# Patient Record
Sex: Female | Born: 1945
Health system: Southern US, Community
[De-identification: ages and names within clinical notes are randomized; demographics above are authoritative.]

## PROBLEM LIST (undated history)

## (undated) DIAGNOSIS — H269 Unspecified cataract: Secondary | ICD-10-CM

## (undated) DIAGNOSIS — G4730 Sleep apnea, unspecified: Secondary | ICD-10-CM

## (undated) DIAGNOSIS — E559 Vitamin D deficiency, unspecified: Secondary | ICD-10-CM

## (undated) DIAGNOSIS — E039 Hypothyroidism, unspecified: Secondary | ICD-10-CM

## (undated) DIAGNOSIS — I8390 Asymptomatic varicose veins of unspecified lower extremity: Secondary | ICD-10-CM

## (undated) DIAGNOSIS — K649 Unspecified hemorrhoids: Secondary | ICD-10-CM

## (undated) DIAGNOSIS — M1990 Unspecified osteoarthritis, unspecified site: Secondary | ICD-10-CM

## (undated) DIAGNOSIS — D649 Anemia, unspecified: Secondary | ICD-10-CM

## (undated) DIAGNOSIS — I10 Essential (primary) hypertension: Secondary | ICD-10-CM

## (undated) DIAGNOSIS — E785 Hyperlipidemia, unspecified: Secondary | ICD-10-CM

## (undated) DIAGNOSIS — K253 Acute gastric ulcer without hemorrhage or perforation: Secondary | ICD-10-CM

## (undated) DIAGNOSIS — Z8679 Personal history of other diseases of the circulatory system: Secondary | ICD-10-CM

## (undated) DIAGNOSIS — F32A Depression, unspecified: Secondary | ICD-10-CM

## (undated) DIAGNOSIS — Z8711 Personal history of peptic ulcer disease: Secondary | ICD-10-CM

## (undated) DIAGNOSIS — T4145XA Adverse effect of unspecified anesthetic, initial encounter: Secondary | ICD-10-CM

## (undated) DIAGNOSIS — M2550 Pain in unspecified joint: Secondary | ICD-10-CM

## (undated) DIAGNOSIS — R609 Edema, unspecified: Secondary | ICD-10-CM

## (undated) DIAGNOSIS — M549 Dorsalgia, unspecified: Secondary | ICD-10-CM

## (undated) DIAGNOSIS — K219 Gastro-esophageal reflux disease without esophagitis: Secondary | ICD-10-CM

## (undated) DIAGNOSIS — Z9289 Personal history of other medical treatment: Secondary | ICD-10-CM

## (undated) DIAGNOSIS — F329 Major depressive disorder, single episode, unspecified: Secondary | ICD-10-CM

## (undated) DIAGNOSIS — T8859XA Other complications of anesthesia, initial encounter: Secondary | ICD-10-CM

## (undated) DIAGNOSIS — R0602 Shortness of breath: Secondary | ICD-10-CM

## (undated) DIAGNOSIS — M810 Age-related osteoporosis without current pathological fracture: Secondary | ICD-10-CM

## (undated) HISTORY — DX: Hypothyroidism, unspecified: E03.9

## (undated) HISTORY — DX: Essential (primary) hypertension: I10

## (undated) HISTORY — DX: Personal history of peptic ulcer disease: Z87.11

## (undated) HISTORY — PX: COLONOSCOPY W/ POLYPECTOMY: SHX1380

## (undated) HISTORY — DX: Edema, unspecified: R60.9

## (undated) HISTORY — DX: Shortness of breath: R06.02

## (undated) HISTORY — DX: Acute gastric ulcer without hemorrhage or perforation: K25.3

## (undated) HISTORY — DX: Unspecified cataract: H26.9

## (undated) HISTORY — DX: Dorsalgia, unspecified: M54.9

## (undated) HISTORY — PX: HAMMER TOE SURGERY: SHX385

## (undated) HISTORY — PX: BIOPSY BREAST: PRO8

## (undated) HISTORY — DX: Vitamin D deficiency, unspecified: E55.9

## (undated) HISTORY — DX: Unspecified hemorrhoids: K64.9

## (undated) HISTORY — PX: TONSILLECTOMY AND ADENOIDECTOMY: SUR1326

## (undated) HISTORY — DX: Sleep apnea, unspecified: G47.30

## (undated) HISTORY — PX: CLOSED REDUCTION TOE FRACTURE: SUR248

## (undated) HISTORY — PX: WISDOM TOOTH EXTRACTION: SHX21

## (undated) HISTORY — PX: JOINT REPLACEMENT: SHX530

## (undated) HISTORY — DX: Personal history of other diseases of the circulatory system: Z86.79

## (undated) HISTORY — DX: Hyperlipidemia, unspecified: E78.5

## (undated) HISTORY — DX: Age-related osteoporosis without current pathological fracture: M81.0

## (undated) HISTORY — DX: Pain in unspecified joint: M25.50

---

## 2002-01-09 ENCOUNTER — Encounter: Admission: RE | Admit: 2002-01-09 | Discharge: 2002-01-09 | Payer: Self-pay | Admitting: Internal Medicine

## 2002-01-09 ENCOUNTER — Encounter: Payer: Self-pay | Admitting: Internal Medicine

## 2005-01-12 ENCOUNTER — Ambulatory Visit: Payer: Self-pay | Admitting: Family Medicine

## 2005-03-13 ENCOUNTER — Ambulatory Visit: Payer: Self-pay | Admitting: Internal Medicine

## 2005-09-13 ENCOUNTER — Ambulatory Visit: Payer: Self-pay | Admitting: Internal Medicine

## 2005-09-21 ENCOUNTER — Ambulatory Visit: Payer: Self-pay | Admitting: Internal Medicine

## 2005-11-16 ENCOUNTER — Ambulatory Visit: Payer: Self-pay | Admitting: Internal Medicine

## 2006-03-09 ENCOUNTER — Ambulatory Visit: Payer: Self-pay | Admitting: Internal Medicine

## 2006-04-03 ENCOUNTER — Ambulatory Visit: Payer: Self-pay | Admitting: Internal Medicine

## 2006-04-09 ENCOUNTER — Ambulatory Visit: Payer: Self-pay | Admitting: Internal Medicine

## 2006-04-09 ENCOUNTER — Encounter (INDEPENDENT_AMBULATORY_CARE_PROVIDER_SITE_OTHER): Payer: Self-pay | Admitting: Specialist

## 2006-05-30 ENCOUNTER — Ambulatory Visit: Payer: Self-pay | Admitting: Internal Medicine

## 2006-11-19 ENCOUNTER — Ambulatory Visit: Payer: Self-pay | Admitting: Internal Medicine

## 2006-11-28 ENCOUNTER — Ambulatory Visit: Payer: Self-pay

## 2006-12-03 ENCOUNTER — Ambulatory Visit: Payer: Self-pay | Admitting: Internal Medicine

## 2006-12-08 ENCOUNTER — Emergency Department (HOSPITAL_COMMUNITY): Admission: EM | Admit: 2006-12-08 | Discharge: 2006-12-08 | Payer: Self-pay | Admitting: Family Medicine

## 2006-12-11 ENCOUNTER — Ambulatory Visit: Payer: Self-pay | Admitting: Internal Medicine

## 2006-12-13 ENCOUNTER — Ambulatory Visit: Payer: Self-pay

## 2006-12-18 ENCOUNTER — Ambulatory Visit: Payer: Self-pay | Admitting: Cardiology

## 2007-01-14 ENCOUNTER — Ambulatory Visit: Payer: Self-pay | Admitting: Internal Medicine

## 2007-01-14 ENCOUNTER — Ambulatory Visit: Payer: Self-pay | Admitting: Cardiology

## 2007-11-20 ENCOUNTER — Ambulatory Visit: Payer: Self-pay | Admitting: Internal Medicine

## 2007-11-20 DIAGNOSIS — K589 Irritable bowel syndrome without diarrhea: Secondary | ICD-10-CM

## 2007-11-20 DIAGNOSIS — Z8679 Personal history of other diseases of the circulatory system: Secondary | ICD-10-CM | POA: Insufficient documentation

## 2007-11-20 DIAGNOSIS — R030 Elevated blood-pressure reading, without diagnosis of hypertension: Secondary | ICD-10-CM

## 2007-11-20 DIAGNOSIS — Z86010 Personal history of colonic polyps: Secondary | ICD-10-CM

## 2007-11-20 DIAGNOSIS — E039 Hypothyroidism, unspecified: Secondary | ICD-10-CM

## 2007-11-25 ENCOUNTER — Encounter (INDEPENDENT_AMBULATORY_CARE_PROVIDER_SITE_OTHER): Payer: Self-pay | Admitting: *Deleted

## 2008-03-23 ENCOUNTER — Telehealth: Payer: Self-pay | Admitting: Internal Medicine

## 2008-10-12 ENCOUNTER — Ambulatory Visit: Payer: Self-pay | Admitting: Internal Medicine

## 2008-11-06 ENCOUNTER — Ambulatory Visit: Payer: Self-pay | Admitting: Family Medicine

## 2008-11-06 DIAGNOSIS — R21 Rash and other nonspecific skin eruption: Secondary | ICD-10-CM

## 2008-11-20 ENCOUNTER — Ambulatory Visit: Payer: Self-pay | Admitting: Family Medicine

## 2008-11-30 ENCOUNTER — Ambulatory Visit: Payer: Self-pay | Admitting: Family Medicine

## 2008-12-01 ENCOUNTER — Telehealth (INDEPENDENT_AMBULATORY_CARE_PROVIDER_SITE_OTHER): Payer: Self-pay | Admitting: *Deleted

## 2009-03-03 ENCOUNTER — Encounter (INDEPENDENT_AMBULATORY_CARE_PROVIDER_SITE_OTHER): Payer: Self-pay | Admitting: *Deleted

## 2009-03-05 ENCOUNTER — Ambulatory Visit: Payer: Self-pay | Admitting: Internal Medicine

## 2009-03-05 DIAGNOSIS — H6980 Other specified disorders of Eustachian tube, unspecified ear: Secondary | ICD-10-CM

## 2009-03-05 DIAGNOSIS — K219 Gastro-esophageal reflux disease without esophagitis: Secondary | ICD-10-CM

## 2009-03-19 ENCOUNTER — Encounter (INDEPENDENT_AMBULATORY_CARE_PROVIDER_SITE_OTHER): Payer: Self-pay | Admitting: *Deleted

## 2009-03-23 ENCOUNTER — Ambulatory Visit: Payer: Self-pay | Admitting: Internal Medicine

## 2009-03-23 DIAGNOSIS — M25569 Pain in unspecified knee: Secondary | ICD-10-CM | POA: Insufficient documentation

## 2009-05-06 ENCOUNTER — Ambulatory Visit: Payer: Self-pay | Admitting: Internal Medicine

## 2009-05-07 ENCOUNTER — Ambulatory Visit: Payer: Self-pay | Admitting: Internal Medicine

## 2009-05-08 LAB — CONVERTED CEMR LAB: TSH: 1.51 microintl units/mL (ref 0.35–5.50)

## 2009-05-10 ENCOUNTER — Encounter (INDEPENDENT_AMBULATORY_CARE_PROVIDER_SITE_OTHER): Payer: Self-pay | Admitting: *Deleted

## 2009-05-21 ENCOUNTER — Encounter: Payer: Self-pay | Admitting: Internal Medicine

## 2009-05-21 ENCOUNTER — Ambulatory Visit: Payer: Self-pay | Admitting: Internal Medicine

## 2009-05-27 ENCOUNTER — Encounter: Payer: Self-pay | Admitting: Internal Medicine

## 2009-08-12 ENCOUNTER — Ambulatory Visit: Payer: Self-pay | Admitting: Family Medicine

## 2009-10-11 ENCOUNTER — Ambulatory Visit: Payer: Self-pay | Admitting: Internal Medicine

## 2009-10-15 LAB — CONVERTED CEMR LAB: TSH: 0.08 microintl units/mL — ABNORMAL LOW (ref 0.35–5.50)

## 2009-10-18 ENCOUNTER — Encounter (INDEPENDENT_AMBULATORY_CARE_PROVIDER_SITE_OTHER): Payer: Self-pay | Admitting: *Deleted

## 2010-02-21 ENCOUNTER — Ambulatory Visit: Payer: Self-pay | Admitting: Internal Medicine

## 2010-03-01 ENCOUNTER — Ambulatory Visit: Payer: Self-pay | Admitting: Internal Medicine

## 2010-03-01 DIAGNOSIS — N959 Unspecified menopausal and perimenopausal disorder: Secondary | ICD-10-CM | POA: Insufficient documentation

## 2010-08-25 ENCOUNTER — Ambulatory Visit: Payer: Self-pay | Admitting: Internal Medicine

## 2010-08-25 DIAGNOSIS — J019 Acute sinusitis, unspecified: Secondary | ICD-10-CM | POA: Insufficient documentation

## 2010-08-26 ENCOUNTER — Telehealth (INDEPENDENT_AMBULATORY_CARE_PROVIDER_SITE_OTHER): Payer: Self-pay | Admitting: *Deleted

## 2010-11-10 ENCOUNTER — Encounter: Payer: Self-pay | Admitting: Internal Medicine

## 2010-11-20 LAB — CONVERTED CEMR LAB
ALT: 17 units/L (ref 0–35)
ALT: 20 units/L (ref 0–35)
ALT: 20 units/L (ref 0–40)
AST: 21 units/L (ref 0–37)
AST: 23 units/L (ref 0–37)
AST: 24 units/L (ref 0–37)
Albumin: 3.9 g/dL (ref 3.5–5.2)
Albumin: 4 g/dL (ref 3.5–5.2)
Alkaline Phosphatase: 62 units/L (ref 39–117)
Alkaline Phosphatase: 67 units/L (ref 39–117)
BUN: 12 mg/dL (ref 6–23)
BUN: 17 mg/dL (ref 6–23)
BUN: 19 mg/dL (ref 6–23)
Basophils Absolute: 0 10*3/uL (ref 0.0–0.1)
Basophils Absolute: 0.1 10*3/uL (ref 0.0–0.1)
Basophils Relative: 0.6 % (ref 0.0–1.0)
Basophils Relative: 0.9 % (ref 0.0–3.0)
Bilirubin, Direct: 0.1 mg/dL (ref 0.0–0.3)
Bilirubin, Direct: 0.1 mg/dL (ref 0.0–0.3)
CO2: 31 meq/L (ref 19–32)
CO2: 32 meq/L (ref 19–32)
Calcium: 9.1 mg/dL (ref 8.4–10.5)
Calcium: 9.6 mg/dL (ref 8.4–10.5)
Chloride: 105 meq/L (ref 96–112)
Chloride: 106 meq/L (ref 96–112)
Cholesterol: 187 mg/dL (ref 0–200)
Cholesterol: 197 mg/dL (ref 0–200)
Creatinine, Ser: 0.9 mg/dL (ref 0.4–1.2)
Creatinine, Ser: 1 mg/dL (ref 0.4–1.2)
Creatinine, Ser: 1.1 mg/dL (ref 0.4–1.2)
Eosinophils Absolute: 0.2 10*3/uL (ref 0.0–0.6)
Eosinophils Absolute: 0.3 10*3/uL (ref 0.0–0.7)
Eosinophils Relative: 3.5 % (ref 0.0–5.0)
Eosinophils Relative: 5.4 % — ABNORMAL HIGH (ref 0.0–5.0)
GFR calc Af Amer: 65 mL/min
GFR calc non Af Amer: 54 mL/min
GFR calc non Af Amer: 59.96 mL/min (ref >60)
Glucose, Bld: 83 mg/dL (ref 70–99)
Glucose, Bld: 86 mg/dL (ref 70–99)
Glucose, Bld: 96 mg/dL (ref 70–99)
HCT: 40.5 % (ref 36.0–46.0)
HCT: 41.3 % (ref 36.0–46.0)
HDL: 53.7 mg/dL (ref >39.0)
HDL: 61.7 mg/dL (ref >39.00)
Hemoglobin: 13.6 g/dL (ref 12.0–15.0)
Hemoglobin: 14 g/dL (ref 12.0–15.0)
LDL Cholesterol: 116 mg/dL — ABNORMAL HIGH (ref 0–99)
LDL Cholesterol: 121 mg/dL — ABNORMAL HIGH (ref 0–99)
Lymphocytes Relative: 23.5 % (ref 12.0–46.0)
Lymphocytes Relative: 25.2 % (ref 12.0–46.0)
Lymphs Abs: 1.6 10*3/uL (ref 0.7–4.0)
MCHC: 33.7 g/dL (ref 30.0–36.0)
MCHC: 33.9 g/dL (ref 30.0–36.0)
MCV: 89.4 fL (ref 78.0–100.0)
MCV: 89.6 fL (ref 78.0–100.0)
Monocytes Absolute: 0.4 10*3/uL (ref 0.2–0.7)
Monocytes Absolute: 0.6 10*3/uL (ref 0.1–1.0)
Monocytes Relative: 7.7 % (ref 3.0–11.0)
Monocytes Relative: 9.7 % (ref 3.0–12.0)
Neutro Abs: 3.3 10*3/uL (ref 1.4–7.7)
Neutro Abs: 3.7 10*3/uL (ref 1.4–7.7)
Neutrophils Relative %: 58.8 % (ref 43.0–77.0)
Neutrophils Relative %: 64.7 % (ref 43.0–77.0)
Platelets: 164 10*3/uL (ref 150.0–400.0)
Platelets: 168 10*3/uL (ref 150–400)
Potassium: 4.3 meq/L (ref 3.5–5.1)
Potassium: 4.3 meq/L (ref 3.5–5.1)
Potassium: 4.7 meq/L (ref 3.5–5.1)
RBC: 4.52 M/uL (ref 3.87–5.11)
RBC: 4.62 M/uL (ref 3.87–5.11)
RDW: 12.9 % (ref 11.5–14.6)
RDW: 13 % (ref 11.5–14.6)
Sodium: 141 meq/L (ref 135–145)
Sodium: 142 meq/L (ref 135–145)
TSH: 1.06 microintl units/mL (ref 0.35–5.50)
TSH: 4.42 microintl units/mL (ref 0.35–5.50)
TSH: 8.02 microintl units/mL — ABNORMAL HIGH (ref 0.35–5.50)
Total Bilirubin: 0.7 mg/dL (ref 0.3–1.2)
Total Bilirubin: 0.9 mg/dL (ref 0.3–1.2)
Total CHOL/HDL Ratio: 3
Total CHOL/HDL Ratio: 3.5
Total Protein: 7 g/dL (ref 6.0–8.3)
Total Protein: 7.3 g/dL (ref 6.0–8.3)
Triglycerides: 73 mg/dL (ref 0.0–149.0)
Triglycerides: 88 mg/dL (ref 0–149)
VLDL: 14.6 mg/dL (ref 0.0–40.0)
VLDL: 18 mg/dL (ref 0–40)
WBC: 5.1 10*3/uL (ref 4.5–10.5)
WBC: 6.3 10*3/uL (ref 4.5–10.5)

## 2010-11-22 NOTE — Progress Notes (Signed)
Summary: resend prescription for Hydromet  Phone Note Refill Request Message from:  Rocky Link on August 26, 2010 12:49 PM  Refills Requested: Medication #1:  HYDROMET 5-1.5 MG/5ML SYRP 1 tsp every 6 hrs as needed. patient says pharmacy did not get this prescription (they did  get prescription for Clarithromycin and patient has picked that prescription up)---[facility]  Initial call taken by: Jerolyn Shin,  August 26, 2010 12:50 PM    Prescriptions: HYDROMET 5-1.5 MG/5ML SYRP (HYDROCODONE-HOMATROPINE) 1 tsp every 6 hrs as needed  #120cc x 0   Entered by:   Shonna Chock CMA   Authorized by:   Marga Melnick MD   Signed by:   Shonna Chock CMA on 08/26/2010   Method used:   Telephoned to ...       [facility] [phone][facility] (retail)       [address]       [place], [place]  [address]       Ph: [phone]       Fax: [phone]   RxID:   [id number]

## 2010-11-22 NOTE — Assessment & Plan Note (Signed)
Summary: bad cold//ph   Vital Signs:  Patient profile:   65 year old female Height:      63.25 inches Weight:      198.2 pounds BMI:     34.96 Temp:     98.0 degrees F oral Pulse rate:   64 / minute Resp:     16 per minute BP sitting:   118 / 80  (left arm) Cuff size:   large  Vitals Entered By: Shonna Chock CMA (August 25, 2010 10:33 AM) CC: Cold sympotoms since last Friday, URI symptoms   Primary Care Provider:  Alwyn Ren  CC:  Cold sympotoms since last Friday and URI symptoms.  History of Present Illness: Onset 08/19/2010 as  nasal  congestion & malaise; cough started 10/31.   The patient reports nasal congestion, purulent nasal discharge,  minor sore throat, and productive cough, but denies earache.  The patient denies fever( but isolated chills), dyspnea, and wheezing.  The patient denies headache.  The patient denies the following risk factors for Strep sinusitis: unilateral facial pain, tooth pain, and tender adenopathy.  Rx: Robitussin   Current Medications (verified): 1)  Levothyroxine Sodium 112 Mcg .Marland Kitchen.. 1 By Mouth Once Daily Except 1&1/2 Weds Only 2)  Atenolol 25 Mg Tabs (Atenolol) .Marland Kitchen.. 1 By Mouth Once Daily  Fax # [phone] 3)  Celexa 20 Mg  Tabs (Citalopram Hydrobromide) .Marland Kitchen.. 1 1/2 By Mouth Once Daily  Allergies: 1)  ! Pcn 2)  ! Ceclor  Physical Exam  General:  well-nourished,in no acute distress; alert,appropriate and cooperative throughout examination Ears:  External ear exam shows no significant lesions or deformities.  Otoscopic examination reveals clear canals, tympanic membranes are intact bilaterally without bulging, retraction, inflammation or discharge. Hearing is grossly normal bilaterally. Nose:  External nasal examination shows no deformity or inflammation. Nasal mucosa are pink and moist without lesions or exudates. Mouth:  Oral mucosa and oropharynx without lesions or exudates.  Teeth in good repair. Minimal  petechial pharyngeal erythema.   Hoarse Lungs:  Normal respiratory effort, chest expands symmetrically. Lungs are clear to auscultation, no crackles or wheezes. Heart:  Normal rate and regular rhythm. S1 and S2 normal without gallop, murmur, click, rub or other extra sounds. Cervical Nodes:  No lymphadenopathy noted Axillary Nodes:  No palpable lymphadenopathy   Impression & Recommendations:  Problem # 1:  SINUSITIS- ACUTE-NOS (ICD-461.9)  Her updated medication list for this problem includes:    Clarithromycin 500 Mg Xr24h-tab (Clarithromycin) .Marland Kitchen... 2 once daily with a meal    Hydromet 5-1.5 Mg/59ml Syrp (Hydrocodone-homatropine) .Marland Kitchen... 1 tsp every 6 hrs as needed  Complete Medication List: 1)  Levothyroxine Sodium 112 Mcg  .Marland KitchenMarland Kitchen. 1 by mouth once daily except 1&1/2 weds only 2)  Atenolol 25 Mg Tabs (Atenolol) .Marland Kitchen.. 1 by mouth once daily  fax # [phone] 3)  Celexa 20 Mg Tabs (Citalopram hydrobromide) .Marland Kitchen.. 1 1/2 by mouth once daily 4)  Clarithromycin 500 Mg Xr24h-tab (Clarithromycin) .... 2 once daily with a meal 5)  Hydromet 5-1.5 Mg/16ml Syrp (Hydrocodone-homatropine) .Marland Kitchen.. 1 tsp every 6 hrs as needed  Patient Instructions: 1)  Neti pot once daily until sinuses are  clear . 2)  Drink as much NON dairy  fluid as you can tolerate for the next few days. Prescriptions: HYDROMET 5-1.5 MG/5ML SYRP (HYDROCODONE-HOMATROPINE) 1 tsp every 6 hrs as needed  #120cc x 0   Entered and Authorized by:   Marga Melnick MD   Signed by:   Marga Melnick MD  on 08/25/2010   Method used:   Printed then faxed to ...       [facility] [phone]* (retail)       [address]       [place], [place]  [address]       Ph: [phone]       Fax: [phone]   RxID:   [id number] CLARITHROMYCIN 500 MG XR24H-TAB (CLARITHROMYCIN) 2 once daily with a meal  #20 x 0   Entered and Authorized by:   Marga Melnick MD   Signed by:   Marga Melnick MD on 08/25/2010   Method used:   Faxed to ...       [facility]  [phone]* (retail)       [address]       [place], [place]  [address]       Ph: [phone]       Fax: [phone]   RxID:   [id number]    Orders Added: 1)  Est. Patient Level III [15176]

## 2010-11-22 NOTE — Assessment & Plan Note (Signed)
Summary: review lab/cbs   Vital Signs:  Patient profile:   65 year old female Weight:      192.4 pounds BMI:     33.67 Pulse rate:   72 / minute Resp:     15 per minute BP sitting:   122 / 80  (left arm) Cuff size:   large  Vitals Entered By: Shonna Chock (Mar 01, 2010 10:38 AM) CC: Follow-up visit: Discuss labs anfd refill Atenolol and Synthroid (local pharmacy) Comments REVIEWED MED LIST, PATIENT AGREED DOSE AND INSTRUCTION CORRECT    Primary Care Provider:  Alwyn Ren  CC:  Follow-up visit: Discuss labs anfd refill Atenolol and Synthroid (local pharmacy).  History of Present Illness: With [employer] she has had 25# weight loss over 10 weeks. TSH: 0.08 in 09/2009; TSH 1.03 on 02/21/2010. Thyroid dose verified as 112 micrograms once daily 1& 1/2 every Weds. Generic form started last week.Not on Ca++ or vitamin D.BMD last done ?  Allergies: 1)  ! Pcn 2)  ! Ceclor  Review of Systems General:  Complains of fatigue; Minor fatigue. CV:  Complains of lightheadness; denies palpitations; Occasional lightheadedness, non postural. No  neuro or cardiac triggers. GI:  Denies constipation and diarrhea. Derm:  Denies changes in nail beds, dryness, and hair loss. Neuro:  Denies numbness and tingling. Endo:  Denies cold intolerance and heat intolerance.  Physical Exam  General:  well-nourished; alert,appropriate and cooperative throughout examination Eyes:  No corneal or conjunctival inflammation noted.No lid lag.Perrla.  Neck:  No deformities, masses, or tenderness noted. Heart:  Normal rate and regular rhythm. S1 and S2 normal without gallop, murmur, click, rub .S4 Extremities:  No clubbing, cyanosis, edema. No nail changes  Neurologic:  alert & oriented X3, gait normal, and DTRs symmetrical and normal.  No tremor Skin:  Intact without suspicious lesions or rashes Psych:  memory intact for recent and remote, normally interactive, and good eye contact.     Impression &  Recommendations:  Problem # 1:  HYPOTHYROIDISM (ICD-244.9)  Her updated medication list for this problem includes:  Levothyroxine sodium...Marland KitchenMarland Kitchen 1 by mouth once daily except 1&1/2 weds only   Complete Medication List: 1)  Synthroid 112 Mcg Tabs (Levothyroxine sodium) .Marland Kitchen.. 1 by mouth once daily except 1&1/2 weds only (labs due) 2)  Atenolol 25 Mg Tabs (Atenolol) .Marland Kitchen.. 1 by mouth once daily  fax # [phone] 3)  Celexa 20 Mg Tabs (Citalopram hydrobromide) .Marland Kitchen.. 1 1/2 by mouth once daily  Patient Instructions: 1)  Calcium 600 mg two times a day & vitamin D3 1000 International Units once daily. Check vitamin D level in 6 months with TSH. BMD every 25 months @ Gyn office. (Codes : 244.9,627.9)

## 2010-11-30 NOTE — Letter (Signed)
Summary: [facility]  [facility]   Imported By: Lanelle Bal 11/23/2010 10:54:28  _____________________________________________________________________  External Attachment:    Type:   Image     Comment:   External Document

## 2011-02-27 ENCOUNTER — Other Ambulatory Visit: Payer: Self-pay

## 2011-02-27 MED ORDER — LEVOTHYROXINE SODIUM 112 MCG PO TABS: 112.0000 ug | ORAL_TABLET | ORAL | Status: DC

## 2011-02-27 NOTE — Telephone Encounter (Signed)
TSH 244.9, VIT D 627.9

## 2011-02-28 ENCOUNTER — Other Ambulatory Visit: Payer: Self-pay

## 2011-02-28 MED ORDER — ATENOLOL 25 MG PO TABS: 25.0000 mg | ORAL_TABLET | Freq: Every day | ORAL | Status: DC

## 2011-03-10 NOTE — Assessment & Plan Note (Signed)
[facility]                                 ON-CALL NOTE   Kristine Olson, Kristine Olson                     MRN:          [id number]  DATE:12/08/2006                            DOB:          [birth year]    CALLER:  Kristine Olson, the daughter.   PRIMARY CARE PHYSICIAN:  Dr. Alwyn Ren.   SUBJECTIVE:  Lightheaded, cold sweats, slight pain in base of neck and  elevated blood pressure, slight shortness of breath.   ASSESSMENT AND PLAN:  Saturday clinic closed.  Go to Urgent Care for  evaluation.  This note was triaged by Saturday clinic nurse.     Kerby Nora, MD  Electronically Signed    AB/MedQ  DD: 12/09/2006  DT: 12/09/2006  Job #: [id number]
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
## 2011-03-10 NOTE — Assessment & Plan Note (Signed)
[facility]                            CARDIOLOGY OFFICE NOTE   Kristine Olson, Kristine Olson                   MRN:          [id number]  DATE:01/14/2007                            DOB:          16-Dec-1945    PRIMARY CARE PHYSICIAN:  Dr. Alwyn Ren.   REASON FOR PRESENTATION:  Presyncope.   HISTORY OF PRESENT ILLNESS:  The patient presented in late February for  evaluation of dizzy episode as described in that note. She wore an event  monitor. She did transmit on February 21. It was a run of atrial  tachycardia. That was the only symptom that she had. She is not sure of  whether it was similar to the episode that she had on January 26. She  had had no presyncope or syncope since then. She has had no chest pain.  She has had only that one episode of palpitations. She has had no  shortness of breath, PND or orthopnea.   PAST MEDICAL HISTORY:  Tonsillectomy, breast biopsy.   ALLERGIES:  PENICILLIN, CECLOR.   MEDICATIONS:  1. Synthroid 112 mcg daily.  2. Atenolol 25 mg daily.  3. Celexa 30 mg daily.   REVIEW OF SYSTEMS:  As stated in the HPI. Otherwise, negative for other  systems.   PHYSICAL EXAMINATION:  GENERAL:  The patient is in no distress.  VITAL SIGNS:  Blood pressure 124/84, heart rate 61 and regular, weight  197 pounds, BMI 34.  HEENT:  Eyes unremarkable. Pupils equal, round, and react to light.  Fundi not visualized. Oral mucosa unremarkable.  NECK:  No jugular venous distention at 45 degrees. Carotid upstroke  brisk and symmetric. No bruits, no thyromegaly.  LYMPHATICS:  No adenopathy.  LUNGS:  Clear to auscultation bilaterally.  BACK:  No costovertebral angle tenderness.  CHEST:  Unremarkable.  HEART:  PMI not displaced or sustained. S1 and S2 within normal limits.  No S3, no S4. No clicks, no rubs, no murmurs.  ABDOMEN:  Flat, positive bowel sounds normal in frequency and pitch. No  bruits, no rebound. No guarding or midline pulsatile  mass. No  organomegaly.  SKIN:  No rashes.  EXTREMITIES:  2+ pulses, no edema.   ASSESSMENT/PLAN:  1. Palpitations. The patient did have documented atrial tachycardia of      about 8 beats. It is not clear to me that these are the symptoms      that she had in late January. Regardless, she is not having any      further presyncope and has had no syncope. She is not having any      palpitations. She has had normal electrolytes. She had normal      stress test. At this point no further cardiovascular testing is      suggested unless she has recurrent symptoms. She and I discussed      this at length.  2. Followup will be as needed.     Rollene Rotunda, MD, [facility]  Electronically Signed    JH/MedQ  DD: 01/14/2007  DT: 01/14/2007  Job #: [id number]   cc:   Titus Dubin. Alwyn Ren, MD,FACP,FCCP

## 2011-03-28 ENCOUNTER — Telehealth: Payer: Self-pay | Admitting: Internal Medicine

## 2011-03-28 MED ORDER — LEVOTHYROXINE SODIUM 112 MCG PO TABS: 112.0000 ug | ORAL_TABLET | ORAL | Status: DC

## 2011-03-28 NOTE — Telephone Encounter (Signed)
Please contact patient for Lab appointment TSH 244.9, this is necessary to continue refilling thyroid meds. Patient should also consider scheduling a CPX (yearly follow-up)

## 2011-04-05 NOTE — Telephone Encounter (Signed)
Patient has lab appt for TSH on 04/07/2011     and CPX for 07/06/2011

## 2011-04-07 ENCOUNTER — Other Ambulatory Visit: Payer: Self-pay

## 2011-05-02 ENCOUNTER — Other Ambulatory Visit: Payer: Self-pay | Admitting: Internal Medicine

## 2011-05-02 MED ORDER — LEVOTHYROXINE SODIUM 112 MCG PO TABS: 112.0000 ug | ORAL_TABLET | ORAL | Status: DC

## 2011-05-02 NOTE — Telephone Encounter (Signed)
rx sent to pharmacy

## 2011-07-06 ENCOUNTER — Ambulatory Visit (INDEPENDENT_AMBULATORY_CARE_PROVIDER_SITE_OTHER): Payer: BC Managed Care – PPO | Admitting: Internal Medicine

## 2011-07-06 ENCOUNTER — Encounter: Payer: Self-pay | Admitting: Internal Medicine

## 2011-07-06 VITALS — BP 122/86 | HR 75 | Temp 98.4°F | Resp 12 | Ht 63.25 in | Wt 209.8 lb

## 2011-07-06 DIAGNOSIS — E039 Hypothyroidism, unspecified: Secondary | ICD-10-CM

## 2011-07-06 DIAGNOSIS — K589 Irritable bowel syndrome without diarrhea: Secondary | ICD-10-CM

## 2011-07-06 DIAGNOSIS — E785 Hyperlipidemia, unspecified: Secondary | ICD-10-CM | POA: Insufficient documentation

## 2011-07-06 DIAGNOSIS — K219 Gastro-esophageal reflux disease without esophagitis: Secondary | ICD-10-CM

## 2011-07-06 DIAGNOSIS — Z0000 Encounter for general adult medical examination without abnormal findings: Secondary | ICD-10-CM

## 2011-07-06 DIAGNOSIS — R030 Elevated blood-pressure reading, without diagnosis of hypertension: Secondary | ICD-10-CM

## 2011-07-06 DIAGNOSIS — M8580 Other specified disorders of bone density and structure, unspecified site: Secondary | ICD-10-CM | POA: Insufficient documentation

## 2011-07-06 DIAGNOSIS — M899 Disorder of bone, unspecified: Secondary | ICD-10-CM

## 2011-07-06 NOTE — Progress Notes (Signed)
Subjective:    Patient ID: Kristine Olson, female    DOB: [birth year], 65 y.o.   MRN: [id number]  HPI  [name]  is here for a physical;acute issues include L elbow pain & a skin lesion.      Review of Systems she questions a pustular type lesion over her posterior thorax, present for several months possibly. This is associated with a "drawing and itching sensation". She is concerned because her brother had melanoma  Approximately 10 days ago she experienced a daily pain at the left elbow. She questions whether she had been stung by some vector . Since that time she is pain of the left elbow with wringing motions or lifting.    Objective:   Physical Exam Gen.: Healthy and well-nourished in appearance. Alert, appropriate and cooperative throughout exam. Head: Normocephalic without obvious abnormalities Eyes: No corneal or conjunctival inflammation noted. Pupils equal round reactive to light and accommodation. Fundal exam is benign without hemorrhages, exudate, papilledema. Extraocular motion intact. Vision grossly normal with lenses. Ears: External  ear exam reveals no significant lesions or deformities. Canals clear .TMs normal. Hearing is grossly normal bilaterally. Nose: External nasal exam reveals no deformity or inflammation. Nasal mucosa are pink and moist. No lesions or exudates noted.  Mouth: Oral mucosa and oropharynx reveal no lesions or exudates. Teeth in good repair. Neck: No deformities, masses, or tenderness noted. Range of motion &. Thyroid normal. Lungs: Normal respiratory effort; chest expands symmetrically. Lungs are clear to auscultation without rales, wheezes, or increased work of breathing. Heart: Normal rate and rhythm. Normal S1 and S2. No gallop, click, or rub. S4 with slurring; no  murmur. Abdomen: Bowel sounds normal; abdomen soft and nontender. No masses, organomegaly or hernias noted. Genitalia:  [facility] [place] [facility] [place]   .                                                                                    Musculoskeletal/extremities: No deformity or scoliosis noted of  the thoracic or lumbar spine. No clubbing, cyanosis, edema, or deformity noted. Range of motion  normal .Tone & strength  normal.Joints normal. Nail health  good. She has classic tenosynovitis at the left elbow Vascular: Carotid, radial artery, dorsalis pedis and  posterior tibial pulses are full and equal. No bruits present. Neurologic: Alert and oriented x3. Deep tendon reflexes symmetrical and normal.          Skin: Intact without suspicious lesions or rashes. The lesion on the back is applied or with a "black". There is no evidence of melanoma or malignant change. Lymph: No cervical, axillary lymphadenopathy present. Psych: Mood and affect are normal. Normally interactive  Assessment & Plan:  #1 comprehensive physical exam; no acute findings #2 see Problem List with Assessments & Recommendations  #3 inspissated pore upper back  #4 classic ulnar tenosynovitis left Plan: see Orders   EKG reveals diffuse minor nonspecific ST-T wave changes. These are stable to improved, especially in the limb leads compared to 03/05/2009.

## 2011-07-06 NOTE — Patient Instructions (Addendum)
Preventive Health Care: Eat a low-fat diet with lots of fruits and vegetables, up to 7-9 servings per day. Consume less than 30 grams of sugar per day from foods & drinks with High Fructose Corn Syrup as # 1,2,3 or #4 on label. Use an anti-inflammatory cream such as Aspercreme or Zostrix cream twice a day to the left elbow after stretching  exercises . In lieu of this warm moist compresses or  hot water bottle can be used. Do not apply ice to this area.Consider glucosamine sulfate 1500 mg daily for joint symptoms. Take this daily  for 3 months and then leave it off for 2 months. This will rehydrate the cartilages & tendons.  Recommended lifestyle interventions for Osteoporosis include calcium 600 mg twice a day  & vitamin D3 supplementation to keep vit D  level @ least 40-60. The usual vitamin D3 dose is 1000 IU daily; but individual dose is determined by annual vitamin D level monitor. Also weight bearing exercise such as  walking 30-45 minutes 3-4  X per week is recommended.   Please  schedule fasting Labs : BMET,Lipids, hepatic panel, CBC & dif, TSH,vitamin D level (V70.0, 733.90)

## 2011-07-07 ENCOUNTER — Other Ambulatory Visit: Payer: Self-pay | Admitting: Internal Medicine

## 2011-07-07 DIAGNOSIS — M899 Disorder of bone, unspecified: Secondary | ICD-10-CM

## 2011-07-07 DIAGNOSIS — Z0000 Encounter for general adult medical examination without abnormal findings: Secondary | ICD-10-CM

## 2011-07-10 ENCOUNTER — Other Ambulatory Visit (INDEPENDENT_AMBULATORY_CARE_PROVIDER_SITE_OTHER): Payer: BC Managed Care – PPO

## 2011-07-10 DIAGNOSIS — E039 Hypothyroidism, unspecified: Secondary | ICD-10-CM

## 2011-07-10 DIAGNOSIS — M949 Disorder of cartilage, unspecified: Secondary | ICD-10-CM

## 2011-07-10 DIAGNOSIS — M899 Disorder of bone, unspecified: Secondary | ICD-10-CM

## 2011-07-10 DIAGNOSIS — Z0000 Encounter for general adult medical examination without abnormal findings: Secondary | ICD-10-CM

## 2011-07-10 LAB — LIPID PANEL
Cholesterol: 172 mg/dL (ref 0–200)
LDL Cholesterol: 98 mg/dL (ref 0–99)
VLDL: 12.8 mg/dL (ref 0.0–40.0)

## 2011-07-10 LAB — CBC WITH DIFFERENTIAL/PLATELET
Basophils Relative: 0.8 % (ref 0.0–3.0)
Eosinophils Absolute: 0.3 10*3/uL (ref 0.0–0.7)
Hemoglobin: 13.3 g/dL (ref 12.0–15.0)
Lymphocytes Relative: 23.2 % (ref 12.0–46.0)
MCHC: 33 g/dL (ref 30.0–36.0)
Monocytes Relative: 9.2 % (ref 3.0–12.0)
Neutro Abs: 3.6 10*3/uL (ref 1.4–7.7)
RBC: 4.43 Mil/uL (ref 3.87–5.11)

## 2011-07-10 LAB — HEPATIC FUNCTION PANEL
Bilirubin, Direct: 0 mg/dL (ref 0.0–0.3)
Total Protein: 7 g/dL (ref 6.0–8.3)

## 2011-07-10 LAB — BASIC METABOLIC PANEL
BUN: 19 mg/dL (ref 6–23)
CO2: 27 mEq/L (ref 19–32)
Chloride: 106 mEq/L (ref 96–112)
Glucose, Bld: 102 mg/dL — ABNORMAL HIGH (ref 70–99)
Potassium: 4.2 mEq/L (ref 3.5–5.1)

## 2011-07-10 NOTE — Progress Notes (Signed)
Labs only

## 2011-07-10 NOTE — Progress Notes (Signed)
Addended by: Legrand Como on: 07/10/2011 11:02 AM   Modules accepted: Orders

## 2011-07-20 ENCOUNTER — Telehealth: Payer: Self-pay | Admitting: Internal Medicine

## 2011-07-20 MED ORDER — LEVOTHYROXINE SODIUM 112 MCG PO TABS: 112.0000 ug | ORAL_TABLET | ORAL | Status: DC

## 2011-07-20 NOTE — Telephone Encounter (Signed)
Patient had lab several weeks ago - refill for synthroid 112( mcg generic) was going to be based on result - of lab [address]  Patient needs refill

## 2011-07-20 NOTE — Telephone Encounter (Signed)
RX sent to pharmacy  

## 2011-08-28 ENCOUNTER — Other Ambulatory Visit: Payer: Self-pay | Admitting: Internal Medicine

## 2011-08-28 MED ORDER — ATENOLOL 25 MG PO TABS: 25.0000 mg | ORAL_TABLET | Freq: Every day | ORAL | Status: DC

## 2011-08-28 NOTE — Telephone Encounter (Signed)
RX sent

## 2012-02-13 ENCOUNTER — Ambulatory Visit (INDEPENDENT_AMBULATORY_CARE_PROVIDER_SITE_OTHER): Payer: BC Managed Care – PPO | Admitting: Family Medicine

## 2012-02-13 ENCOUNTER — Encounter: Payer: Self-pay | Admitting: Family Medicine

## 2012-02-13 VITALS — BP 122/76 | HR 74 | Temp 98.2°F | Ht 63.75 in | Wt 211.0 lb

## 2012-02-13 DIAGNOSIS — M25476 Effusion, unspecified foot: Secondary | ICD-10-CM

## 2012-02-13 DIAGNOSIS — R238 Other skin changes: Secondary | ICD-10-CM

## 2012-02-13 DIAGNOSIS — L853 Xerosis cutis: Secondary | ICD-10-CM | POA: Insufficient documentation

## 2012-02-13 DIAGNOSIS — M25471 Effusion, right ankle: Secondary | ICD-10-CM | POA: Insufficient documentation

## 2012-02-13 NOTE — Progress Notes (Signed)
  Subjective:    Patient ID: Kristine Olson, female    DOB: Apr 27, 1946, 66 y.o.   MRN: [id number]  HPI Swollen ankle- R ankle, started Thursday.  Improved yesterday and today.  internet searched unilateral swelling and was concerned about blood clot.  No known injury.  Had Pedicure on Thursday, water was very hot, had vigorous foot/ankle massaging.  Swelling was localized to lateral malleolus.  Had little associated pain.  This has almost entirely resolved.  Itchy spot on back- first noticed months ago, no bug bite.  R shoulder blade.   Review of Systems For ROS see HPI     Objective:   Physical Exam  Vitals reviewed. Constitutional: She appears well-developed and well-nourished. No distress.  Musculoskeletal:       Right ankle: She exhibits swelling (mild edema of lateral malleolar bursa). She exhibits normal range of motion, no ecchymosis, no deformity and normal pulse. no tenderness.  Skin: Skin is warm and dry. No rash (area of dry skin at location in question) noted. No erythema.          Assessment & Plan:

## 2012-02-13 NOTE — Patient Instructions (Signed)
Your ankle swelling based on report was swelling of the bursa sack (bursitis) Elevation, ice and ibuprofen will help w/ this Apply hydrocortisone cream to the spot on your back twice daily Call with any questions or concerns Hang in there!!!

## 2012-02-13 NOTE — Assessment & Plan Note (Signed)
New.  Start OTC hydrocortisone cream.

## 2012-02-13 NOTE — Assessment & Plan Note (Signed)
New.  Based on hx and PE, this is consistent w/ swelling of R lateral malleolar bursa.  Reassurance provided.  Reviewed supportive care and red flags that should prompt return.  Pt expressed understanding and is in agreement w/ plan.

## 2012-07-31 ENCOUNTER — Other Ambulatory Visit: Payer: Self-pay | Admitting: Internal Medicine

## 2012-07-31 NOTE — Telephone Encounter (Signed)
OV 02/13/12, labs 07/10/11 Last filled 07/20/11 #34 x11.     PLz advise       MW

## 2012-07-31 NOTE — Telephone Encounter (Signed)
Refill #34; last labs were 9/12. Please  schedule  Labs & follow up appt :  TSH. Diagnoses /Codes: 244.9

## 2012-08-01 NOTE — Telephone Encounter (Signed)
TSH 244.9 

## 2012-08-20 ENCOUNTER — Other Ambulatory Visit (INDEPENDENT_AMBULATORY_CARE_PROVIDER_SITE_OTHER): Payer: BC Managed Care – PPO

## 2012-08-20 DIAGNOSIS — E039 Hypothyroidism, unspecified: Secondary | ICD-10-CM

## 2012-08-21 ENCOUNTER — Other Ambulatory Visit: Payer: Self-pay | Admitting: Internal Medicine

## 2012-08-23 ENCOUNTER — Ambulatory Visit (INDEPENDENT_AMBULATORY_CARE_PROVIDER_SITE_OTHER): Payer: BC Managed Care – PPO | Admitting: Internal Medicine

## 2012-08-23 ENCOUNTER — Encounter: Payer: Self-pay | Admitting: Internal Medicine

## 2012-08-23 VITALS — BP 124/82 | HR 64 | Wt 210.0 lb

## 2012-08-23 DIAGNOSIS — E559 Vitamin D deficiency, unspecified: Secondary | ICD-10-CM

## 2012-08-23 DIAGNOSIS — Z8679 Personal history of other diseases of the circulatory system: Secondary | ICD-10-CM

## 2012-08-23 DIAGNOSIS — K589 Irritable bowel syndrome without diarrhea: Secondary | ICD-10-CM

## 2012-08-23 DIAGNOSIS — R7301 Impaired fasting glucose: Secondary | ICD-10-CM

## 2012-08-23 DIAGNOSIS — E039 Hypothyroidism, unspecified: Secondary | ICD-10-CM

## 2012-08-23 MED ORDER — METOPROLOL TARTRATE 25 MG PO TABS: ORAL_TABLET | ORAL | Status: DC

## 2012-08-23 MED ORDER — LEVOTHYROXINE SODIUM 112 MCG PO TABS: ORAL_TABLET | ORAL | Status: DC

## 2012-08-23 NOTE — Assessment & Plan Note (Signed)
Take the probiotic , Align, every day until the bowels are normal. This will replace the normal bacteria which  are necessary for formation of normal stool and processing of food.

## 2012-08-23 NOTE — Assessment & Plan Note (Signed)
Atenolol was prescribed for the tachycardia; metoprolol twice a day would provide greater protection

## 2012-08-23 NOTE — Assessment & Plan Note (Signed)
Diabetic risk will be assessed with A1c

## 2012-08-23 NOTE — Assessment & Plan Note (Addendum)
On no Vitamin D supplementation Vitamin D level will be checked; minimal goal is greater than 40

## 2012-08-23 NOTE — Progress Notes (Signed)
  Subjective:    Patient ID: Kristine Olson, female    DOB: September 16, 1946, 66 y.o.   MRN: [id number]  HPI  There's been no change in the brand , dose or mode of administration of the thyroid. She expresses some frustration about  inability to lose weight. Her TSH has varied from a high of 8.02 in May 2010-0.08 in December 2010. At this time the value is excellent at 1.1     Review of Systems  Constitutional: Weight change:stable; Fatigue:no; Sleep pattern:good; Appetite:good  Visual change(blurred/diplopia/visual loss):decreasing acuity Hoarseness:with allergen exposure; Swallowing issues:no Cardiovascular: Palpitations:no; Racing:no; Irregularity:no. Beta blocker to prevent tachycardia GI: Constipation:no; Diarrhea:IBS with loose - watery stool Derm: Change in nails/hair/skin:no Neuro: Numbness/tingling:no; Tremor:no Psych: Anxiety:not significant; Depression:controlled with SSRI Endo: Temperature intolerance: Heat:no; Cold:no       Objective:   Physical Exam Gen.:  well-nourished in appearance. Alert, appropriate and cooperative throughout exam. Head: Normocephalic without obvious abnormalities Eyes: No corneal or conjunctival inflammation noted. Pupils equal round reactive to light and accommodation. No lid lag or proptosis. Extraocular motion intact.  Ears: External  ear exam reveals no significant lesions or deformities. Canals clear .TMs normal. Hearing is grossly normal bilaterally. Nose: External nasal exam reveals no deformity or inflammation. Nasal mucosa are pink and moist. No lesions or exudates noted. Mouth: Oral mucosa and oropharynx reveal no lesions or exudates. Teeth in good repair. Neck: No deformities, masses, or tenderness noted. Range of motion & Thyroid normal Lungs: Normal respiratory effort; chest expands symmetrically. Lungs are clear to auscultation without rales, wheezes, or increased work of breathing. Heart: Normal rate and rhythm. Normal S1 and S2. No  gallop, click, or rub. S4 w/o murmur. Abdomen: Bowel sounds normal; abdomen soft and nontender. No masses, organomegaly or hernias noted. Genitalia: Dr Norberto Sorenson Musculoskeletal/extremities: Mild lordosis  noted of  the thoracic  spine. No clubbing, cyanosis, edema, or deformity noted. Tone & strength  normal.Joints normal. Nail health  good. Vascular: Carotid, radial artery, dorsalis pedis and  posterior tibial pulses are full and equal. No bruits present. Neurologic: Alert and oriented x3. Deep tendon reflexes symmetrical and normal.          Skin: Intact without suspicious lesions or rashes. Lymph: No cervical, axillary lymphadenopathy present. Psych: Mood and affect are normal. Normally interactive                                                                                         Assessment & Plan:

## 2012-08-23 NOTE — Patient Instructions (Addendum)
To prevent tachycardia or premature beats, avoid stimulants such as decongestants, diet pills, nicotine, or caffeine (coffee, tea, cola, or chocolate) to excess.   If you activate My Chart; the results can be released to you as soon as they populate from the lab. If you choose not to use this program; the labs have to be reviewed, copied & mailed   causing a delay in getting the results to you.

## 2012-08-26 ENCOUNTER — Telehealth: Payer: Self-pay

## 2012-08-26 NOTE — Telephone Encounter (Signed)
Pt states starting new job that is requiring a health form be filled out, pt states had an appt 08/23/12 Friday and will drop form off Wednesday to get filled out and faxed once faxed Plz call pt to pick up original.  CB: [phone].  Pt requesting -Mail test results when available not wanting to view them on mychart.   MW

## 2012-08-29 NOTE — Telephone Encounter (Signed)
Dr.Hopper please advise, have you seen form?

## 2012-08-30 LAB — VITAMIN D 1,25 DIHYDROXY
Vitamin D2 1, 25 (OH)2: <8 pg/mL
Vitamin D3 1, 25 (OH)2: 45 pg/mL

## 2012-10-18 ENCOUNTER — Ambulatory Visit (INDEPENDENT_AMBULATORY_CARE_PROVIDER_SITE_OTHER): Payer: BC Managed Care – PPO | Admitting: Family Medicine

## 2012-10-18 ENCOUNTER — Telehealth: Payer: Self-pay | Admitting: Internal Medicine

## 2012-10-18 ENCOUNTER — Encounter: Payer: Self-pay | Admitting: Family Medicine

## 2012-10-18 VITALS — BP 130/80 | HR 78 | Temp 98.3°F | Wt 211.2 lb

## 2012-10-18 DIAGNOSIS — H8110 Benign paroxysmal vertigo, unspecified ear: Secondary | ICD-10-CM

## 2012-10-18 DIAGNOSIS — IMO0002 Reserved for concepts with insufficient information to code with codable children: Secondary | ICD-10-CM

## 2012-10-18 DIAGNOSIS — J019 Acute sinusitis, unspecified: Secondary | ICD-10-CM

## 2012-10-18 MED ORDER — SULFAMETHOXAZOLE-TRIMETHOPRIM 800-160 MG PO TABS: 1.0000 | ORAL_TABLET | Freq: Two times a day (BID) | ORAL | Status: DC

## 2012-10-18 MED ORDER — MECLIZINE HCL 50 MG PO TABS: 50.0000 mg | ORAL_TABLET | Freq: Three times a day (TID) | ORAL | Status: DC | PRN

## 2012-10-18 NOTE — Progress Notes (Signed)
  Subjective:    Patient ID: Kristine Olson, female    DOB: [birth year], 66 y.o.   MRN: [id number]  HPI URI- sxs started 1 week ago w/ dizziness w/ tilting or turning head.  + vertigo.  No nausea.  + facial pain/pressure.  No ear pain.  No fevers.  + nasal congestion.  No cough.  + sick contacts.  Has hx of vertigo previously but it always resolved spontaneously and rapidly.   Review of Systems For ROS see HPI     Objective:   Physical Exam  Vitals reviewed. Constitutional: She is oriented to person, place, and time. She appears well-developed and well-nourished. No distress.  HENT:  Head: Normocephalic and atraumatic.  Right Ear: Tympanic membrane normal.  Left Ear: Tympanic membrane normal.  Nose: Mucosal edema and rhinorrhea present. Right sinus exhibits maxillary sinus tenderness and frontal sinus tenderness. Left sinus exhibits maxillary sinus tenderness and frontal sinus tenderness.  Mouth/Throat: Uvula is midline and mucous membranes are normal. Posterior oropharyngeal erythema present. No oropharyngeal exudate.  Eyes: Conjunctivae normal and EOM are normal. Pupils are equal, round, and reactive to light.  Neck: Normal range of motion. Neck supple.  Cardiovascular: Normal rate, regular rhythm and normal heart sounds.   Pulmonary/Chest: Effort normal and breath sounds normal. No respiratory distress. She has no wheezes.  Lymphadenopathy:    She has no cervical adenopathy.  Neurological: She is alert and oriented to person, place, and time. She has normal reflexes. No cranial nerve deficit. Coordination normal.  Skin: Skin is warm and dry.          Assessment & Plan:

## 2012-10-18 NOTE — Telephone Encounter (Signed)
Patient Information:  Caller Name: Kedra  Phone: [phone]  Patient: Kristine Olson, Kristine Olson  Gender: Female  DOB: September 29, 1946  Age: 66 Years  PCP: Marga Melnick  Office Follow Up:  Does the office need to follow up with this patient?: No  Instructions For The Office: N/A  RN Note:  Per disposition see today or tomorrow in the offcie.  Symptoms  Reason For Call & Symptoms: Patient states she is having dizziness, sinus pressure, headache.  Reviewed Health History In EMR: Yes  Reviewed Medications In EMR: Yes  Reviewed Allergies In EMR: Yes  Reviewed Surgeries / Procedures: Yes  Date of Onset of Symptoms: 10/12/2012  Treatments Tried: Ibuprofen  Treatments Tried Worked: No  Guideline(s) Used:  Sinus Pain and Congestion  Disposition Per Guideline:   See Today or Tomorrow in Office  Reason For Disposition Reached:   Patient wants to be seen  Advice Given:  Reassurance:   Sinus congestion is a normal part of a cold.  Usually home treatment with nasal washes can prevent an actual bacterial sinus infection.  Here is some care advice that should help.  For a Runny Nose With Profuse Discharge:  Nasal mucus and discharge helps to wash viruses and bacteria out of the nose and sinuses.  If the skin around your nostrils gets irritated, apply a tiny amount of petroleum ointment to the nasal openings once or twice a day.  For a Stuffy Nose - Use Nasal Washes:  Introduction: Saline (salt water) nasal irrigation (nasal wash) is an effective and simple home remedy for treating stuffy nose and sinus congestion. The nose can be irrigated by pouring, spraying, or squirting salt water into the nose and then letting it run back out.  How it Helps: The salt water rinses out excess mucus, washes out any irritants (dust, allergens) that might be present, and moistens the nasal cavity.  Methods: There are several ways to perform nasal irrigation. You can use a saline nasal spray bottle  (available over-the-counter), a rubber ear syringe, a medical syringe without the needle, or a Neti Pot.  Appointment Scheduled:  10/18/2012 15:00:00 Appointment Scheduled Provider:  Sheliah Hatch.

## 2012-10-18 NOTE — Assessment & Plan Note (Signed)
New to provider.  Start abx.  Reviewed supportive care and red flags that should prompt return.  Pt expressed understanding and is in agreement w/ plan.  

## 2012-10-18 NOTE — Assessment & Plan Note (Signed)
New.  Most likely related to pt's current sinus infxn.  Start meclizine prn.  Reviewed supportive care and red flags that should prompt return.  Pt expressed understanding and is in agreement w/ plan.

## 2012-10-18 NOTE — Patient Instructions (Addendum)
This appears to be an early sinus infection Start the Bactrim twice daily- take w/ food Meclizine as needed for dizziness Change positions slowly! Drink plenty of fluids Call with any questions or concerns- particularly if not improving! Happy New Year! Have a wonderful trip!

## 2012-11-01 ENCOUNTER — Ambulatory Visit (INDEPENDENT_AMBULATORY_CARE_PROVIDER_SITE_OTHER): Payer: BC Managed Care – PPO | Admitting: Internal Medicine

## 2012-11-01 ENCOUNTER — Encounter: Payer: Self-pay | Admitting: Internal Medicine

## 2012-11-01 VITALS — BP 130/82 | HR 78 | Temp 97.3°F | Ht 63.75 in | Wt 210.0 lb

## 2012-11-01 DIAGNOSIS — J329 Chronic sinusitis, unspecified: Secondary | ICD-10-CM

## 2012-11-01 MED ORDER — LEVOFLOXACIN 500 MG PO TABS: 500.0000 mg | ORAL_TABLET | Freq: Every day | ORAL | Status: DC

## 2012-11-01 NOTE — Progress Notes (Signed)
HPI  Pt presents to the clinic today with c/o sinus infection. She saw Dr. Beverely Low back in December for the same. She was given Bactrim and ended up not really feeling any better. She is still having dizziness, nasal congestion, sore throat and a cough. She has not run fevers since last week. She has had sick contacts. She is concerned about this not resolving and she is leaving for [place] in 10 days.  Review of Systems    Past Medical History  Diagnosis Date  . Hyperlipidemia   . Hypothyroidism     Family History  Problem Relation Age of Onset  . Osteoporosis Mother   . Stroke Maternal Aunt      X 3   . Diabetes Maternal Aunt   . Heart attack Maternal Grandfather     > 70  . Melanoma Brother   . Thyroid disease Mother     History   Social History  . Marital Status: Married    Spouse Name: N/A    Number of Children: N/A  . Years of Education: N/A   Occupational History  . Not on file.   Social History Main Topics  . Smoking status: Never Smoker   . Smokeless tobacco: Not on file  . Alcohol Use: Yes     Comment:  very rarely  . Drug Use: No  . Sexually Active: Not on file   Other Topics Concern  . Not on file   Social History Narrative  . No narrative on file    Allergies  Allergen Reactions  . Cefaclor     REACTION: rash  . Penicillins     REACTION: rash     Constitutional: Positive headache, fatigue and fever. Denies abrupt weight changes.  HEENT:  Positive eye pain, pressure behind the eyes, facial pain, nasal congestion and sore throat. Denies eye redness, ear pain, ringing in the ears, wax buildup, runny nose or bloody nose. Respiratory: Positive cough and thick green sputum production. Denies difficulty breathing or shortness of breath.  Cardiovascular: Denies chest pain, chest tightness, palpitations or swelling in the hands or feet.   No other specific complaints in a complete review of systems (except as listed in HPI above).  Objective:     BP 130/82  Pulse 78  Temp 97.3 F (36.3 C) (Oral)  Ht 5' 3.75" (1.619 m)  Wt 210 lb (95.255 kg)  BMI 36.33 kg/m2  SpO2 98% Wt Readings from Last 3 Encounters:  11/01/12 210 lb (95.255 kg)  10/18/12 211 lb 3.2 oz (95.8 kg)  08/23/12 210 lb (95.255 kg)    General: Appears her stated age, well developed, well nourished in NAD. HEENT: Head: normal shape and size; Eyes: sclera white, no icterus, conjunctiva pink, PERRLA and EOMs intact; Ears: Tm's gray and intact, normal light reflex; Nose: mucosa pink and moist, septum midline; Throat/Mouth: + PND. Teeth present, mucosa pink and moist, no exudate noted, no lesions or ulcerations noted.  Neck: Mild cervical lymphadenopathy. Neck supple, trachea midline. No massses, lumps or thyromegaly present.  Cardiovascular: Normal rate and rhythm. S1,S2 noted.  No murmur, rubs or gallops noted. No JVD or BLE edema. No carotid bruits noted. Pulmonary/Chest: Normal effort and positive vesicular breath sounds. No respiratory distress. No wheezes, rales or ronchi noted.      Assessment & Plan:   Acute bacterial sinusitis  Can use a Neti Pot which can be purchased from your local drug store. Flonase 2 sprays each nostril for 3 days and  then as needed. Levaquin x 7 days Zyrtec as needed  RTC as needed or if symptoms persist.

## 2012-11-01 NOTE — Patient Instructions (Addendum)

## 2013-01-13 ENCOUNTER — Telehealth: Payer: Self-pay | Admitting: Internal Medicine

## 2013-01-13 ENCOUNTER — Ambulatory Visit (INDEPENDENT_AMBULATORY_CARE_PROVIDER_SITE_OTHER): Payer: BC Managed Care – PPO | Admitting: Internal Medicine

## 2013-01-13 ENCOUNTER — Encounter: Payer: Self-pay | Admitting: Internal Medicine

## 2013-01-13 VITALS — BP 132/94 | HR 81 | Temp 98.0°F | Wt 209.0 lb

## 2013-01-13 DIAGNOSIS — J209 Acute bronchitis, unspecified: Secondary | ICD-10-CM

## 2013-01-13 DIAGNOSIS — J019 Acute sinusitis, unspecified: Secondary | ICD-10-CM

## 2013-01-13 MED ORDER — FLUTICASONE PROPIONATE 50 MCG/ACT NA SUSP: 1.0000 | Freq: Two times a day (BID) | NASAL | Status: DC | PRN

## 2013-01-13 MED ORDER — FLUTICASONE-SALMETEROL 250-50 MCG/DOSE IN AEPB: 1.0000 | INHALATION_SPRAY | Freq: Two times a day (BID) | RESPIRATORY_TRACT | Status: DC

## 2013-01-13 MED ORDER — SULFAMETHOXAZOLE-TRIMETHOPRIM 800-160 MG PO TABS: 1.0000 | ORAL_TABLET | Freq: Two times a day (BID) | ORAL | Status: DC

## 2013-01-13 NOTE — Telephone Encounter (Signed)
Patient has an appointment this morning  [facility] Triage Call Report Triage Record Num: [id number] Operator: Tomasita Crumble Patient Name: Kristine Olson Call Date & Time: 01/11/2013 12:26:52PM Patient Phone: [phone] PCP: Marga Melnick Patient Gender: Female PCP Fax : [phone] Patient DOB: 15-Jan-1946 Practice Name: [facility] Reason for Call: Caller: Paul/Spouse; PCP: Marga Melnick; CB#: [phone]; Call regarding Upper Respiratory Symptoms; Onset 01/06/13. Afebrile/tactile. Yellow green drainage down back of throat. Emergent symptoms ruled out. Caller states it would take at least 25-30 minutes to get to [place] office. Advised [facility] for evaluation per URI protocol. Caller uncertain if they will do that or wait to see PCP on 3/24. Reinforced need to see provider within 24 hours. Protocol(s) Used: Upper Respiratory Infection (URI) Recommended Outcome per Protocol: See Provider within 24 hours Reason for Outcome: Pressure/pain above or below eyes, near ears over sinuses (may also be described as fullness, worsens when bending forward, teeth or eye pain) AND yellow-green drainage from nose or down back of throat. Care Advice: ~ SYMPTOM / CONDITION MANAGEMENT

## 2013-01-13 NOTE — Telephone Encounter (Signed)
Noted, patient with pending appointment today to see Hopp, per Hopp's protocol if patient with pending appointment ok to close encounter

## 2013-01-13 NOTE — Patient Instructions (Addendum)

## 2013-01-13 NOTE — Progress Notes (Signed)
  Subjective:    Patient ID: Kristine Olson, female    DOB: 22-Sep-1946, 67 y.o.   MRN: [id number]  HPI The respiratory tract symptoms began  01/04/13 as sneezing,sore throat, rhinitis, & head congestion. Exposures reported  to  2 sick grandchildren & ? pollen as environmental  trigger .  Significant active  associated symptoms include dental pain & nasal purulence. Cough is associated with yellow  sputum production ?from PND . Wheezing  & dyspnea not reported.  Some chills present  . Intermittent extrinsic symptoms of itchy/ watery eyes     Treatment with OTC antihistamines,  NSAIDS, &   Robitussin DM  was partially effective. There is no history of asthma. PMH of  seasonal .  The patient had never smoked                 Review of Systems Symptoms not present include frontal headache,  facial pain, earache, &  otic discharge. Fever& sweats were not present .    Myalgias and arthralgias were not present. Flu shot current       Objective:   Physical Exam  General appearance:good health ;well nourished; no acute distress or increased work of breathing is present.   Eyes: No conjunctival inflammation or lid edema is present.  Ears:  External ear exam shows no significant lesions or deformities.  Otoscopic examination reveals clear canals, tympanic membranes are intact bilaterally without bulging, retraction, inflammation or discharge. Nose:  External nasal examination shows no deformity or inflammation. Nasal mucosa are pink and moist without lesions or exudates. No septal dislocation or deviation.No obstruction to airflow.  Oral exam: Dental hygiene is good; lips and gums are healthy appearing.There is no oropharyngeal erythema or exudate noted. Hyponasal speech Neck:  No deformities, masses, or tenderness noted.   Supple with full range of motion without pain.  Heart:  Normal rate and regular rhythm. S1 and S2 normal without gallop, click, rub or murmur.   Lungs:  Diffuse, low grade musical wheezing diffusely; this is greatest at the bases.No increased work of breathing.   Extremities:  No cyanosis, edema, or clubbing  noted  No  lymphadenopathy about the head, neck, or axilla noted.  Skin: Warm & dry           Assessment & Plan:  #1 rhinosinusitis with component of asthmatic bronchitis  Plan: Nasal hygiene interventions discussed. See prescription medications

## 2013-01-23 ENCOUNTER — Ambulatory Visit (INDEPENDENT_AMBULATORY_CARE_PROVIDER_SITE_OTHER): Payer: BC Managed Care – PPO | Admitting: Internal Medicine

## 2013-01-23 ENCOUNTER — Encounter: Payer: Self-pay | Admitting: Internal Medicine

## 2013-01-23 VITALS — BP 128/82 | HR 86 | Temp 98.1°F | Wt 208.0 lb

## 2013-01-23 DIAGNOSIS — L270 Generalized skin eruption due to drugs and medicaments taken internally: Secondary | ICD-10-CM

## 2013-01-23 MED ORDER — PREDNISONE 20 MG PO TABS: 20.0000 mg | ORAL_TABLET | Freq: Two times a day (BID) | ORAL | Status: DC

## 2013-01-23 MED ORDER — HYDROXYZINE HCL 10 MG PO TABS: 10.0000 mg | ORAL_TABLET | Freq: Four times a day (QID) | ORAL | Status: DC | PRN

## 2013-01-23 NOTE — Patient Instructions (Addendum)
Dip gauze in   saline and applied to the rash twice a day. The saline can be purchased at the drugstore or you can make your own .Boil cup of salt in a gallon of water. Store mixture  in a clean container.Report Warning  signs as discussed (red streaks, pus, fever, increasing pain). Apply Cort Aid OTC twice a day to the involved tissues. Do not get this topical steroid into eyes. Use hypoallergenic cleansing motions.

## 2013-01-23 NOTE — Progress Notes (Signed)
  Subjective:    Patient ID: Kristine Olson, female    DOB: 1945-11-01, 67 y.o.   MRN: [id number]  HPI  She was seen in 01/13/13 for rhinosinusitis; generic Septra DS and Zyrtec were prescribed along with fluticasone, Advair, and nasal hygiene. After 9 days with sulfa she broke out in an intensely pruritic rash over the shins.  She has a history of significant allergic reaction to cefaclor as well as penicillin.  The sinus symptoms have improved significantly with therapy.    Review of Systems  She had been provided a sample of Advair because of some reactive airways findings on exam. She felt it this made her "feel weird" & caused flushing. She stopped it after only a few inhalations.No PMH of asthma  At this time she denies frontal headache, facial pain, nasal purulence, dental pain, sore throat, otic pain, otic discharge.     Objective:   Physical Exam General appearance:good health ;well nourished; no acute distress or increased work of breathing is present.  No  lymphadenopathy about the head, neck, or axilla noted.   Eyes: No conjunctival inflammation or lid edema is present. There is no scleral icterus.  Ears:  External ear exam shows no significant lesions or deformities.  Otoscopic examination reveals clear canals, tympanic membranes are intact bilaterally without bulging or retraction. Some inflammation suggested.  Nose:  External nasal examination shows no deformity or inflammation. Nasal mucosa are pink and moist without lesions or exudates. No septal dislocation or deviation.No obstruction to airflow.   Oral exam: Dental hygiene is good; lips and gums are healthy appearing.There is no oropharyngeal erythema or exudate noted.   Neck:  No deformities, masses, or tenderness noted.      Heart:  Normal rate and regular rhythm. S1 and S2 normal without gallop, murmur, click, rub or other extra sounds.   Lungs:Chest clear to auscultation; no wheezes, rhonchi,rales ,or rubs  present.No increased work of breathing.    Extremities:  No cyanosis, edema, or clubbing  noted    Skin: Warm & dry . She has a diffuse irregular papular, erythematous rash worse over the lower legs especially the shins. There is faint manifestations over the forearms suggested         Assessment & Plan:  #1 pruritic rash, most likely related to the sulfa. It's very unlikely that this would be caused by Zyrtec.  Plan: See orders recommendations

## 2013-02-01 ENCOUNTER — Ambulatory Visit (INDEPENDENT_AMBULATORY_CARE_PROVIDER_SITE_OTHER): Payer: BC Managed Care – PPO | Admitting: Family Medicine

## 2013-02-01 ENCOUNTER — Encounter: Payer: Self-pay | Admitting: Family Medicine

## 2013-02-01 VITALS — BP 130/82 | HR 72 | Temp 98.0°F | Ht 64.0 in | Wt 214.0 lb

## 2013-02-01 DIAGNOSIS — R05 Cough: Secondary | ICD-10-CM

## 2013-02-01 MED ORDER — GUAIFENESIN-CODEINE 100-10 MG/5ML PO SYRP: ORAL_SOLUTION | ORAL | Status: DC

## 2013-02-01 NOTE — Progress Notes (Signed)
  Subjective:     Kristine Olson is a 67 y.o. female here for evaluation of a cough. Onset of symptoms was a few days ago. Symptoms have been unchanged since that time. The cough is dry and is aggravated by reclining position. Associated symptoms include: some blood streaked sputum. Patient does not have a history of asthma. Patient does have a history of environmental allergens. Patient has not traveled recently. Patient does not have a history of smoking. Patient has not had a previous chest x-ray. Patient has not had a PPD done.  The following portions of the patient's history were reviewed and updated as appropriate: allergies, current medications, past family history, past medical history, past social history, past surgical history and problem list.  Review of Systems Pertinent items are noted in HPI.    Objective:    Oxygen saturation 94% on room air BP 130/82  Pulse 72  Temp(Src) 98 F (36.7 C) (Oral)  Ht 5\' 4"  (1.626 m)  Wt 214 lb (97.07 kg)  BMI 36.72 kg/m2  SpO2 94% General appearance: alert, cooperative, appears stated age and no distress Nose: Nares normal. Septum midline. Mucosa normal. No drainage or sinus tenderness. Throat: lips, mucosa, and tongue normal; teeth and gums normal Neck: no adenopathy, supple, symmetrical, trachea midline and thyroid not enlarged, symmetric, no tenderness/mass/nodules Lungs: clear to auscultation bilaterally    Assessment:    URI with Post Nasal Drip    Plan:    Explained lack of efficacy of antibiotics in viral disease. Antitussives per medication orders. Avoid exposure to tobacco smoke and fumes. Call if shortness of breath worsens, blood in sputum, change in character of cough, development of fever or chills, inability to maintain nutrition and hydration. Avoid exposure to tobacco smoke and fumes. Steroid inhaler as ordered. Trial of antihistamines. Trial of steroid nasal spray.

## 2013-02-01 NOTE — Patient Instructions (Signed)

## 2013-02-03 ENCOUNTER — Telehealth: Payer: Self-pay | Admitting: Internal Medicine

## 2013-02-03 NOTE — Telephone Encounter (Signed)
[facility] Triage Call Report Triage Record Num: [id number] Operator: Valene Bors Patient Name: Kristine Olson Call Date & Time: 02/01/2013 8:01:20AM Patient Phone: [phone] PCP: Marga Melnick Patient Gender: Female PCP Fax : [phone] Patient DOB: 1946/05/17 Practice Name: [facility] Reason for Call: Caller: Mahi/Patient; PCP: Marga Melnick; CB#: [phone]; Call regarding Cough for past few weeks and this morning had blood in mucos with cough; Was seen in office on 01/23/13 for rash after taking Bactrim and started on Prednisone and rash now better. Also taking Robitussin DM, Zyrtec, and nasal spray on and off over past few weeks. She was dx with Bronchitis and Sinusitis and symptoms were better. Cough started again on 01/30/13. Afebrile. Having coughing spells and voice hoarse - today mucos blood tinged- 02/01/13. Triage and Care advice per Cough- Adult Protocol and MD on call contacted, Dr. Laury Axon. She recommended appointment in [place] office. Scheduled for 10:15- 02/01/13. Protocol(s) Used: Cough - Adult Recommended Outcome per Protocol: Call Provider within 8 Hours Reason for Outcome: Being treated by a provider for a secondary infection AND no improvement in symptoms, symptoms have worsened OR has new symptoms after following treatment plan for the time specified by provider. Care Advice: ~ Avoid any activity that produces symptoms until evaluated by provider. ~ Continue to follow treatment plan, including medications, until evaluated by provider. Go to ED IMMEDIATELY if developing increased shortness of breath, continuous cough, worsening fatigue, or unable to perform ADLs. ~ Drink more fluids -- water, low-sugar juices, tea and warm soup, especially chicken broth, are options. Avoid caffeinated or alcoholic beverages because they can increase the chance of dehydration. ~ ~ SYMPTOM / CONDITION MANAGEMENT ~ CAUTIONS Total water intake  includes drinking water, water in beverages, and water contained in food. Fluids make up about 80% of the body's total hydration need. Individual fluid requirement to maintain hydration vary based on physical activity, environmental factors and illness. Limit fluids that contain sugar, caffeine, or alcohol. Urine will be very light yellow color when you drink enough fluids. ~ 02/01/2013 8:24:32AM Page 1 of 1 CAN_

## 2013-02-03 NOTE — Telephone Encounter (Signed)
Patient returned phone call. Best # [phone]

## 2013-02-03 NOTE — Telephone Encounter (Signed)
Recommend CXR @ [facility] the F/U appt

## 2013-02-03 NOTE — Telephone Encounter (Signed)
Pt states that she was seen on sat after call at Sat clinic by Dr Laury Axon would you like for Pt to still have x-ray done and f/u with you. Marland KitchenPlease advise

## 2013-02-03 NOTE — Telephone Encounter (Signed)
Left message to call office

## 2013-02-03 NOTE — Telephone Encounter (Signed)
If she continues to have cough especially with chest congestion, shortness of breath,or wheezing; I recommend a chest x-ray with followup office visit.

## 2013-02-04 NOTE — Telephone Encounter (Signed)
Discuss with patient  

## 2013-02-28 ENCOUNTER — Ambulatory Visit (INDEPENDENT_AMBULATORY_CARE_PROVIDER_SITE_OTHER): Payer: BC Managed Care – PPO | Admitting: Internal Medicine

## 2013-02-28 ENCOUNTER — Encounter: Payer: Self-pay | Admitting: Internal Medicine

## 2013-02-28 VITALS — BP 128/90 | HR 92 | Temp 98.3°F | Wt 218.0 lb

## 2013-02-28 DIAGNOSIS — G4730 Sleep apnea, unspecified: Secondary | ICD-10-CM

## 2013-02-28 DIAGNOSIS — I479 Paroxysmal tachycardia, unspecified: Secondary | ICD-10-CM

## 2013-02-28 DIAGNOSIS — R0683 Snoring: Secondary | ICD-10-CM

## 2013-02-28 DIAGNOSIS — I10 Essential (primary) hypertension: Secondary | ICD-10-CM

## 2013-02-28 DIAGNOSIS — R609 Edema, unspecified: Secondary | ICD-10-CM

## 2013-02-28 DIAGNOSIS — R0609 Other forms of dyspnea: Secondary | ICD-10-CM

## 2013-02-28 DIAGNOSIS — R600 Localized edema: Secondary | ICD-10-CM

## 2013-02-28 NOTE — Progress Notes (Signed)
  Subjective:    Patient ID: Kristine Olson, female    DOB: 11/03/1945, 67 y.o.   MRN: [id number]  HPI She noted edema of her ankles 02/26/13 in the context of having worked at [place] 5[place]6. This involved her sitting or standing for several hours during a 14 hour. She also did ingest some salted nuts during that period  She is not monitoring her blood pressure at home as she states "it hasn't felt abnormal". She is not on amlodipine.    Review of Systems She continues to have intermittent brief palpitations which she can reverse by coughing  She denies dyspnea, claudication, or paroxysmal nocturnal dyspnea.  Her husband reports severe snoring and apneic spells.     Objective:   Physical Exam Appears healthy and well-nourished & in no acute distress  Extraocular motion is intact; she has no proptosis  Nares are patent  Oropharynx is crowded. Dental hygiene is excellent  No carotid bruits are present.No neck pain distention present at 10 - 15 degrees. Thyroid normal to palpation  Heart rhythm and rate are normal with no significant murmurs or gallops.S4  Chest is clear with no increased work of breathing  There is no evidence of aortic aneurysm or renal artery bruits  Abdomen soft with no organomegaly or masses. No HJR  No clubbing, cyanosis or edema present.  Pedal pulses are intact   No ischemic skin changes are present . Nails healthy ; no onycholysis  Alert and oriented. Strength, tone normal. Uterine reflexes normal. No tremor present          Assessment & Plan:  #1 edema. Hydrochlorothiazide 12.5 mg or spironolactone 25 mg will be prescribed based on pending labs  #2 history of paroxysmal tachycardia  #3 excess snoring and apnea. Sleep apnea could be the unifying cause of numbers 1,2 and #4  #4 hypertension  Plan: See orders recommendations

## 2013-02-28 NOTE — Patient Instructions (Addendum)
Minimal Blood Pressure Goal= AVERAGE < 140/90;  Ideal is an AVERAGE < 135/85. This AVERAGE should be calculated from @ least 5-7 BP readings taken @ different times of day on different days of week. You should not respond to isolated BP readings , but rather the AVERAGE for that week .Please bring your  blood pressure cuff to office visits to verify that it is reliable.It  can also be checked against the blood pressure device at the pharmacy. Finger or wrist cuffs are not dependable; an arm cuff is.  If you activate the  My Chart system; lab & Xray results will be released directly  to you as soon as I review & address these through the computer. If you choose not to sign up for My Chart within 36 hours of labs being drawn; results will be reviewed & interpretation added before being copied & mailed, causing a delay in getting the results to you.If you do not receive that report within 7-10 days ,please call. Additionally you can use this system to gain direct  access to your records  if  out of town or @ an office of a  physician who is not in  the My Chart network.  This improves continuity of care & places you in control of your medical record.  

## 2013-02-28 NOTE — Addendum Note (Signed)
Addended by: Silvio Pate D on: 02/28/2013 04:24 PM   Modules accepted: Orders

## 2013-03-01 LAB — AST: AST: 19 U/L (ref 0–37)

## 2013-03-01 LAB — BASIC METABOLIC PANEL
Calcium: 9 mg/dL (ref 8.4–10.5)
Potassium: 3.6 mEq/L (ref 3.5–5.3)
Sodium: 140 mEq/L (ref 135–145)

## 2013-03-01 LAB — TSH: TSH: 1.65 u[IU]/mL (ref 0.350–4.500)

## 2013-03-01 LAB — ALT: ALT: 16 U/L (ref 0–35)

## 2013-03-03 ENCOUNTER — Telehealth: Payer: Self-pay

## 2013-03-03 MED ORDER — SPIRONOLACTONE 25 MG PO TABS: 25.0000 mg | ORAL_TABLET | Freq: Every day | ORAL | Status: DC

## 2013-03-03 NOTE — Telephone Encounter (Signed)
Message copied by Maurice Small on Mon Mar 03, 2013  8:29 AM ------      Message from: Pecola Lawless      Created: Sat Mar 01, 2013 10:14 PM       Please send a prescription for Spironolactone 25 mg; dispense 90, daily  ------

## 2013-03-06 ENCOUNTER — Encounter: Payer: Self-pay | Admitting: Internal Medicine

## 2013-03-06 ENCOUNTER — Ambulatory Visit (INDEPENDENT_AMBULATORY_CARE_PROVIDER_SITE_OTHER): Payer: BC Managed Care – PPO | Admitting: Internal Medicine

## 2013-03-06 VITALS — BP 146/98 | HR 82 | Temp 98.2°F | Wt 217.4 lb

## 2013-03-06 DIAGNOSIS — I10 Essential (primary) hypertension: Secondary | ICD-10-CM | POA: Insufficient documentation

## 2013-03-06 MED ORDER — SPIRONOLACTONE 25 MG PO TABS: ORAL_TABLET | ORAL | Status: DC

## 2013-03-06 NOTE — Patient Instructions (Addendum)
Avoid ingestion of  excess salt/sodium.Cook with pepper & other spices . Use the salt substitute "No Salt" OR the Mrs Sharilyn Sites products to season food @ the table. Avoid foods which taste salty or "vinegary" as their sodium contentet will be high. Minimal Blood Pressure Goal= AVERAGE < 140/90;  Ideal is an AVERAGE < 135/85. This AVERAGE should be calculated from @ least 5-7 BP readings taken @ different times of day on different days of week. You should not respond to isolated BP readings , but rather the AVERAGE for that week .Please bring your  blood pressure cuff to office visits to verify that it is reliable.It  can also be checked against the blood pressure device at the pharmacy. Finger or wrist cuffs are not dependable; an arm cuff is.

## 2013-03-06 NOTE — Assessment & Plan Note (Signed)
Spironolactone will be increased to twice a day. If blood pressure control is suboptimal and no other contributing cause such as sleep apnea is present; the metoprolol could be changed to carvedilol with titration of dose

## 2013-03-06 NOTE — Progress Notes (Signed)
  Subjective:    Patient ID: Kristine Olson, female    DOB: 04-26-1946, 67 y.o.   MRN: [id number]  HPI   The edema has persisted and actually progressed. Her blood pressure remains elevated. Her renal function and liver function tests were normal.  She has taken spironolactone 25 mg 3 days in a row. This was initiated rather than HCTZ as her potassium was low normal at 3.6.  Despite the spironolactone her blood pressure has averaged 146/100.      Review of Systems  She has no associated chest pain, palpitations, dyspnea,  PND or claudication. In reference to claudication she has not been exercising because of the edema     Objective:   Physical Exam Appears  well-nourished & in no acute distress  No carotid bruits are present.No neck pain distention present at 10 - 15 degrees. Thyroid normal to palpation  Heart rhythm and rate are normal with no significant murmurs or gallops.  Chest is clear with no increased work of breathing  There is no evidence of aortic aneurysm or renal artery bruits  Abdomen soft with no organomegaly or masses. No HJR. Dullness to percussion RUQ  No clubbing, cyanosis .Trace - 1/2+  edema present.  Pedal pulses are intact   Homan's negative  No ischemic skin changes are present . Nails healthy   Alert and oriented. Strength, tone, DTRs reflexes normal          Assessment & Plan:

## 2013-03-28 ENCOUNTER — Encounter: Payer: Self-pay | Admitting: Pulmonary Disease

## 2013-03-28 ENCOUNTER — Ambulatory Visit (INDEPENDENT_AMBULATORY_CARE_PROVIDER_SITE_OTHER): Payer: BC Managed Care – PPO | Admitting: Pulmonary Disease

## 2013-03-28 VITALS — BP 112/78 | HR 77 | Temp 97.7°F | Ht 63.0 in | Wt 214.4 lb

## 2013-03-28 DIAGNOSIS — G4733 Obstructive sleep apnea (adult) (pediatric): Secondary | ICD-10-CM | POA: Insufficient documentation

## 2013-03-28 NOTE — Assessment & Plan Note (Signed)
The patient's history is very suggestive of obstructive sleep apnea.  I have had a long discussion with her about sleep apnea, including its impact to her quality of life and cardiovascular health.  She will need to have a sleep study, and I think she is an excellent candidate for home sleep testing.  She is agreeable to this approach.

## 2013-03-28 NOTE — Progress Notes (Signed)
Subjective:    Patient ID: Kristine Olson, female    DOB: [birth year], 67 y.o.   MRN: [id number]  HPI  The patient is a 67 year old female who I've been asked to see for possible sleep apnea.  She has been noted to have loud snoring by her husband, as well as increased respiratory effort during the night while sleeping.  The patient awakens 2 times a night to go to the bathroom, and does not feel rested upon rising on many mornings.  She has no issues with sleep pressure if she stays active during the day, or if quiet things hold her interest.  However, she can definitely have sleep pressure with reading or watching television.  She also gets sleepy in the evening watching television, and also driving longer distances.  Her weight is up 10 pounds since 2 years ago, and her epworth score today is 10.   Sleep Questionnaire What time do you typically go to bed?( Between what hours) 11-12:30 11-12:30 at 1349 on 03/28/13 by Darrell Jewel, CMA How long does it take you to fall asleep? 5 minutes 5 minutes at 1349 on 03/28/13 by Darrell Jewel, CMA How many times during the night do you wake up? 2 2 at 1349 on 03/28/13 by Darrell Jewel, CMA What time do you get out of bed to start your day? 0730 0730 at 1349 on 03/28/13 by Darrell Jewel, CMA Do you drive or operate heavy machinery in your occupation? No No at 1349 on 03/28/13 by Darrell Jewel, CMA How much has your weight changed (up or down) over the past two years? (In pounds) 8 lb (3.629 kg) 8 lb (3.629 kg) at 1349 on 03/28/13 by Darrell Jewel, CMA Have you ever had a sleep study before? No No at 1349 on 03/28/13 by Darrell Jewel, CMA Do you currently use CPAP? No No at 1349 on 03/28/13 by Darrell Jewel, CMA Do you wear oxygen at any time? No    Review of Systems  Constitutional: Negative for fever and unexpected weight change.  HENT: Negative for ear pain, nosebleeds, congestion,  sore throat, rhinorrhea, sneezing, trouble swallowing, dental problem, postnasal drip and sinus pressure.   Eyes: Negative for redness and itching.  Respiratory: Negative for cough, chest tightness, shortness of breath and wheezing.   Cardiovascular: Positive for leg swelling. Negative for palpitations.  Gastrointestinal: Negative for nausea and vomiting.  Genitourinary: Negative for dysuria.  Musculoskeletal: Negative for joint swelling.  Skin: Negative for rash.  Neurological: Negative for headaches.  Hematological: Does not bruise/bleed easily.  Psychiatric/Behavioral: Positive for dysphoric mood. The patient is not nervous/anxious.        Objective:   Physical Exam Constitutional:  Overweight female, no acute distress  HENT:  Nares patent without discharge, but narrowed bilat.   Oropharynx without exudate, palate and uvula are minimal elongated.   Eyes:  Perrla, eomi, no scleral icterus  Neck:  No JVD, no TMG  Cardiovascular:  Normal rate, regular rhythm, no rubs or gallops.  No murmurs        Intact distal pulses  Pulmonary :  Normal breath sounds, no stridor or respiratory distress   No rales, rhonchi, or wheezing  Abdominal:  Soft, nondistended, bowel sounds present.  No tenderness noted.   Musculoskeletal:  No lower extremity edema noted.  Lymph Nodes:  No cervical lymphadenopathy noted  Skin:  No cyanosis noted  Neurologic:  Alert, appropriate, moves all 4 extremities without obvious  deficit.         Assessment & Plan:

## 2013-03-28 NOTE — Patient Instructions (Addendum)
Will schedule for home sleep testing, and will arrange for followup once the results are available. Work on weight loss. 

## 2013-04-15 ENCOUNTER — Telehealth: Payer: Self-pay | Admitting: Pulmonary Disease

## 2013-04-15 NOTE — Telephone Encounter (Signed)
Spoke to pt. States that when she was here on 03/31/2013, [facility] put in an order for home sleep study. When she went to [facility], she was told that there were 4 patients ahead of her. Advised that she would be called at the end of last week about when she could come get the machine. She hasn't heard from [place] and was getting concerned.  [facility] - could you guys look in to this for her? Thanks!

## 2013-04-17 ENCOUNTER — Telehealth: Payer: Self-pay | Admitting: Internal Medicine

## 2013-04-17 MED ORDER — SPIRONOLACTONE 25 MG PO TABS: ORAL_TABLET | ORAL | Status: DC

## 2013-04-17 NOTE — Telephone Encounter (Signed)
Discuss with patient, Rx sent. 

## 2013-04-17 NOTE — Telephone Encounter (Signed)
Please advise 

## 2013-04-17 NOTE — Telephone Encounter (Signed)
Pt aware we are waiting on [facility]bs to precert the study Kristine Olson

## 2013-04-17 NOTE — Telephone Encounter (Signed)
OK # 30, R X 1 until we get Sleep evaluation completed

## 2013-04-17 NOTE — Telephone Encounter (Signed)
Noted.  pcc to address.

## 2013-04-17 NOTE — Telephone Encounter (Signed)
Patient states she was put on spironolactone 50 mg per day until she has a sleep study. She will be out of medication tomorrow and would like to know if she should continue taking this dosage. She has not had a sleep study due to insurance. If pt should continue taking medication, send rx to [facility] in [place].

## 2013-04-21 DIAGNOSIS — G4710 Hypersomnia, unspecified: Secondary | ICD-10-CM

## 2013-04-21 DIAGNOSIS — G479 Sleep disorder, unspecified: Secondary | ICD-10-CM

## 2013-04-21 DIAGNOSIS — R0609 Other forms of dyspnea: Secondary | ICD-10-CM

## 2013-04-21 DIAGNOSIS — R0989 Other specified symptoms and signs involving the circulatory and respiratory systems: Secondary | ICD-10-CM

## 2013-04-28 ENCOUNTER — Telehealth: Payer: Self-pay | Admitting: Pulmonary Disease

## 2013-04-28 DIAGNOSIS — G4733 Obstructive sleep apnea (adult) (pediatric): Secondary | ICD-10-CM

## 2013-04-28 NOTE — Telephone Encounter (Signed)
Pt needs ov to discuss sleep study results.  

## 2013-04-29 NOTE — Telephone Encounter (Signed)
Patient scheduled to f/u with [facility] for review of study 05/06/13 at 1030

## 2013-05-06 ENCOUNTER — Encounter: Payer: Self-pay | Admitting: Pulmonary Disease

## 2013-05-08 ENCOUNTER — Encounter: Payer: Self-pay | Admitting: Pulmonary Disease

## 2013-05-08 ENCOUNTER — Ambulatory Visit (INDEPENDENT_AMBULATORY_CARE_PROVIDER_SITE_OTHER): Payer: BC Managed Care – PPO | Admitting: Pulmonary Disease

## 2013-05-08 VITALS — BP 138/90 | HR 79 | Temp 98.3°F | Ht 64.0 in | Wt 211.4 lb

## 2013-05-08 DIAGNOSIS — G4733 Obstructive sleep apnea (adult) (pediatric): Secondary | ICD-10-CM

## 2013-05-08 NOTE — Patient Instructions (Addendum)
Will start on cpap.  Please call if having tolerance issues. Work on weight reduction. followup with me in 6 weeks.

## 2013-05-08 NOTE — Assessment & Plan Note (Signed)
The patient has been diagnosed with severe obstructive sleep apnea by her recent home sleep testing.  I have recommended treatment with CPAP while she is working on weight loss.  The patient is agreeable to this approach. I will set the patient up on cpap at a moderate pressure level to allow for desensitization, and will troubleshoot the device over the next 4-6weeks if needed.  The pt is to call me if having issues with tolerance.  Will then optimize the pressure once patient is able to wear cpap on a consistent basis.

## 2013-05-08 NOTE — Progress Notes (Signed)
  Subjective:    Patient ID: Kristine Olson, female    DOB: [birth year], 67 y.o.   MRN: [id number]  HPI The patient comes in today for followup after her recent sleep testing.  She was found to have an AHI of 38 events per hour, and oxygen desaturation as low as 73%.  I have reviewed the study with her in detail, and answered all of her questions.   Review of Systems  Constitutional: Negative for fever and unexpected weight change.  HENT: Negative for ear pain, nosebleeds, congestion, sore throat, rhinorrhea, sneezing, trouble swallowing, dental problem, postnasal drip and sinus pressure.        Allergy symptoms  Eyes: Negative for redness and itching.  Respiratory: Negative for cough, chest tightness, shortness of breath and wheezing.   Cardiovascular: Negative for palpitations and leg swelling.  Gastrointestinal: Negative for nausea and vomiting.  Genitourinary: Negative for dysuria.  Musculoskeletal: Negative for joint swelling.  Skin: Negative for rash.  Neurological: Negative for headaches.  Hematological: Does not bruise/bleed easily.  Psychiatric/Behavioral: Negative for dysphoric mood. The patient is not nervous/anxious.        Objective:   Physical Exam Obese female in no acute distress Nose without purulence or discharge noted Neck without lymphadenopathy or thyromegaly Lower extremities without edema, no cyanosis Alert and oriented, moves all 4 extremities.       Assessment & Plan:

## 2013-06-05 LAB — HM PAP SMEAR

## 2013-06-19 ENCOUNTER — Ambulatory Visit (INDEPENDENT_AMBULATORY_CARE_PROVIDER_SITE_OTHER): Payer: Medicare Other | Admitting: Pulmonary Disease

## 2013-06-19 ENCOUNTER — Encounter: Payer: Self-pay | Admitting: Pulmonary Disease

## 2013-06-19 VITALS — BP 126/80 | HR 74 | Temp 97.7°F | Ht 64.0 in | Wt 213.0 lb

## 2013-06-19 DIAGNOSIS — G4733 Obstructive sleep apnea (adult) (pediatric): Secondary | ICD-10-CM

## 2013-06-19 NOTE — Patient Instructions (Addendum)
Will optimize your pressure on the auto setting for a few weeks, and let you know your optimal pressure.  You can stay on the auto setting if this is more comfortable for you. Work on weight loss followup with me in 6mos, but call if still having tolerance issues.

## 2013-06-19 NOTE — Progress Notes (Signed)
  Subjective:    Patient ID: Kristine Olson, female    DOB: [birth year], 67 y.o.   MRN: [id number]  HPI The patient comes in today for followup of her obstructive sleep apnea.  She was started on CPAP at the last visit, and has been compliant with its use based on her download.  She has only mild breakthrough, but her pressure has yet to be optimized.  She had no significant mask leak.  The patient denies any issues with her pressure, but is having issues with aggravation from her mask.  She is scheduled to see her home care company today about a different type.  She feels that she sleeps better with the device, but has not seen an improvement in daytime alertness.   Review of Systems  Constitutional: Negative for fever and unexpected weight change.  HENT: Negative for ear pain, nosebleeds, congestion, sore throat, rhinorrhea, sneezing, trouble swallowing, dental problem, postnasal drip and sinus pressure.   Eyes: Negative for redness and itching.  Respiratory: Negative for cough, chest tightness, shortness of breath and wheezing.   Cardiovascular: Negative for palpitations and leg swelling.  Gastrointestinal: Negative for nausea and vomiting.  Genitourinary: Negative for dysuria.  Musculoskeletal: Negative for joint swelling.  Skin: Negative for rash.  Neurological: Negative for headaches.  Hematological: Does not bruise/bleed easily.  Psychiatric/Behavioral: Negative for dysphoric mood. The patient is not nervous/anxious.        Objective:   Physical Exam Overweight female in no acute distress Nose without purulent discharge noted Neck without lymphadenopathy or thyromegaly No skin breakdown or pressure necrosis from the CPAP mask Lower extremities without edema, no cyanosis Alert and oriented, moves all 4 extremities.       Assessment & Plan:

## 2013-06-19 NOTE — Assessment & Plan Note (Signed)
The patient is having no issues with her CPAP pressure, but is having issues with mask comfort.  Her download shows excellent compliance as well as fairly good control of her obstructive sleep apnea.  The patient does feel that she sleeps better with the device, and feels more rested, but has not seen an increase in energy or daytime alertness.  She has an appointment with her home care company today to look at different types of masks.  We'll optimize her pressure on the automatic setting, and also encouraged her to work on weight loss.  Care Plan:  At this point, will arrange for the patient's machine to be changed over to auto mode for 2 weeks to optimize their pressure.  I will review the downloaded data once sent by dme, and also evaluate for compliance, leaks, and residual osa.  I will call the patient and dme to discuss the results, and have the patient's machine set appropriately.  This will serve as the pt's cpap pressure titration.

## 2013-06-19 NOTE — Progress Notes (Deleted)
  Subjective:    Patient ID: Kristine Olson, female    DOB: [birth year], 67 y.o.   MRN: [id number]  HPI    Review of Systems     Objective:   Physical Exam        Assessment & Plan:

## 2013-06-25 ENCOUNTER — Other Ambulatory Visit: Payer: Self-pay | Admitting: Internal Medicine

## 2013-06-26 NOTE — Telephone Encounter (Signed)
Patient returned your call. She states she originally took 1 x daily, but then it was increased to 2 x daily until her sleep study. She states she had the sleep study and does have sleep apnea. She is currently taking 2 x daily, but she states "it is getting better," and she thinks she may just need 1 x daily.

## 2013-06-26 NOTE — Telephone Encounter (Signed)
LM @ (9:20am) asking the pt to RTC regarding dose on med refill.//AB/CMA

## 2013-07-01 ENCOUNTER — Other Ambulatory Visit: Payer: Self-pay | Admitting: General Practice

## 2013-07-01 MED ORDER — SPIRONOLACTONE 25 MG PO TABS: ORAL_TABLET | ORAL | Status: DC

## 2013-07-01 NOTE — Telephone Encounter (Signed)
Spoke with the pt and informed her that Dr. Alwyn Ren stated that the Spironolactone is not affecting her kidneys or the K level,so as long as her BP is 140/90 or lower she can take the Spironolactone one tablet daily.  But if her BP is greater than 140/90 than she will need to take it twice daily.  Pt stated that her BP has been running in the teens.  Pt said she was also on the Spironolactone for swelling which it has helped, but she has noticed some swelling in her ankles and she feels its coming from sitting a lot.  Informed the pt to keep a check on the swelling and if she notice it's getting worse she needs to come and see Dr. Alwyn Ren about it.  Pt agreed.//AB/CMA

## 2013-07-09 ENCOUNTER — Telehealth: Payer: Self-pay | Admitting: Pulmonary Disease

## 2013-07-09 DIAGNOSIS — G4733 Obstructive sleep apnea (adult) (pediatric): Secondary | ICD-10-CM

## 2013-07-09 NOTE — Telephone Encounter (Signed)
Download received.  I have given this to Ashtyn to have [facility] address today.

## 2013-07-09 NOTE — Telephone Encounter (Signed)
Spoke with KC.  He has reviewed download.  Pt's optimal pressure is 14.  We can send order to have cpap set on set pressure of 14.  -----  I have placed order and asked this be taken care of prior to pt leaving on tomorrow to go out of town.  I have also sent staff msg to [facility] regarding this.  Pt is aware of this and was very appreciative of this.  She is aware to call back if she has any problems with cpap being set on this fixed pressure.

## 2013-07-09 NOTE — Telephone Encounter (Signed)
If we can get the auto download off her machine so I can set her machine to the correct fixed pressure.  This was supposed to have been done in 2 weeks.

## 2013-07-09 NOTE — Telephone Encounter (Signed)
I called [facility], spoke with Inetta Fermo.   She will take download from pt's modem and will fax results to triage today.   Will route msg to triage to ensure results are received given pt requesting this be done before leaving tomorrow.

## 2013-07-09 NOTE — Telephone Encounter (Signed)
Called, spoke with pt.  She was seen on 06/19/13:  Patient Instructions    Will optimize your pressure on the auto setting for a few weeks, and let you know your optimal pressure. You can stay on the auto setting if this is more comfortable for you.  Work on weight loss  followup with me in 6mos, but call if still having tolerance issues.    -------  Pt states she feels the strongest pressure on the auto setting is too high.  She feels it "puffs me full of air."  Pt states she is constantly pulling off the machine to get a breath and then putting it back on.  She would like to have the pressure changed to a set pressure.  She will be leaving tomorrow evening to go out of town and will not be coming back until Sunday or Monday.  She would to have pressure adjusted prior to leaving tomorrow.  KC, pls advise.  Thank you.

## 2013-07-10 ENCOUNTER — Encounter: Payer: Self-pay | Admitting: Pulmonary Disease

## 2013-07-10 NOTE — Telephone Encounter (Signed)
Agreed -

## 2013-07-28 ENCOUNTER — Ambulatory Visit (INDEPENDENT_AMBULATORY_CARE_PROVIDER_SITE_OTHER): Payer: Medicare Other | Admitting: Internal Medicine

## 2013-07-28 ENCOUNTER — Encounter: Payer: Self-pay | Admitting: Internal Medicine

## 2013-07-28 VITALS — BP 131/86 | HR 95 | Temp 97.9°F | Wt 213.0 lb

## 2013-07-28 DIAGNOSIS — I83893 Varicose veins of bilateral lower extremities with other complications: Secondary | ICD-10-CM

## 2013-07-28 DIAGNOSIS — I83811 Varicose veins of right lower extremities with pain: Secondary | ICD-10-CM

## 2013-07-28 NOTE — Patient Instructions (Addendum)
Wear over the calf support hose if you are standing for prolonged periods of  time or working on your feet for prolonged periods of time. 

## 2013-07-28 NOTE — Progress Notes (Signed)
  Subjective:    Patient ID: Kristine Olson, female    DOB: 1945/12/29, 67 y.o.   MRN: [id number]  HPI  Symptoms began approximately 4 days ago as a sensation of heat in the right lateral calf "like fire". The only predisposition was prolonged shopping and standing while decorating. She denies associated prolonged travel or bed rest.  The symptom is intermittent. There's been no treatment to date.  She has had similar symptoms in the past which have been self resolving.  Past medical history was reviewed; there is no personal  history of deep venous thrombosis/ pulmonary emboli .  She's not on statin.   Review of Systems  She denies fever, chills, sweats  She also has had no chest pain, palpitations, or dyspnea.       Objective:   Physical Exam Gen.: Healthy and well-nourished in appearance. Alert, appropriate and cooperative throughout exam. Neck: No deformities, masses, or tenderness noted. No neck vein distention at 10 Lungs: Normal respiratory effort; chest expands symmetrically. Lungs are clear to auscultation without rales, wheezes, or increased work of breathing. Heart: Normal rate and rhythm. Normal S1 and S2. No gallop, click, or rub. S4 w/o murmur. Abdomen: Bowel sounds normal; abdomen soft and nontender. No masses, organomegaly or hernias noted.  No hepatojugular reflux                                 Musculoskeletal/extremities:  No clubbing, cyanosis, edema, or significant extremity  deformity noted. Range of motion normal .Tone & strength  Normal.  Able to lie down & sit up w/o help. Vascular: Carotid, radial artery, dorsalis pedis and  posterior tibial pulses are full and equal. Nontender, prominent varicosity right lateral calf. This empties with leg elevation. Homans sign negative Neurologic: Alert and oriented x3.         Skin: Intact without suspicious lesions or rashes. Psych: Mood and affect are normal. Normally interactive                                                                                         Assessment & Plan:  #1 symptomatic varicose vein. Clinically no evidence of deep venous thrombosis  Plan: Over-the-calf support hose with prolonged standing or sitting

## 2013-08-20 ENCOUNTER — Other Ambulatory Visit: Payer: Self-pay | Admitting: Internal Medicine

## 2013-08-20 NOTE — Telephone Encounter (Signed)
Metoprolol refill sent to pharmacy

## 2013-08-28 ENCOUNTER — Other Ambulatory Visit: Payer: Self-pay

## 2013-09-10 ENCOUNTER — Other Ambulatory Visit: Payer: Self-pay | Admitting: Pulmonary Disease

## 2013-09-17 ENCOUNTER — Other Ambulatory Visit: Payer: Self-pay | Admitting: Internal Medicine

## 2013-09-19 NOTE — Telephone Encounter (Signed)
Levothyroxine refilled per protocol 

## 2013-10-23 HISTORY — PX: COLONOSCOPY: SHX174

## 2013-11-11 ENCOUNTER — Other Ambulatory Visit: Payer: Self-pay | Admitting: Internal Medicine

## 2013-11-12 NOTE — Telephone Encounter (Signed)
Spironolactone refilled per protocol. JG//CMA 

## 2013-12-22 ENCOUNTER — Ambulatory Visit (INDEPENDENT_AMBULATORY_CARE_PROVIDER_SITE_OTHER): Payer: Medicare Other | Admitting: Pulmonary Disease

## 2013-12-22 ENCOUNTER — Encounter: Payer: Self-pay | Admitting: Pulmonary Disease

## 2013-12-22 VITALS — BP 138/84 | HR 66 | Temp 97.7°F | Ht 64.0 in | Wt 216.0 lb

## 2013-12-22 DIAGNOSIS — G4733 Obstructive sleep apnea (adult) (pediatric): Secondary | ICD-10-CM

## 2013-12-22 NOTE — Assessment & Plan Note (Signed)
The patient is wearing CPAP compliantly, and has seen improvement in her sleep and daytime alertness. She is not overly satisfied with her current mask fit, and I will refer her to the sleep Center for a mask fitting session. I've also encouraged her to work aggressively on weight loss.

## 2013-12-22 NOTE — Progress Notes (Signed)
   Subjective:    Patient ID: Kristine Olson, female    DOB: [birth year], 68 y.o.   MRN: [id number]  HPI The patient comes in today for followup of her obstructive sleep apnea. She is wearing CPAP compliantly had an optimized pressure, and feels that it has helped her sleep and daytime alertness. She is having no issues with pressure, but is having issues with mask comfort. She would like to continue with CPAP, but is asking about a more comfortable mask.   Review of Systems  Constitutional: Negative for fever and unexpected weight change.  HENT: Negative for congestion, dental problem, ear pain, nosebleeds, postnasal drip, rhinorrhea, sinus pressure, sneezing, sore throat and trouble swallowing.   Eyes: Negative for redness and itching.  Respiratory: Negative for cough, chest tightness, shortness of breath and wheezing.   Cardiovascular: Negative for palpitations and leg swelling.  Gastrointestinal: Negative for nausea and vomiting.  Genitourinary: Negative for dysuria.  Musculoskeletal: Negative for joint swelling.  Skin: Negative for rash.  Neurological: Negative for headaches.  Hematological: Does not bruise/bleed easily.  Psychiatric/Behavioral: Negative for dysphoric mood. The patient is not nervous/anxious.        Objective:   Physical Exam Overweight female in no acute distress Nose without purulence or discharge noted No skin breakdown or pressure necrosis from the CPAP mass Neck without lymphadenopathy or thyromegaly Lower extremities without edema, no cyanosis Alert and oriented, moves all 4 extremities.       Assessment & Plan:

## 2013-12-22 NOTE — Patient Instructions (Signed)
Will refer you to the sleep center for a mask fitting session. Continue to work on weight loss followup with me in one year if doing well.  Please call if you are having tolerance issues that we can help with.

## 2013-12-24 ENCOUNTER — Ambulatory Visit (HOSPITAL_BASED_OUTPATIENT_CLINIC_OR_DEPARTMENT_OTHER): Payer: Medicare Other | Attending: Pulmonary Disease | Admitting: Radiology

## 2013-12-24 DIAGNOSIS — G4733 Obstructive sleep apnea (adult) (pediatric): Secondary | ICD-10-CM

## 2013-12-24 DIAGNOSIS — Z9989 Dependence on other enabling machines and devices: Principal | ICD-10-CM

## 2014-02-10 LAB — HM MAMMOGRAPHY

## 2014-02-17 ENCOUNTER — Other Ambulatory Visit: Payer: Self-pay | Admitting: Internal Medicine

## 2014-07-06 ENCOUNTER — Encounter: Payer: Self-pay | Admitting: Internal Medicine

## 2014-07-24 ENCOUNTER — Encounter: Payer: Self-pay | Admitting: Internal Medicine

## 2014-08-07 ENCOUNTER — Encounter: Payer: Self-pay | Admitting: Internal Medicine

## 2014-08-26 ENCOUNTER — Other Ambulatory Visit: Payer: Self-pay | Admitting: Internal Medicine

## 2014-09-07 ENCOUNTER — Ambulatory Visit (AMBULATORY_SURGERY_CENTER): Payer: Self-pay | Admitting: *Deleted

## 2014-09-07 VITALS — Ht 64.0 in | Wt 219.6 lb

## 2014-09-07 DIAGNOSIS — Z86010 Personal history of colonic polyps: Secondary | ICD-10-CM

## 2014-09-07 NOTE — Progress Notes (Signed)
No egg or soy allergy. ewm No home 02 but uses c pap. ewm No diet pills. ewm No issues with past sedation. ewm Pt declined emmi. ewm

## 2014-09-21 ENCOUNTER — Ambulatory Visit (AMBULATORY_SURGERY_CENTER): Payer: Medicare Other | Admitting: Internal Medicine

## 2014-09-21 ENCOUNTER — Encounter: Payer: Self-pay | Admitting: Internal Medicine

## 2014-09-21 VITALS — BP 119/85 | HR 58 | Temp 96.5°F | Resp 19 | Ht 64.0 in | Wt 219.0 lb

## 2014-09-21 DIAGNOSIS — D125 Benign neoplasm of sigmoid colon: Secondary | ICD-10-CM

## 2014-09-21 DIAGNOSIS — Z86010 Personal history of colonic polyps: Secondary | ICD-10-CM

## 2014-09-21 MED ORDER — SODIUM CHLORIDE 0.9 % IV SOLN: 500.0000 mL | INTRAVENOUS | Status: DC

## 2014-09-21 NOTE — Patient Instructions (Addendum)
I found and removed one tiny polyp that looks benign. You also have a condition called diverticulosis - common and not usually a problem. Please read the handout provided.  I also saw hemorrhoids which I think have been causing the bleeding you see. I am able to treat those with an in-office procedure. If you like, please call my office at [phone] to schedule an appointment and I can evaluate you further.  I appreciate the opportunity to care for you. Gatha Mayer, MD, FACG  YOU HAD AN ENDOSCOPIC PROCEDURE TODAY AT [facility]: Refer to the procedure report that was given to you for any specific questions about what was found during the examination.  If the procedure report does not answer your questions, please call your gastroenterologist to clarify.  If you requested that your care partner not be given the details of your procedure findings, then the procedure report has been included in a sealed envelope for you to review at your convenience later.  YOU SHOULD EXPECT: Some feelings of bloating in the abdomen. Passage of more gas than usual.  Walking can help get rid of the air that was put into your GI tract during the procedure and reduce the bloating. If you had a lower endoscopy (such as a colonoscopy or flexible sigmoidoscopy) you may notice spotting of blood in your stool or on the toilet paper. If you underwent a bowel prep for your procedure, then you may not have a normal bowel movement for a few days.  DIET: Your first meal following the procedure should be a light meal and then it is ok to progress to your normal diet.  A half-sandwich or bowl of soup is an example of a good first meal.  Heavy or fried foods are harder to digest and may make you feel nauseous or bloated.  Likewise meals heavy in dairy and vegetables can cause extra gas to form and this can also increase the bloating.  Drink plenty of fluids but you should avoid alcoholic beverages for 24  hours. Try the align as directed by Dr. Carlean Purl.  ACTIVITY: Your care partner should take you home directly after the procedure.  You should plan to take it easy, moving slowly for the rest of the day.  You can resume normal activity the day after the procedure however you should NOT DRIVE or use heavy machinery for 24 hours (because of the sedation medicines used during the test).    SYMPTOMS TO REPORT IMMEDIATELY: A gastroenterologist can be reached at any hour.  During normal business hours, 8:30 AM to 5:00 PM Monday through Friday, call [phone].  After hours and on weekends, please call the GI answering service at [phone] who will take a message and have the physician on call contact you.   Following lower endoscopy (colonoscopy or flexible sigmoidoscopy):  Excessive amounts of blood in the stool  Significant tenderness or worsening of abdominal pains  Swelling of the abdomen that is new, acute  Fever of 100F or higher  FOLLOW UP: If any biopsies were taken you will be contacted by phone or by letter within the next 1-3 weeks.  Call your gastroenterologist if you have not heard about the biopsies in 3 weeks.  Our staff will call the home number listed on your records the next business day following your procedure to check on you and address any questions or concerns that you may have at that time regarding the information given to  you following your procedure. This is a courtesy call and so if there is no answer at the home number and we have not heard from you through the emergency physician on call, we will assume that you have returned to your regular daily activities without incident.  SIGNATURES/CONFIDENTIALITY: You and/or your care partner have signed paperwork which will be entered into your electronic medical record.  These signatures attest to the fact that that the information above on your After Visit Summary has been reviewed and is understood.  Full responsibility  of the confidentiality of this discharge information lies with you and/or your care-partner.  Please, read all of the handouts given to you by your recovery room nurse.

## 2014-09-21 NOTE — Progress Notes (Signed)
Pt stable to RR 

## 2014-09-21 NOTE — Progress Notes (Signed)
Called to room to assist during endoscopic procedure.  Patient ID and intended procedure confirmed with present staff. Received instructions for my participation in the procedure from the performing physician.  

## 2014-09-21 NOTE — Op Note (Signed)
[place]  [address] [place], [id number]   COLONOSCOPY PROCEDURE REPORT  PATIENT: Kristine Olson, Kristine Olson  MR#: [id number] BIRTHDATE: 07-Jun-1946 , 31  yrs. old GENDER: female ENDOSCOPIST: Gatha Mayer, MD, [facility] PROCEDURE DATE:  09/21/2014 PROCEDURE:   Colonoscopy with snare polypectomy First Screening Colonoscopy - Avg.  risk and is 50 yrs.  old or older - No.  Prior Negative Screening - Now for repeat screening. N/A  History of Adenoma - Now for follow-up colonoscopy & has been > or = to 3 yrs.  Yes hx of adenoma.  Has been 3 or more years since last colonoscopy.  Polyps Removed Today? Yes. ASA CLASS:   Class II INDICATIONS:surveillance colonoscopy based on a history of adenomatous colonic polyp(s). MEDICATIONS: Propofol 250 mg IV and Monitored anesthesia care lidocaine 40 mg IV  DESCRIPTION OF PROCEDURE:   After the risks benefits and alternatives of the procedure were thoroughly explained, informed consent was obtained.  The digital rectal exam revealed no abnormalities of the rectum.   The LB [id number]  endoscope was introduced through the anus and advanced to the cecum, which was identified by both the appendix and ileocecal valve. No adverse events experienced.   The quality of the prep was excellent, using MiraLax  The instrument was then slowly withdrawn as the colon was fully examined.      COLON FINDINGS: 1) 5 mm sigmoid polyp completely removed and sent to pathology - cold snare 2) sigmoid diverticulosis 3) internal hemorrhoids 4) otherwise normal colonoscopy with right colon retroflexion.  Retroflexed views revealed internal hemorrhoids. The time to cecum=5 minutes 19 seconds.  Withdrawal time=6 minutes 45 seconds.  The scope was withdrawn and the procedure completed. COMPLICATIONS: There were no immediate complications.  ENDOSCOPIC IMPRESSION: 1) 5 mm sigmoid polyp removed 2) sigmoid diverticulosis 3) internal hemorrhoids 4)  otherwise normal colonoscopy  - excellent prep - hx adenomas 2007 and 2010  RECOMMENDATIONS: 1.  Timing of repeat colonoscopy will be determined by pathology findings. 2.  She may call for hemorrhoid evaluation and possible banding if desired  eSigned:  Gatha Mayer, MD, [facility] 09/21/2014 10:08 AM   cc: The Patient

## 2014-09-22 ENCOUNTER — Telehealth: Payer: Self-pay

## 2014-09-22 NOTE — Telephone Encounter (Signed)
  Follow up Call-  Call back number 09/21/2014  Post procedure Call Back phone  # [phone]  Permission to leave phone message Yes     Patient questions:  Do you have a fever, pain , or abdominal swelling? No. Pain Score  0 *  Have you tolerated food without any problems? Yes.    Have you been able to return to your normal activities? Yes.    Do you have any questions about your discharge instructions: Diet   No. Medications  No. Follow up visit  No.  Do you have questions or concerns about your Care? No.  Actions: * If pain score is 4 or above: No action needed, pain <4.

## 2014-09-24 ENCOUNTER — Encounter: Payer: Self-pay | Admitting: Internal Medicine

## 2014-09-24 ENCOUNTER — Other Ambulatory Visit: Payer: Self-pay

## 2014-09-24 MED ORDER — LEVOTHYROXINE SODIUM 112 MCG PO TABS: ORAL_TABLET | ORAL | Status: DC

## 2014-09-24 NOTE — Progress Notes (Signed)
Quick Note:  Diminutive adenoma Repeat colon 2020 ______

## 2014-11-25 ENCOUNTER — Encounter: Payer: Self-pay | Admitting: Internal Medicine

## 2014-11-25 ENCOUNTER — Ambulatory Visit (INDEPENDENT_AMBULATORY_CARE_PROVIDER_SITE_OTHER): Payer: 59 | Admitting: Internal Medicine

## 2014-11-25 ENCOUNTER — Other Ambulatory Visit (INDEPENDENT_AMBULATORY_CARE_PROVIDER_SITE_OTHER): Payer: 59

## 2014-11-25 VITALS — BP 130/88 | HR 80 | Temp 98.2°F | Resp 15 | Ht 64.0 in | Wt 218.2 lb

## 2014-11-25 DIAGNOSIS — R7301 Impaired fasting glucose: Secondary | ICD-10-CM

## 2014-11-25 DIAGNOSIS — E559 Vitamin D deficiency, unspecified: Secondary | ICD-10-CM

## 2014-11-25 DIAGNOSIS — M8580 Other specified disorders of bone density and structure, unspecified site: Secondary | ICD-10-CM

## 2014-11-25 DIAGNOSIS — E785 Hyperlipidemia, unspecified: Secondary | ICD-10-CM

## 2014-11-25 DIAGNOSIS — Z86010 Personal history of colonic polyps: Secondary | ICD-10-CM

## 2014-11-25 DIAGNOSIS — E038 Other specified hypothyroidism: Secondary | ICD-10-CM

## 2014-11-25 DIAGNOSIS — I10 Essential (primary) hypertension: Secondary | ICD-10-CM

## 2014-11-25 LAB — HEPATIC FUNCTION PANEL
ALK PHOS: 61 U/L (ref 39–117)
ALT: 12 U/L (ref 0–35)
AST: 16 U/L (ref 0–37)
Albumin: 4.2 g/dL (ref 3.5–5.2)
Bilirubin, Direct: 0.2 mg/dL (ref 0.0–0.3)
Total Bilirubin: 0.4 mg/dL (ref 0.2–1.2)
Total Protein: 7.2 g/dL (ref 6.0–8.3)

## 2014-11-25 LAB — BASIC METABOLIC PANEL
BUN: 19 mg/dL (ref 6–23)
CALCIUM: 9.8 mg/dL (ref 8.4–10.5)
CHLORIDE: 103 meq/L (ref 96–112)
CO2: 30 mEq/L (ref 19–32)
Creatinine, Ser: 1.21 mg/dL — ABNORMAL HIGH (ref 0.40–1.20)
GFR: 47.25 mL/min — ABNORMAL LOW (ref >60.00)
Glucose, Bld: 97 mg/dL (ref 70–99)
Potassium: 4.7 mEq/L (ref 3.5–5.1)
SODIUM: 138 meq/L (ref 135–145)

## 2014-11-25 LAB — LIPID PANEL
CHOL/HDL RATIO: 3
Cholesterol: 177 mg/dL (ref 0–200)
HDL: 65 mg/dL (ref >39.00)
LDL CALC: 96 mg/dL (ref 0–99)
NONHDL: 112
Triglycerides: 81 mg/dL (ref 0.0–149.0)
VLDL: 16.2 mg/dL (ref 0.0–40.0)

## 2014-11-25 LAB — CBC WITH DIFFERENTIAL/PLATELET
Basophils Absolute: 0 10*3/uL (ref 0.0–0.1)
Basophils Relative: 0.3 % (ref 0.0–3.0)
Eosinophils Absolute: 0.3 10*3/uL (ref 0.0–0.7)
Eosinophils Relative: 3.7 % (ref 0.0–5.0)
HCT: 38.4 % (ref 36.0–46.0)
Hemoglobin: 13.1 g/dL (ref 12.0–15.0)
Lymphocytes Relative: 18.3 % (ref 12.0–46.0)
Lymphs Abs: 1.6 10*3/uL (ref 0.7–4.0)
MCHC: 34.1 g/dL (ref 30.0–36.0)
MCV: 85.8 fl (ref 78.0–100.0)
MONO ABS: 0.6 10*3/uL (ref 0.1–1.0)
Monocytes Relative: 6.8 % (ref 3.0–12.0)
Neutro Abs: 6.2 10*3/uL (ref 1.4–7.7)
Neutrophils Relative %: 70.9 % (ref 43.0–77.0)
Platelets: 210 10*3/uL (ref 150.0–400.0)
RBC: 4.48 Mil/uL (ref 3.87–5.11)
RDW: 14 % (ref 11.5–15.5)
WBC: 8.8 10*3/uL (ref 4.0–10.5)

## 2014-11-25 LAB — HEMOGLOBIN A1C: HEMOGLOBIN A1C: 5.9 % (ref 4.6–6.5)

## 2014-11-25 LAB — TSH: TSH: 1.55 u[IU]/mL (ref 0.35–4.50)

## 2014-11-25 LAB — VITAMIN D 25 HYDROXY (VIT D DEFICIENCY, FRACTURES): VITD: 12.42 ng/mL — AB (ref 30.00–100.00)

## 2014-11-25 NOTE — Assessment & Plan Note (Signed)
Lipids, LFTs, TSH  

## 2014-11-25 NOTE — Assessment & Plan Note (Signed)
TSH 

## 2014-11-25 NOTE — Patient Instructions (Signed)
Your next office appointment will be determined based upon review of your pending labs . Those instructions will be transmitted to you through My Chart  Critical values will be called   Followup as needed for your acute issue. Please report any significant change in your symptoms.  Minimal Blood Pressure Goal= AVERAGE < 140/90;  Ideal is an AVERAGE < 135/85. This AVERAGE should be calculated from @ least 5-7 BP readings taken @ different times of day on different days of week. You should not respond to isolated BP readings , but rather the AVERAGE for that week .Please bring your  blood pressure cuff to office visits to verify that it is reliable.It  can also be checked against the blood pressure device at the pharmacy. Finger or wrist cuffs are not dependable; an arm cuff is.  Reflux of gastric acid may be asymptomatic as this may occur mainly during sleep.The triggers for reflux  include stress; the "aspirin family" ; alcohol; peppermint; and caffeine (coffee, tea, cola, and chocolate). The aspirin family would include aspirin and the nonsteroidal agents such as ibuprofen &  Naproxen. Tylenol would not cause reflux. If having symptoms ; food & drink should be avoided for @ least 2 hours before going to bed.    Please take a probiotic , Florastor OR Align, every day if the bowels are loose. This will replace the normal bacteria which  are necessary for formation of normal stool and processing of food.

## 2014-11-25 NOTE — Assessment & Plan Note (Signed)
CBC & dif 

## 2014-11-25 NOTE — Assessment & Plan Note (Signed)
Blood pressure goals reviewed. BMET 

## 2014-11-25 NOTE — Assessment & Plan Note (Signed)
A1c

## 2014-11-25 NOTE — Assessment & Plan Note (Signed)
Vitamin D level 

## 2014-11-25 NOTE — Progress Notes (Signed)
Subjective:    Patient ID: Kristine Olson, female    DOB: [birth year], 69 y.o.   MRN: [id number]  HPI The patient is here to assess status of active health conditions. PMH, FH, & Social History reviewed & updated.  She had been temporarily off thespironolactone for several weeks but now has resumed it in past 2 months without adverse effects.BP 125-140s/<90.  She is on a modified heart healthy, no added salt diet; no exercise program.    Review of Systems Pertinent negative or absent signs and symptoms are as follows: Constitutional: No significant change in weight; significant fatigue; sleep disorder; change in appetite. Eye: no blurred, double ,loss of vision Cardiovascular: no palpitations; racing; irregularity ENT/GI: no constipation; hoarseness;dysphagia,melena , rectal bleeding. Occasional loose - watery stool due to IBS. No probiotic use. Derm: no change in nails,hair,skin Neuro: no numbness or tingling; tremor Psych:no anxiety; depression; panic attacks Endo: no temperature intolerance to heat ,cold      Objective:   Physical Exam  Gen.: Adequately nourished in appearance. Alert, appropriate and cooperative throughout exam. As per CDC Guidelines ,Epic documents obesity as being present . Appears younger than stated age  Head: Normocephalic without obvious abnormalities  Eyes: No corneal or conjunctival inflammation noted. Pupils equal round reactive to light and accommodation. Extraocular motion intact. No lid lag or proptosis Ears: External  ear exam reveals no significant lesions or deformities. Canals clear .TMs normal.Minor scarring L TM inferiorly. Hearing is grossly normal bilaterally. Nose: External nasal exam reveals no deformity or inflammation. Nasal mucosa are pink and moist. No lesions or exudates noted.   Mouth: Oral mucosa and oropharynx reveal no lesions or exudates. Teeth in good repair. Neck: No deformities, masses, or tenderness noted. Range of motion  & Thyroid normal. Lungs: Normal respiratory effort; chest expands symmetrically. Lungs are clear to auscultation without rales, wheezes, or increased work of breathing. Heart: Normal rate and rhythm. Normal S1 and S2. No gallop, click, or rub. No murmur. Abdomen: Bowel sounds normal; abdomen soft and nontender. No masses, organomegaly or hernias noted. Genitalia: as per Gyn                                  Musculoskeletal/extremities: No deformity or scoliosis noted of  the thoracic or lumbar spine.  No clubbing, cyanosis, edema, or significant extremity  deformity noted.  Range of motion normal . Tone & strength normal. Hand joints normal  Fingernail  health good. Able to lie down & sit up w/o help.  Negative SLR bilaterally Vascular: Carotid, radial artery, dorsalis pedis and  posterior tibial pulses are full and equal. No bruits present. Neurologic: Alert and oriented x3. Deep tendon reflexes symmetrical and normal.  Gait normal      Skin: Intact without suspicious lesions or rashes. Lymph: No cervical, axillary lymphadenopathy present. Psych: Mood and affect are normal. Normally interactive                                                                                      Assessment & Plan:  See Current Assessment & Plan in  Problem List under specific Diagnosis

## 2014-11-25 NOTE — Progress Notes (Signed)
Pre visit review using our clinic review tool, if applicable. No additional management support is needed unless otherwise documented below in the visit note. 

## 2014-11-27 ENCOUNTER — Other Ambulatory Visit: Payer: Self-pay | Admitting: Internal Medicine

## 2014-11-27 DIAGNOSIS — E559 Vitamin D deficiency, unspecified: Secondary | ICD-10-CM

## 2014-11-27 DIAGNOSIS — R7989 Other specified abnormal findings of blood chemistry: Secondary | ICD-10-CM | POA: Insufficient documentation

## 2014-12-03 ENCOUNTER — Other Ambulatory Visit: Payer: Self-pay | Admitting: Internal Medicine

## 2014-12-22 ENCOUNTER — Encounter: Payer: Self-pay | Admitting: Pulmonary Disease

## 2014-12-22 ENCOUNTER — Ambulatory Visit (INDEPENDENT_AMBULATORY_CARE_PROVIDER_SITE_OTHER): Payer: 59 | Admitting: Pulmonary Disease

## 2014-12-22 ENCOUNTER — Other Ambulatory Visit: Payer: Self-pay | Admitting: Internal Medicine

## 2014-12-22 VITALS — BP 130/70 | HR 70 | Temp 97.0°F | Ht 64.0 in | Wt 213.4 lb

## 2014-12-22 DIAGNOSIS — G4733 Obstructive sleep apnea (adult) (pediatric): Secondary | ICD-10-CM

## 2014-12-22 NOTE — Patient Instructions (Signed)
Continue on cpap, and keep up with mask changes and supplies.  Take a look at the Dreamwear mask by [employer]. Work on weight reduction followup with me again in one year if doing well.

## 2014-12-22 NOTE — Assessment & Plan Note (Signed)
The patient continues to do fairly well with C Pap, and feels that it is continuing to help her sleep and daytime alertness. She is doing okay with her current mask, but would certainly consider a different type if it would be more comfortable for her. I have shown her some of the newer masks in the office, and she will discuss with her home care company. Finally, I have asked her to work aggressively on weight loss.

## 2014-12-22 NOTE — Progress Notes (Signed)
   Subjective:    Patient ID: Kristine Olson, female    DOB: 06/01/1946, 69 y.o.   MRN: [id number]  HPI The patient comes in today for follow-up of her known obstructive sleep apnea. Wearing C Pap compliantly, and feels that it continues to help her sleep. She continues to have issues at times with her mask, but her current one is not too bad. She has been keeping up with her filters and supplies since last visit. Also note, her weight is down about 3 pounds the last visit.   Review of Systems  Constitutional: Negative for fever and unexpected weight change.  HENT: Negative for congestion, dental problem, ear pain, nosebleeds, postnasal drip, rhinorrhea, sinus pressure, sneezing, sore throat and trouble swallowing.   Eyes: Negative for redness and itching.  Respiratory: Negative for cough, chest tightness, shortness of breath and wheezing.   Cardiovascular: Negative for palpitations and leg swelling.  Gastrointestinal: Negative for nausea and vomiting.  Genitourinary: Negative for dysuria.  Musculoskeletal: Negative for joint swelling.  Skin: Negative for rash.  Neurological: Negative for headaches.  Hematological: Does not bruise/bleed easily.  Psychiatric/Behavioral: Negative for dysphoric mood. The patient is not nervous/anxious.        Objective:   Physical Exam Overweight female in no acute distress Nose without purulence or discharge noted No skin breakdown or pressure necrosis from the C Pap mask Neck without lymphadenopathy or thyromegaly Lower extremities without edema, no cyanosis Alert and oriented, moves all 4 extremities.       Assessment & Plan:

## 2014-12-28 ENCOUNTER — Other Ambulatory Visit: Payer: Self-pay | Admitting: Internal Medicine

## 2015-03-01 ENCOUNTER — Other Ambulatory Visit: Payer: Self-pay | Admitting: Internal Medicine

## 2015-03-29 ENCOUNTER — Other Ambulatory Visit: Payer: Self-pay | Admitting: Internal Medicine

## 2015-04-05 ENCOUNTER — Ambulatory Visit: Payer: Self-pay | Admitting: Podiatry

## 2015-04-20 ENCOUNTER — Ambulatory Visit (INDEPENDENT_AMBULATORY_CARE_PROVIDER_SITE_OTHER): Payer: Medicare Other | Admitting: Podiatry

## 2015-04-20 ENCOUNTER — Encounter: Payer: Self-pay | Admitting: Podiatry

## 2015-04-20 VITALS — BP 124/79 | HR 60 | Resp 12

## 2015-04-20 DIAGNOSIS — L609 Nail disorder, unspecified: Secondary | ICD-10-CM | POA: Diagnosis not present

## 2015-04-20 DIAGNOSIS — L608 Other nail disorders: Secondary | ICD-10-CM

## 2015-04-20 NOTE — Patient Instructions (Signed)
Okay to apply nail polish to the right and left great toenails Remove nail polishe in 3 months and if you notice texture or color changes in the nails return for fungal culture and PAS stain

## 2015-04-20 NOTE — Progress Notes (Signed)
   Subjective:    Patient ID: Kristine Olson, female    DOB: Apr 19, 1946, 69 y.o.   MRN: [id number]  HPI  N- SORE L-B/L GREAT TOENAILS D-2 MONTHS C-WORSE O-SLOWLY A-NONE T-NONE  Patient has noticed some slight discolored station distal hallux nails the last several months without any active treatment. She would like to have this evaluated. She denies any symptoms associated with these areas.  Review of Systems  Skin: Positive for color change.       Objective:   Physical Exam  Orientated 3  Vascular: No peripheral edema noted bilaterally DP and PT pulses 2/4 bilaterally Capillary reflex immediate bilaterally  Neurological: Sensation to 10 g monofilament wire intact 5/5 bilaterally Vibratory sensation intact bilaterally Ankle reflex equal and reactive bilaterally  Dermatological: The distal right and left hallux 3 edges are elongated and partially lifting off the nailbed without any obvious subungual debris or hypertrophy  Musculoskeletal: Hammertoe deformity second right Is no restriction ankle, subtalar, midtarsal joints bilaterally       Assessment & Plan:   Assessment: Satisfactory neurovascular status The distal aspect right and left hallux nail plates may have beginning onychomycosis, however, it is unclear as to whether this is associated with nail polish  Plan: Debrided distal right and left hallux nails. There was no hypertrophy or debris beneath the nail plates. I made patient aware that I was unsure whether she just had nail changes  from nail polish versus possible fungal infection  At this time I recommended that she does allow the nail to regrow and if the nail plate continues to have color changes to return and I would do a definitive fungal culture and PAS stain. Made aware not to debride her remove any the subungual debris if she presented again  Reappoint at patient's request

## 2015-05-25 ENCOUNTER — Encounter: Payer: Self-pay | Admitting: Cardiology

## 2015-06-25 ENCOUNTER — Telehealth: Payer: Self-pay | Admitting: Internal Medicine

## 2015-06-25 NOTE — Telephone Encounter (Signed)
Pt called in has a few questions about her AVS from last visit and would like to speak to nurse when you get a chance .

## 2015-06-25 NOTE — Telephone Encounter (Signed)
Notified pt with md response.../lmb 

## 2015-06-25 NOTE — Telephone Encounter (Signed)
Take 1000 international units of vitamin D every day; recheck the vitamin D level after 4 months

## 2015-06-25 NOTE — Telephone Encounter (Signed)
Spoke with pt. She did not pay attention to the result notes on her labs back in Feb and has never started taking Vit D. Pt is asking if she should just take Vit D3 1000 or should she have a stronger strength? Please advice

## 2015-07-05 ENCOUNTER — Other Ambulatory Visit: Payer: Self-pay | Admitting: Internal Medicine

## 2015-09-01 ENCOUNTER — Other Ambulatory Visit: Payer: Self-pay | Admitting: Internal Medicine

## 2015-09-06 ENCOUNTER — Encounter: Payer: Self-pay | Admitting: Pulmonary Disease

## 2015-10-04 ENCOUNTER — Other Ambulatory Visit: Payer: Self-pay | Admitting: Internal Medicine

## 2015-11-29 ENCOUNTER — Other Ambulatory Visit: Payer: Self-pay | Admitting: Internal Medicine

## 2015-11-29 ENCOUNTER — Telehealth: Payer: Self-pay | Admitting: Emergency Medicine

## 2015-11-29 NOTE — Telephone Encounter (Signed)
Spoke with pt, she stated that she will be transferring to Dr Birdie Riddle. I advised her to call her office to have a refill sent in to pharm.

## 2015-12-03 ENCOUNTER — Encounter: Payer: Self-pay | Admitting: Family Medicine

## 2015-12-03 ENCOUNTER — Other Ambulatory Visit: Payer: Self-pay

## 2015-12-03 ENCOUNTER — Ambulatory Visit (INDEPENDENT_AMBULATORY_CARE_PROVIDER_SITE_OTHER): Payer: Medicare Other | Admitting: Family Medicine

## 2015-12-03 VITALS — BP 128/90 | HR 71 | Temp 97.8°F | Ht 64.0 in | Wt 212.8 lb

## 2015-12-03 DIAGNOSIS — K649 Unspecified hemorrhoids: Secondary | ICD-10-CM | POA: Diagnosis not present

## 2015-12-03 DIAGNOSIS — E038 Other specified hypothyroidism: Secondary | ICD-10-CM

## 2015-12-03 DIAGNOSIS — E559 Vitamin D deficiency, unspecified: Secondary | ICD-10-CM

## 2015-12-03 DIAGNOSIS — I10 Essential (primary) hypertension: Secondary | ICD-10-CM | POA: Diagnosis not present

## 2015-12-03 LAB — LIPID PANEL
CHOL/HDL RATIO: 2
Cholesterol: 184 mg/dL (ref 0–200)
HDL: 74 mg/dL (ref >39.00)
LDL CALC: 97 mg/dL (ref 0–99)
NONHDL: 110.32
TRIGLYCERIDES: 66 mg/dL (ref 0.0–149.0)
VLDL: 13.2 mg/dL (ref 0.0–40.0)

## 2015-12-03 LAB — HEPATIC FUNCTION PANEL
ALBUMIN: 3.9 g/dL (ref 3.5–5.2)
ALK PHOS: 51 U/L (ref 39–117)
ALT: 12 U/L (ref 0–35)
AST: 17 U/L (ref 0–37)
BILIRUBIN DIRECT: 0.1 mg/dL (ref 0.0–0.3)
BILIRUBIN TOTAL: 0.5 mg/dL (ref 0.2–1.2)
Total Protein: 7.3 g/dL (ref 6.0–8.3)

## 2015-12-03 LAB — BASIC METABOLIC PANEL
BUN: 20 mg/dL (ref 6–23)
CO2: 28 mEq/L (ref 19–32)
Calcium: 9.5 mg/dL (ref 8.4–10.5)
Chloride: 104 mEq/L (ref 96–112)
Creatinine, Ser: 1.22 mg/dL — ABNORMAL HIGH (ref 0.40–1.20)
GFR: 46.66 mL/min — AB (ref >60.00)
Glucose, Bld: 84 mg/dL (ref 70–99)
Potassium: 4.6 mEq/L (ref 3.5–5.1)
Sodium: 139 mEq/L (ref 135–145)

## 2015-12-03 LAB — VITAMIN D 25 HYDROXY (VIT D DEFICIENCY, FRACTURES): VITD: 23.04 ng/mL — ABNORMAL LOW (ref 30.00–100.00)

## 2015-12-03 LAB — TSH: TSH: 3.35 u[IU]/mL (ref 0.35–4.50)

## 2015-12-03 MED ORDER — METOPROLOL TARTRATE 25 MG PO TABS: ORAL_TABLET | ORAL | Status: DC

## 2015-12-03 MED ORDER — LEVOTHYROXINE SODIUM 112 MCG PO TABS: ORAL_TABLET | ORAL | Status: DC

## 2015-12-03 MED ORDER — SPIRONOLACTONE 25 MG PO TABS: 25.0000 mg | ORAL_TABLET | Freq: Every day | ORAL | Status: DC

## 2015-12-03 MED ORDER — HYDROCORTISONE ACE-PRAMOXINE 2.5-1 % RE CREA: 1.0000 "application " | TOPICAL_CREAM | Freq: Three times a day (TID) | RECTAL | Status: DC

## 2015-12-03 NOTE — Assessment & Plan Note (Signed)
Check labs.  Replete prn. 

## 2015-12-03 NOTE — Assessment & Plan Note (Signed)
Chronic problem.  Adequate control.  Asymptomatic at this time.  Pt admits to sporadic use of Spironolactone.  Discussed need for healthy diet and regular exercise.  Check labs.  No anticipated med changes.

## 2015-12-03 NOTE — Assessment & Plan Note (Signed)
New.  Pt reports recent constipation and hx of hemorrhoids.  No bleeding or pain at this time.  Start Analpram and if no improvement pt to f/u w/ GI.  Pt expressed understanding and is in agreement w/ plan.

## 2015-12-03 NOTE — Progress Notes (Signed)
   Subjective:    Patient ID: Kristine Olson, female    DOB: 1946/06/07, 70 y.o.   MRN: [id number]  HPI HTN- chronic problem, adequate control on Metoprolol and Spironolactone.  No CP, SOB, HAs, visual changes, edema  Hypothyroid- chronic problem, on Levothyroxine.  Currently on 1 tab daily w/ 1.5 tabs on Wed.  Denies excessive fatigue.  No changes to skin/hair/nails.  + constipation.  Vit D deficiency- due for repeat lab  Hemorrhoid- pt reports recent constipation w/ no regular BM in last 3-4 days.  Hemorrhoid is not painful or bleeding at this time.  Sees Dr Carlean Purl.  Using Tucks pads.   Review of Systems For ROS see HPI     Objective:   Physical Exam  Constitutional: She is oriented to person, place, and time. She appears well-developed and well-nourished. No distress.  obese  HENT:  Head: Normocephalic and atraumatic.  Eyes: Conjunctivae and EOM are normal. Pupils are equal, round, and reactive to light.  Neck: Normal range of motion. Neck supple. No thyromegaly present.  Cardiovascular: Normal rate, regular rhythm, normal heart sounds and intact distal pulses.   No murmur heard. Pulmonary/Chest: Effort normal and breath sounds normal. No respiratory distress.  Abdominal: Soft. She exhibits no distension. There is no tenderness.  Musculoskeletal: She exhibits no edema.  Lymphadenopathy:    She has no cervical adenopathy.  Neurological: She is alert and oriented to person, place, and time.  Skin: Skin is warm and dry.  Psychiatric: She has a normal mood and affect. Her behavior is normal.  Vitals reviewed.         Assessment & Plan:

## 2015-12-03 NOTE — Assessment & Plan Note (Signed)
Chronic problem.  Synthroid dose is not traditional and may not be accurate since pt has to break pill- but she seems pleased w/ it.  Check labs.  Adjust meds prn.

## 2015-12-03 NOTE — Progress Notes (Signed)
Pre visit review using our clinic review tool, if applicable. No additional management support is needed unless otherwise documented below in the visit note. 

## 2015-12-03 NOTE — Patient Instructions (Signed)
Follow up as scheduled We'll notify you of your lab results and make any changes if needed Continue to work on healthy diet and regular exercise- you can do it! Increase water, fiber, and activity to improve bowel habits Miralax as needed for intermittent constipation Use the Analpram 3x/day to improve the hemorrhoid Call with any questions or concerns Welcome!  We're glad to have you! Happy Valentine's Day!

## 2015-12-05 LAB — CBC WITH DIFFERENTIAL/PLATELET
Basophils Absolute: 0 10*3/uL (ref 0.0–0.1)
Basophils Relative: 0.6 % (ref 0.0–3.0)
EOS PCT: 4.4 % (ref 0.0–5.0)
Eosinophils Absolute: 0.3 10*3/uL (ref 0.0–0.7)
HCT: 44.3 % (ref 36.0–46.0)
HEMOGLOBIN: 13.4 g/dL (ref 12.0–15.0)
LYMPHS ABS: 1.5 10*3/uL (ref 0.7–4.0)
Lymphocytes Relative: 21.8 % (ref 12.0–46.0)
MCHC: 30.2 g/dL (ref 30.0–36.0)
MCV: 96.9 fl (ref 78.0–100.0)
MONOS PCT: 5.7 % (ref 3.0–12.0)
Monocytes Absolute: 0.4 10*3/uL (ref 0.1–1.0)
NEUTROS PCT: 67.5 % (ref 43.0–77.0)
Neutro Abs: 4.8 10*3/uL (ref 1.4–7.7)
Platelets: 236 10*3/uL (ref 150.0–400.0)
RBC: 4.57 Mil/uL (ref 3.87–5.11)
RDW: 14.6 % (ref 11.5–15.5)
WBC: 7.1 10*3/uL (ref 4.0–10.5)

## 2015-12-06 ENCOUNTER — Other Ambulatory Visit: Payer: Self-pay | Admitting: General Practice

## 2015-12-06 MED ORDER — VITAMIN D (ERGOCALCIFEROL) 1.25 MG (50000 UNIT) PO CAPS: 50000.0000 [IU] | ORAL_CAPSULE | ORAL | Status: DC

## 2015-12-22 ENCOUNTER — Ambulatory Visit: Payer: 59 | Admitting: Pulmonary Disease

## 2016-01-12 ENCOUNTER — Ambulatory Visit: Payer: Medicare Other | Admitting: Family Medicine

## 2016-02-08 ENCOUNTER — Other Ambulatory Visit: Payer: Self-pay | Admitting: Orthopedic Surgery

## 2016-02-08 DIAGNOSIS — M1711 Unilateral primary osteoarthritis, right knee: Secondary | ICD-10-CM

## 2016-02-14 ENCOUNTER — Ambulatory Visit
Admission: RE | Admit: 2016-02-14 | Discharge: 2016-02-14 | Disposition: A | Discharge status: Home / Self Care | Payer: Medicare Other | Source: Ambulatory Visit | Attending: Orthopedic Surgery | Admitting: Orthopedic Surgery

## 2016-02-14 DIAGNOSIS — M1711 Unilateral primary osteoarthritis, right knee: Secondary | ICD-10-CM

## 2016-03-02 ENCOUNTER — Other Ambulatory Visit: Payer: Self-pay | Admitting: General Practice

## 2016-03-02 MED ORDER — METOPROLOL TARTRATE 25 MG PO TABS: ORAL_TABLET | ORAL | Status: DC

## 2016-04-01 ENCOUNTER — Telehealth: Payer: Self-pay

## 2016-04-01 NOTE — Telephone Encounter (Signed)
Patient is on the list for [employer] 2017 and may be a good candidate for an AWV in 2017. Please let me know if/when appt is scheduled.   

## 2016-04-02 ENCOUNTER — Ambulatory Visit: Payer: Self-pay | Admitting: Orthopedic Surgery

## 2016-04-03 NOTE — Telephone Encounter (Signed)
Levada Dy, Please reach to the patient to schedule and let Stefannie know the pending date.  Thank you!

## 2016-04-04 NOTE — Telephone Encounter (Signed)
Pt has already been scheduled for AWV on 04/17/16.

## 2016-04-17 ENCOUNTER — Encounter: Payer: Self-pay | Admitting: Family Medicine

## 2016-04-17 ENCOUNTER — Ambulatory Visit (INDEPENDENT_AMBULATORY_CARE_PROVIDER_SITE_OTHER): Payer: Medicare Other | Admitting: Family Medicine

## 2016-04-17 VITALS — BP 124/88 | HR 82 | Temp 98.2°F | Resp 16 | Ht 64.0 in | Wt 214.5 lb

## 2016-04-17 DIAGNOSIS — Z23 Encounter for immunization: Secondary | ICD-10-CM | POA: Diagnosis not present

## 2016-04-17 DIAGNOSIS — E559 Vitamin D deficiency, unspecified: Secondary | ICD-10-CM

## 2016-04-17 DIAGNOSIS — E785 Hyperlipidemia, unspecified: Secondary | ICD-10-CM

## 2016-04-17 DIAGNOSIS — Z0000 Encounter for general adult medical examination without abnormal findings: Secondary | ICD-10-CM

## 2016-04-17 DIAGNOSIS — I10 Essential (primary) hypertension: Secondary | ICD-10-CM | POA: Diagnosis not present

## 2016-04-17 DIAGNOSIS — E038 Other specified hypothyroidism: Secondary | ICD-10-CM

## 2016-04-17 LAB — HEPATIC FUNCTION PANEL
ALBUMIN: 4.1 g/dL (ref 3.5–5.2)
ALT: 12 U/L (ref 0–35)
AST: 16 U/L (ref 0–37)
Alkaline Phosphatase: 54 U/L (ref 39–117)
BILIRUBIN DIRECT: 0.1 mg/dL (ref 0.0–0.3)
Total Bilirubin: 0.4 mg/dL (ref 0.2–1.2)
Total Protein: 6.8 g/dL (ref 6.0–8.3)

## 2016-04-17 LAB — CBC WITH DIFFERENTIAL/PLATELET
BASOS ABS: 0 10*3/uL (ref 0.0–0.1)
Basophils Relative: 0.6 % (ref 0.0–3.0)
Eosinophils Absolute: 0.2 10*3/uL (ref 0.0–0.7)
Eosinophils Relative: 2.6 % (ref 0.0–5.0)
HEMATOCRIT: 41 % (ref 36.0–46.0)
HEMOGLOBIN: 13.4 g/dL (ref 12.0–15.0)
LYMPHS PCT: 17.1 % (ref 12.0–46.0)
Lymphs Abs: 1.3 10*3/uL (ref 0.7–4.0)
MCHC: 32.8 g/dL (ref 30.0–36.0)
MCV: 88.4 fl (ref 78.0–100.0)
MONOS PCT: 6.2 % (ref 3.0–12.0)
Monocytes Absolute: 0.5 10*3/uL (ref 0.1–1.0)
Neutro Abs: 5.5 10*3/uL (ref 1.4–7.7)
Neutrophils Relative %: 73.5 % (ref 43.0–77.0)
Platelets: 209 10*3/uL (ref 150.0–400.0)
RBC: 4.64 Mil/uL (ref 3.87–5.11)
RDW: 14.1 % (ref 11.5–15.5)
WBC: 7.5 10*3/uL (ref 4.0–10.5)

## 2016-04-17 LAB — LIPID PANEL
CHOLESTEROL: 183 mg/dL (ref 0–200)
HDL: 72 mg/dL (ref >39.00)
LDL CALC: 91 mg/dL (ref 0–99)
NonHDL: 111.02
Total CHOL/HDL Ratio: 3
Triglycerides: 100 mg/dL (ref 0.0–149.0)
VLDL: 20 mg/dL (ref 0.0–40.0)

## 2016-04-17 LAB — BASIC METABOLIC PANEL
BUN: 16 mg/dL (ref 6–23)
CALCIUM: 9.8 mg/dL (ref 8.4–10.5)
CO2: 29 mEq/L (ref 19–32)
Chloride: 103 mEq/L (ref 96–112)
Creatinine, Ser: 1.18 mg/dL (ref 0.40–1.20)
GFR: 48.44 mL/min — AB (ref >60.00)
GLUCOSE: 97 mg/dL (ref 70–99)
Potassium: 5 mEq/L (ref 3.5–5.1)
SODIUM: 139 meq/L (ref 135–145)

## 2016-04-17 LAB — TSH: TSH: 0.94 u[IU]/mL (ref 0.35–4.50)

## 2016-04-17 LAB — VITAMIN D 25 HYDROXY (VIT D DEFICIENCY, FRACTURES): VITD: 29.98 ng/mL — ABNORMAL LOW (ref 30.00–100.00)

## 2016-04-17 NOTE — Assessment & Plan Note (Signed)
Chronic problem, currently asymptomatic.  On Levothyroxine daily.  Check labs.  Adjust meds prn

## 2016-04-17 NOTE — Addendum Note (Signed)
Addended by: Davis Gourd on: 04/17/2016 09:52 AM   Modules accepted: Orders

## 2016-04-17 NOTE — Progress Notes (Signed)
   Subjective:    Patient ID: Kristine Olson, female    DOB: [birth year], 70 y.o.   MRN: [id number]  HPI Here today for CPE.  Risk Factors: HTN- chornic problem, on Metoprolol and Spironolactone. Hyperlipidemia- chronic problem.  Not currently on medication- attempting to control w/ diet Hypothyroid- chronic problem, on Levothyroxine 123mcg daily.   Vit D deficiency- pt has ongoing low Vit D.  She started 50,000 units weekly in Feb.  Due for recheck. Physical Activity: limited due to current knee pain Fall Risk: mildly elevated due to ongoing knee issue Depression: chronic problem, on Celexa w/ good control.   Hearing: normal to conversational tones and whispered voice at 6 ft ADL's: independent Cognitive: normal linear thought process, memory and attention intact Home Safety: safe at home Height, Weight, BMI, Visual Acuity: see vitals, vision corrected to 20/20 w/ glasses Counseling:  UTD on colonoscopy (due 2020), due for mammo/DEXA (through GYN).  Due for [facility] team reviewed and updated Labs Ordered: See A&P Care Plan: See A&P    Review of Systems Patient reports no vision/ hearing changes, adenopathy,fever, weight change,  persistant/recurrent hoarseness , swallowing issues, chest pain, palpitations, edema, persistant/recurrent cough, hemoptysis, dyspnea (rest/exertional/paroxysmal nocturnal), gastrointestinal bleeding (melena, rectal bleeding), abdominal pain, significant heartburn, bowel changes, Gyn symptoms (abnormal  bleeding, pain),  syncope, focal weakness, memory loss, numbness & tingling, skin/hair/nail changes, abnormal bruising or bleeding, anxiety, or depression.   + urinary incontinence    Objective:   Physical Exam General Appearance:    Alert, cooperative, no distress, appears stated age, obese  Head:    Normocephalic, without obvious abnormality, atraumatic  Eyes:    PERRL, conjunctiva/corneas clear, EOM's intact, fundi    benign, both eyes  Ears:     Normal TM's and external ear canals, both ears  Nose:   Nares normal, septum midline, mucosa normal, no drainage    or sinus tenderness  Throat:   Lips, mucosa, and tongue normal; teeth and gums normal  Neck:   Supple, symmetrical, trachea midline, no adenopathy;    Thyroid: no enlargement/tenderness/nodules  Back:     Symmetric, no curvature, ROM normal, no CVA tenderness  Lungs:     Clear to auscultation bilaterally, respirations unlabored  Chest Wall:    No tenderness or deformity   Heart:    Regular rate and rhythm, S1 and S2 normal, no murmur, rub   or gallop  Breast Exam:    Deferred to GYN  Abdomen:     Soft, non-tender, bowel sounds active all four quadrants,    no masses, no organomegaly  Genitalia:    Deferred to GYN  Rectal:    Extremities:   Extremities normal, atraumatic, no cyanosis or edema  Pulses:   2+ and symmetric all extremities  Skin:   Skin color, texture, turgor normal, no rashes or lesions  Lymph nodes:   Cervical, supraclavicular, and axillary nodes normal  Neurologic:   CNII-XII intact, normal strength, sensation and reflexes    throughout          Assessment & Plan:

## 2016-04-17 NOTE — Assessment & Plan Note (Signed)
Chronic problem, well controlled.  Asymptomatic.  Check labs.  No anticipated med changes. °

## 2016-04-17 NOTE — Assessment & Plan Note (Signed)
Pt's PE WNL w/ exception of obesity.  UTD on colonoscopy- due 2020.  UTD on GYN, due for mammo and DEXA- pt plans to schedule.  Written screening schedule updated and given to pt.  Check labs.  Anticipatory guidance provided.

## 2016-04-17 NOTE — Assessment & Plan Note (Signed)
Chronic problem, attempting to control w/ healthy diet.  Not currently on meds.  Check labs.  Adjust meds prn

## 2016-04-17 NOTE — Progress Notes (Signed)
Pre visit review using our clinic review tool, if applicable. No additional management support is needed unless otherwise documented below in the visit note. 

## 2016-04-17 NOTE — Assessment & Plan Note (Signed)
Ongoing issue for pt.  She completed course of 50,000 units weekly.  Due for repeat labs.  Will replete prn.

## 2016-04-17 NOTE — Patient Instructions (Signed)
Follow up in 6 months to recheck BP and cholesterol We'll notify you of your lab results and make any changes if needed Continue to work on healthy diet and exercise (once you are able!) You are up to date on colonoscopy- due 2020 Please reschedule your mammogram You received the Prevnar today Call with any questions or concerns GOOD LUCK WITH SURGERY!!!

## 2016-04-18 ENCOUNTER — Encounter: Payer: Self-pay | Admitting: Family Medicine

## 2016-04-18 ENCOUNTER — Other Ambulatory Visit: Payer: Self-pay | Admitting: Family Medicine

## 2016-04-18 MED ORDER — LEVOTHYROXINE SODIUM 112 MCG PO TABS: ORAL_TABLET | ORAL | Status: DC

## 2016-04-18 NOTE — Telephone Encounter (Signed)
Medication filled to pharmacy as requested.   

## 2016-05-16 ENCOUNTER — Encounter (HOSPITAL_COMMUNITY): Payer: Self-pay

## 2016-05-16 ENCOUNTER — Encounter (HOSPITAL_COMMUNITY)
Admission: RE | Admit: 2016-05-16 | Discharge: 2016-05-16 | Disposition: A | Discharge status: Home / Self Care | Payer: Medicare Other | Source: Ambulatory Visit | Attending: Orthopedic Surgery | Admitting: Orthopedic Surgery

## 2016-05-16 DIAGNOSIS — Z01810 Encounter for preprocedural cardiovascular examination: Secondary | ICD-10-CM | POA: Diagnosis present

## 2016-05-16 DIAGNOSIS — I10 Essential (primary) hypertension: Secondary | ICD-10-CM | POA: Insufficient documentation

## 2016-05-16 DIAGNOSIS — M1711 Unilateral primary osteoarthritis, right knee: Secondary | ICD-10-CM | POA: Insufficient documentation

## 2016-05-16 DIAGNOSIS — R001 Bradycardia, unspecified: Secondary | ICD-10-CM | POA: Insufficient documentation

## 2016-05-16 DIAGNOSIS — Z01812 Encounter for preprocedural laboratory examination: Secondary | ICD-10-CM | POA: Diagnosis not present

## 2016-05-16 HISTORY — DX: Major depressive disorder, single episode, unspecified: F32.9

## 2016-05-16 HISTORY — DX: Depression, unspecified: F32.A

## 2016-05-16 HISTORY — DX: Gastro-esophageal reflux disease without esophagitis: K21.9

## 2016-05-16 HISTORY — DX: Unspecified osteoarthritis, unspecified site: M19.90

## 2016-05-16 LAB — URINALYSIS, ROUTINE W REFLEX MICROSCOPIC
BILIRUBIN URINE: NEGATIVE
Glucose, UA: NEGATIVE mg/dL
Hgb urine dipstick: NEGATIVE
KETONES UR: NEGATIVE mg/dL
NITRITE: NEGATIVE
PROTEIN: NEGATIVE mg/dL
Specific Gravity, Urine: 1.016 (ref 1.005–1.030)
pH: 7.5 (ref 5.0–8.0)

## 2016-05-16 LAB — SURGICAL PCR SCREEN
MRSA, PCR: NEGATIVE
Staphylococcus aureus: NEGATIVE

## 2016-05-16 LAB — CBC
HEMATOCRIT: 39.8 % (ref 36.0–46.0)
Hemoglobin: 12.9 g/dL (ref 12.0–15.0)
MCH: 29 pg (ref 26.0–34.0)
MCHC: 32.4 g/dL (ref 30.0–36.0)
MCV: 89.4 fL (ref 78.0–100.0)
PLATELETS: 225 10*3/uL (ref 150–400)
RBC: 4.45 MIL/uL (ref 3.87–5.11)
RDW: 13.7 % (ref 11.5–15.5)
WBC: 7.7 10*3/uL (ref 4.0–10.5)

## 2016-05-16 LAB — COMPREHENSIVE METABOLIC PANEL
ALBUMIN: 4.1 g/dL (ref 3.5–5.0)
ALT: 13 U/L — AB (ref 14–54)
AST: 24 U/L (ref 15–41)
Alkaline Phosphatase: 58 U/L (ref 38–126)
Anion gap: 5 (ref 5–15)
BILIRUBIN TOTAL: 0.7 mg/dL (ref 0.3–1.2)
BUN: 16 mg/dL (ref 6–20)
CHLORIDE: 105 mmol/L (ref 101–111)
CO2: 29 mmol/L (ref 22–32)
CREATININE: 1.19 mg/dL — AB (ref 0.44–1.00)
Calcium: 9.8 mg/dL (ref 8.9–10.3)
GFR calc Af Amer: 54 mL/min — ABNORMAL LOW (ref >60)
GFR calc non Af Amer: 46 mL/min — ABNORMAL LOW (ref >60)
GLUCOSE: 91 mg/dL (ref 65–99)
POTASSIUM: 6 mmol/L — AB (ref 3.5–5.1)
Sodium: 139 mmol/L (ref 135–145)
Total Protein: 7.8 g/dL (ref 6.5–8.1)

## 2016-05-16 LAB — URINE MICROSCOPIC-ADD ON

## 2016-05-16 LAB — ABO/RH: ABO/RH(D): A POS

## 2016-05-16 LAB — APTT: APTT: 32 s (ref 24–37)

## 2016-05-16 LAB — PROTIME-INR
INR: 1.13 (ref 0.00–1.49)
Prothrombin Time: 14.2 seconds (ref 11.6–15.2)

## 2016-05-16 NOTE — Patient Instructions (Signed)
Kristine Olson  05/16/2016   Your procedure is scheduled on: 05-22-16  Report to [facility]  Entrance take [facility]  elevators to 3rd floor to  [place] at   1200 noon.  Call this number if you have problems the morning of surgery [phone]   Remember: ONLY 1 PERSON MAY GO WITH YOU TO SHORT STAY TO GET  READY MORNING OF YOUR SURGERY.  Do not eat food or drink liquids :After Midnight.     Take these medicines the morning of surgery with A SIP OF WATER: Levothyroxine. Bring cpap mask/tubing. DO NOT TAKE ANY DIABETIC MEDICATIONS DAY OF YOUR SURGERY                               You may not have any metal on your body including hair pins and              piercings  Do not wear jewelry, make-up, lotions, powders or perfumes, deodorant             Do not wear nail polish.  Do not shave  48 hours prior to surgery.              Men may shave face and neck.   Do not bring valuables to the hospital. [place].  Contacts, dentures or bridgework may not be worn into surgery.  Leave suitcase in the car. After surgery it may be brought to your room.     Patients discharged the day of surgery will not be allowed to drive home.  Name and phone number of your driver:spouse Eddie Dibbles -[phone]- [phone] cell  Special Instructions: N/A              Please read over the following fact sheets you were given: _____________________________________________________________________             [facility] - Preparing for Surgery Before surgery, you can play an important role.  Because skin is not sterile, your skin needs to be as free of germs as possible.  You can reduce the number of germs on your skin by washing with CHG (chlorahexidine gluconate) soap before surgery.  CHG is an antiseptic cleaner which kills germs and bonds with the skin to continue killing germs even after washing. Please DO NOT use if you have an  allergy to CHG or antibacterial soaps.  If your skin becomes reddened/irritated stop using the CHG and inform your nurse when you arrive at Short Stay. Do not shave (including legs and underarms) for at least 48 hours prior to the first CHG shower.  You may shave your face/neck. Please follow these instructions carefully:  1.  Shower with CHG Soap the night before surgery and the  morning of Surgery.  2.  If you choose to wash your hair, wash your hair first as usual with your  normal  shampoo.  3.  After you shampoo, rinse your hair and body thoroughly to remove the  shampoo.                           4.  Use CHG as you would any other liquid soap.  You can apply chg  directly  to the skin and wash                       Gently with a scrungie or clean washcloth.  5.  Apply the CHG Soap to your body ONLY FROM THE NECK DOWN.   Do not use on face/ open                           Wound or open sores. Avoid contact with eyes, ears mouth and genitals (private parts).                       Wash face,  Genitals (private parts) with your normal soap.             6.  Wash thoroughly, paying special attention to the area where your surgery  will be performed.  7.  Thoroughly rinse your body with warm water from the neck down.  8.  DO NOT shower/wash with your normal soap after using and rinsing off  the CHG Soap.                9.  Pat yourself dry with a clean towel.            10.  Wear clean pajamas.            11.  Place clean sheets on your bed the night of your first shower and do not  sleep with pets. Day of Surgery : Do not apply any lotions/deodorants the morning of surgery.  Please wear clean clothes to the hospital/surgery center.  FAILURE TO FOLLOW THESE INSTRUCTIONS MAY RESULT IN THE CANCELLATION OF YOUR SURGERY PATIENT SIGNATURE_________________________________  NURSE  SIGNATURE__________________________________  ________________________________________________________________________   Adam Phenix  An incentive spirometer is a tool that can help keep your lungs clear and active. This tool measures how well you are filling your lungs with each breath. Taking long deep breaths may help reverse or decrease the chance of developing breathing (pulmonary) problems (especially infection) following:  A long period of time when you are unable to move or be active. BEFORE THE PROCEDURE   If the spirometer includes an indicator to show your best effort, your nurse or respiratory therapist will set it to a desired goal.  If possible, sit up straight or lean slightly forward. Try not to slouch.  Hold the incentive spirometer in an upright position. INSTRUCTIONS FOR USE  1. Sit on the edge of your bed if possible, or sit up as far as you can in bed or on a chair. 2. Hold the incentive spirometer in an upright position. 3. Breathe out normally. 4. Place the mouthpiece in your mouth and seal your lips tightly around it. 5. Breathe in slowly and as deeply as possible, raising the piston or the ball toward the top of the column. 6. Hold your breath for 3-5 seconds or for as long as possible. Allow the piston or ball to fall to the bottom of the column. 7. Remove the mouthpiece from your mouth and breathe out normally. 8. Rest for a few seconds and repeat Steps 1 through 7 at least 10 times every 1-2 hours when you are awake. Take your time and take a few normal breaths between deep breaths. 9. The spirometer may include an indicator to show your best effort. Use the indicator as a goal to work toward during each repetition. 10.  After each set of 10 deep breaths, practice coughing to be sure your lungs are clear. If you have an incision (the cut made at the time of surgery), support your incision when coughing by placing a pillow or rolled up towels firmly  against it. Once you are able to get out of bed, walk around indoors and cough well. You may stop using the incentive spirometer when instructed by your caregiver.  RISKS AND COMPLICATIONS  Take your time so you do not get dizzy or light-headed.  If you are in pain, you may need to take or ask for pain medication before doing incentive spirometry. It is harder to take a deep breath if you are having pain. AFTER USE  Rest and breathe slowly and easily.  It can be helpful to keep track of a log of your progress. Your caregiver can provide you with a simple table to help with this. If you are using the spirometer at home, follow these instructions: [place] IF:   You are having difficultly using the spirometer.  You have trouble using the spirometer as often as instructed.  Your pain medication is not giving enough relief while using the spirometer.  You develop fever of 100.5 F (38.1 C) or higher. SEEK IMMEDIATE MEDICAL CARE IF:   You cough up bloody sputum that had not been present before.  You develop fever of 102 F (38.9 C) or greater.  You develop worsening pain at or near the incision site. MAKE SURE YOU:   Understand these instructions.  Will watch your condition.  Will get help right away if you are not doing well or get worse. Document Released: 02/19/2007 Document Revised: 01/01/2012 Document Reviewed: 04/22/2007 ExitCare Patient Information 2014 Exit[employer], [place].   ________________________________________________________________________  WHAT IS A BLOOD TRANSFUSION? Blood Transfusion Information  A transfusion is the replacement of blood or some of its parts. Blood is made up of multiple cells which provide different functions.  Red blood cells carry oxygen and are used for blood loss replacement.  White blood cells fight against infection.  Platelets control bleeding.  Plasma helps clot blood.  Other blood products are available for  specialized needs, such as hemophilia or other clotting disorders. BEFORE THE TRANSFUSION  Who gives blood for transfusions?   Healthy volunteers who are fully evaluated to make sure their blood is safe. This is blood bank blood. Transfusion therapy is the safest it has ever been in the practice of medicine. Before blood is taken from a donor, a complete history is taken to make sure that person has no history of diseases nor engages in risky social behavior (examples are intravenous drug use or sexual activity with multiple partners). The donor's travel history is screened to minimize risk of transmitting infections, such as malaria. The donated blood is tested for signs of infectious diseases, such as HIV and hepatitis. The blood is then tested to be sure it is compatible with you in order to minimize the chance of a transfusion reaction. If you or a relative donates blood, this is often done in anticipation of surgery and is not appropriate for emergency situations. It takes many days to process the donated blood. RISKS AND COMPLICATIONS Although transfusion therapy is very safe and saves many lives, the main dangers of transfusion include:   Getting an infectious disease.  Developing a transfusion reaction. This is an allergic reaction to something in the blood you were given. Every precaution is taken to prevent this. The decision  to have a blood transfusion has been considered carefully by your caregiver before blood is given. Blood is not given unless the benefits outweigh the risks. AFTER THE TRANSFUSION  Right after receiving a blood transfusion, you will usually feel much better and more energetic. This is especially true if your red blood cells have gotten low (anemic). The transfusion raises the level of the red blood cells which carry oxygen, and this usually causes an energy increase.  The nurse administering the transfusion will monitor you carefully for complications. HOME CARE  INSTRUCTIONS  No special instructions are needed after a transfusion. You may find your energy is better. Speak with your caregiver about any limitations on activity for underlying diseases you may have. SEEK MEDICAL CARE IF:   Your condition is not improving after your transfusion.  You develop redness or irritation at the intravenous (IV) site. SEEK IMMEDIATE MEDICAL CARE IF:  Any of the following symptoms occur over the next 12 hours:  Shaking chills.  You have a temperature by mouth above 102 F (38.9 C), not controlled by medicine.  Chest, back, or muscle pain.  People around you feel you are not acting correctly or are confused.  Shortness of breath or difficulty breathing.  Dizziness and fainting.  You get a rash or develop hives.  You have a decrease in urine output.  Your urine turns a dark color or changes to pink, red, or brown. Any of the following symptoms occur over the next 10 days:  You have a temperature by mouth above 102 F (38.9 C), not controlled by medicine.  Shortness of breath.  Weakness after normal activity.  The white part of the eye turns yellow (jaundice).  You have a decrease in the amount of urine or are urinating less often.  Your urine turns a dark color or changes to pink, red, or brown. Document Released: 10/06/2000 Document Revised: 01/01/2012 Document Reviewed: 05/25/2008 [facility] Patient Information 2014 [place], [place].  _______________________________________________________________________

## 2016-05-16 NOTE — Pre-Procedure Instructions (Signed)
05-16-16 1640 Note labs viewable in Epic- note CMP results-Potassium.

## 2016-05-16 NOTE — Pre-Procedure Instructions (Signed)
EKG done today.

## 2016-05-17 NOTE — Progress Notes (Signed)
05-17-16 1630 Fax received to repeat Potassium level AM  preop of pt arrival to short Stay -note on chart per Dr. Wynelle Link office.

## 2016-05-21 ENCOUNTER — Ambulatory Visit: Payer: Self-pay | Admitting: Orthopedic Surgery

## 2016-05-21 NOTE — H&P (Signed)
Kristine Olson DOB: Feb 23, 1946 Married / Language: English / Race: White Female Date of Admission:  05/22/2016 CC:  Right Knee Pain History of Present Illness The patient is a 70 year old female who comes in for a preoperative History and Physical. The patient is scheduled for a right unicompartmental knee replacement to be performed by Dr. Dione Plover. Aluisio, MD at [facility] on 05/22/2016. The patient is a 70 year old female who presented for follow up of their knee. The patient is being followed for their bilateral knee pain and osteoarthritis. They are now months out from Euflexxa series. Symptoms reported include: pain, aching and difficulty ambulating. The patient feels that they are doing poorly and report their pain level to be moderate to severe (worse with prolonged activity). Current treatment includes: activity modification. The following medication has been used for pain control: antiinflammatory medication (ibuprofen, prn). The patient has not gotten any relief of their symptoms with viscosupplementation. Right knee is what is giving her the most trouble. The viscosupplement injections did not help on the right side at all. She is satisfied with how she is doing on the left. She states that the right knee is bothering her with most activities. It is limiting what she can and cannot do. She would like to be more active but cannot be so because of the right knee. She is ready to proceed with surgery. They have been treated conservatively in the past for the above stated problem and despite conservative measures, they continue to have progressive pain and severe functional limitations and dysfunction. They have failed non-operative management including home exercise, medications, and injections. It is felt that they would benefit from undergoing unicompartmental joint replacement. Risks and benefits of the procedure have been discussed with the patient and they elect to proceed with  surgery. There are no active contraindications to surgery such as ongoing infection or rapidly progressive neurological disease.  Problem List/Past Medical Primary osteoarthritis of both knees (M17.0)  Depression  Gastroesophageal Reflux Disease  High blood pressure  Hypothyroidism  Irritable bowel syndrome  Hemorrhoids  occasional bleeding Sleep Apnea  uses CPAP Tinnitus  Varicose veins  Menopause  Osteopenia  Allergies Penicillamine *Assorted Classes  Rash. Ceclor *CEPHALOSPORINS*  Rash. Sulfamethoxazole *Sulfonamides**  Rash. Flonase *NASAL AGENTS - SYSTEMIC AND TOPICAL*  Did not tolerate  Family History  Cancer  Brother, Maternal Grandmother, Paternal Grandmother. Drug / Alcohol Addiction  Father. Heart Disease  Maternal Grandfather. Hypertension  Father, Mother. Liver Disease, Chronic  Father. Osteoarthritis  Maternal Grandmother. Osteoporosis  Mother. Rheumatoid Arthritis  Maternal Grandmother, Mother.  Social History Children  5 or more Current drinker  06/21/2015: Currently drinks wine only occasionally per week Current work status  retired Exercise  Exercises rarely; does running / walking Living situation  live with spouse Marital status  married No history of drug/alcohol rehab  Not under pain contract  Number of flights of stairs before winded  2-3 Tobacco use  Never smoker. 06/21/2015 Advance Directives  Living Will, Healthcare POA  Medication History Levothyroxine Sodium (112MCG Tablet, Oral) Active. Metoprolol Tartrate (25MG  Tablet, Oral) Active. Spironolactone (25MG  Tablet, Oral) Active. CeleXA (10MG  Tablet, Oral) Active. Vitamin D (1000UNIT Tablet, Oral) Active. Ibuprofen (200MG  Tablet, Oral) Active.   Past Surgical History Breast Biopsy  bilateral Cesarean Delivery  Date: 1982. 1 time Colon Polyp Removal - Colonoscopy  Tonsillectomy  Date: 1955.   Review of Systems General Not Present- Chills,  Fatigue, Fever, Memory Loss, Night Sweats, Weight Gain and  Weight Loss. Skin Not Present- Eczema, Hives, Itching, Lesions and Rash. HEENT Not Present- Dentures, Double Vision, Headache, Hearing Loss, Tinnitus and Visual Loss. Respiratory Not Present- Allergies, Chronic Cough, Coughing up blood, Shortness of breath at rest and Shortness of breath with exertion. Cardiovascular Not Present- Chest Pain, Difficulty Breathing Lying Down, Murmur, Palpitations, Racing/skipping heartbeats and Swelling. Gastrointestinal Present- Heartburn. Not Present- Abdominal Pain, Bloody Stool, Constipation, Diarrhea, Difficulty Swallowing, Jaundice, Loss of appetitie, Nausea and Vomiting. Female Genitourinary Not Present- Blood in Urine, Discharge, Flank Pain, Incontinence, Painful Urination, Urgency, Urinary frequency, Urinary Retention, Urinating at Night and Weak urinary stream. Musculoskeletal Present- Joint Pain and Joint Swelling. Not Present- Back Pain, Morning Stiffness, Muscle Pain, Muscle Weakness and Spasms. Neurological Not Present- Blackout spells, Difficulty with balance, Dizziness, Paralysis, Tremor and Weakness. Psychiatric Not Present- Insomnia.  Vitals  Weight: 212 lb Height: 64in Body Surface Area: 2.01 m Body Mass Index: 36.39 kg/m  Pulse: 64 (Regular)  BP: 118/74 (Sitting, Right Arm, Standard)  Physical Exam General Mental Status -Alert, cooperative and good historian. General Appearance-pleasant, Not in acute distress. Orientation-Oriented X3. Build & Nutrition-Well nourished and Well developed.  Head and Neck Head-normocephalic, atraumatic . Neck Global Assessment - supple, no bruit auscultated on the right, no bruit auscultated on the left.  Eye Vision-Wears corrective lenses. Pupil - Bilateral-Regular and Round. Motion - Bilateral-EOMI.  Chest and Lung Exam Auscultation Breath sounds - clear at anterior chest wall and clear at posterior chest wall.  Adventitious sounds - No Adventitious sounds.  Cardiovascular Auscultation Rhythm - Regular rate and rhythm. Heart Sounds - S1 WNL and S2 WNL. Murmurs & Other Heart Sounds - Auscultation of the heart reveals - No Murmurs.  Abdomen Palpation/Percussion Tenderness - Abdomen is non-tender to palpation. Rigidity (guarding) - Abdomen is soft. Auscultation Auscultation of the abdomen reveals - Bowel sounds normal.  Female Genitourinary Note: Not done, not pertinent to present illness   Musculoskeletal Note: On exam, she is alert and oriented, in no apparent distress. Right hip has normal motion. No discomfort. Right knee, no effusion. Range of motion in the right knee is about 5 to 125. She is very tender along the medial aspect with no lateral tenderness or any instability noted.  Assessment & Plan Primary osteoarthritis of right knee (M17.11)  Note:Surgical Plans: Right Unicompartmental Knee Replacement  Disposition: Home  PCP: Dr. Birdie Riddle  IV TXA  Anesthesia Issues: None  Signed electronically by Ok Edwards, III PA-C

## 2016-05-22 ENCOUNTER — Ambulatory Visit (HOSPITAL_COMMUNITY): Payer: Medicare Other | Admitting: Certified Registered Nurse Anesthetist

## 2016-05-22 ENCOUNTER — Encounter (HOSPITAL_COMMUNITY)
Admission: RE | Disposition: A | Discharge status: Home / Self Care | Payer: Self-pay | Source: Ambulatory Visit | Attending: Orthopedic Surgery

## 2016-05-22 ENCOUNTER — Encounter (HOSPITAL_COMMUNITY): Payer: Self-pay | Admitting: *Deleted

## 2016-05-22 ENCOUNTER — Observation Stay (HOSPITAL_COMMUNITY)
Admission: RE | Admit: 2016-05-22 | Discharge: 2016-05-23 | Disposition: A | Discharge status: Home / Self Care | Payer: Medicare Other | Source: Ambulatory Visit | Attending: Orthopedic Surgery | Admitting: Orthopedic Surgery

## 2016-05-22 DIAGNOSIS — Z79899 Other long term (current) drug therapy: Secondary | ICD-10-CM | POA: Insufficient documentation

## 2016-05-22 DIAGNOSIS — G4730 Sleep apnea, unspecified: Secondary | ICD-10-CM | POA: Insufficient documentation

## 2016-05-22 DIAGNOSIS — F329 Major depressive disorder, single episode, unspecified: Secondary | ICD-10-CM | POA: Diagnosis not present

## 2016-05-22 DIAGNOSIS — I10 Essential (primary) hypertension: Secondary | ICD-10-CM | POA: Insufficient documentation

## 2016-05-22 DIAGNOSIS — M179 Osteoarthritis of knee, unspecified: Secondary | ICD-10-CM | POA: Diagnosis present

## 2016-05-22 DIAGNOSIS — E039 Hypothyroidism, unspecified: Secondary | ICD-10-CM | POA: Diagnosis not present

## 2016-05-22 DIAGNOSIS — R000 Tachycardia, unspecified: Secondary | ICD-10-CM | POA: Insufficient documentation

## 2016-05-22 DIAGNOSIS — K219 Gastro-esophageal reflux disease without esophagitis: Secondary | ICD-10-CM | POA: Insufficient documentation

## 2016-05-22 DIAGNOSIS — M1710 Unilateral primary osteoarthritis, unspecified knee: Secondary | ICD-10-CM | POA: Diagnosis present

## 2016-05-22 DIAGNOSIS — M170 Bilateral primary osteoarthritis of knee: Principal | ICD-10-CM | POA: Insufficient documentation

## 2016-05-22 HISTORY — PX: PARTIAL KNEE ARTHROPLASTY: SHX2174

## 2016-05-22 LAB — TYPE AND SCREEN
ABO/RH(D): A POS
ANTIBODY SCREEN: NEGATIVE

## 2016-05-22 LAB — POTASSIUM: Potassium: 4.7 mmol/L (ref 3.5–5.1)

## 2016-05-22 SURGERY — ARTHROPLASTY, KNEE, UNICOMPARTMENTAL
Anesthesia: Spinal | Site: Knee | Laterality: Right

## 2016-05-22 MED ORDER — ONDANSETRON HCL 4 MG/2ML IJ SOLN: 4.0000 mg | Freq: Four times a day (QID) | INTRAMUSCULAR | Status: DC | PRN

## 2016-05-22 MED ORDER — DEXAMETHASONE SODIUM PHOSPHATE 10 MG/ML IJ SOLN
10.0000 mg | Freq: Once | INTRAMUSCULAR | Status: AC
  Administered 2016-05-22: 10 mg via INTRAVENOUS

## 2016-05-22 MED ORDER — SODIUM CHLORIDE 0.9 % IJ SOLN
INTRAMUSCULAR | Status: DC | PRN
  Administered 2016-05-22: 30 mL

## 2016-05-22 MED ORDER — FLEET ENEMA 7-19 GM/118ML RE ENEM: 1.0000 | ENEMA | Freq: Once | RECTAL | Status: DC | PRN

## 2016-05-22 MED ORDER — SPIRONOLACTONE 25 MG PO TABS
25.0000 mg | ORAL_TABLET | Freq: Every day | ORAL | Status: DC
  Administered 2016-05-23: 25 mg via ORAL
  Filled 2016-05-22: qty 1

## 2016-05-22 MED ORDER — PHENOL 1.4 % MT LIQD
1.0000 | OROMUCOSAL | Status: DC | PRN
Start: 2016-05-22 — End: 2016-05-23

## 2016-05-22 MED ORDER — OXYCODONE HCL 5 MG PO TABS
5.0000 mg | ORAL_TABLET | ORAL | Status: DC | PRN
  Administered 2016-05-22: 5 mg via ORAL
  Administered 2016-05-22 – 2016-05-23 (×5): 10 mg via ORAL
  Filled 2016-05-22 (×5): qty 2
  Filled 2016-05-22: qty 1

## 2016-05-22 MED ORDER — MENTHOL 3 MG MT LOZG: 1.0000 | LOZENGE | OROMUCOSAL | Status: DC | PRN

## 2016-05-22 MED ORDER — PHENYLEPHRINE HCL 10 MG/ML IJ SOLN
INTRAMUSCULAR | Status: DC | PRN
  Administered 2016-05-22: 40 ug via INTRAVENOUS
  Administered 2016-05-22 (×2): 80 ug via INTRAVENOUS
  Administered 2016-05-22: 40 ug via INTRAVENOUS
  Administered 2016-05-22: 80 ug via INTRAVENOUS

## 2016-05-22 MED ORDER — LACTATED RINGERS IV SOLN: INTRAVENOUS | Status: DC

## 2016-05-22 MED ORDER — DIPHENHYDRAMINE HCL 12.5 MG/5ML PO ELIX: 12.5000 mg | ORAL_SOLUTION | ORAL | Status: DC | PRN

## 2016-05-22 MED ORDER — BUPIVACAINE LIPOSOME 1.3 % IJ SUSP
INTRAMUSCULAR | Status: DC | PRN
  Administered 2016-05-22: 20 mL

## 2016-05-22 MED ORDER — PROPOFOL 500 MG/50ML IV EMUL
INTRAVENOUS | Status: DC | PRN
  Administered 2016-05-22: 50 ug/kg/min via INTRAVENOUS

## 2016-05-22 MED ORDER — BUPIVACAINE LIPOSOME 1.3 % IJ SUSP
20.0000 mL | Freq: Once | INTRAMUSCULAR | Status: DC
  Filled 2016-05-22 (×2): qty 20

## 2016-05-22 MED ORDER — ONDANSETRON HCL 4 MG/2ML IJ SOLN
INTRAMUSCULAR | Status: DC | PRN
  Administered 2016-05-22: 4 mg via INTRAVENOUS

## 2016-05-22 MED ORDER — LORATADINE 10 MG PO TABS: 10.0000 mg | ORAL_TABLET | Freq: Every day | ORAL | Status: DC | PRN

## 2016-05-22 MED ORDER — MEPERIDINE HCL 50 MG/ML IJ SOLN: 6.2500 mg | INTRAMUSCULAR | Status: DC | PRN

## 2016-05-22 MED ORDER — PROPOFOL 10 MG/ML IV BOLUS
INTRAVENOUS | Status: DC | PRN
  Administered 2016-05-22 (×2): 10 mg via INTRAVENOUS

## 2016-05-22 MED ORDER — BUPIVACAINE HCL (PF) 0.25 % IJ SOLN
INTRAMUSCULAR | Status: AC
  Filled 2016-05-22: qty 30

## 2016-05-22 MED ORDER — DIPHENHYDRAMINE HCL 50 MG/ML IJ SOLN
25.0000 mg | Freq: Once | INTRAMUSCULAR | Status: AC
  Administered 2016-05-22: 25 mg via INTRAVENOUS

## 2016-05-22 MED ORDER — BISACODYL 10 MG RE SUPP: 10.0000 mg | Freq: Every day | RECTAL | Status: DC | PRN

## 2016-05-22 MED ORDER — METOCLOPRAMIDE HCL 5 MG/ML IJ SOLN: 5.0000 mg | Freq: Three times a day (TID) | INTRAMUSCULAR | Status: DC | PRN

## 2016-05-22 MED ORDER — ACETAMINOPHEN 325 MG PO TABS: 650.0000 mg | ORAL_TABLET | Freq: Four times a day (QID) | ORAL | Status: DC | PRN

## 2016-05-22 MED ORDER — RIVAROXABAN 10 MG PO TABS
10.0000 mg | ORAL_TABLET | Freq: Every day | ORAL | Status: DC
  Administered 2016-05-23: 10 mg via ORAL
  Filled 2016-05-22: qty 1

## 2016-05-22 MED ORDER — 0.9 % SODIUM CHLORIDE (POUR BTL) OPTIME
TOPICAL | Status: DC | PRN
  Administered 2016-05-22: 1000 mL

## 2016-05-22 MED ORDER — ACETAMINOPHEN 10 MG/ML IV SOLN
1000.0000 mg | Freq: Once | INTRAVENOUS | Status: AC
  Administered 2016-05-22: 1000 mg via INTRAVENOUS

## 2016-05-22 MED ORDER — METOCLOPRAMIDE HCL 5 MG PO TABS: 5.0000 mg | ORAL_TABLET | Freq: Three times a day (TID) | ORAL | Status: DC | PRN

## 2016-05-22 MED ORDER — MIDAZOLAM HCL 5 MG/5ML IJ SOLN
INTRAMUSCULAR | Status: DC | PRN
  Administered 2016-05-22: 2 mg via INTRAVENOUS

## 2016-05-22 MED ORDER — FENTANYL CITRATE (PF) 100 MCG/2ML IJ SOLN: 25.0000 ug | INTRAMUSCULAR | Status: DC | PRN

## 2016-05-22 MED ORDER — SODIUM CHLORIDE 0.9 % IR SOLN
Status: DC | PRN
  Administered 2016-05-22: 1000 mL

## 2016-05-22 MED ORDER — CITALOPRAM HYDROBROMIDE 20 MG PO TABS
20.0000 mg | ORAL_TABLET | Freq: Every day | ORAL | Status: DC
  Administered 2016-05-23: 20 mg via ORAL
  Filled 2016-05-22: qty 1

## 2016-05-22 MED ORDER — DEXAMETHASONE SODIUM PHOSPHATE 10 MG/ML IJ SOLN
10.0000 mg | Freq: Once | INTRAMUSCULAR | Status: AC
  Administered 2016-05-23: 10 mg via INTRAVENOUS
  Filled 2016-05-22: qty 1

## 2016-05-22 MED ORDER — METHOCARBAMOL 500 MG PO TABS
500.0000 mg | ORAL_TABLET | Freq: Four times a day (QID) | ORAL | Status: DC | PRN
  Administered 2016-05-23: 500 mg via ORAL
  Filled 2016-05-22: qty 1

## 2016-05-22 MED ORDER — MIDAZOLAM HCL 2 MG/2ML IJ SOLN
INTRAMUSCULAR | Status: AC
  Filled 2016-05-22: qty 2

## 2016-05-22 MED ORDER — LACTATED RINGERS IV SOLN
INTRAVENOUS | Status: DC
  Administered 2016-05-22 (×3): via INTRAVENOUS

## 2016-05-22 MED ORDER — POLYETHYLENE GLYCOL 3350 17 G PO PACK: 17.0000 g | PACK | Freq: Every day | ORAL | Status: DC | PRN

## 2016-05-22 MED ORDER — SODIUM CHLORIDE 0.9 % IJ SOLN
INTRAMUSCULAR | Status: AC
  Filled 2016-05-22: qty 50

## 2016-05-22 MED ORDER — SODIUM CHLORIDE 0.9 % IV SOLN
1000.0000 mg | INTRAVENOUS | Status: AC
  Administered 2016-05-22: 1000 mg via INTRAVENOUS
  Filled 2016-05-22: qty 10

## 2016-05-22 MED ORDER — METOPROLOL TARTRATE 25 MG PO TABS
25.0000 mg | ORAL_TABLET | Freq: Every day | ORAL | Status: DC
  Administered 2016-05-22: 25 mg via ORAL
  Filled 2016-05-22: qty 1

## 2016-05-22 MED ORDER — ONDANSETRON HCL 4 MG/2ML IJ SOLN
INTRAMUSCULAR | Status: AC
  Filled 2016-05-22: qty 2

## 2016-05-22 MED ORDER — SODIUM CHLORIDE 0.9 % IV SOLN
INTRAVENOUS | Status: DC
  Administered 2016-05-23: 02:00:00 via INTRAVENOUS

## 2016-05-22 MED ORDER — DOCUSATE SODIUM 100 MG PO CAPS
100.0000 mg | ORAL_CAPSULE | Freq: Two times a day (BID) | ORAL | Status: DC
  Administered 2016-05-22 – 2016-05-23 (×2): 100 mg via ORAL
  Filled 2016-05-22 (×2): qty 1

## 2016-05-22 MED ORDER — PHENYLEPHRINE 40 MCG/ML (10ML) SYRINGE FOR IV PUSH (FOR BLOOD PRESSURE SUPPORT)
PREFILLED_SYRINGE | INTRAVENOUS | Status: AC
  Filled 2016-05-22: qty 10

## 2016-05-22 MED ORDER — VANCOMYCIN HCL IN DEXTROSE 1-5 GM/200ML-% IV SOLN
INTRAVENOUS | Status: AC
  Filled 2016-05-22: qty 200

## 2016-05-22 MED ORDER — LEVOTHYROXINE SODIUM 112 MCG PO TABS
112.0000 ug | ORAL_TABLET | Freq: Every day | ORAL | Status: DC
  Administered 2016-05-23: 112 ug via ORAL
  Filled 2016-05-22: qty 1

## 2016-05-22 MED ORDER — PROPOFOL 10 MG/ML IV BOLUS
INTRAVENOUS | Status: AC
  Filled 2016-05-22: qty 60

## 2016-05-22 MED ORDER — ACETAMINOPHEN 500 MG PO TABS
1000.0000 mg | ORAL_TABLET | Freq: Four times a day (QID) | ORAL | Status: AC
  Administered 2016-05-22 – 2016-05-23 (×4): 1000 mg via ORAL
  Filled 2016-05-22 (×4): qty 2

## 2016-05-22 MED ORDER — FENTANYL CITRATE (PF) 100 MCG/2ML IJ SOLN
INTRAMUSCULAR | Status: AC
  Filled 2016-05-22: qty 2

## 2016-05-22 MED ORDER — CHLORHEXIDINE GLUCONATE 4 % EX LIQD: 60.0000 mL | Freq: Once | CUTANEOUS | Status: DC

## 2016-05-22 MED ORDER — METOCLOPRAMIDE HCL 5 MG/ML IJ SOLN: 10.0000 mg | Freq: Once | INTRAMUSCULAR | Status: DC | PRN

## 2016-05-22 MED ORDER — FENTANYL CITRATE (PF) 100 MCG/2ML IJ SOLN
INTRAMUSCULAR | Status: DC | PRN
Start: 2016-05-22 — End: 2016-05-22
  Administered 2016-05-22 (×2): 50 ug via INTRAVENOUS

## 2016-05-22 MED ORDER — ACETAMINOPHEN 10 MG/ML IV SOLN
INTRAVENOUS | Status: AC
  Filled 2016-05-22: qty 100

## 2016-05-22 MED ORDER — VANCOMYCIN HCL IN DEXTROSE 1-5 GM/200ML-% IV SOLN
1000.0000 mg | INTRAVENOUS | Status: AC
  Administered 2016-05-22: 1000 mg via INTRAVENOUS
  Filled 2016-05-22: qty 200

## 2016-05-22 MED ORDER — STERILE WATER FOR IRRIGATION IR SOLN
Status: DC | PRN
  Administered 2016-05-22: 3000 mL

## 2016-05-22 MED ORDER — ACETAMINOPHEN 650 MG RE SUPP: 650.0000 mg | Freq: Four times a day (QID) | RECTAL | Status: DC | PRN

## 2016-05-22 MED ORDER — METHOCARBAMOL 1000 MG/10ML IJ SOLN
500.0000 mg | Freq: Four times a day (QID) | INTRAMUSCULAR | Status: DC | PRN
  Administered 2016-05-22: 500 mg via INTRAVENOUS
  Filled 2016-05-22: qty 550
  Filled 2016-05-22: qty 5

## 2016-05-22 MED ORDER — MORPHINE SULFATE (PF) 2 MG/ML IV SOLN
1.0000 mg | INTRAVENOUS | Status: DC | PRN
  Administered 2016-05-23: 1 mg via INTRAVENOUS
  Filled 2016-05-22: qty 1

## 2016-05-22 MED ORDER — BUPIVACAINE HCL (PF) 0.25 % IJ SOLN
INTRAMUSCULAR | Status: DC | PRN
  Administered 2016-05-22: 30 mL

## 2016-05-22 MED ORDER — VANCOMYCIN HCL IN DEXTROSE 1-5 GM/200ML-% IV SOLN
1000.0000 mg | Freq: Two times a day (BID) | INTRAVENOUS | Status: AC
  Administered 2016-05-23: 1000 mg via INTRAVENOUS
  Filled 2016-05-22: qty 200

## 2016-05-22 MED ORDER — DIPHENHYDRAMINE HCL 50 MG/ML IJ SOLN
INTRAMUSCULAR | Status: AC
  Filled 2016-05-22: qty 1

## 2016-05-22 MED ORDER — ONDANSETRON HCL 4 MG PO TABS: 4.0000 mg | ORAL_TABLET | Freq: Four times a day (QID) | ORAL | Status: DC | PRN

## 2016-05-22 SURGICAL SUPPLY — 46 items
BAG DECANTER FOR FLEXI CONT (MISCELLANEOUS) ×3 IMPLANT
BAG ZIPLOCK 12X15 (MISCELLANEOUS) IMPLANT
BANDAGE ACE 6X5 VEL STRL LF (GAUZE/BANDAGES/DRESSINGS) ×3 IMPLANT
BLADE SAW RECIPROCATING 77.5 (BLADE) ×3 IMPLANT
BLADE SAW SGTL 13.0X1.19X90.0M (BLADE) ×3 IMPLANT
BOWL SMART MIX CTS (DISPOSABLE) ×3 IMPLANT
BUR OVAL CARBIDE 4.0 (BURR) ×3 IMPLANT
CAPT KNEE PARTIAL 2 ×2 IMPLANT
CEMENT HV SMART SET (Cement) ×3 IMPLANT
CLOSURE WOUND 1/2 X4 (GAUZE/BANDAGES/DRESSINGS) ×1
CLOTH BEACON ORANGE TIMEOUT ST (SAFETY) ×3 IMPLANT
CUFF TOURN SGL QUICK 34 (TOURNIQUET CUFF) ×2
CUFF TRNQT CYL 34X4X40X1 (TOURNIQUET CUFF) ×1 IMPLANT
DRSG ADAPTIC 3X8 NADH LF (GAUZE/BANDAGES/DRESSINGS) ×3 IMPLANT
DRSG PAD ABDOMINAL 8X10 ST (GAUZE/BANDAGES/DRESSINGS) ×3 IMPLANT
DURAPREP 26ML APPLICATOR (WOUND CARE) ×3 IMPLANT
ELECT REM PT RETURN 9FT ADLT (ELECTROSURGICAL) ×3
ELECTRODE REM PT RTRN 9FT ADLT (ELECTROSURGICAL) ×1 IMPLANT
EVACUATOR 1/8 PVC DRAIN (DRAIN) ×3 IMPLANT
GAUZE SPONGE 4X4 12PLY STRL (GAUZE/BANDAGES/DRESSINGS) ×3 IMPLANT
GLOVE BIO SURGEON STRL SZ7.5 (GLOVE) ×3 IMPLANT
GLOVE BIO SURGEON STRL SZ8 (GLOVE) ×3 IMPLANT
GLOVE BIOGEL PI IND STRL 7.0 (GLOVE) ×5 IMPLANT
GLOVE BIOGEL PI IND STRL 8 (GLOVE) ×2 IMPLANT
GLOVE BIOGEL PI INDICATOR 7.0 (GLOVE) ×10
GLOVE BIOGEL PI INDICATOR 8 (GLOVE) ×4
GOWN STRL REUS W/TWL LRG LVL3 (GOWN DISPOSABLE) ×6 IMPLANT
GOWN STRL REUS W/TWL XL LVL3 (GOWN DISPOSABLE) ×6 IMPLANT
HANDPIECE INTERPULSE COAX TIP (DISPOSABLE) ×2
IMMOBILIZER KNEE 20 (SOFTGOODS) ×3
IMMOBILIZER KNEE 20 THIGH 36 (SOFTGOODS) ×1 IMPLANT
KIT IMPL STRL TIB IPOLY IUNI ×1 IMPLANT
MANIFOLD NEPTUNE II (INSTRUMENTS) ×3 IMPLANT
PACK TOTAL KNEE CUSTOM (KITS) ×3 IMPLANT
PADDING CAST COTTON 6X4 STRL (CAST SUPPLIES) ×3 IMPLANT
POSITIONER SURGICAL ARM (MISCELLANEOUS) ×3 IMPLANT
SET HNDPC FAN SPRY TIP SCT (DISPOSABLE) ×1 IMPLANT
STRIP CLOSURE SKIN 1/2X4 (GAUZE/BANDAGES/DRESSINGS) ×2 IMPLANT
SUT MNCRL AB 4-0 PS2 18 (SUTURE) ×3 IMPLANT
SUT VIC AB 2-0 CT1 27 (SUTURE) ×4
SUT VIC AB 2-0 CT1 TAPERPNT 27 (SUTURE) ×2 IMPLANT
SUT VLOC 180 0 24IN GS25 (SUTURE) ×3 IMPLANT
SYR 50ML LL SCALE MARK (SYRINGE) ×3 IMPLANT
TRAY FOLEY W/METER SILVER 14FR (SET/KITS/TRAYS/PACK) IMPLANT
TRAY FOLEY W/METER SILVER 16FR (SET/KITS/TRAYS/PACK) IMPLANT
WRAP KNEE MAXI GEL POST OP (GAUZE/BANDAGES/DRESSINGS) ×3 IMPLANT

## 2016-05-22 NOTE — Anesthesia Postprocedure Evaluation (Signed)
Anesthesia Post Note  Patient: Kristine Olson  Procedure(s) Performed: Procedure(s) (LRB): RIGHT KNEE MEDIAL UNICOMPARTMENTAL ARTHROPLASTY (Right)  Patient location during evaluation: PACU Anesthesia Type: Spinal Level of consciousness: awake and alert Pain management: pain level controlled Vital Signs Assessment: post-procedure vital signs reviewed and stable Respiratory status: spontaneous breathing and respiratory function stable Cardiovascular status: blood pressure returned to baseline and stable Postop Assessment: no headache, no backache and spinal receding Anesthetic complications: no    Last Vitals:  Vitals:   05/22/16 1700 05/22/16 1715  BP: 121/71 119/74  Pulse: 75 68  Resp: 11 12  Temp:      Last Pain: There were no vitals filed for this visit.               Montez Hageman

## 2016-05-22 NOTE — Anesthesia Preprocedure Evaluation (Signed)
Anesthesia Evaluation  Patient identified by MRN, date of birth, ID band Patient awake    Reviewed: Allergy & Precautions, NPO status , Patient's Chart, lab work & pertinent test results  Airway Mallampati: II  TM Distance: >3 FB Neck ROM: Full    Dental no notable dental hx.    Pulmonary sleep apnea and Continuous Positive Airway Pressure Ventilation ,    Pulmonary exam normal breath sounds clear to auscultation       Cardiovascular hypertension, Pt. on medications and Pt. on home beta blockers Normal cardiovascular exam Rhythm:Regular Rate:Normal     Neuro/Psych negative neurological ROS  negative psych ROS   GI/Hepatic negative GI ROS, Neg liver ROS,   Endo/Other  negative endocrine ROS  Renal/GU negative Renal ROS  negative genitourinary   Musculoskeletal negative musculoskeletal ROS (+)   Abdominal   Peds negative pediatric ROS (+)  Hematology negative hematology ROS (+)   Anesthesia Other Findings   Reproductive/Obstetrics negative OB ROS                            Anesthesia Physical Anesthesia Plan  ASA: II  Anesthesia Plan: Spinal   Post-op Pain Management:    Induction:   Airway Management Planned: Simple Face Mask  Additional Equipment:   Intra-op Plan:   Post-operative Plan:   Informed Consent: I have reviewed the patients History and Physical, chart, labs and discussed the procedure including the risks, benefits and alternatives for the proposed anesthesia with the patient or authorized representative who has indicated his/her understanding and acceptance.   Dental advisory given  Plan Discussed with: CRNA  Anesthesia Plan Comments:         Anesthesia Quick Evaluation

## 2016-05-22 NOTE — H&P (View-Only) (Signed)
Kristine Olson DOB: [birth year] Married / Language: English / Race: White Female Date of Admission:  05/22/2016 CC:  Right Knee Pain History of Present Illness The patient is a 70 year old female who comes in for a preoperative History and Physical. The patient is scheduled for a right unicompartmental knee replacement to be performed by Dr. Dione Plover. Aluisio, MD at [facility] on 05/22/2016. The patient is a 70 year old female who presented for follow up of their knee. The patient is being followed for their bilateral knee pain and osteoarthritis. They are now months out from Euflexxa series. Symptoms reported include: pain, aching and difficulty ambulating. The patient feels that they are doing poorly and report their pain level to be moderate to severe (worse with prolonged activity). Current treatment includes: activity modification. The following medication has been used for pain control: antiinflammatory medication (ibuprofen, prn). The patient has not gotten any relief of their symptoms with viscosupplementation. Right knee is what is giving her the most trouble. The viscosupplement injections did not help on the right side at all. She is satisfied with how she is doing on the left. She states that the right knee is bothering her with most activities. It is limiting what she can and cannot do. She would like to be more active but cannot be so because of the right knee. She is ready to proceed with surgery. They have been treated conservatively in the past for the above stated problem and despite conservative measures, they continue to have progressive pain and severe functional limitations and dysfunction. They have failed non-operative management including home exercise, medications, and injections. It is felt that they would benefit from undergoing unicompartmental joint replacement. Risks and benefits of the procedure have been discussed with the patient and they elect to proceed with  surgery. There are no active contraindications to surgery such as ongoing infection or rapidly progressive neurological disease.  Problem List/Past Medical Primary osteoarthritis of both knees (M17.0)  Depression  Gastroesophageal Reflux Disease  High blood pressure  Hypothyroidism  Irritable bowel syndrome  Hemorrhoids  occasional bleeding Sleep Apnea  uses CPAP Tinnitus  Varicose veins  Menopause  Osteopenia  Allergies Penicillamine *Assorted Classes  Rash. Ceclor *CEPHALOSPORINS*  Rash. Sulfamethoxazole *Sulfonamides**  Rash. Flonase *NASAL AGENTS - SYSTEMIC AND TOPICAL*  Did not tolerate  Family History  Cancer  Brother, Maternal Grandmother, Paternal Grandmother. Drug / Alcohol Addiction  Father. Heart Disease  Maternal Grandfather. Hypertension  Father, Mother. Liver Disease, Chronic  Father. Osteoarthritis  Maternal Grandmother. Osteoporosis  Mother. Rheumatoid Arthritis  Maternal Grandmother, Mother.  Social History Children  5 or more Current drinker  06/21/2015: Currently drinks wine only occasionally per week Current work status  retired Exercise  Exercises rarely; does running / walking Living situation  live with spouse Marital status  married No history of drug/alcohol rehab  Not under pain contract  Number of flights of stairs before winded  2-3 Tobacco use  Never smoker. 06/21/2015 Advance Directives  Living Will, Healthcare POA  Medication History Levothyroxine Sodium (112MCG Tablet, Oral) Active. Metoprolol Tartrate (25MG  Tablet, Oral) Active. Spironolactone (25MG  Tablet, Oral) Active. CeleXA (10MG  Tablet, Oral) Active. Vitamin D (1000UNIT Tablet, Oral) Active. Ibuprofen (200MG  Tablet, Oral) Active.   Past Surgical History Breast Biopsy  bilateral Cesarean Delivery  Date: 1982. 1 time Colon Polyp Removal - Colonoscopy  Tonsillectomy  Date: 1955.   Review of Systems General Not Present- Chills,  Fatigue, Fever, Memory Loss, Night Sweats, Weight Gain and  Weight Loss. Skin Not Present- Eczema, Hives, Itching, Lesions and Rash. HEENT Not Present- Dentures, Double Vision, Headache, Hearing Loss, Tinnitus and Visual Loss. Respiratory Not Present- Allergies, Chronic Cough, Coughing up blood, Shortness of breath at rest and Shortness of breath with exertion. Cardiovascular Not Present- Chest Pain, Difficulty Breathing Lying Down, Murmur, Palpitations, Racing/skipping heartbeats and Swelling. Gastrointestinal Present- Heartburn. Not Present- Abdominal Pain, Bloody Stool, Constipation, Diarrhea, Difficulty Swallowing, Jaundice, Loss of appetitie, Nausea and Vomiting. Female Genitourinary Not Present- Blood in Urine, Discharge, Flank Pain, Incontinence, Painful Urination, Urgency, Urinary frequency, Urinary Retention, Urinating at Night and Weak urinary stream. Musculoskeletal Present- Joint Pain and Joint Swelling. Not Present- Back Pain, Morning Stiffness, Muscle Pain, Muscle Weakness and Spasms. Neurological Not Present- Blackout spells, Difficulty with balance, Dizziness, Paralysis, Tremor and Weakness. Psychiatric Not Present- Insomnia.  Vitals  Weight: 212 lb Height: 64in Body Surface Area: 2.01 m Body Mass Index: 36.39 kg/m  Pulse: 64 (Regular)  BP: 118/74 (Sitting, Right Arm, Standard)  Physical Exam General Mental Status -Alert, cooperative and good historian. General Appearance-pleasant, Not in acute distress. Orientation-Oriented X3. Build & Nutrition-Well nourished and Well developed.  Head and Neck Head-normocephalic, atraumatic . Neck Global Assessment - supple, no bruit auscultated on the right, no bruit auscultated on the left.  Eye Vision-Wears corrective lenses. Pupil - Bilateral-Regular and Round. Motion - Bilateral-EOMI.  Chest and Lung Exam Auscultation Breath sounds - clear at anterior chest wall and clear at posterior chest wall.  Adventitious sounds - No Adventitious sounds.  Cardiovascular Auscultation Rhythm - Regular rate and rhythm. Heart Sounds - S1 WNL and S2 WNL. Murmurs & Other Heart Sounds - Auscultation of the heart reveals - No Murmurs.  Abdomen Palpation/Percussion Tenderness - Abdomen is non-tender to palpation. Rigidity (guarding) - Abdomen is soft. Auscultation Auscultation of the abdomen reveals - Bowel sounds normal.  Female Genitourinary Note: Not done, not pertinent to present illness   Musculoskeletal Note: On exam, she is alert and oriented, in no apparent distress. Right hip has normal motion. No discomfort. Right knee, no effusion. Range of motion in the right knee is about 5 to 125. She is very tender along the medial aspect with no lateral tenderness or any instability noted.  Assessment & Plan Primary osteoarthritis of right knee (M17.11)  Note:Surgical Plans: Right Unicompartmental Knee Replacement  Disposition: Home  PCP: Dr. Birdie Riddle  IV TXA  Anesthesia Issues: None  Signed electronically by Ok Edwards, III PA-C

## 2016-05-22 NOTE — Interval H&P Note (Signed)
History and Physical Interval Note:  05/22/2016 2:41 PM  Kristine Olson  has presented today for surgery, with the diagnosis of MEDIAL COMPARTMENTAL OA RIGHT KNEE   The various methods of treatment have been discussed with the patient and family. After consideration of risks, benefits and other options for treatment, the patient has consented to  Procedure(s): RIGHT KNEE MEDIAL UNICOMPARTMENTAL ARTHROPLASTY (Right) as a surgical intervention .  The patient's history has been reviewed, patient examined, no change in status, stable for surgery.  I have reviewed the patient's chart and labs.  Questions were answered to the patient's satisfaction.     Gearlean Alf

## 2016-05-22 NOTE — Anesthesia Procedure Notes (Signed)
Spinal  Patient location during procedure: OR Start time: 05/22/2016 3:11 PM End time: 05/22/2016 3:15 PM Staffing Anesthesiologist: Montez Hageman Resident/CRNA: Darlys Gales R Performed: resident/CRNA  Preanesthetic Checklist Completed: patient identified, site marked, surgical consent, pre-op evaluation, timeout performed, IV checked, risks and benefits discussed and monitors and equipment checked Spinal Block Patient position: sitting Prep: Betadine Patient monitoring: heart rate, continuous pulse ox and blood pressure Approach: midline Location: L3-4 Injection technique: single-shot Needle Needle type: Spinocan  Needle gauge: 22 G Needle length: 9 cm Needle insertion depth: 7 cm Assessment Sensory level: T6 Additional Notes Expiration date of kit checked and confirmed. Patient tolerated procedure well, without complications.

## 2016-05-22 NOTE — Op Note (Signed)
OPERATIVE REPORT  PREOPERATIVE DIAGNOSIS: Medial compartment osteoarthritis, Right knee  POSTOPERATIVE DIAGNOSIS: Medial compartment osteoarthritis, Right knee  PROCEDURE:Right knee medial unicompartmental arthroplasty.   SURGEON: Gaynelle Arabian, MD   ASSISTANT: Arlee Muslim, PA-C  ANESTHESIA:  Spinal.   ESTIMATED BLOOD LOSS: Minimal.   DRAINS: Hemovac x1.   TOURNIQUET TIME:   Total Tourniquet Time Documented: Thigh (Right) - 26 minutes Total: Thigh (Right) - 26 minutes    COMPLICATIONS: None.   CONDITION: Stable to recovery.   BRIEF CLINICAL NOTE:Kristine Olson is a 70 y.o. female, who has  significant isolated medial compartment arthritis of the Right knee. The patient has had nonoperative management including injections of cortisone and viscous supplements. Unfortunately, the pain persists.  Radiograph showed isolated medial compartment bone-on-bone arthritis  with normal-appearing patellofemoral and lateral compartments. The patient presents now for left knee unicompartmental arthroplasty.   PROCEDURE IN DETAIL: After successful administration of  Spinal anesthetic, a tourniquet was placed high on the  Right thigh and the Right lower extremity prepped and draped in usual sterile fashion. Extremity was wrapped in an Esmarch, knee flexed, and tourniquet inflated to 300 mmHg.       A midline incision was made with a 10 blade through subcutaneous  tissue to the extensor mechanism. A fresh blade was used to make a  medial parapatellar arthrotomy. Soft tissue on the proximal medial  tibia subperiosteally elevated to the joint line with a knife and into  the semimembranosus bursa with a Cobb elevator. The patella was  subluxed laterally, and the knee flexed 90 degrees. The ACL was intact.  The marginal osteophytes on the medial femur and tibia were removed with  a rongeur. The medial meniscus was also removed. The femoral cutting  block for the conformis  unicompartmental knee system was placed along  the femur. There was excellent fit. I traced the outline. We then  removed any remaining cartilage within this outline. We then placed the  cutting block again and pinned in position. The posterior femoral cut  was made, it was approximately 5 mm. The lug holes for the femoral  component were then drilled through the cutting block. The cutting  block was subsequently removed. We then utilized the high speed burr to  create a small trough at the superior aspect of the component tomake it inset and would not overhang the cartilage. The trial was placed,  it had excellent fit. The trial was subsequently removed.       The trial was placed again and the B chip was placed. There was  excellent balance throughout full motion. Also with excellent fit on  the tibia. This was removed as was the femoral trial. A curette was  used to remove any remaining cartilage from the tibia. The tibial  cutting block was then placed and there was a perfect fit on the tibial  surface. The appropriate slope was placed and it was pinned in  position. The reciprocating saw was used to make the central cut and  then the oscillating saw used to make the horizontal cut. The bone  fragment was then removed. The tibial trial was placed and had perfect  fit on the tibia. We then drilled the 2 lug holes and did the keel punch.  We then placed tibia trial and femur trial, and a 6 mm trial insert. There was  excellent stability throughout full range of motion and no impingement.  The trial was then removed. We drilled small holes in the  distal  femur in order to create more conduits for the cement. The cut bone  surfaces were thoroughly irrigated with pulsatile lavage while the  cement was mixed on the back table. We then cemented the tibial  component into place, impacted it and removed the extruded cement. The  same was done for the femoral component. Trial 6-mm inserts  placed,  knee held in full extension, and all extruded cement removed. While the  cement was hardening, I injected the extensor mechanism, periosteum of  the femur and subcu tissues, a total of 20 mL of Exparel mixed with 30  mL of saline and then did an additional injection of 20 mL of 0.25%  Marcaine into the same tissues. When the cement had fully hardened,  then the permanent polyethylene was placed in tibial tray. There was  excellent stability throughout full range of motion with no lift off the  component and no evidence of any impingement.       Wound was copiously irrigated with saline solution, and the arthrotomy closed over a Hemovac drain with a running #1 V-Loc suture. The subcutaneous was closed with  interrupted 2-0 Vicryl and subcuticular running 4-0 Monocryl. The drain  was hooked to suction. Incision cleaned and dried and Steri-Strips and  a bulky sterile dressing applied. The tourniquet was released after a  total time of 26 minutes. This was done after closing the extensor  mechanism. The wound was closed and a bulky sterile dressing was  applied. The operative limb was placed into a knee immobilizer, and the patient awakened and transported to recovery room in stable condition.       Please note that a surgical assistant was a medical necessity for this  procedure in order to perform it in a safe and expeditious manner.  Assistance was necessary for retracting vital ligaments, neurovascular  structures, as well as for proper positioning of the limb to allow for  appropriate bone cuts and appropriate placement of the prosthesis.    Kristine Plover Marcellino Fidalgo, MD

## 2016-05-22 NOTE — Transfer of Care (Signed)
Immediate Anesthesia Transfer of Care Note  Patient: Kristine Olson  Procedure(s) Performed: Procedure(s): RIGHT KNEE MEDIAL UNICOMPARTMENTAL ARTHROPLASTY (Right)  Patient Location: PACU  Anesthesia Type:Spinal  Level of Consciousness:  sedated, patient cooperative and responds to stimulation  Airway & Oxygen Therapy:Patient Spontanous Breathing and Patient connected to face mask oxgen  Post-op Assessment:  Report given to PACU RN and Post -op Vital signs reviewed and stable  Post vital signs:  Reviewed and stable, spinal L1  Last Vitals: There were no vitals filed for this visit.  Complications: No apparent anesthesia complications

## 2016-05-23 ENCOUNTER — Encounter (HOSPITAL_COMMUNITY): Payer: Self-pay | Admitting: Orthopedic Surgery

## 2016-05-23 DIAGNOSIS — M170 Bilateral primary osteoarthritis of knee: Secondary | ICD-10-CM | POA: Diagnosis not present

## 2016-05-23 LAB — BASIC METABOLIC PANEL
Anion gap: 7 (ref 5–15)
BUN: 18 mg/dL (ref 6–20)
CALCIUM: 8.8 mg/dL — AB (ref 8.9–10.3)
CO2: 26 mmol/L (ref 22–32)
CREATININE: 1.01 mg/dL — AB (ref 0.44–1.00)
Chloride: 103 mmol/L (ref 101–111)
GFR calc Af Amer: >60 mL/min (ref >60)
GFR, EST NON AFRICAN AMERICAN: 56 mL/min — AB (ref >60)
Glucose, Bld: 151 mg/dL — ABNORMAL HIGH (ref 65–99)
POTASSIUM: 4.4 mmol/L (ref 3.5–5.1)
SODIUM: 136 mmol/L (ref 135–145)

## 2016-05-23 LAB — CBC
HCT: 37.1 % (ref 36.0–46.0)
Hemoglobin: 12 g/dL (ref 12.0–15.0)
MCH: 28.8 pg (ref 26.0–34.0)
MCHC: 32.3 g/dL (ref 30.0–36.0)
MCV: 89.2 fL (ref 78.0–100.0)
PLATELETS: 218 10*3/uL (ref 150–400)
RBC: 4.16 MIL/uL (ref 3.87–5.11)
RDW: 13.7 % (ref 11.5–15.5)
WBC: 10.6 10*3/uL — ABNORMAL HIGH (ref 4.0–10.5)

## 2016-05-23 MED ORDER — METHOCARBAMOL 500 MG PO TABS: 500.0000 mg | ORAL_TABLET | Freq: Four times a day (QID) | ORAL | 0 refills | Status: DC | PRN

## 2016-05-23 MED ORDER — RIVAROXABAN 10 MG PO TABS: 10.0000 mg | ORAL_TABLET | Freq: Every day | ORAL | 0 refills | Status: DC

## 2016-05-23 MED ORDER — OXYCODONE HCL 5 MG PO TABS: 5.0000 mg | ORAL_TABLET | ORAL | 0 refills | Status: DC | PRN

## 2016-05-23 NOTE — Care Management Obs Status (Signed)
[facility] NOTIFICATION   Patient Details  Name: LOUVENA FESS MRN: [id number] Date of Birth: 08/06/1946   Medicare Observation Status Notification Given:  Yes    Dellie Catholic, RN 05/23/2016, 12:37 PM

## 2016-05-23 NOTE — Progress Notes (Signed)
Pt to d/c home with [facility]. DME delivered to room prior to d/c. AVS reviewed and "My Chart" discussed with pt. Pt capable of verbalizing medications, dressing changes, signs and symptoms of infection, and follow-up appointments. Patient medicated for pain prior to d/c. Remains hemodynamically stable. No signs and symptoms of distress. Educated pt to return to ER in the case of SOB, dizziness, or chest pain.

## 2016-05-23 NOTE — Progress Notes (Signed)
   05/23/16 1300  PT Visit Information  Last PT Received On 05/23/16  Assistance Needed +1  History of Present Illness s/p R uni knee  Subjective Data  Subjective I'm sleepy  Patient Stated Goal less knee pain  Precautions  Precautions Knee  Restrictions  Weight Bearing Restrictions No  Other Position/Activity Restrictions WBAT  Pain Assessment  Pain Assessment 0-10  Pain Score 3  Pain Location R knee  Pain Descriptors / Indicators Operative site guarding;Sore  Pain Intervention(s) Limited activity within patient's tolerance;Monitored during session;Premedicated before session  Cognition  Arousal/Alertness Awake/alert  Behavior During Therapy WFL for tasks assessed/performed  Overall Cognitive Status Within Functional Limits for tasks assessed  Bed Mobility  General bed mobility comments oob  Transfers  Equipment used Rolling walker (2 wheeled)  Transfers Sit to/from Stand  Sit to Stand Supervision  General transfer comment for safety; cues for UE/LE placement  Total Joint Exercises  Ankle Circles/Pumps AROM;Both;10 reps  [employer] 10 reps;Both;AROM  Short Arc Quad AAROM;AROM;Strengthening;Right;10 reps  Heel Slides AROM;AAROM;Right;10 reps  Hip ABduction/ADduction AROM;AAROM;Right;10 reps  Straight Leg Raises AAROM;AROM;Strengthening;Right;10 reps  Goniometric ROM ~5 to ~55*  PT - End of Session  Activity Tolerance Patient tolerated treatment well  Patient left with call bell/phone within reach;in chair;with family/visitor present  Nurse Communication Mobility status  PT - Assessment/Plan  PT Plan Current plan remains appropriate  PT Frequency (ACUTE ONLY) 7X/week  Follow Up Recommendations Home health PT  PT equipment Rolling walker with 5" wheels  PT Goal Progression  Progress towards PT goals Progressing toward goals  Acute Rehab PT Goals  PT Goal Formulation With patient  Time For Goal Achievement 05/26/16  Potential to Achieve Goals Good  PT Time Calculation   PT Start Time (ACUTE ONLY) 1333  PT Stop Time (ACUTE ONLY) 1352  PT Time Calculation (min) (ACUTE ONLY) 19 min  PT G-Codes **NOT FOR INPATIENT CLASS**  Functional Assessment Tool Used clinical judgement  Functional Limitation Mobility: Walking and moving around  Mobility: Walking and Moving Around Goal Status [phone]) CI  Mobility: Walking and Moving Around Discharge Status [id number]) CI  PT General Charges  $$ ACUTE PT VISIT 1 Procedure  PT Treatments  $Therapeutic Exercise 8-22 mins

## 2016-05-23 NOTE — Discharge Instructions (Addendum)
° °Dr. Frank Aluisio °Total Joint Specialist °[facility] °[address] °[place], [place] [address] °[phone] ° °UNI KNEE REPLACEMENT POSTOPERATIVE DIRECTIONS ° ° °Knee Rehabilitation, Guidelines Following Surgery  °Results after knee surgery are often greatly improved when you follow the exercise, range of motion and muscle strengthening exercises prescribed by your doctor. Safety measures are also important to protect the knee from further injury. Any time any of these exercises cause you to have increased pain or swelling in your knee joint, decrease the amount until you are comfortable again and slowly increase them. If you have problems or questions, call your caregiver or physical therapist for advice.  ° °HOME CARE INSTRUCTIONS  °Remove items at home which could result in a fall. This includes throw rugs or furniture in walking pathways.  °· ICE to the affected knee every three hours for 30 minutes at a time and then as needed for pain and swelling.  Continue to use ice on the knee for pain and swelling from surgery. You may notice swelling that will progress down to the foot and ankle.  This is normal after surgery.  Elevate the leg when you are not up walking on it.   °· Continue to use the breathing machine which will help keep your temperature down.  It is common for your temperature to cycle up and down following surgery, especially at night when you are not up moving around and exerting yourself.  The breathing machine keeps your lungs expanded and your temperature down. °· Do not place pillow under knee, focus on keeping the knee straight while resting ° °DIET °You may resume your previous home diet once your are discharged from the hospital. ° °DRESSING / WOUND CARE / SHOWERING °You may shower 3 days after surgery, but keep the wounds dry during showering.  You may use an occlusive plastic wrap (Press'n Seal for example), NO SOAKING/SUBMERGING IN THE BATHTUB.  If the  bandage gets wet, change with a clean dry gauze.  If the incision gets wet, pat the wound dry with a clean towel. °You may start showering once you are discharged home but do not submerge the incision under water. Just pat the incision dry and apply a dry gauze dressing on daily. °Change the surgical dressing daily and reapply a dry dressing each time. ° °ACTIVITY °Walk with your walker as instructed. °Use walker as long as suggested by your caregivers. °Avoid periods of inactivity such as sitting longer than an hour when not asleep. This helps prevent blood clots.  °You may resume a sexual relationship in one month or when given the OK by your doctor.  °You may return to work once you are cleared by your doctor.  °Do not drive a car for 6 weeks or until released by you surgeon.  °Do not drive while taking narcotics. ° °WEIGHT BEARING °Weight bearing as tolerated with assist device (walker, cane, etc) as directed, use it as long as suggested by your surgeon or therapist, typically at least 4-6 weeks. ° °POSTOPERATIVE CONSTIPATION PROTOCOL °Constipation - defined medically as fewer than three stools per week and severe constipation as less than one stool per week. ° °One of the most common issues patients have following surgery is constipation.  Even if you have a regular bowel pattern at home, your normal regimen is likely to be disrupted due to multiple reasons following surgery.  Combination of anesthesia, postoperative narcotics, change in appetite and fluid intake all can affect your bowels.    In order to avoid complications following surgery, here are some recommendations in order to help you during your recovery period.  Colace (docusate) - Pick up an over-the-counter form of Colace or another stool softener and take twice a day as long as you are requiring postoperative pain medications.  Take with a full glass of water daily.  If you experience loose stools or diarrhea, hold the colace until you stool forms  back up.  If your symptoms do not get better within 1 week or if they get worse, check with your doctor.  Dulcolax (bisacodyl) - Pick up over-the-counter and take as directed by the product packaging as needed to assist with the movement of your bowels.  Take with a full glass of water.  Use this product as needed if not relieved by Colace only.   MiraLax (polyethylene glycol) - Pick up over-the-counter to have on hand.  MiraLax is a solution that will increase the amount of water in your bowels to assist with bowel movements.  Take as directed and can mix with a glass of water, juice, soda, coffee, or tea.  Take if you go more than two days without a movement. Do not use MiraLax more than once per day. Call your doctor if you are still constipated or irregular after using this medication for 7 days in a row.  If you continue to have problems with postoperative constipation, please contact the office for further assistance and recommendations.  If you experience "the worst abdominal pain ever" or develop nausea or vomiting, please contact the office immediatly for further recommendations for treatment.  ITCHING  If you experience itching with your medications, try taking only a single pain pill, or even half a pain pill at a time.  You can also use Benadryl over the counter for itching or also to help with sleep.   TED HOSE STOCKINGS Wear the elastic stockings on both legs for three weeks following surgery during the day but you may remove then at night for sleeping.  MEDICATIONS See your medication summary on the After Visit Summary that the nursing staff will review with you prior to discharge.  You may have some home medications which will be placed on hold until you complete the course of blood thinner medication.  It is important for you to complete the blood thinner medication as prescribed by your surgeon.  Continue your approved medications as instructed at time of  discharge.  PRECAUTIONS If you experience chest pain or shortness of breath - call 911 immediately for transfer to the hospital emergency department.  If you develop a fever greater that 101 F, purulent drainage from wound, increased redness or drainage from wound, foul odor from the wound/dressing, or calf pain - CONTACT YOUR SURGEON.                                                   FOLLOW-UP APPOINTMENTS Make sure you keep all of your appointments after your operation with your surgeon and caregivers. You should call the office at the above phone number and make an appointment for approximately two weeks after the date of your surgery or on the date instructed by your surgeon outlined in the "After Visit Summary".  RANGE OF MOTION AND STRENGTHENING EXERCISES  Rehabilitation of the knee is important following a knee injury or an  operation. After just a few days of immobilization, the muscles of the thigh which control the knee become weakened and shrink (atrophy). Knee exercises are designed to build up the tone and strength of the thigh muscles and to improve knee motion. Often times heat used for twenty to thirty minutes before working out will loosen up your tissues and help with improving the range of motion but do not use heat for the first two weeks following surgery. These exercises can be done on a training (exercise) mat, on the floor, on a table or on a bed. Use what ever works the best and is most comfortable for you Knee exercises include:  °Leg Lifts - While your knee is still immobilized in a splint or cast, you can do straight leg raises. Lift the leg to 60 degrees, hold for 3 sec, and slowly lower the leg. Repeat 10-20 times 2-3 times daily. Perform this exercise against resistance later as your knee gets better.  °Quad and Hamstring Sets - Tighten up the muscle on the front of the thigh (Quad) and hold for 5-10 sec. Repeat this 10-20 times hourly. Hamstring sets are done by pushing the  foot backward against an object and holding for 5-10 sec. Repeat as with quad sets.  °· Leg Slides: Lying on your back, slowly slide your foot toward your buttocks, bending your knee up off the floor (only go as far as is comfortable). Then slowly slide your foot back down until your leg is flat on the floor again. °· Angel Wings: Lying on your back spread your legs to the side as far apart as you can without causing discomfort.  °A rehabilitation program following serious knee injuries can speed recovery and prevent re-injury in the future due to weakened muscles. Contact your doctor or a physical therapist for more information on knee rehabilitation.  ° °IF YOU ARE TRANSFERRED TO A SKILLED REHAB FACILITY °If the patient is transferred to a skilled rehab facility following release from the hospital, a list of the current medications will be sent to the facility for the patient to continue.  When discharged from the skilled rehab facility, please have the facility set up the patient's Home Health Physical Therapy prior to being released. Also, the skilled facility will be responsible for providing the patient with their medications at time of release from the facility to include their pain medication, the muscle relaxants, and their blood thinner medication. If the patient is still at the rehab facility at time of the two week follow up appointment, the skilled rehab facility will also need to assist the patient in arranging follow up appointment in our office and any transportation needs. ° °MAKE SURE YOU:  °Understand these instructions.  °Get help right away if you are not doing well or get worse.  ° ° °Pick up stool softner and laxative for home use following surgery while on pain medications. °Do not submerge incision under water. °Please use good hand washing techniques while changing dressing each day. °May shower starting three days after surgery. °Please use a clean towel to pat the incision dry following  showers. °Continue to use ice for pain and swelling after surgery. °Do not use any lotions or creams on the incision until instructed by your surgeon. ° °Take Xarelto 10 mg daily for ten days, then change to Aspirin 325 mg daily for two weeks, then reduce to Baby Aspirin 81 mg daily for three additional weeks. ° ° °________________________ ° °Information on my   my medicine - XARELTO (Rivaroxaban)  This medication education was reviewed with me or my healthcare representative as part of my discharge preparation.  The pharmacist that spoke with me during my hospital stay was:  Joycelyn Schmid Ceylin Dreibelbis, Student-PharmD  Why was Xarelto prescribed for you? Xarelto was prescribed for you to reduce the risk of blood clots forming after orthopedic surgery. The medical term for these abnormal blood clots is venous thromboembolism (VTE).  What do you need to know about xarelto ? Take your Xarelto ONCE DAILY at the same time every day. You may take it either with or without food.  If you have difficulty swallowing the tablet whole, you may crush it and mix in applesauce just prior to taking your dose.  Take Xarelto exactly as prescribed by your doctor and DO NOT stop taking Xarelto without talking to the doctor who prescribed the medication.  Stopping without other VTE prevention medication to take the place of Xarelto may increase your risk of developing a clot.  After discharge, you should have regular check-up appointments with your healthcare provider that is prescribing your Xarelto.    What do you do if you miss a dose? If you miss a dose, take it as soon as you remember on the same day then continue your regularly scheduled once daily regimen the next day. Do not take two doses of Xarelto on the same day.   Important Safety Information A possible side effect of Xarelto is bleeding. You should call your healthcare provider right away if you experience any of the following: ? Bleeding from an injury or your nose that does  not stop. ? Unusual colored urine (red or dark brown) or unusual colored stools (red or black). ? Unusual bruising for unknown reasons. ? A serious fall or if you hit your head (even if there is no bleeding).  Some medicines may interact with Xarelto and might increase your risk of bleeding while on Xarelto. To help avoid this, consult your healthcare provider or pharmacist prior to using any new prescription or non-prescription medications, including herbals, vitamins, non-steroidal anti-inflammatory drugs (NSAIDs) and supplements.  This website has more information on Xarelto: [email or web address]

## 2016-05-23 NOTE — Discharge Summary (Signed)
Physician Discharge Summary   Patient ID: Kristine Olson MRN: [id number] DOB/AGE: 03-03-1946 70 y.o.  Admit date: 05/22/2016 Discharge date: 05/23/2016  Primary Diagnosis:  Medial compartment osteoarthritis, Right knee  Admission Diagnoses:  Past Medical History:  Diagnosis Date  . Arthritis    ostearthritis - right  medial knee  . Depression   . GERD (gastroesophageal reflux disease)    "heartburn daily"-TUMS helpful  . Hx of sinus tachycardia    rarely any problems since metoprolol use  . Hyperlipidemia   . Hypertension   . Hypothyroidism   . Sleep apnea    uses c pap   Discharge Diagnoses:   Principal Problem:   OA (osteoarthritis) of knee  Estimated body mass index is 37.25 kg/m as calculated from the following:   Height as of this encounter: 5' 4" (1.626 m).   Weight as of this encounter: 98.4 kg (217 lb).  Procedure:  Procedure(s) (LRB): RIGHT KNEE MEDIAL UNICOMPARTMENTAL ARTHROPLASTY (Right)   Consults: None  HPI: Kristine Olson is a 70 y.o. female, who has  significant isolated medial compartment arthritis of the Right knee. The patient has had nonoperative management including injections of cortisone and viscous supplements. Unfortunately, the pain persists.  Radiograph showed isolated medial compartment bone-on-bone arthritis  with normal-appearing patellofemoral and lateral compartments. The patient presents now for left knee unicompartmental arthroplasty.   Laboratory Data: Admission on 05/22/2016  Component Date Value Ref Range Status  . Potassium 05/22/2016 4.7  3.5 - 5.1 mmol/L Final  . WBC 05/23/2016 10.6* 4.0 - 10.5 K/uL Final  . RBC 05/23/2016 4.16  3.87 - 5.11 MIL/uL Final  . Hemoglobin 05/23/2016 12.0  12.0 - 15.0 g/dL Final  . HCT 05/23/2016 37.1  36.0 - 46.0 % Final  . MCV 05/23/2016 89.2  78.0 - 100.0 fL Final  . MCH 05/23/2016 28.8  26.0 - 34.0 pg Final  . MCHC 05/23/2016 32.3  30.0 - 36.0 g/dL Final  . RDW 05/23/2016 13.7  11.5  - 15.5 % Final  . Platelets 05/23/2016 218  150 - 400 K/uL Final  . Sodium 05/23/2016 136  135 - 145 mmol/L Final  . Potassium 05/23/2016 4.4  3.5 - 5.1 mmol/L Final  . Chloride 05/23/2016 103  101 - 111 mmol/L Final  . CO2 05/23/2016 26  22 - 32 mmol/L Final  . Glucose, Bld 05/23/2016 151* 65 - 99 mg/dL Final  . BUN 05/23/2016 18  6 - 20 mg/dL Final  . Creatinine, Ser 05/23/2016 1.01* 0.44 - 1.00 mg/dL Final  . Calcium 05/23/2016 8.8* 8.9 - 10.3 mg/dL Final  . GFR calc non Af Amer 05/23/2016 56* >60 mL/min Final  . GFR calc Af Amer 05/23/2016 >60  >60 mL/min Final   Comment: (NOTE) The eGFR has been calculated using the CKD EPI equation. This calculation has not been validated in all clinical situations. eGFR's persistently <60 mL/min signify possible Chronic Kidney Disease.   Georgiann Hahn gap 05/23/2016 7  5 - 15 Final  Hospital Outpatient Visit on 05/16/2016  Component Date Value Ref Range Status  . MRSA, PCR 05/16/2016 NEGATIVE  NEGATIVE Final  . Staphylococcus aureus 05/16/2016 NEGATIVE  NEGATIVE Final   Comment:        The Xpert SA Assay (FDA approved for NASAL specimens in patients over 17 years of age), is one component of a comprehensive surveillance program.  Test performance has been validated by [facility] for patients greater than or equal to 48 year old. It is  not intended to diagnose infection nor to guide or monitor treatment.   Marland Kitchen aPTT 05/16/2016 32  24 - 37 seconds Final  . WBC 05/16/2016 7.7  4.0 - 10.5 K/uL Final  . RBC 05/16/2016 4.45  3.87 - 5.11 MIL/uL Final  . Hemoglobin 05/16/2016 12.9  12.0 - 15.0 g/dL Final  . HCT 05/16/2016 39.8  36.0 - 46.0 % Final  . MCV 05/16/2016 89.4  78.0 - 100.0 fL Final  . MCH 05/16/2016 29.0  26.0 - 34.0 pg Final  . MCHC 05/16/2016 32.4  30.0 - 36.0 g/dL Final  . RDW 05/16/2016 13.7  11.5 - 15.5 % Final  . Platelets 05/16/2016 225  150 - 400 K/uL Final  . Sodium 05/16/2016 139  135 - 145 mmol/L Final  . Potassium  05/16/2016 6.0* 3.5 - 5.1 mmol/L Final  . Chloride 05/16/2016 105  101 - 111 mmol/L Final  . CO2 05/16/2016 29  22 - 32 mmol/L Final  . Glucose, Bld 05/16/2016 91  65 - 99 mg/dL Final  . BUN 05/16/2016 16  6 - 20 mg/dL Final  . Creatinine, Ser 05/16/2016 1.19* 0.44 - 1.00 mg/dL Final  . Calcium 05/16/2016 9.8  8.9 - 10.3 mg/dL Final  . Total Protein 05/16/2016 7.8  6.5 - 8.1 g/dL Final  . Albumin 05/16/2016 4.1  3.5 - 5.0 g/dL Final  . AST 05/16/2016 24  15 - 41 U/L Final  . ALT 05/16/2016 13* 14 - 54 U/L Final  . Alkaline Phosphatase 05/16/2016 58  38 - 126 U/L Final  . Total Bilirubin 05/16/2016 0.7  0.3 - 1.2 mg/dL Final  . GFR calc non Af Amer 05/16/2016 46* >60 mL/min Final  . GFR calc Af Amer 05/16/2016 54* >60 mL/min Final   Comment: (NOTE) The eGFR has been calculated using the CKD EPI equation. This calculation has not been validated in all clinical situations. eGFR's persistently <60 mL/min signify possible Chronic Kidney Disease.   . Anion gap 05/16/2016 5  5 - 15 Final  . Prothrombin Time 05/16/2016 14.2  11.6 - 15.2 seconds Final  . INR 05/16/2016 1.13  0.00 - 1.49 Final  . ABO/RH(D) 05/22/2016 A POS   Final  . Antibody Screen 05/22/2016 NEG   Final  . Sample Expiration 05/22/2016 05/25/2016   Final  . Extend sample reason 05/22/2016 NO TRANSFUSIONS OR PREGNANCY IN THE PAST 3 MONTHS   Final  . Color, Urine 05/16/2016 YELLOW  YELLOW Final  . APPearance 05/16/2016 CLOUDY* CLEAR Final  . Specific Gravity, Urine 05/16/2016 1.016  1.005 - 1.030 Final  . pH 05/16/2016 7.5  5.0 - 8.0 Final  . Glucose, UA 05/16/2016 NEGATIVE  NEGATIVE mg/dL Final  . Hgb urine dipstick 05/16/2016 NEGATIVE  NEGATIVE Final  . Bilirubin Urine 05/16/2016 NEGATIVE  NEGATIVE Final  . Ketones, ur 05/16/2016 NEGATIVE  NEGATIVE mg/dL Final  . Protein, ur 05/16/2016 NEGATIVE  NEGATIVE mg/dL Final  . Nitrite 05/16/2016 NEGATIVE  NEGATIVE Final  . Leukocytes, UA 05/16/2016 MODERATE* NEGATIVE Final  .  Squamous Epithelial / LPF 05/16/2016 0-5* NONE SEEN Final  . WBC, UA 05/16/2016 0-5  0 - 5 WBC/hpf Final  . RBC / HPF 05/16/2016 0-5  0 - 5 RBC/hpf Final  . Bacteria, UA 05/16/2016 RARE* NONE SEEN Final  . ABO/RH(D) 05/16/2016 A POS   Final  Office Visit on 04/17/2016  Component Date Value Ref Range Status  . Cholesterol 04/17/2016 183  0 - 200 mg/dL Final  . Triglycerides 04/17/2016 100.0  0.0 - 149.0 mg/dL Final  . HDL 04/17/2016 72.00  >39.00 mg/dL Final  . VLDL 04/17/2016 20.0  0.0 - 40.0 mg/dL Final  . LDL Cholesterol 04/17/2016 91  0 - 99 mg/dL Final  . Total CHOL/HDL Ratio 04/17/2016 3   Final  . NonHDL 04/17/2016 111.02   Final  . Sodium 04/17/2016 139  135 - 145 mEq/L Final  . Potassium 04/17/2016 5.0  3.5 - 5.1 mEq/L Final  . Chloride 04/17/2016 103  96 - 112 mEq/L Final  . CO2 04/17/2016 29  19 - 32 mEq/L Final  . Glucose, Bld 04/17/2016 97  70 - 99 mg/dL Final  . BUN 04/17/2016 16  6 - 23 mg/dL Final  . Creatinine, Ser 04/17/2016 1.18  0.40 - 1.20 mg/dL Final  . Calcium 04/17/2016 9.8  8.4 - 10.5 mg/dL Final  . GFR 04/17/2016 48.44* >60.00 mL/min Final  . TSH 04/17/2016 0.94  0.35 - 4.50 uIU/mL Final  . Total Bilirubin 04/17/2016 0.4  0.2 - 1.2 mg/dL Final  . Bilirubin, Direct 04/17/2016 0.1  0.0 - 0.3 mg/dL Final  . Alkaline Phosphatase 04/17/2016 54  39 - 117 U/L Final  . AST 04/17/2016 16  0 - 37 U/L Final  . ALT 04/17/2016 12  0 - 35 U/L Final  . Total Protein 04/17/2016 6.8  6.0 - 8.3 g/dL Final  . Albumin 04/17/2016 4.1  3.5 - 5.2 g/dL Final  . WBC 04/17/2016 7.5  4.0 - 10.5 K/uL Final  . RBC 04/17/2016 4.64  3.87 - 5.11 Mil/uL Final  . Hemoglobin 04/17/2016 13.4  12.0 - 15.0 g/dL Final  . HCT 04/17/2016 41.0  36.0 - 46.0 % Final  . MCV 04/17/2016 88.4  78.0 - 100.0 fl Final  . MCHC 04/17/2016 32.8  30.0 - 36.0 g/dL Final  . RDW 04/17/2016 14.1  11.5 - 15.5 % Final  . Platelets 04/17/2016 209.0  150.0 - 400.0 K/uL Final  . Neutrophils Relative % 04/17/2016  73.5  43.0 - 77.0 % Final  . Lymphocytes Relative 04/17/2016 17.1  12.0 - 46.0 % Final  . Monocytes Relative 04/17/2016 6.2  3.0 - 12.0 % Final  . Eosinophils Relative 04/17/2016 2.6  0.0 - 5.0 % Final  . Basophils Relative 04/17/2016 0.6  0.0 - 3.0 % Final  . Neutro Abs 04/17/2016 5.5  1.4 - 7.7 K/uL Final  . Lymphs Abs 04/17/2016 1.3  0.7 - 4.0 K/uL Final  . Monocytes Absolute 04/17/2016 0.5  0.1 - 1.0 K/uL Final  . Eosinophils Absolute 04/17/2016 0.2  0.0 - 0.7 K/uL Final  . Basophils Absolute 04/17/2016 0.0  0.0 - 0.1 K/uL Final  . VITD 04/17/2016 29.98* 30.00 - 100.00 ng/mL Final     X-Rays:No results found.  EKG: Orders placed or performed during the hospital encounter of 05/16/16  . EKG 12-Lead  . EKG 12-Lead     Hospital Course: Kristine Olson is a 70 y.o. who was admitted to [facility]. They were brought to the operating room on 05/22/2016 and underwent Procedure(s): RIGHT KNEE MEDIAL UNICOMPARTMENTAL ARTHROPLASTY.  Patient tolerated the procedure well and was later transferred to the recovery room and then to the orthopaedic floor for postoperative care.  They were given PO and IV analgesics for pain control following their surgery.  They were given 24 hours of postoperative antibiotics of  Anti-infectives    Start     Dose/Rate Route Frequency Ordered Stop   05/23/16 0300  vancomycin (VANCOCIN) IVPB 1000  mg/200 mL premix     1,000 mg 200 mL/hr over 60 Minutes Intravenous Every 12 hours 05/22/16 1804 05/23/16 0241   05/22/16 1245  vancomycin (VANCOCIN) IVPB 1000 mg/200 mL premix     1,000 mg 200 mL/hr over 60 Minutes Intravenous On call to O.R. 05/22/16 1229 05/22/16 1519     and started on DVT prophylaxis in the form of Xarelto.   PT and OT were ordered for postop therapy protocol.  Discharge planning consulted to help with postop disposition and equipment needs.  Patient had a tough night on the evening of surgery.  They started to get up OOB with therapy on  day one. Hemovac drain was pulled without difficulty.  Patient was seen in rounds on day one and it was felt that as long as they did well with the remaining sessions of therapy that they would be ready to go home.  Arrangements were made and they were setup to go home on POD 1.  Discharge home with home health Diet - Cardiac diet Follow up - in 2 weeks Activity - WBAT Dressing - May remove the surgical dressing tomorrow at home and then apply a dry gauze dressing daily. May shower three days following surgery but do not submerge the incision under water. Disposition - Home Condition Upon Discharge - Good D/C Meds - See DC Summary DVT Prophylaxis Xarelto 10 mg daily for ten days, then change to Aspirin 325 mg daily for two weeks, then reduce to Baby Aspirin 81 mg daily for three additional weeks.  Discharge Instructions    Call MD / Call 911    Complete by:  As directed   If you experience chest pain or shortness of breath, CALL 911 and be transported to the hospital emergency room.  If you develope a fever above 101 F, pus (white drainage) or increased drainage or redness at the wound, or calf pain, call your surgeon's office.   Change dressing    Complete by:  As directed   Change dressing daily with sterile 4 x 4 inch gauze dressing and apply TED hose. Do not submerge the incision under water.   Constipation Prevention    Complete by:  As directed   Drink plenty of fluids.  Prune juice may be helpful.  You may use a stool softener, such as Colace (over the counter) 100 mg twice a day.  Use MiraLax (over the counter) for constipation as needed.   Diet - low sodium heart healthy    Complete by:  As directed   Discharge instructions    Complete by:  As directed   Pick up stool softner and laxative for home use following surgery while on pain medications. Do not submerge incision under water. Please use good hand washing techniques while changing dressing each day. May shower starting three  days after surgery. Please use a clean towel to pat the incision dry following showers. Continue to use ice for pain and swelling after surgery. Do not use any lotions or creams on the incision until instructed by your surgeon.   Postoperative Constipation Protocol  Constipation - defined medically as fewer than three stools per week and severe constipation as less than one stool per week.  One of the most common issues patients have following surgery is constipation.  Even if you have a regular bowel pattern at home, your normal regimen is likely to be disrupted due to multiple reasons following surgery.  Combination of anesthesia, postoperative narcotics, change in  appetite and fluid intake all can affect your bowels.  In order to avoid complications following surgery, here are some recommendations in order to help you during your recovery period.  Colace (docusate) - Pick up an over-the-counter form of Colace or another stool softener and take twice a day as long as you are requiring postoperative pain medications.  Take with a full glass of water daily.  If you experience loose stools or diarrhea, hold the colace until you stool forms back up.  If your symptoms do not get better within 1 week or if they get worse, check with your doctor.  Dulcolax (bisacodyl) - Pick up over-the-counter and take as directed by the product packaging as needed to assist with the movement of your bowels.  Take with a full glass of water.  Use this product as needed if not relieved by Colace only.   MiraLax (polyethylene glycol) - Pick up over-the-counter to have on hand.  MiraLax is a solution that will increase the amount of water in your bowels to assist with bowel movements.  Take as directed and can mix with a glass of water, juice, soda, coffee, or tea.  Take if you go more than two days without a movement. Do not use MiraLax more than once per day. Call your doctor if you are still constipated or irregular  after using this medication for 7 days in a row.  If you continue to have problems with postoperative constipation, please contact the office for further assistance and recommendations.  If you experience "the worst abdominal pain ever" or develop nausea or vomiting, please contact the office immediatly for further recommendations for treatment.   Xarelto 10 mg daily for ten days, then change to Aspirin 325 mg daily for two weeks, then reduce to Baby Aspirin 81 mg daily for three additional weeks.   Do not put a pillow under the knee. Place it under the heel.    Complete by:  As directed   Do not sit on low chairs, stoools or toilet seats, as it may be difficult to get up from low surfaces    Complete by:  As directed   Driving restrictions    Complete by:  As directed   No driving until released by the physician.   Increase activity slowly as tolerated    Complete by:  As directed   Lifting restrictions    Complete by:  As directed   No lifting until released by the physician.   Patient may shower    Complete by:  As directed   You may shower without a dressing once there is no drainage.  Do not wash over the wound.  If drainage remains, do not shower until drainage stops.   TED hose    Complete by:  As directed   Use stockings (TED hose) for 3 weeks on both leg(s).  You may remove them at night for sleeping.   Weight bearing as tolerated    Complete by:  As directed   Laterality:  right   Extremity:  Lower       Medication List    STOP taking these medications   cholecalciferol 1000 units tablet Commonly known as:  VITAMIN D   ibuprofen 200 MG tablet Commonly known as:  ADVIL,MOTRIN     TAKE these medications   calcium carbonate 750 MG chewable tablet Commonly known as:  TUMS EX Chew 1 tablet by mouth daily as needed for heartburn.   cetirizine 10  MG tablet Commonly known as:  ZYRTEC Take 10 mg by mouth daily as needed for allergies.   citalopram 20 MG tablet Commonly  known as:  CELEXA Take 20 mg by mouth. 1  by mouth daily   levothyroxine 112 MCG tablet Commonly known as:  SYNTHROID, LEVOTHROID TAKE 1 TABLET DAILY EXCEPT FOR 1 AND 1/2 TABLETS ON WEDNESDAY   methocarbamol 500 MG tablet Commonly known as:  ROBAXIN Take 1 tablet (500 mg total) by mouth every 6 (six) hours as needed for muscle spasms.   metoprolol tartrate 25 MG tablet Commonly known as:  LOPRESSOR TAKE 0.5 TABLETS (12.5 MG TOTAL) BY MOUTH 2 (TWO) TIMES DAILY. What changed:  how much to take  how to take this  when to take this  additional instructions   oxyCODONE 5 MG immediate release tablet Commonly known as:  Oxy IR/ROXICODONE Take 1-2 tablets (5-10 mg total) by mouth every 3 (three) hours as needed for breakthrough pain.   rivaroxaban 10 MG Tabs tablet Commonly known as:  XARELTO Take 1 tablet (10 mg total) by mouth daily with breakfast. Xarelto 10 mg daily for ten days, then change to Aspirin 325 mg daily for two weeks, then reduce to Baby Aspirin 81 mg daily for three additional weeks.   spironolactone 25 MG tablet Commonly known as:  ALDACTONE Take 1 tablet (25 mg total) by mouth daily.      Follow-up Information    Gearlean Alf, MD Follow up on 06/06/2016.   Specialty:  Orthopedic Surgery Why:  Call office at [phone] to setup appointment on Tuesday 06/06/2016 with Dr. Wynelle Link. Contact information: [address] [place] [address] [phone]           Signed: Arlee Muslim, PA-C Orthopaedic Surgery 05/23/2016, 8:38 AM

## 2016-05-23 NOTE — Progress Notes (Signed)
Occupational Therapy Evaluation Patient Details Name: Kristine Olson MRN: [id number] DOB: 01-17-1946 Today's Date: 05/23/2016    History of Present Illness s/p R uni knee   Clinical Impression   This 70 year old female was admitted for the above sx. All education was completed. No further OT is needed at this time    Follow Up Recommendations  Supervision/Assistance - 24 hour    Equipment Recommendations  3 in 1 bedside comode    Recommendations for Other Services       Precautions / Restrictions Precautions Precautions: Knee Restrictions Weight Bearing Restrictions: No Other Position/Activity Restrictions: WBAT      Mobility Bed Mobility Overal bed mobility: Needs Assistance Bed Mobility: Supine to Sit     Supine to sit: Min assist;Min guard     General bed mobility comments: oob  Transfers Overall transfer level: Needs assistance Equipment used: Rolling walker (2 wheeled) Transfers: Sit to/from Stand Sit to Stand: Min guard         General transfer comment: for safety; cues for UE/LE placement    Balance                                            ADL Overall ADL's : Needs assistance/impaired     Grooming: Oral care;Supervision/safety;Standing   Upper Body Bathing: Set up;Sitting   Lower Body Bathing: Minimal assistance;Sit to/from stand   Upper Body Dressing : Minimal assistance;Sitting (iv)   Lower Body Dressing: Moderate assistance;Sit to/from stand   Toilet Transfer: Min guard;Ambulation;BSC;RW   Toileting- Water quality scientist and Hygiene: Min guard;Sit to/from stand         General ADL Comments: ambulated to bathroom and performed ADL from [facility]. Gave pt handout for shower transfer:  she was not ready to practice at this time. She verbalizes understanding and cannot take a shower for a couple more days     Vision     Perception     Praxis      Pertinent Vitals/Pain Pain Assessment: 0-10 Pain Score: 4   Pain Location: R knee Pain Descriptors / Indicators: Aching Pain Intervention(s): Limited activity within patient's tolerance;Monitored during session;Premedicated before session;Repositioned;Ice applied     Hand Dominance     Extremity/Trunk Assessment Upper Extremity Assessment Upper Extremity Assessment: Overall WFL for tasks assessed   Lower Extremity Assessment Lower Extremity Assessment: RLE deficits/detail RLE Deficits / Details: ankle WFL, knee extension and hip flexion 2+/5, expected post op weakness       Communication Communication Communication: No difficulties   Cognition Arousal/Alertness: Awake/alert Behavior During Therapy: WFL for tasks assessed/performed Overall Cognitive Status: Within Functional Limits for tasks assessed                     General Comments       Exercises       Shoulder Instructions      Home Living Family/patient expects to be discharged to:: Private residence Living Arrangements: Spouse/significant other Available Help at Discharge: Family Type of Home: House Home Access: Stairs to enter Technical brewer of Steps: 1   Home Layout: One level     Bathroom Shower/Tub: Occupational psychologist: Standard     Home Equipment: None          Prior Functioning/Environment Level of Independence: Independent  OT Diagnosis: Acute pain   OT Problem List:     OT Treatment/Interventions:      OT Goals(Current goals can be found in the care plan section) Acute Rehab OT Goals Patient Stated Goal: less knee pain OT Goal Formulation: All assessment and education complete, DC therapy  OT Frequency:     Barriers to D/C:            Co-evaluation              End of Session CPM Right Knee CPM Right Knee: Off  Activity Tolerance: Patient tolerated treatment well Patient left: in chair;with call bell/phone within reach;with chair alarm set;with family/visitor present   Time:  [id number] OT Time Calculation (min): 25 min Charges:  OT General Charges $OT Visit: 1 Procedure OT Evaluation $OT Eval Low Complexity: 1 Procedure OT Treatments $Self Care/Home Management : 8-22 mins G-Codes: OT G-codes **NOT FOR INPATIENT CLASS** Functional Assessment Tool Used: clinical observation Functional Limitation: Self care Self Care Current Status [id number]): At least 40 percent but less than 60 percent impaired, limited or restricted Self Care Goal Status [id number]): At least 40 percent but less than 60 percent impaired, limited or restricted Self Care Discharge Status [phone]): At least 40 percent but less than 60 percent impaired, limited or restricted  Brandom Kerwin 05/23/2016, 11:03 AM  Lesle Chris, OTR/L [phone] 05/23/2016

## 2016-05-23 NOTE — Progress Notes (Addendum)
   Subjective: 1 Day Post-Op Procedure(s) (LRB): RIGHT KNEE MEDIAL UNICOMPARTMENTAL ARTHROPLASTY (Right) Patient reports pain as mild and moderate last night but better this morning. Patient seen in rounds with Dr. Wynelle Link. Patient is well, but has had some minor complaints of pain in the knee, requiring pain medications Patient is ready to go home later today after therapy.  Objective: Vital signs in last 24 hours: Temp:  [97.4 F (36.3 C)-98.6 F (37 C)] 97.5 F (36.4 C) (08/01 0615) Pulse Rate:  [61-84] 61 (08/01 0615) Resp:  [10-20] 16 (08/01 0615) BP: (114-131)/(65-77) 122/69 (08/01 0615) SpO2:  [94 %-100 %] 97 % (08/01 0615) Weight:  [98.4 kg (217 lb)] 98.4 kg (217 lb) (07/31 1800)  Intake/Output from previous day:  Intake/Output Summary (Last 24 hours) at 05/23/16 0831 Last data filed at 05/23/16 0800  Gross per 24 hour  Intake          2690.33 ml  Output              420 ml  Net          2270.33 ml    Intake/Output this shift: Total I/O In: 240 [P.O.:240] Out: -   Labs:  Recent Labs  05/23/16 0412  HGB 12.0    Recent Labs  05/23/16 0412  WBC 10.6*  RBC 4.16  HCT 37.1  PLT 218    Recent Labs  05/22/16 1245 05/23/16 0412  NA  --  136  K 4.7 4.4  CL  --  103  CO2  --  26  BUN  --  18  CREATININE  --  1.01*  GLUCOSE  --  151*  CALCIUM  --  8.8*   No results for input(s): LABPT, INR in the last 72 hours.  EXAM: General - Patient is Alert, Appropriate and Oriented Extremity - Neurovascular intact Sensation intact distally Dorsiflexion/Plantar flexion intact Dressing - clean, dry, no drainage Motor Function - intact, moving foot and toes well on exam.  Hemovac pulled without difficulty.  Assessment/Plan: 1 Day Post-Op Procedure(s) (LRB): RIGHT KNEE MEDIAL UNICOMPARTMENTAL ARTHROPLASTY (Right) Procedure(s) (LRB): RIGHT KNEE MEDIAL UNICOMPARTMENTAL ARTHROPLASTY (Right) Past Medical History:  Diagnosis Date  . Arthritis    ostearthritis - right  medial knee  . Depression   . GERD (gastroesophageal reflux disease)    "heartburn daily"-TUMS helpful  . Hx of sinus tachycardia    rarely any problems since metoprolol use  . Hyperlipidemia   . Hypertension   . Hypothyroidism   . Sleep apnea    uses c pap   Principal Problem:   OA (osteoarthritis) of knee  Estimated body mass index is 37.25 kg/m as calculated from the following:   Height as of this encounter: 5\' 4"  (1.626 m).   Weight as of this encounter: 98.4 kg (217 lb). Advance diet Up with therapy Discharge home Diet - Cardiac diet Follow up - in 2 weeks Activity - WBAT Dressing - May remove the surgical dressing tomorrow at home and then apply a dry gauze dressing daily. May shower three days following surgery but do not submerge the incision under water. Disposition - Home Condition Upon Discharge - Good D/C Meds - See DC Summary DVT Prophylaxis Xarelto 10 mg daily for ten days, then change to Aspirin 325 mg daily for two weeks, then reduce to Baby Aspirin 81 mg daily for three additional weeks.  Arlee Muslim, PA-C Orthopaedic Surgery 05/23/2016, 8:31 AM

## 2016-05-23 NOTE — Progress Notes (Signed)
Cm met with pt in room to offer choice of home health agency.  Pt chooses [facility] to render HHPT.  Referral given to [employer], Tim.  Cm called [facility] DME rep, Jeneen Rinks to please deliver the rolling walker and 3n1 to room so pt can discharge.  No other CM needs were communicated.

## 2016-05-23 NOTE — Evaluation (Signed)
Physical Therapy Evaluation Patient Details Name: Kristine Olson MRN: [id number] DOB: [birth year] Today's Date: 05/23/2016   History of Present Illness  R UKR  Clinical Impression  Pt is s/p UKA resulting in the deficits listed below (see PT Problem List).  Pt will benefit from skilled PT to increase their independence and safety with mobility to allow discharge to the venue listed below.      Follow Up Recommendations Home health PT    Equipment Recommendations  Rolling walker with 5" wheels    Recommendations for Other Services       Precautions / Restrictions Precautions Precautions: Knee Restrictions Weight Bearing Restrictions: No Other Position/Activity Restrictions: WBAT      Mobility  Bed Mobility Overal bed mobility: Needs Assistance Bed Mobility: Supine to Sit     Supine to sit: Min assist;Min guard     General bed mobility comments: cues to self assist, incr time  Transfers Overall transfer level: Needs assistance Equipment used: Rolling walker (2 wheeled) Transfers: Sit to/from Stand Sit to Stand: Min assist         General transfer comment: cues for hand placement and RLE management  Ambulation/Gait Ambulation/Gait assistance: Min assist;Min guard Ambulation Distance (Feet): 55 Feet Assistive device: Rolling walker (2 wheeled) Gait Pattern/deviations: Step-to pattern;Decreased weight shift to right     General Gait Details: cues for sequence, RW position from self  Stairs            Wheelchair Mobility    Modified Rankin (Stroke Patients Only)       Balance                                             Pertinent Vitals/Pain Pain Assessment: 0-10 Pain Score: 3  Pain Location: R knee Pain Descriptors / Indicators: Aching;Sore Pain Intervention(s): Limited activity within patient's tolerance;Monitored during session;Premedicated before session;Ice applied;Repositioned    Home Living Family/patient  expects to be discharged to:: Private residence Living Arrangements: Spouse/significant other   Type of Home: House Home Access: Stairs to enter   Technical brewer of Steps: 1 Home Layout: One level Home Equipment: None      Prior Function Level of Independence: Independent               Hand Dominance        Extremity/Trunk Assessment   Upper Extremity Assessment: Defer to OT evaluation           Lower Extremity Assessment: RLE deficits/detail RLE Deficits / Details: ankle WFL, knee extension and hip flexion 2+/5, expected post op weakness       Communication   Communication: No difficulties  Cognition Arousal/Alertness: Awake/alert Behavior During Therapy: WFL for tasks assessed/performed Overall Cognitive Status: Within Functional Limits for tasks assessed                      General Comments      Exercises Total Joint Exercises Ankle Circles/Pumps: AROM;Both;10 reps Quad Sets: 10 reps;Both;AROM      Assessment/Plan    PT Assessment Patient needs continued PT services  PT Diagnosis Difficulty walking   PT Problem List Decreased strength;Decreased range of motion;Decreased activity tolerance;Decreased knowledge of use of DME;Decreased mobility;Decreased knowledge of precautions  PT Treatment Interventions DME instruction;Gait training;Functional mobility training;Therapeutic exercise   PT Goals (Current goals can be found in the Care Plan section)  Acute Rehab PT Goals Patient Stated Goal: less knee pain PT Goal Formulation: With patient Time For Goal Achievement: 05/26/16 Potential to Achieve Goals: Good    Frequency 7X/week   Barriers to discharge        Co-evaluation               End of Session Equipment Utilized During Treatment: Gait belt Activity Tolerance: Patient tolerated treatment well Patient left: with call bell/phone within reach;in chair;with chair alarm set;with family/visitor present Nurse  Communication: Mobility status         Time: [id number] PT Time Calculation (min) (ACUTE ONLY): 27 min   Charges:   PT Evaluation $PT Eval Low Complexity: 1 Procedure PT Treatments $Gait Training: 8-22 mins   PT G Codes:        Demarkis Gheen 05/30/16, 10:28 AM

## 2016-05-29 ENCOUNTER — Encounter (HOSPITAL_COMMUNITY)
Admission: EM | Disposition: A | Discharge status: Home Health | Payer: Self-pay | Source: Home / Self Care | Attending: Internal Medicine

## 2016-05-29 ENCOUNTER — Inpatient Hospital Stay (HOSPITAL_COMMUNITY): Payer: Medicare Other | Admitting: Anesthesiology

## 2016-05-29 ENCOUNTER — Encounter (HOSPITAL_COMMUNITY): Payer: Self-pay | Admitting: Emergency Medicine

## 2016-05-29 ENCOUNTER — Inpatient Hospital Stay (HOSPITAL_COMMUNITY)
Admission: EM | Admit: 2016-05-29 | Discharge: 2016-05-30 | DRG: 378 | Disposition: A | Discharge status: Home Health | Payer: Medicare Other | Attending: Family Medicine | Admitting: Family Medicine

## 2016-05-29 DIAGNOSIS — M1710 Unilateral primary osteoarthritis, unspecified knee: Secondary | ICD-10-CM | POA: Diagnosis present

## 2016-05-29 DIAGNOSIS — R000 Tachycardia, unspecified: Secondary | ICD-10-CM | POA: Diagnosis present

## 2016-05-29 DIAGNOSIS — E785 Hyperlipidemia, unspecified: Secondary | ICD-10-CM | POA: Diagnosis present

## 2016-05-29 DIAGNOSIS — Z8249 Family history of ischemic heart disease and other diseases of the circulatory system: Secondary | ICD-10-CM | POA: Diagnosis not present

## 2016-05-29 DIAGNOSIS — Z96651 Presence of right artificial knee joint: Secondary | ICD-10-CM | POA: Diagnosis present

## 2016-05-29 DIAGNOSIS — K449 Diaphragmatic hernia without obstruction or gangrene: Secondary | ICD-10-CM | POA: Diagnosis present

## 2016-05-29 DIAGNOSIS — K250 Acute gastric ulcer with hemorrhage: Secondary | ICD-10-CM | POA: Diagnosis present

## 2016-05-29 DIAGNOSIS — F329 Major depressive disorder, single episode, unspecified: Secondary | ICD-10-CM | POA: Diagnosis present

## 2016-05-29 DIAGNOSIS — K219 Gastro-esophageal reflux disease without esophagitis: Secondary | ICD-10-CM | POA: Diagnosis present

## 2016-05-29 DIAGNOSIS — Z79899 Other long term (current) drug therapy: Secondary | ICD-10-CM

## 2016-05-29 DIAGNOSIS — Z86010 Personal history of colonic polyps: Secondary | ICD-10-CM | POA: Diagnosis not present

## 2016-05-29 DIAGNOSIS — K253 Acute gastric ulcer without hemorrhage or perforation: Secondary | ICD-10-CM

## 2016-05-29 DIAGNOSIS — K5900 Constipation, unspecified: Secondary | ICD-10-CM | POA: Diagnosis present

## 2016-05-29 DIAGNOSIS — M170 Bilateral primary osteoarthritis of knee: Secondary | ICD-10-CM | POA: Diagnosis not present

## 2016-05-29 DIAGNOSIS — D62 Acute posthemorrhagic anemia: Secondary | ICD-10-CM | POA: Diagnosis present

## 2016-05-29 DIAGNOSIS — G4733 Obstructive sleep apnea (adult) (pediatric): Secondary | ICD-10-CM | POA: Diagnosis present

## 2016-05-29 DIAGNOSIS — E039 Hypothyroidism, unspecified: Secondary | ICD-10-CM | POA: Diagnosis present

## 2016-05-29 DIAGNOSIS — I10 Essential (primary) hypertension: Secondary | ICD-10-CM | POA: Diagnosis present

## 2016-05-29 DIAGNOSIS — K274 Chronic or unspecified peptic ulcer, site unspecified, with hemorrhage: Secondary | ICD-10-CM | POA: Diagnosis not present

## 2016-05-29 DIAGNOSIS — K254 Chronic or unspecified gastric ulcer with hemorrhage: Secondary | ICD-10-CM | POA: Diagnosis not present

## 2016-05-29 DIAGNOSIS — R42 Dizziness and giddiness: Secondary | ICD-10-CM | POA: Diagnosis not present

## 2016-05-29 DIAGNOSIS — K922 Gastrointestinal hemorrhage, unspecified: Secondary | ICD-10-CM

## 2016-05-29 DIAGNOSIS — M179 Osteoarthritis of knee, unspecified: Secondary | ICD-10-CM | POA: Diagnosis present

## 2016-05-29 DIAGNOSIS — D509 Iron deficiency anemia, unspecified: Secondary | ICD-10-CM | POA: Diagnosis present

## 2016-05-29 HISTORY — PX: ESOPHAGOGASTRODUODENOSCOPY (EGD) WITH PROPOFOL: SHX5813

## 2016-05-29 LAB — RETICULOCYTES
RBC.: 2.35 MIL/uL — ABNORMAL LOW (ref 3.87–5.11)
RETIC COUNT ABSOLUTE: 105.8 10*3/uL (ref 19.0–186.0)
RETIC CT PCT: 4.5 % — AB (ref 0.4–3.1)

## 2016-05-29 LAB — CBC WITH DIFFERENTIAL/PLATELET
BASOS ABS: 0 10*3/uL (ref 0.0–0.1)
Basophils Relative: 0 %
EOS PCT: 1 %
Eosinophils Absolute: 0.1 10*3/uL (ref 0.0–0.7)
HEMATOCRIT: 23.5 % — AB (ref 36.0–46.0)
HEMOGLOBIN: 7.9 g/dL — AB (ref 12.0–15.0)
LYMPHS ABS: 1.8 10*3/uL (ref 0.7–4.0)
LYMPHS PCT: 15 %
MCH: 29.3 pg (ref 26.0–34.0)
MCHC: 33.6 g/dL (ref 30.0–36.0)
MCV: 87 fL (ref 78.0–100.0)
Monocytes Absolute: 0.6 10*3/uL (ref 0.1–1.0)
Monocytes Relative: 5 %
NEUTROS ABS: 9.7 10*3/uL — AB (ref 1.7–7.7)
Neutrophils Relative %: 79 %
Platelets: 221 10*3/uL (ref 150–400)
RBC: 2.7 MIL/uL — AB (ref 3.87–5.11)
RDW: 14.3 % (ref 11.5–15.5)
WBC: 12.2 10*3/uL — AB (ref 4.0–10.5)

## 2016-05-29 LAB — HEMOGLOBIN AND HEMATOCRIT, BLOOD
HCT: 20.4 % — ABNORMAL LOW (ref 36.0–46.0)
HEMATOCRIT: 19.9 % — AB (ref 36.0–46.0)
Hemoglobin: 6.8 g/dL — CL (ref 12.0–15.0)
Hemoglobin: 6.5 g/dL — CL (ref 12.0–15.0)

## 2016-05-29 LAB — IRON AND TIBC
IRON: 27 ug/dL — AB (ref 28–170)
Saturation Ratios: 7 % — ABNORMAL LOW (ref 10.4–31.8)
TIBC: 378 ug/dL (ref 250–450)
UIBC: 351 ug/dL

## 2016-05-29 LAB — PROTIME-INR
INR: 1.17
INR: 1.09
PROTHROMBIN TIME: 15 s (ref 11.4–15.2)
Prothrombin Time: 14.2 seconds (ref 11.4–15.2)

## 2016-05-29 LAB — BASIC METABOLIC PANEL
ANION GAP: 8 (ref 5–15)
BUN: 41 mg/dL — AB (ref 6–20)
CHLORIDE: 100 mmol/L — AB (ref 101–111)
CO2: 26 mmol/L (ref 22–32)
Calcium: 9.7 mg/dL (ref 8.9–10.3)
Creatinine, Ser: 1.06 mg/dL — ABNORMAL HIGH (ref 0.44–1.00)
GFR calc Af Amer: >60 mL/min (ref >60)
GFR, EST NON AFRICAN AMERICAN: 53 mL/min — AB (ref >60)
GLUCOSE: 124 mg/dL — AB (ref 65–99)
POTASSIUM: 4.4 mmol/L (ref 3.5–5.1)
Sodium: 134 mmol/L — ABNORMAL LOW (ref 135–145)

## 2016-05-29 LAB — FOLATE: Folate: 14.9 ng/mL (ref >5.9)

## 2016-05-29 LAB — FERRITIN: FERRITIN: 24 ng/mL (ref 11–307)

## 2016-05-29 LAB — PREPARE RBC (CROSSMATCH)

## 2016-05-29 LAB — POC OCCULT BLOOD, ED: Fecal Occult Bld: POSITIVE — AB

## 2016-05-29 LAB — VITAMIN B12: Vitamin B-12: 136 pg/mL — ABNORMAL LOW (ref 180–914)

## 2016-05-29 LAB — MAGNESIUM: MAGNESIUM: 1.9 mg/dL (ref 1.7–2.4)

## 2016-05-29 SURGERY — ESOPHAGOGASTRODUODENOSCOPY (EGD) WITH PROPOFOL
Anesthesia: Monitor Anesthesia Care

## 2016-05-29 SURGERY — CANCELLED PROCEDURE

## 2016-05-29 MED ORDER — METOPROLOL TARTRATE 25 MG PO TABS
25.0000 mg | ORAL_TABLET | Freq: Every day | ORAL | Status: DC
  Administered 2016-05-30: 25 mg via ORAL
  Filled 2016-05-29: qty 1

## 2016-05-29 MED ORDER — SODIUM CHLORIDE 0.9 % IV SOLN
80.0000 mg | Freq: Once | INTRAVENOUS | Status: AC
Start: 2016-05-29 — End: 2016-05-29
  Administered 2016-05-29: 80 mg via INTRAVENOUS
  Filled 2016-05-29: qty 80

## 2016-05-29 MED ORDER — CITALOPRAM HYDROBROMIDE 20 MG PO TABS
20.0000 mg | ORAL_TABLET | Freq: Every day | ORAL | Status: DC
  Administered 2016-05-30: 20 mg via ORAL
  Filled 2016-05-29: qty 2
  Filled 2016-05-29: qty 1

## 2016-05-29 MED ORDER — POTASSIUM CHLORIDE IN NACL 20-0.9 MEQ/L-% IV SOLN
INTRAVENOUS | Status: DC
  Administered 2016-05-29: 12:00:00 via INTRAVENOUS
  Filled 2016-05-29 (×2): qty 1000

## 2016-05-29 MED ORDER — PANTOPRAZOLE SODIUM 40 MG IV SOLR: 40.0000 mg | Freq: Two times a day (BID) | INTRAVENOUS | Status: DC

## 2016-05-29 MED ORDER — DIPHENHYDRAMINE HCL 50 MG/ML IJ SOLN: 25.0000 mg | Freq: Four times a day (QID) | INTRAMUSCULAR | Status: DC | PRN

## 2016-05-29 MED ORDER — MORPHINE SULFATE (PF) 2 MG/ML IV SOLN: 2.0000 mg | INTRAVENOUS | Status: DC | PRN

## 2016-05-29 MED ORDER — LIDOCAINE HCL (CARDIAC) 20 MG/ML IV SOLN
INTRAVENOUS | Status: AC
  Filled 2016-05-29: qty 5

## 2016-05-29 MED ORDER — SODIUM CHLORIDE 0.9 % IV SOLN: Freq: Once | INTRAVENOUS | Status: DC

## 2016-05-29 MED ORDER — PROPOFOL 500 MG/50ML IV EMUL
INTRAVENOUS | Status: DC | PRN
  Administered 2016-05-29: 100 ug/kg/min via INTRAVENOUS

## 2016-05-29 MED ORDER — PROPOFOL 10 MG/ML IV BOLUS
INTRAVENOUS | Status: AC
  Filled 2016-05-29: qty 40

## 2016-05-29 MED ORDER — SODIUM CHLORIDE 0.9% FLUSH
3.0000 mL | Freq: Two times a day (BID) | INTRAVENOUS | Status: DC
  Administered 2016-05-29 – 2016-05-30 (×2): 3 mL via INTRAVENOUS

## 2016-05-29 MED ORDER — SODIUM CHLORIDE 0.9 % IV BOLUS (SEPSIS): 500.0000 mL | INTRAVENOUS | Status: DC | PRN

## 2016-05-29 MED ORDER — ONDANSETRON HCL 4 MG/2ML IJ SOLN: 4.0000 mg | Freq: Four times a day (QID) | INTRAMUSCULAR | Status: DC | PRN

## 2016-05-29 MED ORDER — HYDROCODONE-ACETAMINOPHEN 5-325 MG PO TABS: 1.0000 | ORAL_TABLET | ORAL | Status: DC | PRN

## 2016-05-29 MED ORDER — LEVOTHYROXINE SODIUM 112 MCG PO TABS: 168.0000 ug | ORAL_TABLET | ORAL | Status: DC

## 2016-05-29 MED ORDER — POLYETHYLENE GLYCOL 3350 17 G PO PACK: 17.0000 g | PACK | Freq: Every day | ORAL | Status: DC | PRN

## 2016-05-29 MED ORDER — ACETAMINOPHEN 325 MG PO TABS
650.0000 mg | ORAL_TABLET | Freq: Four times a day (QID) | ORAL | Status: DC | PRN
Start: 2016-05-29 — End: 2016-05-30
  Administered 2016-05-29: 650 mg via ORAL
  Filled 2016-05-29: qty 2

## 2016-05-29 MED ORDER — LEVOTHYROXINE SODIUM 112 MCG PO TABS
112.0000 ug | ORAL_TABLET | ORAL | Status: DC
Start: 2016-05-29 — End: 2016-05-30
  Administered 2016-05-30: 112 ug via ORAL
  Filled 2016-05-29: qty 1

## 2016-05-29 MED ORDER — BISACODYL 5 MG PO TBEC: 5.0000 mg | DELAYED_RELEASE_TABLET | Freq: Every day | ORAL | Status: DC | PRN

## 2016-05-29 MED ORDER — ACETAMINOPHEN 650 MG RE SUPP: 650.0000 mg | Freq: Four times a day (QID) | RECTAL | Status: DC | PRN

## 2016-05-29 MED ORDER — PROPOFOL 500 MG/50ML IV EMUL
INTRAVENOUS | Status: DC | PRN
  Administered 2016-05-29: 20 mg via INTRAVENOUS
  Administered 2016-05-29: 50 mg via INTRAVENOUS
  Administered 2016-05-29 (×2): 20 mg via INTRAVENOUS

## 2016-05-29 MED ORDER — FUROSEMIDE 10 MG/ML IJ SOLN
20.0000 mg | INTRAMUSCULAR | Status: AC
  Administered 2016-05-29 – 2016-05-30 (×2): 20 mg via INTRAVENOUS
  Filled 2016-05-29 (×2): qty 2

## 2016-05-29 MED ORDER — SODIUM CHLORIDE 0.9 % IV SOLN
8.0000 mg/h | INTRAVENOUS | Status: DC
  Administered 2016-05-29 – 2016-05-30 (×2): 8 mg/h via INTRAVENOUS
  Filled 2016-05-29 (×5): qty 80

## 2016-05-29 MED ORDER — ONDANSETRON HCL 4 MG PO TABS: 4.0000 mg | ORAL_TABLET | Freq: Four times a day (QID) | ORAL | Status: DC | PRN

## 2016-05-29 MED ORDER — LACTATED RINGERS IV SOLN
INTRAVENOUS | Status: DC | PRN
  Administered 2016-05-29: 11:00:00 via INTRAVENOUS

## 2016-05-29 MED ORDER — SODIUM CHLORIDE 0.9 % IV SOLN: INTRAVENOUS | Status: DC

## 2016-05-29 MED ORDER — TRAZODONE HCL 50 MG PO TABS
25.0000 mg | ORAL_TABLET | Freq: Every evening | ORAL | Status: DC | PRN
  Administered 2016-05-29: 25 mg via ORAL
  Filled 2016-05-29: qty 1

## 2016-05-29 SURGICAL SUPPLY — 14 items

## 2016-05-29 NOTE — ED Triage Notes (Signed)
Pt c/o black tarry stools that started Saturday. Pt states she had a small amount on Friday and is now having diarrhea. Pt had knee replacement surgery last Monday and was started on Xarelto. Marland Kitchen

## 2016-05-29 NOTE — Consult Note (Addendum)
Referring Provider: [facility] hospitalists Primary Care Physician:  Annye Asa, MD Primary Gastroenterologist:  Dr. Carlean Purl  Reason for Consultation:  UGIB  HPI: Kristine Olson is a 70 y.o. female with a medical history significant for, but not  limited to,  HTN, hyperlipidemia, OSA and GERD. Patient is s/p right knee replacement approx 2 weeks ago. She has been on xarelto since knee surgery. Patient has been having problems with constipation since surgery. She took miralax a few days ago and on Saturday passed some hard black stools. Patient subsequently began having more liquid black stools. She was pale and diaphoretic with bowel movements for husband. Patient's had a few episodes of nausea over the last couple days as well as they epigastric discomfort. Discomfort nonradiating,  constant and unrelated to meals. No significant NSAID use even prior to surgery. No history of peptic ulcer disease or gastrointestinal bleeding  Last xarelto yesterday AM   Having some knee pain but not too bad - has not used pain medications in 1-2 days.   ED Course:  Afebrile, vital signs stable BUN 41, creatinine 1.06 Wbc 12.2, hemoglobin 7.9,  down from 12 6 days ago.   Past Medical History:  Diagnosis Date  . Arthritis    ostearthritis - right  medial knee  . Depression   . GERD (gastroesophageal reflux disease)    "heartburn daily"-TUMS helpful  . Hx of sinus tachycardia    rarely any problems since metoprolol use  . Hyperlipidemia   . Hypertension   . Hypothyroidism   . Sleep apnea    uses c pap    Past Surgical History:  Procedure Laterality Date  . BIOPSY BREAST     2 biopsies & 2 aspirations  . CESAREAN SECTION    . COLONOSCOPY W/ POLYPECTOMY  multiple since 2007   adenomas, diverticulosis;last 2015  . PARTIAL KNEE ARTHROPLASTY Right 05/22/2016   Procedure: RIGHT KNEE MEDIAL UNICOMPARTMENTAL ARTHROPLASTY;  Surgeon: Gaynelle Arabian, MD;  Location: [facility];  Service: Orthopedics;   Laterality: Right;  . TONSILLECTOMY AND ADENOIDECTOMY    . WISDOM TOOTH EXTRACTION      Prior to Admission medications   Medication Sig Start Date End Date Taking? Authorizing Provider  acetaminophen (TYLENOL) 500 MG tablet Take 500-1,000 mg by mouth every 6 (six) hours as needed for mild pain, moderate pain, fever or headache.   Yes Historical Provider, MD  calcium carbonate (TUMS EX) 750 MG chewable tablet Chew 1 tablet by mouth daily as needed for heartburn.    Yes Historical Provider, MD  cetirizine (ZYRTEC) 10 MG tablet Take 10 mg by mouth daily as needed for allergies.   Yes Historical Provider, MD  Cholecalciferol (VITAMIN D) 2000 units tablet Take 2,000 Units by mouth daily.   Yes Historical Provider, MD  citalopram (CELEXA) 20 MG tablet Take 20 mg by mouth at bedtime.    Yes Historical Provider, MD  levothyroxine (SYNTHROID, LEVOTHROID) 112 MCG tablet TAKE 1 TABLET DAILY EXCEPT FOR 1 AND 1/2 TABLETS ON [facility] 04/18/16  Yes Midge Minium, MD  methocarbamol (ROBAXIN) 500 MG tablet Take 1 tablet (500 mg total) by mouth every 6 (six) hours as needed for muscle spasms. 05/23/16  Yes Arlee Muslim, PA-C  metoprolol tartrate (LOPRESSOR) 25 MG tablet TAKE 0.5 TABLETS (12.5 MG TOTAL) BY MOUTH 2 (TWO) TIMES DAILY. Patient taking differently: Take 25 mg by mouth at bedtime.  03/02/16  Yes Midge Minium, MD  oxyCODONE (OXY IR/ROXICODONE) 5 MG immediate release tablet Take  1-2 tablets (5-10 mg total) by mouth every 3 (three) hours as needed for breakthrough pain. 05/23/16  Yes Arlee Muslim, PA-C  rivaroxaban (XARELTO) 10 MG TABS tablet Take 1 tablet (10 mg total) by mouth daily with breakfast. Xarelto 10 mg daily for ten days, then change to Aspirin 325 mg daily for two weeks, then reduce to Baby Aspirin 81 mg daily for three additional weeks. 05/23/16  Yes Arlee Muslim, PA-C  spironolactone (ALDACTONE) 25 MG tablet Take 1 tablet (25 mg total) by mouth daily. Patient taking differently: Take 25 mg  by mouth daily with breakfast.  12/03/15  Yes Midge Minium, MD    Current Facility-Administered Medications  Medication Dose Route Frequency Provider Last Rate Last Dose  . 0.9 % NaCl with KCl 20 mEq/ L  infusion   Intravenous Continuous Thurnell Lose, MD      . acetaminophen (TYLENOL) tablet 650 mg  650 mg Oral Q6H PRN Thurnell Lose, MD      . bisacodyl (DULCOLAX) EC tablet 5 mg  5 mg Oral Daily PRN Willia Craze, NP      . citalopram (CELEXA) tablet 20 mg  20 mg Oral Daily Thurnell Lose, MD      . HYDROcodone-acetaminophen (NORCO/VICODIN) 5-325 MG per tablet 1 tablet  1 tablet Oral Q4H PRN Thurnell Lose, MD      . levothyroxine (SYNTHROID, LEVOTHROID) tablet 112 mcg  112 mcg Oral Once per day on Sun Mon Tue Thu Fri Sat Thurnell Lose, MD      . Derrill Memo ON 05/31/2016] levothyroxine (SYNTHROID, LEVOTHROID) tablet 168 mcg  168 mcg Oral Once per day on Wed Prashant K Singh, MD      . metoprolol tartrate (LOPRESSOR) tablet 25 mg  25 mg Oral Daily Thurnell Lose, MD      . morphine 2 MG/ML injection 2 mg  2 mg Intravenous Q4H PRN Thurnell Lose, MD      . ondansetron (ZOFRAN) tablet 4 mg  4 mg Oral Q6H PRN Thurnell Lose, MD       Or  . ondansetron (ZOFRAN) injection 4 mg  4 mg Intravenous Q6H PRN Thurnell Lose, MD      . pantoprazole (PROTONIX) 80 mg in sodium chloride 0.9 % 100 mL IVPB  80 mg Intravenous Once Thurnell Lose, MD      . pantoprazole (PROTONIX) 80 mg in sodium chloride 0.9 % 250 mL (0.32 mg/mL) infusion  8 mg/hr Intravenous Continuous Thurnell Lose, MD      . Derrill Memo ON 06/01/2016] pantoprazole (PROTONIX) injection 40 mg  40 mg Intravenous Q12H Thurnell Lose, MD      . polyethylene glycol (MIRALAX / GLYCOLAX) packet 17 g  17 g Oral Daily PRN Willia Craze, NP      . sodium chloride flush (NS) 0.9 % injection 3 mL  3 mL Intravenous Q12H Willia Craze, NP      . traZODone (DESYREL) tablet 25 mg  25 mg Oral QHS PRN Willia Craze, NP         Allergies as of 05/29/2016 - Review Complete 05/29/2016  Allergen Reaction Noted  . Cefaclor Rash 05/06/2009  . Penicillins  05/06/2009  . Sulfa antibiotics Rash 01/23/2013  . Advair diskus [fluticasone-salmeterol]  01/23/2013    Family History  Problem Relation Age of Onset  . Osteoporosis Mother   . Thyroid disease Mother   . Stroke Maternal Aunt  X 3   . Diabetes Maternal Aunt   . Heart attack Maternal Grandfather     > 70  . Melanoma Brother   . Multiple sclerosis Daughter 30  . Deep vein thrombosis Paternal Aunt   . Other Paternal Aunt     Phlebitis without deep venous thrombosis  . Colon cancer Neg Hx     Social History   Social History  . Marital status: Married    Social History Main Topics  . Smoking status: Never Smoker  . Smokeless tobacco: Never Used  . Alcohol use 0.0 oz/week     Comment:  very rarely  . Drug use: No  . Sexual activity: Yes     Review of Systems: Ten point ROS is O/W negative except as mentioned in HPI.  Physical Exam: Vital signs in last 24 hours: Temp:  [98.2 F (36.8 C)-98.9 F (37.2 C)] 98.9 F (37.2 C) (08/07 0745) Pulse Rate:  [70-82] 78 (08/07 0745) Resp:  [16] 16 (08/07 0745) BP: (126-134)/(67-81) 126/67 (08/07 0626) SpO2:  [100 %] 100 % (08/07 0745) Weight:  [213 lb 3 oz (96.7 kg)] 213 lb 3 oz (96.7 kg) (08/07 0745) Last BM Date: 05/29/16 General:   Alert,  Well-developed, well-nourished, pleasant and cooperative in NAD Head:  Normocephalic and atraumatic. Eyes:  Sclera clear, no icterus.   Conjunctiva pink. Ears:  Normal auditory acuity. Mouth:  No deformity or lesions.   Lungs:  Clear throughout to auscultation.  No wheezes, crackles, or rhonchi.  Heart:  Regular rate and rhythm; no murmurs, clicks, rubs,  or gallops. Abdomen:  Soft,nontender, BS active,nonpalp mass or hsm.   Msk:  Symmetrical without gross deformities. Pulses:  Normal pulses noted. Extremities:  Without clubbing or  edema. Neurologic:  Alert and oriented x 4;  grossly normal neurologically. Skin:  Intact without significant lesions or rashes. Psych:  Alert and cooperative. Normal mood and affect.    Recent Labs  05/29/16 0545  WBC 12.2*  HGB 7.9*  HCT 23.5*  PLT 221   BMET  Recent Labs  05/29/16 0545  NA 134*  K 4.4  CL 100*  CO2 26  GLUCOSE 124*  BUN 41*  CREATININE 1.06*  CALCIUM 9.7   PT/INR  Recent Labs  05/29/16 0545  LABPROT 15.0  INR 1.17   IMPRESSION:   melena and upper GI bleed while on Xarelto s/p knee replacement Acute blood loss anemia   PLAN:  Evaluate and possibly treat with EGD today The risks and benefits as well as alternatives of endoscopic procedure(s) have been discussed and reviewed. All questions answered. The patient agrees to proceed.   ZEHR, JESSICA D.  05/29/2016, 9:20 AM  Pager number [facility] Attending   I have taken an interval history, reviewed the chart and examined the patient. I agree with the Advanced Practitioner's note, impression and recommendations.   Gatha Mayer, MD, [facility] [phone] (pager) [phone] after 5 PM, weekends and holidays  05/29/2016 10:35 AM

## 2016-05-29 NOTE — Progress Notes (Signed)
   Subjective: Day of Surgery Procedure(s) (LRB): ESOPHAGOGASTRODUODENOSCOPY (EGD) WITH PROPOFOL (N/A)  1 week s/p right unilateral knee replacement Knee is doing well  Working with home exercises and pain is well maintained Began symptoms of upper GI Bleed several days ago Pt admitted and upper endoscopy this morning Doing well currently  Patient reports pain as mild.  Objective:   VITALS:   Vitals:   05/29/16 1120 05/29/16 1158  BP:  115/61  Pulse: 70 77  Resp: 19 18  Temp:      Right knee: well healed incision nv intact distally No rashes or edema distally Good rom in the bed  LABS  Recent Labs  05/29/16 0545  HGB 7.9*  HCT 23.5*  WBC 12.2*  PLT 221     Recent Labs  05/29/16 0545  NA 134*  K 4.4  BUN 41*  CREATININE 1.06*  GLUCOSE 124*     Assessment/Plan: Day of Surgery Procedure(s) (LRB): ESOPHAGOGASTRODUODENOSCOPY (EGD) WITH PROPOFOL (N/A) S/p right total knee 1 week ago Possible d/c tomorrow depending on anemia and current status with medical team Recommend PT to work on rom while she is inpatient and return to home health once discharged Knee is stable and she may f/u in the office if she is discharged tomorrow. Will monitor her progress Please alert [place] with any concerns or issues      Merla Riches, MPAS, PA-C  05/29/2016, 1:26 PM

## 2016-05-29 NOTE — Transfer of Care (Signed)
Immediate Anesthesia Transfer of Care Note  Patient: Kristine Olson  Procedure(s) Performed: Procedure(s): ESOPHAGOGASTRODUODENOSCOPY (EGD) WITH PROPOFOL (N/A)  Patient Location: PACU  Anesthesia Type:MAC  Level of Consciousness: awake, alert  and oriented  Airway & Oxygen Therapy: Patient Spontanous Breathing and Patient connected to nasal cannula oxygen  Post-op Assessment: Report given to RN and Post -op Vital signs reviewed and stable  Post vital signs: Reviewed and stable  Last Vitals:  Vitals:   05/29/16 0745 05/29/16 1020  BP:  135/67  Pulse: 78 81  Resp: 16 12  Temp: 37.2 C     Last Pain:  Vitals:   05/29/16 0745  TempSrc: Oral  PainSc:          Complications: No apparent anesthesia complications

## 2016-05-29 NOTE — Anesthesia Preprocedure Evaluation (Addendum)
Anesthesia Evaluation  Patient identified by MRN, date of birth, ID band Patient awake    Reviewed: Allergy & Precautions, NPO status , Patient's Chart, lab work & pertinent test results  Airway Mallampati: II  TM Distance: >3 FB Neck ROM: Full    Dental no notable dental hx.    Pulmonary sleep apnea and Continuous Positive Airway Pressure Ventilation ,    Pulmonary exam normal breath sounds clear to auscultation       Cardiovascular hypertension, Pt. on medications and Pt. on home beta blockers Normal cardiovascular exam Rhythm:Regular Rate:Normal     Neuro/Psych negative neurological ROS  negative psych ROS   GI/Hepatic Neg liver ROS, GERD  Medicated,  Endo/Other  Hypothyroidism   Renal/GU negative Renal ROS  negative genitourinary   Musculoskeletal negative musculoskeletal ROS (+) Arthritis ,   Abdominal   Peds negative pediatric ROS (+)  Hematology negative hematology ROS (+)   Anesthesia Other Findings   Reproductive/Obstetrics negative OB ROS                             Anesthesia Physical  Anesthesia Plan  ASA: II  Anesthesia Plan: MAC   Post-op Pain Management:    Induction: Intravenous  Airway Management Planned: Nasal Cannula  Additional Equipment: None  Intra-op Plan:   Post-operative Plan:   Informed Consent: I have reviewed the patients History and Physical, chart, labs and discussed the procedure including the risks, benefits and alternatives for the proposed anesthesia with the patient or authorized representative who has indicated his/her understanding and acceptance.   Dental advisory given  Plan Discussed with: CRNA and Surgeon  Anesthesia Plan Comments:         Anesthesia Quick Evaluation

## 2016-05-29 NOTE — H&P (Signed)
History and Physical    Kristine Olson [id number] DOB: [birth year] DOA: 05/29/2016  PCP: Annye Asa, MD  Patient coming from:   Home    Chief Complaint: black stools  HPI: Kristine Olson is a 70 y.o. female with a medical history significant for, but not  limited to,  HTN, hyperlipidemia, OSA and GERD. Patient is s/p right knee replacement approx 2 weeks ago. She has been on xarelto since knee surgery. Patient has been having problems with constipation since surgery. She took miralax a few days ago and on Saturday passed some hard black stools. Patient subsequently began having more liquid black stools. She was pale and diaphoretic with bowel movements for husband. Patient's had a few episodes of nausea over the last couple days as well as they epigastric discomfort. Discomfort nonradiating,  constant and unrelated to meals. No significant NSAID use even prior to surgery. No history of peptic ulcer disease or gastrointestinal bleeding  ED Course:  Afebrile, vital signs stable BUN 41, creatinine 1.06 Wbc 12.2, hemoglobin 7.9,  down from 12 6 days ago.  Review of Systems:  Reflux symptoms . As per HPI, otherwise 10 point review of systems negative.    Past Medical History:  Diagnosis Date  . Arthritis    ostearthritis - right  medial knee  . Depression   . GERD (gastroesophageal reflux disease)    "heartburn daily"-TUMS helpful  . Hx of sinus tachycardia    rarely any problems since metoprolol use  . Hyperlipidemia   . Hypertension   . Hypothyroidism   . Sleep apnea    uses c pap    Past Surgical History:  Procedure Laterality Date  . BIOPSY BREAST     2 biopsies & 2 aspirations  . CESAREAN SECTION    . COLONOSCOPY W/ POLYPECTOMY  multiple since 2007   adenomas, diverticulosis;last 2015  . PARTIAL KNEE ARTHROPLASTY Right 05/22/2016   Procedure: RIGHT KNEE MEDIAL UNICOMPARTMENTAL ARTHROPLASTY;  Surgeon: Gaynelle Arabian, MD;  Location: [facility];  Service:  Orthopedics;  Laterality: Right;  . TONSILLECTOMY AND ADENOIDECTOMY    . WISDOM TOOTH EXTRACTION      Social History   Social History  . Marital status: Married    Spouse name: N/A  . Number of children: N/A  . Years of education: N/A   Occupational History  . Not on file.   Social History Main Topics  . Smoking status: Never Smoker  . Smokeless tobacco: Never Used  . Alcohol use 0.0 oz/week     Comment:  very rarely  . Drug use: No  . Sexual activity: Yes   Other Topics Concern  . Not on file   Social History Narrative  . No narrative on file  Married, lives at home with husband. Currently using a walker for ambulation post knee surgery.  Allergies  Allergen Reactions  . Cefaclor     REACTION: rash Because of a history of documented adverse serious drug reaction;Medi Alert bracelet  is recommended  . Penicillins     REACTION: rash Because of a history of documented adverse serious drug reaction;Medi Alert bracelet  is recommended Has patient had a PCN reaction causing immediate rash, facial/tongue/throat swelling, SOB or lightheadedness with hypotension: no Has patient had a PCN reaction causing severe rash involving mucus membranes or skin necrosis: {no Has patient had a PCN reaction that required hospitalization no Has patient had a PCN reaction occurring within the last 10 years: no If all of the above  ans  . Sulfa Antibiotics     01/22/13 after 9 days of Sulfa Because of a history of documented adverse serious drug reaction;Medi Alert bracelet  is recommended  . Advair Diskus [Fluticasone-Salmeterol]     "Felt weird" and flushing.    Family History  Problem Relation Age of Onset  . Osteoporosis Mother   . Thyroid disease Mother   . Stroke Maternal Aunt      X 3   . Diabetes Maternal Aunt   . Heart attack Maternal Grandfather     > 70  . Melanoma Brother   . Multiple sclerosis Daughter 81  . Deep vein thrombosis Paternal Aunt   . Other Paternal Aunt       Phlebitis without deep venous thrombosis  . Colon cancer Neg Hx    Prior to Admission medications   Medication Sig Start Date End Date Taking? Authorizing Provider  calcium carbonate (TUMS EX) 750 MG chewable tablet Chew 1 tablet by mouth daily as needed for heartburn.     Historical Provider, MD  cetirizine (ZYRTEC) 10 MG tablet Take 10 mg by mouth daily as needed for allergies.    Historical Provider, MD  citalopram (CELEXA) 20 MG tablet Take 20 mg by mouth. 1  by mouth daily    Historical Provider, MD  levothyroxine (SYNTHROID, LEVOTHROID) 112 MCG tablet TAKE 1 TABLET DAILY EXCEPT FOR 1 AND 1/2 TABLETS ON [facility] 04/18/16   Midge Minium, MD  methocarbamol (ROBAXIN) 500 MG tablet Take 1 tablet (500 mg total) by mouth every 6 (six) hours as needed for muscle spasms. 05/23/16   Arlee Muslim, PA-C  metoprolol tartrate (LOPRESSOR) 25 MG tablet TAKE 0.5 TABLETS (12.5 MG TOTAL) BY MOUTH 2 (TWO) TIMES DAILY. Patient taking differently: Take 25 mg by mouth daily.  03/02/16   Midge Minium, MD  oxyCODONE (OXY IR/ROXICODONE) 5 MG immediate release tablet Take 1-2 tablets (5-10 mg total) by mouth every 3 (three) hours as needed for breakthrough pain. 05/23/16   Arlee Muslim, PA-C  rivaroxaban (XARELTO) 10 MG TABS tablet Take 1 tablet (10 mg total) by mouth daily with breakfast. Xarelto 10 mg daily for ten days, then change to Aspirin 325 mg daily for two weeks, then reduce to Baby Aspirin 81 mg daily for three additional weeks. 05/23/16   Arlee Muslim, PA-C  spironolactone (ALDACTONE) 25 MG tablet Take 1 tablet (25 mg total) by mouth daily. 12/03/15   Midge Minium, MD    Physical Exam: Vitals:   05/29/16 0516 05/29/16 0626  BP: 134/81 126/67  Pulse: 82 70  Resp: 16 16  Temp: 98.2 F (36.8 C)   TempSrc: Oral   SpO2: 100% 100%    Constitutional:  Pleasant, well-developed white female in NAD, calm, comfortable Vitals:   05/29/16 0516 05/29/16 0626  BP: 134/81 126/67  Pulse: 82 70   Resp: 16 16  Temp: 98.2 F (36.8 C)   TempSrc: Oral   SpO2: 100% 100%   Eyes: PER, lids and conjunctivae normal ENMT: Mucous membranes are moist. Posterior pharynx clear of any exudate or lesions..  Neck: normal, supple, no masses Respiratory: clear to auscultation bilaterally, no wheezing, no crackles. Normal respiratory effort. No accessory muscle use.  Cardiovascular: Regular rate and rhythm, no murmurs / rubs / gallops. No extremity edema. 2+ pedal pulses.   Abdomen: no tenderness, no masses palpated. No hepatomegaly. Bowel sounds positive.  Musculoskeletal: no clubbing / cyanosis. No joint deformity upper and lower extremities. Surgical dressing over  right knee . Good ROM, no contractures. Normal muscle tone.  Skin: no rashes, lesions, ulcers.  Neurologic: CN 2-12 grossly intact. Sensation intact, Strength 5/5 in all 4.  Psychiatric: Normal judgment and insight. Alert and oriented x 3. Normal mood.   Labs on Admission: I have personally reviewed following labs and imaging studies  Radiological Exams on Admission: No results found.   Assessment/Plan    Upper gastrointestinal bleeding on Xarelto with black stools, elevated BUN.            -Place in observation -telemetry -[facility] has already been consulted -PPI gtt -NPO, IV fluids at 75 cc an hour -Xarelto on hold, last dose yesterday morning around 10 AM     Anemia of acute blood loss. Hemoglobin 7.9 down from 12.0 six days ago . -Discussed risk and benefits of blood transfusion. Patient hopes that transfusion is last resort -Patient has been typed and screened -Monitor H&H, transfuse if hemoglobin drops below 7.  -If iron prescribed discharge, patient will need bowel  regimen as she is re: struggling with constipation  Osteoarthritis, recent right knee surgery.  -Called orthopedics who will come see the patient since Xarelto needs to be held  Hypothyroidism.  -Continue home Synthroid when taking by  mouth  Hypertension,. Blood pressure controlled at present -Resume  Obstructive sleep apnea.    -Husband to bring in CPAP machine from home  Depression, stable -Continue home Celexa when taking by mouth  GERD, takes tums at home for heartburn. On PPI, -Needs PPI or H2 blocker at time of discharge regardless of EGD findings  History of sinus tachycardia, takes metoprolol at night. Heart rate currently normal at 82.  Resume home metoprolol when taking by mouth  DVT prophylaxis:   SCDs   Code Status:     Full code   Family Communication:    Treatment plan discussed with  jusband in the room and he understands and agrees with the plan..  Disposition Plan:   Discharge home in  24-48 hours        Consults called:   [facility]  Orthopedics - Dr.Norris to see  Admission status:   Admit to Telemetry    Tye Savoy NP [facility] Pager [phone]  If 7PM-7AM, please contact night-coverage [email or web address] Password [email or web address]  05/29/2016, 8:04 AM

## 2016-05-29 NOTE — Consult Note (Signed)
Reason for Consult:Recent unicompartmental knee replacement surgery in the setting of an acute GI bleed Referring Physician: Candiss Norse MD  Kristine Olson is an 70 y.o. female.  HPI: 70 yo female patient of Dr Hector Shade who is one week s/p partial knee replacement who has been at home doing well on routine Xarelto DVT prophylaxis when she noted melena and presented for evaluation.  Patient was admitted for work-up and treatment of an upper GI bleed.  The patient does report a past history of reflux since college.  Follow her knee surgery she reports doing well with her exercises and walking.  Past Medical History:  Diagnosis Date  . Arthritis    ostearthritis - right  medial knee  . Depression   . GERD (gastroesophageal reflux disease)    "heartburn daily"-TUMS helpful  . Hx of sinus tachycardia    rarely any problems since metoprolol use  . Hyperlipidemia   . Hypertension   . Hypothyroidism   . Sleep apnea    uses c pap    Past Surgical History:  Procedure Laterality Date  . BIOPSY BREAST     2 biopsies & 2 aspirations  . CESAREAN SECTION    . COLONOSCOPY W/ POLYPECTOMY  multiple since 2007   adenomas, diverticulosis;last 2015  . PARTIAL KNEE ARTHROPLASTY Right 05/22/2016   Procedure: RIGHT KNEE MEDIAL UNICOMPARTMENTAL ARTHROPLASTY;  Surgeon: Gaynelle Arabian, MD;  Location: [facility];  Service: Orthopedics;  Laterality: Right;  . TONSILLECTOMY AND ADENOIDECTOMY    . WISDOM TOOTH EXTRACTION      Family History  Problem Relation Age of Onset  . Osteoporosis Mother   . Thyroid disease Mother   . Stroke Maternal Aunt      X 3   . Diabetes Maternal Aunt   . Heart attack Maternal Grandfather     > 70  . Melanoma Brother   . Multiple sclerosis Daughter 62  . Deep vein thrombosis Paternal Aunt   . Other Paternal Aunt     Phlebitis without deep venous thrombosis  . Colon cancer Neg Hx     Social History:  reports that she has never smoked. She has never used smokeless tobacco.  She reports that she drinks alcohol. She reports that she does not use drugs.  Allergies:  Allergies  Allergen Reactions  . Cefaclor Rash    Because of a history of documented adverse serious drug reaction;Medi Alert bracelet  is recommended  . Penicillins     REACTION: rash Because of a history of documented adverse serious drug reaction;Medi Alert bracelet  is recommended Has patient had a PCN reaction causing immediate rash, facial/tongue/throat swelling, SOB or lightheadedness with hypotension: no Has patient had a PCN reaction causing severe rash involving mucus membranes or skin necrosis: {no Has patient had a PCN reaction that required hospitalization no Has patient had a PCN reaction occurring within the last 10 years: no If all of the above ans  . Sulfa Antibiotics Rash    01/22/13 after 9 days of Sulfa Because of a history of documented adverse serious drug reaction;Medi Alert bracelet  is recommended  . Advair Diskus [Fluticasone-Salmeterol]     "Felt weird" and flushing.    Medications: I have reviewed the patient's current medications.  Results for orders placed or performed during the hospital encounter of 05/29/16 (from the past 48 hour(s))  CBC with Differential/Platelet     Status: Abnormal   Collection Time: 05/29/16  5:45 AM  Result Value Ref Range  WBC 12.2 (H) 4.0 - 10.5 K/uL   RBC 2.70 (L) 3.87 - 5.11 MIL/uL   Hemoglobin 7.9 (L) 12.0 - 15.0 g/dL   HCT 23.5 (L) 36.0 - 46.0 %   MCV 87.0 78.0 - 100.0 fL   MCH 29.3 26.0 - 34.0 pg   MCHC 33.6 30.0 - 36.0 g/dL   RDW 14.3 11.5 - 15.5 %   Platelets 221 150 - 400 K/uL   Neutrophils Relative % 79 %   Neutro Abs 9.7 (H) 1.7 - 7.7 K/uL   Lymphocytes Relative 15 %   Lymphs Abs 1.8 0.7 - 4.0 K/uL   Monocytes Relative 5 %   Monocytes Absolute 0.6 0.1 - 1.0 K/uL   Eosinophils Relative 1 %   Eosinophils Absolute 0.1 0.0 - 0.7 K/uL   Basophils Relative 0 %   Basophils Absolute 0.0 0.0 - 0.1 K/uL  Basic metabolic  panel     Status: Abnormal   Collection Time: 05/29/16  5:45 AM  Result Value Ref Range   Sodium 134 (L) 135 - 145 mmol/L   Potassium 4.4 3.5 - 5.1 mmol/L   Chloride 100 (L) 101 - 111 mmol/L   CO2 26 22 - 32 mmol/L   Glucose, Bld 124 (H) 65 - 99 mg/dL   BUN 41 (H) 6 - 20 mg/dL   Creatinine, Ser 1.06 (H) 0.44 - 1.00 mg/dL   Calcium 9.7 8.9 - 10.3 mg/dL   GFR calc non Af Amer 53 (L) >60 mL/min   GFR calc Af Amer >60 >60 mL/min    Comment: (NOTE) The eGFR has been calculated using the CKD EPI equation. This calculation has not been validated in all clinical situations. eGFR's persistently <60 mL/min signify possible Chronic Kidney Disease.    Anion gap 8 5 - 15  Magnesium     Status: None   Collection Time: 05/29/16  5:45 AM  Result Value Ref Range   Magnesium 1.9 1.7 - 2.4 mg/dL  Protime-INR     Status: None   Collection Time: 05/29/16  5:45 AM  Result Value Ref Range   Prothrombin Time 15.0 11.4 - 15.2 seconds   INR 1.17   POC occult blood, ED RN will collect     Status: Abnormal   Collection Time: 05/29/16  5:55 AM  Result Value Ref Range   Fecal Occult Bld POSITIVE (A) NEGATIVE  Type and screen     Status: None   Collection Time: 05/29/16  6:20 AM  Result Value Ref Range   ABO/RH(D) A POS    Antibody Screen NEG    Sample Expiration 06/01/2016   Vitamin B12     Status: Abnormal   Collection Time: 05/29/16  2:26 PM  Result Value Ref Range   Vitamin B-12 136 (L) 180 - 914 pg/mL    Comment: (NOTE) This assay is not validated for testing neonatal or myeloproliferative syndrome specimens for Vitamin B12 levels. Performed at [facility]   Folate     Status: None   Collection Time: 05/29/16  2:26 PM  Result Value Ref Range   Folate 14.9 >5.9 ng/mL    Comment: Performed at [facility]  Iron and TIBC     Status: Abnormal   Collection Time: 05/29/16  2:26 PM  Result Value Ref Range   Iron 27 (L) 28 - 170 ug/dL   TIBC 378 250 - 450 ug/dL   Saturation  Ratios 7 (L) 10.4 - 31.8 %   UIBC 351 ug/dL  Comment: Performed at [facility]  Ferritin     Status: None   Collection Time: 05/29/16  2:26 PM  Result Value Ref Range   Ferritin 24 11 - 307 ng/mL    Comment: Performed at [facility]  Reticulocytes     Status: Abnormal   Collection Time: 05/29/16  2:26 PM  Result Value Ref Range   Retic Ct Pct 4.5 (H) 0.4 - 3.1 %   RBC. 2.35 (L) 3.87 - 5.11 MIL/uL   Retic Count, Manual 105.8 19.0 - 186.0 K/uL  Hemoglobin and hematocrit, blood     Status: Abnormal   Collection Time: 05/29/16  2:26 PM  Result Value Ref Range   Hemoglobin 6.8 (LL) 12.0 - 15.0 g/dL    Comment: RESULT REPEATED AND VERIFIED CRITICAL RESULT CALLED TO, READ BACK BY AND VERIFIED WITH: J.DEUTSCH RN AT 1452 ON 05/29/16 BY S.VANHOORNE    HCT 20.4 (L) 36.0 - 46.0 %  Protime-INR     Status: None   Collection Time: 05/29/16  2:26 PM  Result Value Ref Range   Prothrombin Time 14.2 11.4 - 15.2 seconds   INR 1.09     No results found.  ROS Blood pressure 115/62, pulse 96, temperature 97.5 F (36.4 C), temperature source Oral, resp. rate 16, height 5' 4" (1.626 m), weight 96.7 kg (213 lb 3 oz), SpO2 100 %. Physical Exam  Healthy appearing female in NAD.  Right knee incision clean and dry with no erythema and no drainage.  There are no cords and neg Homan's.   AROM 0-90 degrees, ankle pumps without pain.  Assessment/Plan: Acute GI bleed on Xarelto s/p knee arthroplasty.  Patient noted to have an ulcer on endoscopy today.  From an orthopedic standpoint will recommend mechanical DVT prophylaxis and mobilization.  Continue therapy while in house and outpatient. Follow up with Dr Maureen Ralphs as scheduled.  NORRIS,STEVEN R 05/29/2016, 7:02 PM

## 2016-05-29 NOTE — Progress Notes (Signed)
CRITICAL VALUE ALERT  Critical value received:  Hgb 6.8  Date of notification:  05/29/2016   Time of notification:  2:56 PM   Critical value read back:Yes.    Nurse who received alert:  Kaylyn Layer   MD notified (1st page):  Dr. Candiss Norse  Time of first page:  2:57 PM   MD notified (2nd page):  Time of second page:  Responding MD:  No call back received  Time MD responded:   Pt reports that she has had about 12 or more vials of blood drawn today. She had 5 vials drawn in the ED, but there were labeled incorrectly and so the lab had to come and draw about 7 more vials this afternoon. Pt has not had any BMs since admission to the unit at 8am today.

## 2016-05-29 NOTE — Op Note (Signed)
[facility] Patient Name: Kristine Olson Procedure Date: 05/29/2016 MRN: [id number] Attending MD: Gatha Mayer , MD Date of Birth: [birth year] CSN: [id number] Age: 70 Admit Type: Inpatient Procedure:                Upper GI endoscopy Indications:              Melena, Suspected upper gastrointestinal bleeding Providers:                Gatha Mayer, MD, Vista Lawman, RN, Cletis Athens,                            Technician Referring MD:              Medicines:                Propofol per Anesthesia, Monitored Anesthesia Care Complications:            No immediate complications. Estimated Blood Loss:     Estimated blood loss was minimal. Procedure:                Pre-Anesthesia Assessment:                           - Prior to the procedure, a History and Physical                            was performed, and patient medications and                            allergies were reviewed. The patient's tolerance of                            previous anesthesia was also reviewed. The risks                            and benefits of the procedure and the sedation                            options and risks were discussed with the patient.                            All questions were answered, and informed consent                            was obtained. Prior Anticoagulants: The patient                            last took Xarelto (rivaroxaban) 1 day prior to the                            procedure. ASA Grade Assessment: II - A patient                            with mild systemic disease. After reviewing the  risks and benefits, the patient was deemed in                            satisfactory condition to undergo the procedure.                           After obtaining informed consent, the endoscope was                            passed under direct vision. Throughout the                            procedure, the patient's blood pressure,  pulse, and                            oxygen saturations were monitored continuously. The                            [id number]) scope was introduced through the                            mouth, and advanced to the second part of duodenum.                            The upper GI endoscopy was accomplished without                            difficulty. The patient tolerated the procedure                            fairly well. Scope In: Scope Out: Findings:      One oozing gastric ulcer with no stigmata of bleeding was found at the       pylorus. For hemostasis, one hemostatic clip was successfully placed (MR       conditional). First clip did not deploy. There was no bleeding at the       end of the procedure. Estimated blood loss was minimal.      A small hiatal hernia was present.      The exam was otherwise without abnormality.      The cardia and gastric fundus were normal on retroflexion. Impression:               - Oozing (intermittent) gastric ulcer with no                            stigmata of bleeding. Clip (MR conditional) was                            placed.                           - Small hiatal hernia.                           - The examination was otherwise normal.                           -  No specimens collected. Moderate Sedation:      Please see anesthesia notes, moderate sedation not given Recommendation:           - Patient has a contact number available for                            emergencies. The signs and symptoms of potential                            delayed complications were discussed with the                            patient. Return to normal activities tomorrow.                            Written discharge instructions were provided to the                            patient.                           - Return patient to hospital ward for ongoing care.                           - Soft diet.                           - Continue PPI gtt  for now                           suspect this small ulcer in setting of Xarelto                            caused her bleeding                           Anticipate transition to oral PPI tomorrow                           Avoid anti-coag agents for now                           will check H pylori serology - if negative can                            trust if + will decide if tx makes sense as many                            false +                           - Continue present medications. Procedure Code(s):        --- Professional ---                           [id number], Esophagogastroduodenoscopy, flexible,  transoral; with control of bleeding, any method Diagnosis Code(s):        --- Professional ---                           K25.4, Chronic or unspecified gastric ulcer with                            hemorrhage                           K44.9, Diaphragmatic hernia without obstruction or                            gangrene                           K92.1, Melena (includes Hematochezia) CPT copyright 2016 [employer]. All rights reserved. The codes documented in this report are preliminary and upon coder review may  be revised to meet current compliance requirements. Gatha Mayer, MD 05/29/2016 11:13:44 AM This report has been signed electronically. Number of Addenda: 0

## 2016-05-29 NOTE — ED Provider Notes (Signed)
[facility] DEPT Provider Note   CSN: [id number] Arrival date & time: 05/29/16  [id number]  First Provider Contact:  None       History   Chief Complaint Chief Complaint  Patient presents with  . Diarrhea    HPI CANDLE LATTERELL is a 70 y.o. female with a past medical history of recent partial knee replacement, placed on Xarelto, presenting today with blacks stools, diarrhea. Patient states his surgery was approximately one week ago. She was placed on Xarelto since. For the last 3 days she was taken off of her pain medication and continued Maalox for constipation. She then began experiencing diarrhea with black stools. Husband reports while she is on the toilet she becomes incredibly pale and diaphoretic. Patient denies any shortness of breath at this time. She states her energy levels have been normal for her. There are no further complaints. She states her knee pain and swelling have improved daily since the day of surgery. She has no complaints about her knee.  10 Systems reviewed and are negative for acute change except as noted in the HPI.    HPI  Past Medical History:  Diagnosis Date  . Arthritis    ostearthritis - right  medial knee  . Depression   . GERD (gastroesophageal reflux disease)    "heartburn daily"-TUMS helpful  . Hx of sinus tachycardia    rarely any problems since metoprolol use  . Hyperlipidemia   . Hypertension   . Hypothyroidism   . Sleep apnea    uses c pap    Patient Active Problem List   Diagnosis Date Noted  . OA (osteoarthritis) of knee 05/22/2016  . Physical exam 04/17/2016  . Hemorrhoids 12/03/2015  . Elevated serum creatinine 11/27/2014  . OSA (obstructive sleep apnea) 03/28/2013  . HTN (hypertension) 03/06/2013  . Positional vertigo 10/18/2012  . Fasting hyperglycemia 08/23/2012  . Vitamin D deficiency 08/23/2012  . Hyperlipidemia 07/06/2011  . Osteopenia 07/06/2011  . POSTMENOPAUSAL SYNDROME 03/01/2010  . ESOPHAGEAL REFLUX  03/05/2009  . Hypothyroidism 11/20/2007  . IBS 11/20/2007  . TACHYCARDIA, HX OF 11/20/2007  . History of colonic polyps 11/20/2007    Past Surgical History:  Procedure Laterality Date  . BIOPSY BREAST     2 biopsies & 2 aspirations  . CESAREAN SECTION    . COLONOSCOPY W/ POLYPECTOMY  multiple since 2007   adenomas, diverticulosis;last 2015  . PARTIAL KNEE ARTHROPLASTY Right 05/22/2016   Procedure: RIGHT KNEE MEDIAL UNICOMPARTMENTAL ARTHROPLASTY;  Surgeon: Gaynelle Arabian, MD;  Location: [facility];  Service: Orthopedics;  Laterality: Right;  . TONSILLECTOMY AND ADENOIDECTOMY    . WISDOM TOOTH EXTRACTION      OB History    No data available       Home Medications    Prior to Admission medications   Medication Sig Start Date End Date Taking? Authorizing Provider  calcium carbonate (TUMS EX) 750 MG chewable tablet Chew 1 tablet by mouth daily as needed for heartburn.     Historical Provider, MD  cetirizine (ZYRTEC) 10 MG tablet Take 10 mg by mouth daily as needed for allergies.    Historical Provider, MD  citalopram (CELEXA) 20 MG tablet Take 20 mg by mouth. 1  by mouth daily    Historical Provider, MD  levothyroxine (SYNTHROID, LEVOTHROID) 112 MCG tablet TAKE 1 TABLET DAILY EXCEPT FOR 1 AND 1/2 TABLETS ON [facility] 04/18/16   Midge Minium, MD  methocarbamol (ROBAXIN) 500 MG tablet Take 1 tablet (500 mg total) by  mouth every 6 (six) hours as needed for muscle spasms. 05/23/16   Arlee Muslim, PA-C  metoprolol tartrate (LOPRESSOR) 25 MG tablet TAKE 0.5 TABLETS (12.5 MG TOTAL) BY MOUTH 2 (TWO) TIMES DAILY. Patient taking differently: Take 25 mg by mouth daily.  03/02/16   Midge Minium, MD  oxyCODONE (OXY IR/ROXICODONE) 5 MG immediate release tablet Take 1-2 tablets (5-10 mg total) by mouth every 3 (three) hours as needed for breakthrough pain. 05/23/16   Arlee Muslim, PA-C  rivaroxaban (XARELTO) 10 MG TABS tablet Take 1 tablet (10 mg total) by mouth daily with breakfast. Xarelto 10 mg  daily for ten days, then change to Aspirin 325 mg daily for two weeks, then reduce to Baby Aspirin 81 mg daily for three additional weeks. 05/23/16   Arlee Muslim, PA-C  spironolactone (ALDACTONE) 25 MG tablet Take 1 tablet (25 mg total) by mouth daily. 12/03/15   Midge Minium, MD    Family History Family History  Problem Relation Age of Onset  . Osteoporosis Mother   . Thyroid disease Mother   . Stroke Maternal Aunt      X 3   . Diabetes Maternal Aunt   . Heart attack Maternal Grandfather     > 70  . Melanoma Brother   . Multiple sclerosis Daughter 49  . Deep vein thrombosis Paternal Aunt   . Other Paternal Aunt     Phlebitis without deep venous thrombosis  . Colon cancer Neg Hx     Social History Social History  Substance Use Topics  . Smoking status: Never Smoker  . Smokeless tobacco: Never Used  . Alcohol use 0.0 oz/week     Comment:  very rarely     Allergies   Cefaclor; Penicillins; Sulfa antibiotics; and Advair diskus [fluticasone-salmeterol]   Review of Systems Review of Systems   Physical Exam Updated Vital Signs BP 134/81 (BP Location: Right Arm)   Pulse 82   Temp 98.2 F (36.8 C) (Oral)   Resp 16   SpO2 100%   Physical Exam  Constitutional: She is oriented to person, place, and time. She appears well-developed and well-nourished. No distress.  HENT:  Head: Normocephalic and atraumatic.  Nose: Nose normal.  Mouth/Throat: Oropharynx is clear and moist. No oropharyngeal exudate.  Eyes: Conjunctivae and EOM are normal. Pupils are equal, round, and reactive to light. No scleral icterus.  Neck: Normal range of motion. Neck supple. No JVD present. No tracheal deviation present. No thyromegaly present.  Cardiovascular: Normal rate, regular rhythm and normal heart sounds.  Exam reveals no gallop and no friction rub.   No murmur heard. Pulmonary/Chest: Effort normal and breath sounds normal. No respiratory distress. She has no wheezes. She exhibits no  tenderness.  Abdominal: Soft. Bowel sounds are normal. She exhibits no distension and no mass. There is no tenderness. There is no rebound and no guarding.  Genitourinary: Rectal exam shows guaiac positive stool.  Musculoskeletal: Normal range of motion. She exhibits no edema or tenderness.  Right knee has appropriate surgical changes. There is diffuse swelling of the right knee with bruising in the popliteal fossa. There is a sterile dressing in place, this was not removed. There is mild warmth as well.  Lymphadenopathy:    She has no cervical adenopathy.  Neurological: She is alert and oriented to person, place, and time. No cranial nerve deficit. She exhibits normal muscle tone.  Skin: Skin is warm and dry. No rash noted. No erythema. No pallor.  Nursing note  and vitals reviewed.    ED Treatments / Results  Labs (all labs ordered are listed, but only abnormal results are displayed) Labs Reviewed  CBC WITH DIFFERENTIAL/PLATELET - Abnormal; Notable for the following:       Result Value   WBC 12.2 (*)    RBC 2.70 (*)    Hemoglobin 7.9 (*)    HCT 23.5 (*)    Neutro Abs 9.7 (*)    All other components within normal limits  BASIC METABOLIC PANEL - Abnormal; Notable for the following:    Sodium 134 (*)    Chloride 100 (*)    Glucose, Bld 124 (*)    BUN 41 (*)    Creatinine, Ser 1.06 (*)    GFR calc non Af Amer 53 (*)    All other components within normal limits  POC OCCULT BLOOD, ED - Abnormal; Notable for the following:    Fecal Occult Bld POSITIVE (*)    All other components within normal limits  MAGNESIUM  PROTIME-INR  TYPE AND SCREEN    EKG  EKG Interpretation None       Radiology No results found.  Procedures Procedures (including critical care time)  Medications Ordered in ED Medications - No data to display   Initial Impression / Assessment and Plan / ED Course  I have reviewed the triage vital signs and the nursing notes.  Pertinent labs & imaging  results that were available during my care of the patient were reviewed by me and considered in my medical decision making (see chart for details).  Clinical Course    Patient presents emergency department for black tarry stools and diarrhea. Hemoccult was positive. This is likely from the [place]. Will hold this medication. Patient had a severe hemoglobin drop from her last blood draw 12.0 to today at 7.9. She will require admission for further care and to discuss anticoagulation. She demonstrates good understanding. Type and screen has been sent. We'll place a hospitalist for admission.  Final Clinical Impressions(s) / ED Diagnoses   Final diagnoses:  None    New Prescriptions New Prescriptions   No medications on file     Everlene Balls, MD 05/29/16 [phone]

## 2016-05-29 NOTE — Anesthesia Postprocedure Evaluation (Signed)
Anesthesia Post Note  Patient: Kristine Olson  Procedure(s) Performed: Procedure(s) (LRB): ESOPHAGOGASTRODUODENOSCOPY (EGD) WITH PROPOFOL (N/A)  Patient location during evaluation: PACU Anesthesia Type: MAC Level of consciousness: awake and alert Pain management: pain level controlled Vital Signs Assessment: post-procedure vital signs reviewed and stable Respiratory status: spontaneous breathing, nonlabored ventilation, respiratory function stable and patient connected to nasal cannula oxygen Cardiovascular status: stable and blood pressure returned to baseline Anesthetic complications: no    Last Vitals:  Vitals:   05/29/16 1120 05/29/16 1158  BP:  115/61  Pulse: 70 77  Resp: 19 18  Temp:      Last Pain:  Vitals:   05/29/16 1120  TempSrc: Oral  PainSc:                  Reginal Lutes

## 2016-05-29 NOTE — Progress Notes (Signed)
Paged at 6.15pm - Pt dizzy, HB 6.8, will give 2 units PRBCs now and monitor, EGD note reviewed. NS bolus PRN,  SBP stable, mild tachycardia.

## 2016-05-29 NOTE — Progress Notes (Signed)
Called lab to check on blood products at 1959 and they were still not ready. Informed lab that we need first unit STAT. IV team has been paged for a second IV site, but will start blood as soon as it is ready.

## 2016-05-30 ENCOUNTER — Encounter (HOSPITAL_COMMUNITY): Payer: Self-pay | Admitting: Internal Medicine

## 2016-05-30 ENCOUNTER — Other Ambulatory Visit: Payer: Self-pay

## 2016-05-30 DIAGNOSIS — E038 Other specified hypothyroidism: Secondary | ICD-10-CM

## 2016-05-30 DIAGNOSIS — K253 Acute gastric ulcer without hemorrhage or perforation: Secondary | ICD-10-CM

## 2016-05-30 DIAGNOSIS — K274 Chronic or unspecified peptic ulcer, site unspecified, with hemorrhage: Secondary | ICD-10-CM

## 2016-05-30 DIAGNOSIS — K219 Gastro-esophageal reflux disease without esophagitis: Secondary | ICD-10-CM

## 2016-05-30 DIAGNOSIS — K254 Chronic or unspecified gastric ulcer with hemorrhage: Secondary | ICD-10-CM

## 2016-05-30 DIAGNOSIS — G4733 Obstructive sleep apnea (adult) (pediatric): Secondary | ICD-10-CM

## 2016-05-30 DIAGNOSIS — K922 Gastrointestinal hemorrhage, unspecified: Secondary | ICD-10-CM

## 2016-05-30 DIAGNOSIS — I10 Essential (primary) hypertension: Secondary | ICD-10-CM

## 2016-05-30 LAB — CBC
HEMATOCRIT: 29.3 % — AB (ref 36.0–46.0)
Hemoglobin: 9.7 g/dL — ABNORMAL LOW (ref 12.0–15.0)
MCH: 29 pg (ref 26.0–34.0)
MCHC: 33.1 g/dL (ref 30.0–36.0)
MCV: 87.7 fL (ref 78.0–100.0)
PLATELETS: 187 10*3/uL (ref 150–400)
RBC: 3.34 MIL/uL — ABNORMAL LOW (ref 3.87–5.11)
RDW: 14.1 % (ref 11.5–15.5)
WBC: 8.9 10*3/uL (ref 4.0–10.5)

## 2016-05-30 LAB — TYPE AND SCREEN
ABO/RH(D): A POS
ANTIBODY SCREEN: NEGATIVE
UNIT DIVISION: 0
Unit division: 0

## 2016-05-30 LAB — BASIC METABOLIC PANEL
ANION GAP: 8 (ref 5–15)
BUN: 23 mg/dL — ABNORMAL HIGH (ref 6–20)
CALCIUM: 8.6 mg/dL — AB (ref 8.9–10.3)
CHLORIDE: 104 mmol/L (ref 101–111)
CO2: 28 mmol/L (ref 22–32)
Creatinine, Ser: 1.34 mg/dL — ABNORMAL HIGH (ref 0.44–1.00)
GFR calc non Af Amer: 40 mL/min — ABNORMAL LOW (ref >60)
GFR, EST AFRICAN AMERICAN: 46 mL/min — AB (ref >60)
Glucose, Bld: 111 mg/dL — ABNORMAL HIGH (ref 65–99)
POTASSIUM: 3.7 mmol/L (ref 3.5–5.1)
Sodium: 140 mmol/L (ref 135–145)

## 2016-05-30 MED ORDER — PANTOPRAZOLE SODIUM 40 MG PO TBEC
40.0000 mg | DELAYED_RELEASE_TABLET | Freq: Two times a day (BID) | ORAL | Status: DC
  Administered 2016-05-30: 40 mg via ORAL
  Filled 2016-05-30: qty 1

## 2016-05-30 MED ORDER — PANTOPRAZOLE SODIUM 40 MG PO TBEC: 40.0000 mg | DELAYED_RELEASE_TABLET | Freq: Every day | ORAL | 0 refills | Status: DC

## 2016-05-30 MED ORDER — PANTOPRAZOLE SODIUM 40 MG PO TBEC: 40.0000 mg | DELAYED_RELEASE_TABLET | Freq: Every day | ORAL | Status: DC

## 2016-05-30 MED ORDER — PANTOPRAZOLE SODIUM 40 MG PO TBEC: 40.0000 mg | DELAYED_RELEASE_TABLET | Freq: Two times a day (BID) | ORAL | 0 refills | Status: DC

## 2016-05-30 NOTE — Discharge Summary (Signed)
Physician Discharge Summary  Kristine Olson [id number] DOB: Apr 22, 1946 DOA: 05/29/2016  PCP: Annye Asa, MD  Admit date: 05/29/2016 Discharge date: 05/30/2016   Admitted From: Home Disposition: Home with HHPT  Recommendations for Outpatient Follow-up:  1. Follow up with Orthopedics and GI in 1 weeks 2. Please obtain BMP/CBC in one week 3. Please follow up on the following pending results: Final EGD biopsy results  Discharge Condition: Stable CODE STATUS: FULL  Diet recommendation: SOFT  Brief/Interim Summary: HPI: Kristine Olson is a 70 y.o. female with a medical history significant for, but not  limited to,  HTN, hyperlipidemia, OSA and GERD. Patient is s/p right knee replacement approx 2 weeks ago. She has been on xarelto since knee surgery. Patient has been having problems with constipation since surgery. She took miralax a few days ago and on Saturday passed some hard black stools. Patient subsequently began having more liquid black stools. She was pale and diaphoretic with bowel movements for husband. Patient's had a few episodes of nausea over the last couple days as well as they epigastric discomfort. Discomfort nonradiating,  constant and unrelated to meals. No significant NSAID use even prior to surgery. No history of peptic ulcer disease or gastrointestinal bleeding  Upper gastrointestinal bleeding on Xarelto with black stools, elevated BUN.            EGD with oozing gastric ulcer.  Pt on protonix 40 mg daily.  Hold anticoagulants for now.  Follow up with GI next week for recheck CBC.  Follow up with orthopedics.       Anemia of acute blood loss. S/p 2 units PRBC - Hg improved to 9.    Osteoarthritis, recent right knee surgery.  -Ortho consulted.  Recommended mobilization PT for DVT prophylaxis. - Ordered HH PT to be resumed.  DC Xarelto.  Follow up with Dr Wynelle Link.   Hypothyroidism.  -Continue home Synthroid when taking by mouth  Hypertension,. Blood  pressure controlled at present -Resume  Obstructive sleep apnea.    -Husband to bring in CPAP machine from home  Depression, stable -Continue home Celexa when taking by mouth  GERD, takes tums at home for heartburn. On PPI, -Needs PPI or H2 blocker at time of discharge regardless of EGD findings  History of sinus tachycardia, takes metoprolol at night. Heart rate currently normal at 82.  Resume home metoprolol   DVT prophylaxis:   SCDs   Code Status:     Full code   Family Communication:    Treatment plan discussed with  husband in the room and he understands and agrees with the plan..  Disposition Plan:   Discharge home in  24-48 hours         Consults called:   [facility]  Orthopedics - Dr.Norris   Discharge Diagnoses:  Principal Problem:   Bleeding gastrointestinal Active Problems:   Hypothyroidism   Esophageal reflux   History of colonic polyps   Hyperlipidemia   HTN (hypertension)   OSA (obstructive sleep apnea)   OA (osteoarthritis) of knee   Acute pyloric channel ulcer    Discharge Instructions     Medication List    STOP taking these medications   rivaroxaban 10 MG Tabs tablet Commonly known as:  XARELTO     TAKE these medications   acetaminophen 500 MG tablet Commonly known as:  TYLENOL Take 500-1,000 mg by mouth every 6 (six) hours as needed for mild pain, moderate pain, fever or headache.   calcium carbonate 750  MG chewable tablet Commonly known as:  TUMS EX Chew 1 tablet by mouth daily as needed for heartburn.   cetirizine 10 MG tablet Commonly known as:  ZYRTEC Take 10 mg by mouth daily as needed for allergies.   citalopram 20 MG tablet Commonly known as:  CELEXA Take 20 mg by mouth at bedtime.   levothyroxine 112 MCG tablet Commonly known as:  SYNTHROID, LEVOTHROID TAKE 1 TABLET DAILY EXCEPT FOR 1 AND 1/2 TABLETS ON WEDNESDAY What changed:  how much to take  how to take this  when to take this  additional instructions    methocarbamol 500 MG tablet Commonly known as:  ROBAXIN Take 1 tablet (500 mg total) by mouth every 6 (six) hours as needed for muscle spasms.   metoprolol tartrate 25 MG tablet Commonly known as:  LOPRESSOR TAKE 0.5 TABLETS (12.5 MG TOTAL) BY MOUTH 2 (TWO) TIMES DAILY. What changed:  how much to take  how to take this  when to take this  additional instructions   oxyCODONE 5 MG immediate release tablet Commonly known as:  Oxy IR/ROXICODONE Take 1-2 tablets (5-10 mg total) by mouth every 3 (three) hours as needed for breakthrough pain.   pantoprazole 40 MG tablet Commonly known as:  PROTONIX Take 1 tablet (40 mg total) by mouth daily.   spironolactone 25 MG tablet Commonly known as:  ALDACTONE Take 1 tablet (25 mg total) by mouth daily. What changed:  when to take this   Vitamin D 2000 units tablet Take 2,000 Units by mouth daily.      Follow-up Information    Gearlean Alf, MD. Schedule an appointment as soon as possible for a visit in 3 day(s).   Specialty:  Orthopedic Surgery Why:  Hospital Follow Up  Contact information: [address] [place] [address] [id number]        Silvano Rusk, MD. Schedule an appointment as soon as possible for a visit in 1 week(s).   Specialty:  Gastroenterology Why:  Hospital follow up Check CBC Contact information: [address] [address] [phone]          Allergies  Allergen Reactions  . Cefaclor Rash    Because of a history of documented adverse serious drug reaction;Medi Alert bracelet  is recommended  . Penicillins     REACTION: rash Because of a history of documented adverse serious drug reaction;Medi Alert bracelet  is recommended Has patient had a PCN reaction causing immediate rash, facial/tongue/throat swelling, SOB or lightheadedness with hypotension: no Has patient had a PCN reaction causing severe rash involving mucus membranes or skin necrosis: {no Has patient  had a PCN reaction that required hospitalization no Has patient had a PCN reaction occurring within the last 10 years: no If all of the above ans  . Sulfa Antibiotics Rash    01/22/13 after 9 days of Sulfa Because of a history of documented adverse serious drug reaction;Medi Alert bracelet  is recommended  . Advair Diskus [Fluticasone-Salmeterol]     "Felt weird" and flushing.   Procedures/Studies: No results found.  Subjective: Pt says she feels better today no more bleeding.   Discharge Exam: Vitals:   05/30/16 0300 05/30/16 1018  BP: (!) 120/57 130/71  Pulse: 77 89  Resp: 18   Temp: 98.2 F (36.8 C)    Vitals:   05/29/16 2315 05/30/16 0146 05/30/16 0300 05/30/16 1018  BP: 113/71 116/62 (!) 120/57 130/71  Pulse: 83 75 77 89  Resp:  18 18 18    Temp: 98.5 F (36.9 C) 98.2 F (36.8 C) 98.2 F (36.8 C)   TempSrc: Oral Oral Oral   SpO2: 98% 98% 96%   Weight:   95.7 kg (210 lb 15.7 oz)   Height:        General: Pt is alert, awake, not in acute distress Cardiovascular: RRR, S1/S2 +, no rubs, no gallops Respiratory: CTA bilaterally, no wheezing, no rhonchi Abdominal: Soft, NT, ND, bowel sounds + Extremities: no edema, no cyanosis    The results of significant diagnostics from this hospitalization (including imaging, microbiology, ancillary and laboratory) are listed below for reference.     Microbiology: No results found for this or any previous visit (from the past 240 hour(s)).   Labs: BNP (last 3 results) No results for input(s): BNP in the last 8760 hours. Basic Metabolic Panel:  Recent Labs Lab 05/29/16 0545 05/30/16 0445  NA 134* 140  K 4.4 3.7  CL 100* 104  CO2 26 28  GLUCOSE 124* 111*  BUN 41* 23*  CREATININE 1.06* 1.34*  CALCIUM 9.7 8.6*  MG 1.9  --    Liver Function Tests: No results for input(s): AST, ALT, ALKPHOS, BILITOT, PROT, ALBUMIN in the last 168 hours. No results for input(s): LIPASE, AMYLASE in the last 168 hours. No results for  input(s): AMMONIA in the last 168 hours. CBC:  Recent Labs Lab 05/29/16 0545 05/29/16 1426 05/29/16 1902 05/30/16 0445  WBC 12.2*  --   --  8.9  NEUTROABS 9.7*  --   --   --   HGB 7.9* 6.8* 6.5* 9.7*  HCT 23.5* 20.4* 19.9* 29.3*  MCV 87.0  --   --  87.7  PLT 221  --   --  187   Cardiac Enzymes: No results for input(s): CKTOTAL, CKMB, CKMBINDEX, TROPONINI in the last 168 hours. BNP: Invalid input(s): POCBNP CBG: No results for input(s): GLUCAP in the last 168 hours. D-Dimer No results for input(s): DDIMER in the last 72 hours. Hgb A1c No results for input(s): HGBA1C in the last 72 hours. Lipid Profile No results for input(s): CHOL, HDL, LDLCALC, TRIG, CHOLHDL, LDLDIRECT in the last 72 hours. Thyroid function studies No results for input(s): TSH, T4TOTAL, T3FREE, THYROIDAB in the last 72 hours.  Invalid input(s): FREET3 Anemia work up  Recent Labs  05/29/16 1426  VITAMINB12 136*  FOLATE 14.9  FERRITIN 24  TIBC 378  IRON 27*  RETICCTPCT 4.5*   Urinalysis    Component Value Date/Time   COLORURINE YELLOW 05/16/2016 1058   APPEARANCEUR CLOUDY (A) 05/16/2016 1058   LABSPEC 1.016 05/16/2016 1058   PHURINE 7.5 05/16/2016 1058   GLUCOSEU NEGATIVE 05/16/2016 1058   HGBUR NEGATIVE 05/16/2016 1058   BILIRUBINUR NEGATIVE 05/16/2016 1058   KETONESUR NEGATIVE 05/16/2016 1058   PROTEINUR NEGATIVE 05/16/2016 1058   NITRITE NEGATIVE 05/16/2016 1058   LEUKOCYTESUR MODERATE (A) 05/16/2016 1058   Sepsis Labs Invalid input(s): PROCALCITONIN,  WBC,  LACTICIDVEN Microbiology No results found for this or any previous visit (from the past 240 hour(s)).   Time coordinating discharge: 29 mins  SIGNED:  Irwin Brakeman, MD  [facility] 05/30/2016, 12:16 PM Pager   If 7PM-7AM, please contact night-coverage [email or web address] Password [email or web address]

## 2016-05-30 NOTE — Evaluation (Signed)
Physical Therapy Evaluation Patient Details Name: Kristine Olson MRN: [id number] DOB: 01-01-1946 Today's Date: 05/30/2016   History of Present Illness  Pt is a 70 year old female admitted for UGIB secondary to oozing gastric ulcer with clip placed during EGD 8/7.  Pt also s/p R uni knee one week ago.  Clinical Impression  Pt admitted with above diagnosis. Pt currently with functional limitations due to the deficits listed below (see PT Problem List).  Pt will benefit from skilled PT to increase their independence and safety with mobility to allow discharge to the venue listed below.   Pt reports only 2 HHPT visits prior to readmission after her R UKR.  Pt assisted with ambulating and performing LE exercises today.  Recommend resuming HHPT upon d/c.     Follow Up Recommendations Home health PT    Equipment Recommendations  None recommended by PT    Recommendations for Other Services       Precautions / Restrictions Precautions Precautions: Knee Restrictions Other Position/Activity Restrictions: WBAT      Mobility  Bed Mobility               General bed mobility comments: pt up in recliner on arrival  Transfers Overall transfer level: Needs assistance Equipment used: Rolling walker (2 wheeled) Transfers: Sit to/from Stand Sit to Stand: Supervision         General transfer comment: performs correct safe technique  Ambulation/Gait Ambulation/Gait assistance: Min guard;Supervision Ambulation Distance (Feet): 40 Feet Assistive device: Rolling walker (2 wheeled) Gait Pattern/deviations: Step-through pattern;Decreased stride length;Decreased dorsiflexion - right     General Gait Details: pt able to recall sequence, technique, heel strike however states gait not doing as well today due to limited mobility last couple days with feeling ill, uses RW appropriately, distance limited by fatigue  Stairs            Wheelchair Mobility    Modified Rankin (Stroke  Patients Only)       Balance                                             Pertinent Vitals/Pain Pain Assessment: 0-10 Pain Score: 3  Pain Location: R knee Pain Descriptors / Indicators: Sore;Tightness Pain Intervention(s): Limited activity within patient's tolerance;Monitored during session;Repositioned    Home Living Family/patient expects to be discharged to:: Private residence Living Arrangements: Spouse/significant other Available Help at Discharge: Family Type of Home: House Home Access: Stairs to enter   Technical brewer of Steps: 1 Home Layout: One level Home Equipment: Environmental consultant - 2 wheels      Prior Function Level of Independence: Independent with assistive device(s)         Comments: has been using RW since UKR     Hand Dominance        Extremity/Trunk Assessment               Lower Extremity Assessment: RLE deficits/detail RLE Deficits / Details: ankle WFL, able to perform SLR, limited AAROM knee flexion due to pain and one week post op UKR       Communication   Communication: No difficulties  Cognition Arousal/Alertness: Awake/alert Behavior During Therapy: WFL for tasks assessed/performed Overall Cognitive Status: Within Functional Limits for tasks assessed  General Comments      Exercises Total Joint Exercises Ankle Circles/Pumps: AROM;Both;10 reps Quad Sets: 10 reps;Both;AROM Short Arc Quad: Strengthening;Right;10 reps;AROM Heel Slides: AAROM;Right;10 reps;Seated Hip ABduction/ADduction: AROM;Right;10 reps Straight Leg Raises: AROM;Right;10 reps      Assessment/Plan    PT Assessment Patient needs continued PT services  PT Diagnosis Difficulty walking   PT Problem List Decreased strength;Decreased range of motion;Decreased activity tolerance;Decreased mobility  PT Treatment Interventions DME instruction;Gait training;Functional mobility training;Therapeutic  activities;Patient/family education;Therapeutic exercise;Stair training   PT Goals (Current goals can be found in the Care Plan section) Acute Rehab PT Goals PT Goal Formulation: With patient Time For Goal Achievement: 06/06/16 Potential to Achieve Goals: Good    Frequency Min 4X/week   Barriers to discharge        Co-evaluation               End of Session   Activity Tolerance: Patient limited by fatigue Patient left: in chair;with call bell/phone within reach;with family/visitor present           Time: [id number] PT Time Calculation (min) (ACUTE ONLY): 20 min   Charges:   PT Evaluation $PT Eval Low Complexity: 1 Procedure     PT G Codes:        [name],[name] 05/30/2016, 1:09 PM [name], PT, DPT 05/30/2016 Pager: [phone]

## 2016-05-30 NOTE — Progress Notes (Signed)
Pt is active with [facility] and referral was given to in house rep.

## 2016-05-30 NOTE — Progress Notes (Signed)
     [facility] Progress Note  Subjective:  Feels better.  Tolerating diet.  No further BM's or sign of bleeding.  Got a little light-headed after breakfast this morning.  Objective:  Vital signs in last 24 hours: Temp:  [97.5 F (36.4 C)-98.7 F (37.1 C)] 98.2 F (36.8 C) (08/08 0300) Pulse Rate:  [70-96] 89 (08/08 1018) Resp:  [16-20] 18 (08/08 0300) BP: (105-130)/(56-71) 130/71 (08/08 1018) SpO2:  [96 %-100 %] 96 % (08/08 0300) Weight:  [210 lb 15.7 oz (95.7 kg)] 210 lb 15.7 oz (95.7 kg) (08/08 0300) Last BM Date: 05/29/16 General:  Alert, Well-developed, in NAD Heart:  Regular rate and rhythm; no murmurs Pulm:  CTAB.  No W/R/R. Abdomen:  Soft, non-distended.  BS present.  Non-tender. Extremities:  Without edema. Neurologic:  Alert and oriented x 4;  grossly normal neurologically. Psych:  Alert and cooperative. Normal mood and affect.  Intake/Output from previous day: 08/07 0701 - 08/08 0700 In: 2353.8 [P.O.:600; I.V.:1032.1; Blood:721.8] Out: 4775 [Urine:4775]  Lab Results:  Recent Labs  05/29/16 0545 05/29/16 1426 05/29/16 1902 05/30/16 0445  WBC 12.2*  --   --  8.9  HGB 7.9* 6.8* 6.5* 9.7*  HCT 23.5* 20.4* 19.9* 29.3*  PLT 221  --   --  187   BMET  Recent Labs  05/29/16 0545 05/30/16 0445  NA 134* 140  K 4.4 3.7  CL 100* 104  CO2 26 28  GLUCOSE 124* 111*  BUN 41* 23*  CREATININE 1.06* 1.34*  CALCIUM 9.7 8.6*   Assessment / Plan: -UGIB secondary to oozing gastric ulcer with clip placed during EGD 8/7.  No further sign of bleeding. -Acute blood loss anemia:  S/p 2 units PRBC's with appropriate increase in Hgb to 9.7 grams. -S/p TKR one week ago:  Was on Xarelto, which is on hold.   *Initially Dr. Carlean Purl thought BID PPI for her, but thinks that once daily will be adequate. *Likely needs to be on some type of DVT prophylaxis, but would keep her off of Xarelto.  ? If ASA 325 mg would be adequate but really need to defer that to  ortho/hospitalist. *Will recheck CBC in our office in one week. *Can follow-up Hpylori study as outpatient.  **Ok for discharge later today from GI standpoint.   LOS: 1 day   ZEHR, JESSICA D.  05/30/2016, 11:02 AM  Pager number [phone]   Agree with Ms. Alphia Kava management.  Gatha Mayer, MD, [facility]

## 2016-05-30 NOTE — Discharge Instructions (Signed)

## 2016-05-30 NOTE — Progress Notes (Signed)
Orthopedics Progress Note  Subjective: Patient reports doing well this morning  Objective:  Vitals:   05/30/16 0146 05/30/16 0300  BP: 116/62 (!) 120/57  Pulse: 75 77  Resp: 18 18  Temp: 98.2 F (36.8 C) 98.2 F (36.8 C)    General: Awake and alert  Musculoskeletal: Mepilex dressing in place on right knee incision, no erythema, no swelling or tenderness. No pain with calf pump Neurovascularly intact  Lab Results  Component Value Date   WBC 8.9 05/30/2016   HGB 9.7 (L) 05/30/2016   HCT 29.3 (L) 05/30/2016   MCV 87.7 05/30/2016   PLT 187 05/30/2016       Component Value Date/Time   NA 140 05/30/2016 0445   K 3.7 05/30/2016 0445   CL 104 05/30/2016 0445   CO2 28 05/30/2016 0445   GLUCOSE 111 (H) 05/30/2016 0445   BUN 23 (H) 05/30/2016 0445   CREATININE 1.34 (H) 05/30/2016 0445   CREATININE 0.97 02/28/2013 1624   CALCIUM 8.6 (L) 05/30/2016 0445   GFRNONAA 40 (L) 05/30/2016 0445   GFRAA 46 (L) 05/30/2016 0445    Lab Results  Component Value Date   INR 1.09 05/29/2016   INR 1.17 05/29/2016   INR 1.13 05/16/2016    Assessment/Plan: S/p TKR with UGI bleed. Appreciate excellent care from GI and medical teams.  Will continue her recovery from the knee surgery with daily therapy and mobilization. PT, OT while in house and then as outpatient. Follow Dr Alusio's post op discharge instructions.  Keep regular follow up appointment with Dr Maureen Ralphs.  Thank you!   Kristine Olson. Veverly Fells, MD 05/30/2016 6:51 AM

## 2016-05-30 NOTE — Progress Notes (Signed)
Private Duty list given to pt and husband at bedside.

## 2016-05-31 ENCOUNTER — Telehealth: Payer: Self-pay | Admitting: *Deleted

## 2016-05-31 ENCOUNTER — Telehealth: Payer: Self-pay | Admitting: Internal Medicine

## 2016-05-31 LAB — H. PYLORI ANTIBODY, IGG

## 2016-05-31 NOTE — Telephone Encounter (Signed)
Transition Care Management Follow-up Telephone Call  PCP: Annye Asa, MD  Admit date: 05/29/2016 Discharge date: 05/30/2016   Admitted From: Home Disposition: Home with HHPT  Recommendations for Outpatient Follow-up:  1. Follow up with Orthopedics and GI in 1 weeks 2. Please obtain BMP/CBC in one week 3. Please follow up on the following pending results: Final EGD biopsy results  Discharge Condition: Stable CODE STATUS: FULL  Diet recommendation: SOFT  --   How have you been since you were released from the hospital? Pt reports she is feeling better. She was discharged yesterday and slept pretty good last night. She is trying to take it easy and recover. She will have [facility] restarting today or tomorrow.   Do you understand why you were in the hospital? yes   Do you understand the discharge instructions? yes   Where were you discharged to? Home   Items Reviewed:  Medications reviewed: yes  Allergies reviewed: yes  Dietary changes reviewed: yes, soft  Referrals reviewed: yes, Dr. Carlean Purl for outpatient follow-up and HHPT   Functional Questionnaire:   Activities of Daily Living (ADLs):   She states they are independent in the following: ambulation, feeding, continence, grooming, toileting and dressing States they require assistance with the following: bathing and hygiene   Any transportation issues/concerns?: no   Any patient concerns? no   Confirmed importance and date/time of follow-up visits scheduled yes  Provider Appointment booked with Dr. Annye Asa. 06/07/16 @ 11am appt HELD FOR PATIENT. Pt is going to make sure appt works with her husband's schedule and will call office to schedule.  Confirmed with patient if condition begins to worsen call PCP or go to the ER.  Patient was given the office number and encouraged to call back with question or concerns.  : yes

## 2016-05-31 NOTE — Telephone Encounter (Signed)
Patient is due for lab next week.  Patient notified to come next Wed or Thursday.

## 2016-06-01 ENCOUNTER — Telehealth: Payer: Self-pay | Admitting: Internal Medicine

## 2016-06-01 NOTE — Telephone Encounter (Signed)
Initially Dr. Carlean Purl thought BID PPI for her, but thinks that once daily will be adequate.  The pt has been advised to take the PPI once daily.

## 2016-06-04 ENCOUNTER — Other Ambulatory Visit: Payer: Self-pay | Admitting: Family Medicine

## 2016-06-07 ENCOUNTER — Encounter: Payer: Self-pay | Admitting: Family Medicine

## 2016-06-07 ENCOUNTER — Ambulatory Visit (INDEPENDENT_AMBULATORY_CARE_PROVIDER_SITE_OTHER): Payer: Medicare Other | Admitting: Family Medicine

## 2016-06-07 VITALS — BP 124/78 | HR 70 | Temp 98.8°F | Resp 16 | Ht 64.0 in | Wt 214.5 lb

## 2016-06-07 DIAGNOSIS — K274 Chronic or unspecified peptic ulcer, site unspecified, with hemorrhage: Secondary | ICD-10-CM

## 2016-06-07 LAB — CBC WITH DIFFERENTIAL/PLATELET
Basophils Absolute: 0 10*3/uL (ref 0.0–0.1)
Basophils Relative: 0.5 % (ref 0.0–3.0)
EOS PCT: 5.7 % — AB (ref 0.0–5.0)
Eosinophils Absolute: 0.5 10*3/uL (ref 0.0–0.7)
HCT: 29.5 % — ABNORMAL LOW (ref 36.0–46.0)
Hemoglobin: 9.8 g/dL — ABNORMAL LOW (ref 12.0–15.0)
LYMPHS ABS: 1.3 10*3/uL (ref 0.7–4.0)
Lymphocytes Relative: 16.4 % (ref 12.0–46.0)
MCHC: 33.1 g/dL (ref 30.0–36.0)
MCV: 88.3 fl (ref 78.0–100.0)
MONOS PCT: 5.3 % (ref 3.0–12.0)
Monocytes Absolute: 0.4 10*3/uL (ref 0.1–1.0)
NEUTROS ABS: 5.8 10*3/uL (ref 1.4–7.7)
NEUTROS PCT: 72.1 % (ref 43.0–77.0)
PLATELETS: 312 10*3/uL (ref 150.0–400.0)
RBC: 3.35 Mil/uL — ABNORMAL LOW (ref 3.87–5.11)
RDW: 14.9 % (ref 11.5–15.5)
WBC: 8.1 10*3/uL (ref 4.0–10.5)

## 2016-06-07 LAB — BASIC METABOLIC PANEL
BUN: 18 mg/dL (ref 6–23)
CALCIUM: 9.2 mg/dL (ref 8.4–10.5)
CO2: 28 meq/L (ref 19–32)
Chloride: 104 mEq/L (ref 96–112)
Creatinine, Ser: 1.09 mg/dL (ref 0.40–1.20)
GFR: 53.06 mL/min — ABNORMAL LOW (ref >60.00)
GLUCOSE: 83 mg/dL (ref 70–99)
POTASSIUM: 4.7 meq/L (ref 3.5–5.1)
Sodium: 138 mEq/L (ref 135–145)

## 2016-06-07 NOTE — Progress Notes (Signed)
   Subjective:    Patient ID: Kristine Olson, female    DOB: 08-31-1946, 70 y.o.   MRN: [id number]  [facility] f/u- pt was hospitalized 8/7-8/8 for GI bleed s/p R knee replacement and xarelto.  Pt presented to hospital w/ black stools.  EGD showed oozing gastric ulcer.  Pt received 2 units PRBCs and Hgb improved to 9.  Started on Protonix.  Xarelto was held.  Pt to get CBC, BMP today.  Since hospital d/c pt has not had any black or tarry stools.  Pt reports feeling better since d/c.  No CP, SOB, fatigue is improving.  Pt reports knee is doing well- Dr Maureen Ralphs is pleased w/ the progress.   Review of Systems For ROS see HPI     Objective:   Physical Exam  Constitutional: She is oriented to person, place, and time. She appears well-developed and well-nourished. No distress.  HENT:  Head: Normocephalic and atraumatic.  Eyes: Conjunctivae and EOM are normal. Pupils are equal, round, and reactive to light.  Neck: Normal range of motion. Neck supple. No thyromegaly present.  Cardiovascular: Normal rate, regular rhythm, normal heart sounds and intact distal pulses.   No murmur heard. Pulmonary/Chest: Effort normal and breath sounds normal. No respiratory distress.  Abdominal: Soft. She exhibits no distension. There is no tenderness.  Musculoskeletal: She exhibits no edema.  Lymphadenopathy:    She has no cervical adenopathy.  Neurological: She is alert and oriented to person, place, and time.  Skin: Skin is warm and dry.  Psychiatric: She has a normal mood and affect. Her behavior is normal.  Vitals reviewed.         Assessment & Plan:

## 2016-06-07 NOTE — Patient Instructions (Signed)
Follow up as needed We'll notify you of your lab results and forward them to Dr Carlean Purl Continue the Protonix daily You do not have H pylori!!!  This is good news!!! Please watch for any additional bleeding- bright red or dark and tarry would be concerning Call with any questions or concerns [facility]!!!!

## 2016-06-07 NOTE — Progress Notes (Signed)
Pre visit review using our clinic review tool, if applicable. No additional management support is needed unless otherwise documented below in the visit note. 

## 2016-06-07 NOTE — Assessment & Plan Note (Signed)
New.  Reviewed hospital notes and d/c summary.  Pt has not had any GI bleeding since she returned home.  Energy level is improving.  Pt is doing well on Protonix.  Due for repeat CBC and BMP.  Reviewed supportive care and red flags that should prompt return.  Pt expressed understanding and is in agreement w/ plan.

## 2016-06-07 NOTE — Progress Notes (Signed)
Advance diet as tolerated and take ferrous sulfate CBC w/ Dr. Birdie Riddle 2-3 mos My Chart note sent

## 2016-06-12 ENCOUNTER — Ambulatory Visit: Payer: Medicare Other | Attending: Orthopedic Surgery | Admitting: Physical Therapy

## 2016-06-12 ENCOUNTER — Encounter: Payer: Self-pay | Admitting: Physical Therapy

## 2016-06-12 DIAGNOSIS — M25661 Stiffness of right knee, not elsewhere classified: Secondary | ICD-10-CM | POA: Insufficient documentation

## 2016-06-12 DIAGNOSIS — M25561 Pain in right knee: Secondary | ICD-10-CM | POA: Diagnosis present

## 2016-06-12 DIAGNOSIS — R262 Difficulty in walking, not elsewhere classified: Secondary | ICD-10-CM | POA: Diagnosis present

## 2016-06-12 DIAGNOSIS — R2241 Localized swelling, mass and lump, right lower limb: Secondary | ICD-10-CM | POA: Diagnosis present

## 2016-06-12 NOTE — Therapy (Signed)
[place], [place], [address] Phone: [phone]   Fax:  [phone]  Physical Therapy Evaluation  Patient Details  Name: Kristine Olson MRN: [id number] Date of Birth: 05-26-1946 Referring Provider: Wynelle Link  Encounter Date: 06/12/2016      PT End of Session - 06/12/16 0907    Visit Number 1   Date for PT Re-Evaluation 08/12/16   PT Start Time 0847   PT Stop Time 0945   PT Time Calculation (min) 58 min   Activity Tolerance Patient tolerated treatment well   Behavior During Therapy [facility] for tasks assessed/performed      Past Medical History:  Diagnosis Date  . Arthritis    ostearthritis - right  medial knee  . Depression   . GERD (gastroesophageal reflux disease)    "heartburn daily"-TUMS helpful  . Hx of sinus tachycardia    rarely any problems since metoprolol use  . Hyperlipidemia   . Hypertension   . Hypothyroidism   . Sleep apnea    uses c pap    Past Surgical History:  Procedure Laterality Date  . BIOPSY BREAST     2 biopsies & 2 aspirations  . CESAREAN SECTION    . COLONOSCOPY W/ POLYPECTOMY  multiple since 2007   adenomas, diverticulosis;last 2015  . ESOPHAGOGASTRODUODENOSCOPY (EGD) WITH PROPOFOL N/A 05/29/2016   Procedure: ESOPHAGOGASTRODUODENOSCOPY (EGD) WITH PROPOFOL;  Surgeon: Gatha Mayer, MD;  Location: [facility];  Service: Endoscopy;  Laterality: N/A;  . PARTIAL KNEE ARTHROPLASTY Right 05/22/2016   Procedure: RIGHT KNEE MEDIAL UNICOMPARTMENTAL ARTHROPLASTY;  Surgeon: Gaynelle Arabian, MD;  Location: [facility];  Service: Orthopedics;  Laterality: Right;  . TONSILLECTOMY AND ADENOIDECTOMY    . WISDOM TOOTH EXTRACTION      There were no vitals filed for this visit.       Subjective Assessment - 06/12/16 0851    Subjective Patient underwent a right partial knee replacement 05/22/16.  She had a complication with a hospital stay due to a bleeding ulcer and had to  have a transfusion.  Reports that she has had some knee pain for about 10 years.   Limitations Lifting;Standing;Walking;House hold activities   Patient Stated Goals have less pain, normal ROM and walk   Currently in Pain? Yes   Pain Score 2    Pain Location Knee   Pain Orientation Right   Pain Descriptors / Indicators Tightness;Aching;Sore   Pain Type Surgical pain   Pain Onset 1 to 4 weeks ago   Pain Frequency Constant   Aggravating Factors  bending, walking pain to 6-7/10   Pain Relieving Factors rest helps at best 2/10   Effect of Pain on Daily Activities hurts and can't walk            [facility] PT Assessment - 06/12/16 0001      Assessment   Medical Diagnosis s/p right partial knee replacement   Referring Provider Aluisio   Onset Date/Surgical Date 05/22/16   Prior Therapy no     Precautions   Precautions None     Balance Screen   Has the patient fallen in the past 6 months No   Has the patient had a decrease in activity level because of a fear of falling?  No   Is the patient reluctant to leave their home because of a fear of falling?  No     Home Environment   Additional Comments has stairs to a bonus room,  does her own housework     Prior Function   Level of Independence Independent   Vocation Retired   Leisure no exercise     Observation/Other Assessments-Edema    Edema Circumferential     Circumferential Edema   Circumferential - Right mid patella 46 cm   Circumferential - Left  mid patella 44cm     ROM / Strength   AROM / PROM / Strength AROM;Strength;PROM     AROM   AROM Assessment Site Knee   Right/Left Knee Right   Right Knee Extension 5   Right Knee Flexion 75     PROM   PROM Assessment Site Knee   Right/Left Knee Right   Right Knee Extension 0   Right Knee Flexion 85     Strength   Overall Strength Comments 3+/5     Palpation   Palpation comment tender medially, slight warmth, has bandage on the anterior knee     Ambulation/Gait    Gait Comments uses a walker at night, uses a SPC with slow, stiff legged gait, antalgic on the right                   [facility] Adult PT Treatment/Exercise - 06/12/16 0001      Exercises   Exercises Knee/Hip     Knee/Hip Exercises: Aerobic   Nustep level 2 x 6 minutes     Knee/Hip Exercises: Machines for Strengthening   Cybex Leg Press 20# nd no weight 2x10 reps                PT Education - 06/12/16 0911    Education provided Yes   Education Details low load long duration stretch for flexion in chair   Person(s) Educated Patient   Methods Explanation;Demonstration;Handout   Comprehension Verbalized understanding;Returned demonstration          PT Short Term Goals - 06/12/16 0922      PT SHORT TERM GOAL #1   Title independent with initial HEP   Time 2   Period Weeks   Status New           PT Long Term Goals - 06/12/16 [id number]      PT LONG TERM GOAL #1   Title decrease pain 50%   Time 8   Period Weeks   Status New     PT LONG TERM GOAL #2   Title walk all community distances without device   Time 8   Period Weeks   Status New     PT LONG TERM GOAL #3   Title increase AROM of the right knee to 0-115 degrees flexion   Time 8   Period Weeks   Status New     PT LONG TERM GOAL #4   Title go up and down stairs reciprocally   Time 8   Period Weeks   Status New               Plan - 06/12/16 0908    Clinical Impression Statement Patient underwent a right medial compartment knee replacement on 05/22/16.  She had a complication of bleeding ulcer which required hospitalization.  She is have=ing swelling and decreased AROM, her AROM was 5-75 degrees flexion   Rehab Potential Good   PT Frequency 3x / week   PT Duration 4 weeks   PT Treatment/Interventions ADLs/Self Care Home Management;Cryotherapy;Electrical Stimulation;Functional mobility training;Stair training;Gait training;Therapeutic activities;Therapeutic exercise;Balance  training;Passive range of motion;Manual techniques;Vasopneumatic Device   PT Next Visit  Plan add exercises, ROM and could try modalities for swelling, work on gait   Consulted and Agree with Plan of Care Patient      Patient will benefit from skilled therapeutic intervention in order to improve the following deficits and impairments:  Abnormal gait, Decreased activity tolerance, Decreased balance, Decreased mobility, Decreased strength, Increased edema, Decreased range of motion, Decreased scar mobility, Difficulty walking, Pain  Visit Diagnosis: Pain in right knee - Plan: PT plan of care cert/re-cert  Stiffness of right knee, not elsewhere classified - Plan: PT plan of care cert/re-cert  Difficulty in walking, not elsewhere classified - Plan: PT plan of care cert/re-cert  Localized swelling, mass and lump, right lower limb - Plan: PT plan of care cert/re-cert      G-Codes - [id number]    Functional Assessment Tool Used Foto 66% limitation   Functional Limitation Mobility: Walking and moving around   Mobility: Walking and Moving Around Current Status [phone]) At least 60 percent but less than 80 percent impaired, limited or restricted   Mobility: Walking and Moving Around Goal Status [phone]) At least 40 percent but less than 60 percent impaired, limited or restricted       Problem List Patient Active Problem List   Diagnosis Date Noted  . Bleeding gastrointestinal 05/29/2016  . Acute pyloric channel ulcer   . OA (osteoarthritis) of knee 05/22/2016  . Physical exam 04/17/2016  . Hemorrhoids 12/03/2015  . Elevated serum creatinine 11/27/2014  . OSA (obstructive sleep apnea) 03/28/2013  . HTN (hypertension) 03/06/2013  . Positional vertigo 10/18/2012  . Fasting hyperglycemia 08/23/2012  . Vitamin D deficiency 08/23/2012  . Hyperlipidemia 07/06/2011  . Osteopenia 07/06/2011  . POSTMENOPAUSAL SYNDROME 03/01/2010  . Esophageal reflux 03/05/2009  . Hypothyroidism 11/20/2007   . IBS 11/20/2007  . TACHYCARDIA, HX OF 11/20/2007  . History of colonic polyps 11/20/2007    Sumner Boast., PT 06/12/2016, 12:47 PM  [place] [address], [place], [address] Phone: [phone]   Fax:  [phone]  Name: Kristine Olson MRN: [id number] Date of Birth: 1945/12/30

## 2016-06-14 ENCOUNTER — Ambulatory Visit: Payer: Medicare Other | Admitting: Physical Therapy

## 2016-06-14 ENCOUNTER — Encounter: Payer: Self-pay | Admitting: Physical Therapy

## 2016-06-14 DIAGNOSIS — R262 Difficulty in walking, not elsewhere classified: Secondary | ICD-10-CM

## 2016-06-14 DIAGNOSIS — M25561 Pain in right knee: Secondary | ICD-10-CM

## 2016-06-14 DIAGNOSIS — M25661 Stiffness of right knee, not elsewhere classified: Secondary | ICD-10-CM

## 2016-06-14 DIAGNOSIS — R2241 Localized swelling, mass and lump, right lower limb: Secondary | ICD-10-CM

## 2016-06-14 NOTE — Therapy (Signed)
[place], [place], [address] Phone: [phone]   Fax:  [phone]  Physical Therapy Treatment  Patient Details  Name: Kristine Olson MRN: [id number] Date of Birth: 1946-05-24 Referring Provider: Wynelle Link  Encounter Date: 06/14/2016      PT End of Session - 06/14/16 1134    Visit Number 2   Date for PT Re-Evaluation 08/12/16   PT Start Time 1059   PT Stop Time 1146   PT Time Calculation (min) 47 min   Activity Tolerance Patient tolerated treatment well   Behavior During Therapy [facility] for tasks assessed/performed      Past Medical History:  Diagnosis Date  . Arthritis    ostearthritis - right  medial knee  . Depression   . GERD (gastroesophageal reflux disease)    "heartburn daily"-TUMS helpful  . Hx of sinus tachycardia    rarely any problems since metoprolol use  . Hyperlipidemia   . Hypertension   . Hypothyroidism   . Sleep apnea    uses c pap    Past Surgical History:  Procedure Laterality Date  . BIOPSY BREAST     2 biopsies & 2 aspirations  . CESAREAN SECTION    . COLONOSCOPY W/ POLYPECTOMY  multiple since 2007   adenomas, diverticulosis;last 2015  . ESOPHAGOGASTRODUODENOSCOPY (EGD) WITH PROPOFOL N/A 05/29/2016   Procedure: ESOPHAGOGASTRODUODENOSCOPY (EGD) WITH PROPOFOL;  Surgeon: Gatha Mayer, MD;  Location: [facility];  Service: Endoscopy;  Laterality: N/A;  . PARTIAL KNEE ARTHROPLASTY Right 05/22/2016   Procedure: RIGHT KNEE MEDIAL UNICOMPARTMENTAL ARTHROPLASTY;  Surgeon: Gaynelle Arabian, MD;  Location: [facility];  Service: Orthopedics;  Laterality: Right;  . TONSILLECTOMY AND ADENOIDECTOMY    . WISDOM TOOTH EXTRACTION      There were no vitals filed for this visit.      Subjective Assessment - 06/14/16 1102    Subjective Pt reports no change since evaluation   Currently in Pain? Yes   Pain Score 1    Pain Location Knee   Pain Orientation Right             [facility] PT Assessment - 06/14/16 0001      AROM   Right Knee Extension 3   Right Knee Flexion 95                     [facility] Adult PT Treatment/Exercise - 06/14/16 0001      Ambulation/Gait   Ambulation/Gait Yes   Ambulation/Gait Assistance 5: Supervision   Ambulation Distance (Feet) 120 Feet   Assistive device None   Gait Pattern Step-through pattern;Decreased stance time - right;Decreased hip/knee flexion - right   Ambulation Surface Level;Indoor     Knee/Hip Exercises: Aerobic   Nustep level 3 x 6 minutes     Knee/Hip Exercises: Machines for Strengthening   Cybex Leg Press 20lb 2x10, RLE only x10 no weight     Knee/Hip Exercises: Seated   Long Arc Quad 2 sets;Right;10 reps   Hamstring Curl Right;2 sets;10 reps     Modalities   Modalities Vasopneumatic     Vasopneumatic   Number Minutes Vasopneumatic  15 minutes   Vasopnuematic Location  Knee   Vasopneumatic Pressure Medium   Vasopneumatic Temperature  32     Manual Therapy   Manual Therapy Passive ROM   Manual therapy comments Some PROM taken to end range and hels   Passive ROM R knee flexion and extension  PT Short Term Goals - 06/12/16 [id number]      PT SHORT TERM GOAL #1   Title independent with initial HEP   Time 2   Period Weeks   Status New           PT Long Term Goals - 06/12/16 [id number]      PT LONG TERM GOAL #1   Title decrease pain 50%   Time 8   Period Weeks   Status New     PT LONG TERM GOAL #2   Title walk all community distances without device   Time 8   Period Weeks   Status New     PT LONG TERM GOAL #3   Title increase AROM of the right knee to 0-115 degrees flexion   Time 8   Period Weeks   Status New     PT LONG TERM GOAL #4   Title go up and down stairs reciprocally   Time 8   Period Weeks   Status New               Plan - 06/14/16 1135    Clinical Impression Statement Pt tolerated a progression to therapeutic exercises well.  Reports some pain at the end range of flexion with MT. R knee swelling remains, pt with no hesitation to walk without SPC.   Rehab Potential Good   PT Frequency 3x / week   PT Duration 4 weeks   PT Treatment/Interventions ADLs/Self Care Home Management;Cryotherapy;Electrical Stimulation;Functional mobility training;Stair training;Gait training;Therapeutic activities;Therapeutic exercise;Balance training;Passive range of motion;Manual techniques;Vasopneumatic Device   PT Next Visit Plan exercises and MT to increase strength and ROM of RLE      Patient will benefit from skilled therapeutic intervention in order to improve the following deficits and impairments:  Abnormal gait, Decreased activity tolerance, Decreased balance, Decreased mobility, Decreased strength, Increased edema, Decreased range of motion, Decreased scar mobility, Difficulty walking, Pain  Visit Diagnosis: Pain in right knee  Stiffness of right knee, not elsewhere classified  Difficulty in walking, not elsewhere classified  Localized swelling, mass and lump, right lower limb     Problem List Patient Active Problem List   Diagnosis Date Noted  . Bleeding gastrointestinal 05/29/2016  . Acute pyloric channel ulcer   . OA (osteoarthritis) of knee 05/22/2016  . Physical exam 04/17/2016  . Hemorrhoids 12/03/2015  . Elevated serum creatinine 11/27/2014  . OSA (obstructive sleep apnea) 03/28/2013  . HTN (hypertension) 03/06/2013  . Positional vertigo 10/18/2012  . Fasting hyperglycemia 08/23/2012  . Vitamin D deficiency 08/23/2012  . Hyperlipidemia 07/06/2011  . Osteopenia 07/06/2011  . POSTMENOPAUSAL SYNDROME 03/01/2010  . Esophageal reflux 03/05/2009  . Hypothyroidism 11/20/2007  . IBS 11/20/2007  . TACHYCARDIA, HX OF 11/20/2007  . History of colonic polyps 11/20/2007    Scot Jun, PTA  06/14/2016, 11:40 AM  [place]  [place], [place], [address] Phone: [phone]   Fax:  [phone]  Name: Kristine Olson MRN: [id number] Date of Birth: 1946/10/17

## 2016-06-16 ENCOUNTER — Ambulatory Visit: Payer: Medicare Other | Admitting: Physical Therapy

## 2016-06-16 ENCOUNTER — Encounter: Payer: Self-pay | Admitting: Physical Therapy

## 2016-06-16 DIAGNOSIS — R2241 Localized swelling, mass and lump, right lower limb: Secondary | ICD-10-CM

## 2016-06-16 DIAGNOSIS — M25661 Stiffness of right knee, not elsewhere classified: Secondary | ICD-10-CM

## 2016-06-16 DIAGNOSIS — M25561 Pain in right knee: Secondary | ICD-10-CM

## 2016-06-16 NOTE — Therapy (Signed)
[place], [place], [address] Phone: [phone]   Fax:  [phone]  Physical Therapy Treatment  Patient Details  Name: Kristine Olson MRN: [id number] Date of Birth: 02/04/1946 Referring Provider: Wynelle Link  Encounter Date: 06/16/2016      PT End of Session - 06/16/16 1014    Visit Number 3   Date for PT Re-Evaluation 08/12/16   PT Start Time [id number]   PT Stop Time 1032   PT Time Calculation (min) 58 min      Past Medical History:  Diagnosis Date  . Arthritis    ostearthritis - right  medial knee  . Depression   . GERD (gastroesophageal reflux disease)    "heartburn daily"-TUMS helpful  . Hx of sinus tachycardia    rarely any problems since metoprolol use  . Hyperlipidemia   . Hypertension   . Hypothyroidism   . Sleep apnea    uses c pap    Past Surgical History:  Procedure Laterality Date  . BIOPSY BREAST     2 biopsies & 2 aspirations  . CESAREAN SECTION    . COLONOSCOPY W/ POLYPECTOMY  multiple since 2007   adenomas, diverticulosis;last 2015  . ESOPHAGOGASTRODUODENOSCOPY (EGD) WITH PROPOFOL N/A 05/29/2016   Procedure: ESOPHAGOGASTRODUODENOSCOPY (EGD) WITH PROPOFOL;  Surgeon: Gatha Mayer, MD;  Location: [facility];  Service: Endoscopy;  Laterality: N/A;  . PARTIAL KNEE ARTHROPLASTY Right 05/22/2016   Procedure: RIGHT KNEE MEDIAL UNICOMPARTMENTAL ARTHROPLASTY;  Surgeon: Gaynelle Arabian, MD;  Location: [facility];  Service: Orthopedics;  Laterality: Right;  . TONSILLECTOMY AND ADENOIDECTOMY    . WISDOM TOOTH EXTRACTION      There were no vitals filed for this visit.      Subjective Assessment - 06/16/16 0936    Subjective feeling better, just sore and stiff   Currently in Pain? Yes   Pain Score 1    Pain Location Knee   Pain Orientation Right                         [facility] Adult PT Treatment/Exercise - 06/16/16 0001      Knee/Hip Exercises: Aerobic   Recumbent Bike 6 min  full rev   Nustep L 4 2min     Knee/Hip Exercises: Machines for Strengthening   Cybex Knee Extension 2 sets 10 5#   Cybex Knee Flexion 2 sets 10 15#   Cybex Leg Press 20lb 2x10, RLE only x10 no weight   Total Gym Leg Press heel raises 2 sets 10 20#     Knee/Hip Exercises: Standing   Lateral Step Up Right;1 set;15 reps;Hand Hold: 2;Step Height: 6"   Forward Step Up Right;1 set;15 reps;Hand Hold: 1;Step Height: 6"   Other Standing Knee Exercises 3# HS curl 2 sets 10     Knee/Hip Exercises: Seated   Long Arc Quad Strengthening;Right;2 sets;10 reps;Weights   Long Arc Quad Weight 3 lbs.   Other Seated Knee/Hip Exercises fitter 1 blue flex/ext 15 times each   Sit to Sand 10 reps;without UE support     Vasopneumatic   Number Minutes Vasopneumatic  15 minutes   Vasopnuematic Location  Knee   Vasopneumatic Pressure Low   Vasopneumatic Temperature  32     Manual Therapy   Manual Therapy Passive ROM   Passive ROM RT knee flexion  AROM after  104  PT Short Term Goals - 06/12/16 [id number]      PT SHORT TERM GOAL #1   Title independent with initial HEP   Time 2   Period Weeks   Status New           PT Long Term Goals - 06/12/16 [id number]      PT LONG TERM GOAL #1   Title decrease pain 50%   Time 8   Period Weeks   Status New     PT LONG TERM GOAL #2   Title walk all community distances without device   Time 8   Period Weeks   Status New     PT LONG TERM GOAL #3   Title increase AROM of the right knee to 0-115 degrees flexion   Time 8   Period Weeks   Status New     PT LONG TERM GOAL #4   Title go up and down stairs reciprocally   Time 8   Period Weeks   Status New               Plan - 06/16/16 1014    Clinical Impression Statement pt tolerated increase in therapy ex, some cuing required to decrease compensation. Excellent increas in ROM and amb without AD in clinic.   PT Next Visit Plan Progress ROM,strength  and gait without AD      Patient will benefit from skilled therapeutic intervention in order to improve the following deficits and impairments:  Abnormal gait, Decreased activity tolerance, Decreased balance, Decreased mobility, Decreased strength, Increased edema, Decreased range of motion, Decreased scar mobility, Difficulty walking, Pain  Visit Diagnosis: Pain in right knee  Stiffness of right knee, not elsewhere classified  Localized swelling, mass and lump, right lower limb     Problem List Patient Active Problem List   Diagnosis Date Noted  . Bleeding gastrointestinal 05/29/2016  . Acute pyloric channel ulcer   . OA (osteoarthritis) of knee 05/22/2016  . Physical exam 04/17/2016  . Hemorrhoids 12/03/2015  . Elevated serum creatinine 11/27/2014  . OSA (obstructive sleep apnea) 03/28/2013  . HTN (hypertension) 03/06/2013  . Positional vertigo 10/18/2012  . Fasting hyperglycemia 08/23/2012  . Vitamin D deficiency 08/23/2012  . Hyperlipidemia 07/06/2011  . Osteopenia 07/06/2011  . POSTMENOPAUSAL SYNDROME 03/01/2010  . Esophageal reflux 03/05/2009  . Hypothyroidism 11/20/2007  . IBS 11/20/2007  . TACHYCARDIA, HX OF 11/20/2007  . History of colonic polyps 11/20/2007    PAYSEUR,ANGIE PTA 06/16/2016, 10:16 AM  [address] [place], [place], [address] Phone: [phone]   Fax:  [phone]  Name: Kristine Olson MRN: [id number] Date of Birth: Mar 13, 1946

## 2016-06-19 ENCOUNTER — Encounter: Payer: Self-pay | Admitting: Physical Therapy

## 2016-06-19 ENCOUNTER — Other Ambulatory Visit: Payer: Self-pay | Admitting: Family Medicine

## 2016-06-19 ENCOUNTER — Ambulatory Visit: Payer: Medicare Other | Admitting: Physical Therapy

## 2016-06-19 DIAGNOSIS — M25561 Pain in right knee: Secondary | ICD-10-CM | POA: Diagnosis not present

## 2016-06-19 DIAGNOSIS — M25661 Stiffness of right knee, not elsewhere classified: Secondary | ICD-10-CM

## 2016-06-19 DIAGNOSIS — R262 Difficulty in walking, not elsewhere classified: Secondary | ICD-10-CM

## 2016-06-19 DIAGNOSIS — R2241 Localized swelling, mass and lump, right lower limb: Secondary | ICD-10-CM

## 2016-06-19 NOTE — Therapy (Signed)
[address] [place], [place], [address] Phone: [phone]   Fax:  [phone]  Physical Therapy Treatment  Patient Details  Name: Kristine Olson MRN: [id number] Date of Birth: 02-07-1946 Referring Provider: Wynelle Link  Encounter Date: 06/19/2016      PT End of Session - 06/19/16 1143    Visit Number 4   Date for PT Re-Evaluation 08/12/16   PT Start Time 1100   PT Stop Time 1157   PT Time Calculation (min) 57 min   Activity Tolerance Patient tolerated treatment well   Behavior During Therapy [facility] for tasks assessed/performed      Past Medical History:  Diagnosis Date  . Arthritis    ostearthritis - right  medial knee  . Depression   . GERD (gastroesophageal reflux disease)    "heartburn daily"-TUMS helpful  . Hx of sinus tachycardia    rarely any problems since metoprolol use  . Hyperlipidemia   . Hypertension   . Hypothyroidism   . Sleep apnea    uses c pap    Past Surgical History:  Procedure Laterality Date  . BIOPSY BREAST     2 biopsies & 2 aspirations  . CESAREAN SECTION    . COLONOSCOPY W/ POLYPECTOMY  multiple since 2007   adenomas, diverticulosis;last 2015  . ESOPHAGOGASTRODUODENOSCOPY (EGD) WITH PROPOFOL N/A 05/29/2016   Procedure: ESOPHAGOGASTRODUODENOSCOPY (EGD) WITH PROPOFOL;  Surgeon: Gatha Mayer, MD;  [facility]: [facility];  Service: Endoscopy;  Laterality: N/A;  . PARTIAL KNEE ARTHROPLASTY Right 05/22/2016   Procedure: RIGHT KNEE MEDIAL UNICOMPARTMENTAL ARTHROPLASTY;  Surgeon: Gaynelle Arabian, MD;  Location: [facility];  Service: Orthopedics;  Laterality: Right;  . TONSILLECTOMY AND ADENOIDECTOMY    . WISDOM TOOTH EXTRACTION      There were no vitals filed for this visit.                       [facility] Adult PT Treatment/Exercise - 06/19/16 0001      Knee/Hip Exercises: Aerobic   Nustep L 4 5 min     Knee/Hip Exercises: Machines for Strengthening   Cybex  Knee Extension 2 sets 10 5#   Cybex Knee Flexion 20lb 2x10   Cybex Leg Press 20lb 2x10, RLE only x10 no weight     Knee/Hip Exercises: Standing   Lateral Step Up Right;1 set;15 reps;Hand Hold: 2;Step Height: 6"   Forward Step Up Right;1 set;15 reps;Hand Hold: 1;Step Height: 6"     Knee/Hip Exercises: Seated   Long Arc Quad Strengthening;Right;Weights;1 set;15 reps   Long Arc Quad Weight 3 lbs.     Vasopneumatic   Number Minutes Vasopneumatic  15 minutes   Vasopnuematic Location  Knee   Vasopneumatic Pressure Medium   Vasopneumatic Temperature  32     Manual Therapy   Manual Therapy Passive ROM   Passive ROM RT knee flexion  AROM 108 after MT                   PT Short Term Goals - 06/12/16 [id number]      PT SHORT TERM GOAL #1   Title independent with initial HEP   Time 2   Period Weeks   Status New           PT Long Term Goals - 06/12/16 [id number]      PT LONG TERM GOAL #1   Title decrease pain 50%   Time 8   Period  Weeks   Status New     PT LONG TERM GOAL #2   Title walk all community distances without device   Time 8   Period Weeks   Status New     PT LONG TERM GOAL #3   Title increase AROM of the right knee to 0-115 degrees flexion   Time 8   Period Weeks   Status New     PT LONG TERM GOAL #4   Title go up and down stairs reciprocally   Time 8   Period Weeks   Status New               Plan - 06/19/16 1144    Clinical Impression Statement Pt reports increase stiffness in RLE, but despite reports able to make full revolutions on stationary bike. Cues to decrease compensation with step ups, some reps pt requires HHA x1    Rehab Potential Good   PT Frequency 3x / week   PT Duration 4 weeks   PT Treatment/Interventions ADLs/Self Care Home Management;Cryotherapy;Electrical Stimulation;Functional mobility training;Stair training;Gait training;Therapeutic activities;Therapeutic exercise;Balance training;Passive range of motion;Manual  techniques;Vasopneumatic Device   PT Next Visit Plan Progress ROM,strength and gait without AD      Patient will benefit from skilled therapeutic intervention in order to improve the following deficits and impairments:  Abnormal gait, Decreased activity tolerance, Decreased balance, Decreased mobility, Decreased strength, Increased edema, Decreased range of motion, Decreased scar mobility, Difficulty walking, Pain  Visit Diagnosis: Pain in right knee  Stiffness of right knee, not elsewhere classified  Difficulty in walking, not elsewhere classified  Localized swelling, mass and lump, right lower limb     Problem List Patient Active Problem List   Diagnosis Date Noted  . Bleeding gastrointestinal 05/29/2016  . Acute pyloric channel ulcer   . OA (osteoarthritis) of knee 05/22/2016  . Physical exam 04/17/2016  . Hemorrhoids 12/03/2015  . Elevated serum creatinine 11/27/2014  . OSA (obstructive sleep apnea) 03/28/2013  . HTN (hypertension) 03/06/2013  . Positional vertigo 10/18/2012  . Fasting hyperglycemia 08/23/2012  . Vitamin D deficiency 08/23/2012  . Hyperlipidemia 07/06/2011  . Osteopenia 07/06/2011  . POSTMENOPAUSAL SYNDROME 03/01/2010  . Esophageal reflux 03/05/2009  . Hypothyroidism 11/20/2007  . IBS 11/20/2007  . TACHYCARDIA, HX OF 11/20/2007  . History of colonic polyps 11/20/2007    Scot Jun, PTA  06/19/2016, 11:46 AM  [place], [place], [address] Phone: [phone]   Fax:  [phone]  Name: Kristine Olson MRN: [id number] Date of Birth: 25-Sep-1946

## 2016-06-21 ENCOUNTER — Ambulatory Visit: Payer: Medicare Other | Admitting: Physical Therapy

## 2016-06-21 ENCOUNTER — Encounter: Payer: Self-pay | Admitting: Physical Therapy

## 2016-06-21 DIAGNOSIS — R262 Difficulty in walking, not elsewhere classified: Secondary | ICD-10-CM

## 2016-06-21 DIAGNOSIS — M25661 Stiffness of right knee, not elsewhere classified: Secondary | ICD-10-CM

## 2016-06-21 DIAGNOSIS — M25561 Pain in right knee: Secondary | ICD-10-CM

## 2016-06-21 NOTE — Therapy (Signed)
[place], [place], [address] Phone: [phone]   Fax:  [phone]  Physical Therapy Treatment  Patient Details  Name: Kristine Olson MRN: [id number] Date of Birth: 1946-02-28 Referring Provider: Wynelle Link  Encounter Date: 06/21/2016      PT End of Session - 06/21/16 1143    Visit Number 5   Date for PT Re-Evaluation 08/12/16   PT Start Time 1100   PT Stop Time 1158   PT Time Calculation (min) 58 min   Activity Tolerance Patient tolerated treatment well   Behavior During Therapy [facility] for tasks assessed/performed      Past Medical History:  Diagnosis Date  . Arthritis    ostearthritis - right  medial knee  . Depression   . GERD (gastroesophageal reflux disease)    "heartburn daily"-TUMS helpful  . Hx of sinus tachycardia    rarely any problems since metoprolol use  . Hyperlipidemia   . Hypertension   . Hypothyroidism   . Sleep apnea    uses c pap    Past Surgical History:  Procedure Laterality Date  . BIOPSY BREAST     2 biopsies & 2 aspirations  . CESAREAN SECTION    . COLONOSCOPY W/ POLYPECTOMY  multiple since 2007   adenomas, diverticulosis;last 2015  . ESOPHAGOGASTRODUODENOSCOPY (EGD) WITH PROPOFOL N/A 05/29/2016   Procedure: ESOPHAGOGASTRODUODENOSCOPY (EGD) WITH PROPOFOL;  Surgeon: Gatha Mayer, MD;  [facility]: [facility];  Service: Endoscopy;  Laterality: N/A;  . PARTIAL KNEE ARTHROPLASTY Right 05/22/2016   Procedure: RIGHT KNEE MEDIAL UNICOMPARTMENTAL ARTHROPLASTY;  Surgeon: Gaynelle Arabian, MD;  Location[facility];  Service: Orthopedics;  Laterality: Right;  . TONSILLECTOMY AND ADENOIDECTOMY    . WISDOM TOOTH EXTRACTION      There were no vitals filed for this visit.      Subjective Assessment - 06/21/16 1059    Subjective "Pretty good, its just still tight"   Currently in Pain? Yes   Pain Score 1    Pain Location Knee   Pain Orientation Right   Pain Descriptors  / Indicators Tightness                         [facility] Adult PT Treatment/Exercise - 06/21/16 0001      Knee/Hip Exercises: Aerobic   Nustep L 4 5 min     Knee/Hip Exercises: Machines for Strengthening   Cybex Leg Press 20lb 2x15   Total Gym Leg Press heel raises 2 sets 15 20#     Knee/Hip Exercises: Standing   Other Standing Knee Exercises Cable press downs 40lb 2x15      Knee/Hip Exercises: Seated   Long Arc Quad Strengthening;Right;Weights;1 set;15 reps   Long Arc Quad Weight 3 lbs.   Hamstring Curl Right;2 sets;10 reps   Hamstring Limitations red tband     Vasopneumatic   Number Minutes Vasopneumatic  15 minutes   Vasopnuematic Location  Knee   Vasopneumatic Pressure Medium   Vasopneumatic Temperature  32     Manual Therapy   Manual Therapy Passive ROM   Manual therapy comments Some PROM taken to end range and held   Passive ROM RT knee flexion                  PT Short Term Goals - 06/12/16 [id number]      PT SHORT TERM GOAL #1   Title independent with initial HEP  Time 2   Period Weeks   Status New           PT Long Term Goals - 06/12/16 [id number]      PT LONG TERM GOAL #1   Title decrease pain 50%   Time 8   Period Weeks   Status New     PT LONG TERM GOAL #2   Title walk all community distances without device   Time 8   Period Weeks   Status New     PT LONG TERM GOAL #3   Title increase AROM of the right knee to 0-115 degrees flexion   Time 8   Period Weeks   Status New     PT LONG TERM GOAL #4   Title go up and down stairs reciprocally   Time 8   Period Weeks   Status New               Plan - 06/21/16 1144    Clinical Impression Statement Continues to reports stiffness in the RLE. Tolerated all of today's interventions well evident by no subjective reports of increase pain. Pt has also progressed to cable press downs without issue.    Rehab Potential Good   PT Frequency 3x / week   PT Duration 4 weeks   PT  Treatment/Interventions ADLs/Self Care Home Management;Cryotherapy;Electrical Stimulation;Functional mobility training;Stair training;Gait training;Therapeutic activities;Therapeutic exercise;Balance training;Passive range of motion;Manual techniques;Vasopneumatic Device   PT Next Visit Plan Progress ROM,strength and gait without AD      Patient will benefit from skilled therapeutic intervention in order to improve the following deficits and impairments:  Abnormal gait, Decreased activity tolerance, Decreased balance, Decreased mobility, Decreased strength, Increased edema, Decreased range of motion, Decreased scar mobility, Difficulty walking, Pain  Visit Diagnosis: Pain in right knee  Stiffness of right knee, not elsewhere classified  Difficulty in walking, not elsewhere classified     Problem List Patient Active Problem List   Diagnosis Date Noted  . Bleeding gastrointestinal 05/29/2016  . Acute pyloric channel ulcer   . OA (osteoarthritis) of knee 05/22/2016  . Physical exam 04/17/2016  . Hemorrhoids 12/03/2015  . Elevated serum creatinine 11/27/2014  . OSA (obstructive sleep apnea) 03/28/2013  . HTN (hypertension) 03/06/2013  . Positional vertigo 10/18/2012  . Fasting hyperglycemia 08/23/2012  . Vitamin D deficiency 08/23/2012  . Hyperlipidemia 07/06/2011  . Osteopenia 07/06/2011  . POSTMENOPAUSAL SYNDROME 03/01/2010  . Esophageal reflux 03/05/2009  . Hypothyroidism 11/20/2007  . IBS 11/20/2007  . TACHYCARDIA, HX OF 11/20/2007  . History of colonic polyps 11/20/2007    Scot Jun, PTA  06/21/2016, 11:46 AM  [place] [place], [place], [address] Phone: [phone]   Fax:  [phone]  Name: MALARIE CRESAP MRN: [id number] Date of Birth: Aug 15, 1946

## 2016-06-23 ENCOUNTER — Encounter: Payer: Self-pay | Admitting: Physical Therapy

## 2016-06-23 ENCOUNTER — Ambulatory Visit: Payer: Medicare Other | Attending: Orthopedic Surgery | Admitting: Physical Therapy

## 2016-06-23 DIAGNOSIS — M25561 Pain in right knee: Secondary | ICD-10-CM | POA: Diagnosis present

## 2016-06-23 DIAGNOSIS — R2241 Localized swelling, mass and lump, right lower limb: Secondary | ICD-10-CM | POA: Insufficient documentation

## 2016-06-23 DIAGNOSIS — M25661 Stiffness of right knee, not elsewhere classified: Secondary | ICD-10-CM | POA: Diagnosis present

## 2016-06-23 DIAGNOSIS — R262 Difficulty in walking, not elsewhere classified: Secondary | ICD-10-CM | POA: Diagnosis present

## 2016-06-23 NOTE — Therapy (Signed)
[place] [place], [place], [address] Phone: [phone]   Fax:  [phone]  Physical Therapy Treatment  Patient Details  Name: Kristine Olson MRN: [id number] Date of Birth: 12-Oct-1946 Referring Provider: Wynelle Link  Encounter Date: 06/23/2016      PT End of Session - 06/23/16 1139    Visit Number 6   Date for PT Re-Evaluation 08/12/16   PT Start Time 1100   PT Stop Time 1155   PT Time Calculation (min) 55 min   Activity Tolerance Patient tolerated treatment well   Behavior During Therapy [facility] for tasks assessed/performed      Past Medical History:  Diagnosis Date  . Arthritis    ostearthritis - right  medial knee  . Depression   . GERD (gastroesophageal reflux disease)    "heartburn daily"-TUMS helpful  . Hx of sinus tachycardia    rarely any problems since metoprolol use  . Hyperlipidemia   . Hypertension   . Hypothyroidism   . Sleep apnea    uses c pap    Past Surgical History:  Procedure Laterality Date  . BIOPSY BREAST     2 biopsies & 2 aspirations  . CESAREAN SECTION    . COLONOSCOPY W/ POLYPECTOMY  multiple since 2007   adenomas, diverticulosis;last 2015  . ESOPHAGOGASTRODUODENOSCOPY (EGD) WITH PROPOFOL N/A 05/29/2016   Procedure: ESOPHAGOGASTRODUODENOSCOPY (EGD) WITH PROPOFOL;  Surgeon: Gatha Mayer, MD;  [facility];  Service: Endoscopy;  Laterality: N/A;  . PARTIAL KNEE ARTHROPLASTY Right 05/22/2016   Procedure: RIGHT KNEE MEDIAL UNICOMPARTMENTAL ARTHROPLASTY;  Surgeon: Gaynelle Arabian, MD;  [facility];  Service: Orthopedics;  Laterality: Right;  . TONSILLECTOMY AND ADENOIDECTOMY    . WISDOM TOOTH EXTRACTION      There were no vitals filed for this visit.      Subjective Assessment - 06/23/16 1100    Subjective "Good just some stiffness."   Currently in Pain? Yes   Pain Score --  <1   Pain Location Knee   Pain Orientation Right   Pain Descriptors /  Indicators Tightness            [facility] PT Assessment - 06/23/16 0001      AROM   Right Knee Extension 1   Right Knee Flexion 106                     [facility] Adult PT Treatment/Exercise - 06/23/16 0001      Ambulation/Gait   Stairs Yes   Stairs Assistance 5: Supervision   Stair Management Technique One rail Right;Step to pattern;Alternating pattern   Number of Stairs 24   Height of Stairs 6   Gait Comments Reports anterior R knee pan when controlling descents      Knee/Hip Exercises: Aerobic   Nustep L 4 5 min     Knee/Hip Exercises: Machines for Strengthening   Cybex Knee Extension 5lb 2x15    Cybex Knee Flexion 20lb 2x10   Cybex Leg Press 30lb 2x10     Knee/Hip Exercises: Standing   Other Standing Knee Exercises Cable press downs 40lb 2x15      Knee/Hip Exercises: Seated   Sit to Sand 2 sets;10 reps;without UE support     Vasopneumatic   Number Minutes Vasopneumatic  15 minutes   Vasopnuematic Location  Knee   Vasopneumatic Pressure Medium  PT Short Term Goals - 06/12/16 [id number]      PT SHORT TERM GOAL #1   Title independent with initial HEP   Time 2   Period Weeks   Status New           PT Long Term Goals - 06/23/16 1103      PT LONG TERM GOAL #1   Title decrease pain 50%   Status Achieved     PT LONG TERM GOAL #2   Title walk all community distances without device   Status On-going     PT LONG TERM GOAL #3   Title increase AROM of the right knee to 0-115 degrees flexion   Status On-going     PT LONG TERM GOAL #4   Title go up and down stairs reciprocally   Status On-going               Plan - 06/23/16 1139    Clinical Impression Statement Pt tolerated a progression to stair negotiation reports some anterior R knee pain when controlling descents with RLE. Pt also able to tolerate additional weigh ton leg press and cable press downs. Slight antalgic gait remains  with decrease hip and knee flexion  with RLE.   Rehab Potential Good   PT Frequency 3x / week   PT Duration 4 weeks   PT Treatment/Interventions ADLs/Self Care Home Management;Cryotherapy;Electrical Stimulation;Functional mobility training;Stair training;Gait training;Therapeutic activities;Therapeutic exercise;Balance training;Passive range of motion;Manual techniques;Vasopneumatic Device   PT Next Visit Plan Functional interventions, gait without AD      Patient will benefit from skilled therapeutic intervention in order to improve the following deficits and impairments:  Abnormal gait, Decreased activity tolerance, Decreased balance, Decreased mobility, Decreased strength, Increased edema, Decreased range of motion, Decreased scar mobility, Difficulty walking, Pain  Visit Diagnosis: Pain in right knee  Stiffness of right knee, not elsewhere classified  Difficulty in walking, not elsewhere classified  Localized swelling, mass and lump, right lower limb     Problem List Patient Active Problem List   Diagnosis Date Noted  . Bleeding gastrointestinal 05/29/2016  . Acute pyloric channel ulcer   . OA (osteoarthritis) of knee 05/22/2016  . Physical exam 04/17/2016  . Hemorrhoids 12/03/2015  . Elevated serum creatinine 11/27/2014  . OSA (obstructive sleep apnea) 03/28/2013  . HTN (hypertension) 03/06/2013  . Positional vertigo 10/18/2012  . Fasting hyperglycemia 08/23/2012  . Vitamin D deficiency 08/23/2012  . Hyperlipidemia 07/06/2011  . Osteopenia 07/06/2011  . POSTMENOPAUSAL SYNDROME 03/01/2010  . Esophageal reflux 03/05/2009  . Hypothyroidism 11/20/2007  . IBS 11/20/2007  . TACHYCARDIA, HX OF 11/20/2007  . History of colonic polyps 11/20/2007    Scot Jun 06/23/2016, 11:46 AM  [address] [place], [place], [address] Phone: [phone]   Fax:  [phone]  Name: Kristine Olson MRN: [id number] Date of Birth:  1946-05-22

## 2016-06-27 ENCOUNTER — Encounter: Payer: Medicare Other | Admitting: Physical Therapy

## 2016-06-27 ENCOUNTER — Ambulatory Visit: Payer: Medicare Other | Admitting: Physical Therapy

## 2016-06-27 DIAGNOSIS — M25561 Pain in right knee: Secondary | ICD-10-CM | POA: Diagnosis not present

## 2016-06-27 DIAGNOSIS — R2241 Localized swelling, mass and lump, right lower limb: Secondary | ICD-10-CM

## 2016-06-27 DIAGNOSIS — M25661 Stiffness of right knee, not elsewhere classified: Secondary | ICD-10-CM

## 2016-06-27 DIAGNOSIS — R262 Difficulty in walking, not elsewhere classified: Secondary | ICD-10-CM

## 2016-06-27 NOTE — Therapy (Signed)
[place], [place], [address] Phone: [phone]   Fax:  [phone]  Physical Therapy Treatment  Patient Details  Name: Kristine Olson MRN: [id number] Date of Birth: 11-16-1945 Referring Provider: Wynelle Link  Encounter Date: 06/27/2016      PT End of Session - 06/27/16 1145    Visit Number 7   Date for PT Re-Evaluation 08/12/16   PT Start Time 1100   PT Stop Time 1152   PT Time Calculation (min) 52 min   Activity Tolerance Patient tolerated treatment well   Behavior During Therapy [facility] for tasks assessed/performed      Past Medical History:  Diagnosis Date  . Arthritis    ostearthritis - right  medial knee  . Depression   . GERD (gastroesophageal reflux disease)    "heartburn daily"-TUMS helpful  . Hx of sinus tachycardia    rarely any problems since metoprolol use  . Hyperlipidemia   . Hypertension   . Hypothyroidism   . Sleep apnea    uses c pap    Past Surgical History:  Procedure Laterality Date  . BIOPSY BREAST     2 biopsies & 2 aspirations  . CESAREAN SECTION    . COLONOSCOPY W/ POLYPECTOMY  multiple since 2007   adenomas, diverticulosis;last 2015  . ESOPHAGOGASTRODUODENOSCOPY (EGD) WITH PROPOFOL N/A 05/29/2016   Procedure: ESOPHAGOGASTRODUODENOSCOPY (EGD) WITH PROPOFOL;  Surgeon: Gatha Mayer, MD;  [facility];  Service: Endoscopy;  Laterality: N/A;  . PARTIAL KNEE ARTHROPLASTY Right 05/22/2016   Procedure: RIGHT KNEE MEDIAL UNICOMPARTMENTAL ARTHROPLASTY;  Surgeon: Gaynelle Arabian, MD;  Location[facility];  Service: Orthopedics;  Laterality: Right;  . TONSILLECTOMY AND ADENOIDECTOMY    . WISDOM TOOTH EXTRACTION      There were no vitals filed for this visit.      Subjective Assessment - 06/27/16 1102    Subjective "Im doing all right"   Currently in Pain? Yes   Pain Score --  <1   Pain Location Knee   Pain Orientation Right   Pain Descriptors /  Indicators Tightness            [facility] PT Assessment - 06/27/16 0001      AROM   Right Knee Extension 0   Right Knee Flexion 110                     [facility] Adult PT Treatment/Exercise - 06/27/16 0001      Ambulation/Gait   Ambulation/Gait Yes   Ambulation/Gait Assistance 6: Modified independent (Device/Increase time);5: Supervision   Ambulation Distance (Feet) --  >350   Assistive device None   Gait Pattern Step-through pattern;Decreased hip/knee flexion - right   Ambulation Surface Outdoor;Paved   Stairs Yes   Stairs Assistance 5: Supervision   Stair Management Technique One rail Right;Step to pattern;Alternating pattern   Number of Stairs 24   Height of Stairs 6   Gait Comments negotiated one flight of stairs and one additional rep descending, reports anterior R knee pain when controlling descents with RLE. Gait outside over uneven terrain up slope. forward lean some use of rail with RUE. Pt put both UE on back due to fatigue. Denies low back pain     Knee/Hip Exercises: Aerobic   Stationary Bike L0 x6 min      Knee/Hip Exercises: Machines for Strengthening   Cybex Knee Extension 5lb 2x10   Cybex Knee Flexion 20lb 2x10  Cybex Leg Press 20lb 2x15; heel raises 2x15      Knee/Hip Exercises: Standing   Other Standing Knee Exercises Standing march 3lb 3x10     Knee/Hip Exercises: Seated   Sit to Sand 2 sets;10 reps;without UE support  holding red ball      Modalities   Modalities Cryotherapy     Cryotherapy   Number Minutes Cryotherapy 15 Minutes   Cryotherapy Location Knee   Type of Cryotherapy Ice pack                  PT Short Term Goals - 06/12/16 [id number]      PT SHORT TERM GOAL #1   Title independent with initial HEP   Time 2   Period Weeks   Status New           PT Long Term Goals - 06/23/16 1103      PT LONG TERM GOAL #1   Title decrease pain 50%   Status Achieved     PT LONG TERM GOAL #2   Title walk all community  distances without device   Status On-going     PT LONG TERM GOAL #3   Title increase AROM of the right knee to 0-115 degrees flexion   Status On-going     PT LONG TERM GOAL #4   Title go up and down stairs reciprocally   Status On-going               Plan - 06/27/16 1146    Clinical Impression Statement Pt continues to progress towards all goals.  Pt tolerated ambulation over uneven surfaces without AD. Pt has also  increase her AROM for RLE. Pt reports some anterior R knee pain when descending stairs.   Rehab Potential Good   PT Frequency 3x / week   PT Duration 4 weeks   PT Treatment/Interventions ADLs/Self Care Home Management;Cryotherapy;Electrical Stimulation;Functional mobility training;Stair training;Gait training;Therapeutic activities;Therapeutic exercise;Balance training;Passive range of motion;Manual techniques;Vasopneumatic Device   PT Next Visit Plan Functional interventions, gait without AD      Patient will benefit from skilled therapeutic intervention in order to improve the following deficits and impairments:  Abnormal gait, Decreased activity tolerance, Decreased balance, Decreased mobility, Decreased strength, Increased edema, Decreased range of motion, Decreased scar mobility, Difficulty walking, Pain  Visit Diagnosis: Pain in right knee  Localized swelling, mass and lump, right lower limb  Difficulty in walking, not elsewhere classified  Stiffness of right knee, not elsewhere classified     Problem List Patient Active Problem List   Diagnosis Date Noted  . Bleeding gastrointestinal 05/29/2016  . Acute pyloric channel ulcer   . OA (osteoarthritis) of knee 05/22/2016  . Physical exam 04/17/2016  . Hemorrhoids 12/03/2015  . Elevated serum creatinine 11/27/2014  . OSA (obstructive sleep apnea) 03/28/2013  . HTN (hypertension) 03/06/2013  . Positional vertigo 10/18/2012  . Fasting hyperglycemia 08/23/2012  . Vitamin D deficiency 08/23/2012  .  Hyperlipidemia 07/06/2011  . Osteopenia 07/06/2011  . POSTMENOPAUSAL SYNDROME 03/01/2010  . Esophageal reflux 03/05/2009  . Hypothyroidism 11/20/2007  . IBS 11/20/2007  . TACHYCARDIA, HX OF 11/20/2007  . History of colonic polyps 11/20/2007    Scot Jun, PTA  06/27/2016, 11:49 AM  [place], [place], [address] Phone: [phone]   Fax:  [phone]  Name: Kristine Olson MRN: [id number] Date of Birth: [birth year]

## 2016-06-28 ENCOUNTER — Ambulatory Visit: Payer: Medicare Other | Admitting: Physical Therapy

## 2016-06-28 ENCOUNTER — Encounter: Payer: Self-pay | Admitting: Physical Therapy

## 2016-06-28 DIAGNOSIS — M25661 Stiffness of right knee, not elsewhere classified: Secondary | ICD-10-CM

## 2016-06-28 DIAGNOSIS — M25561 Pain in right knee: Secondary | ICD-10-CM | POA: Diagnosis not present

## 2016-06-28 DIAGNOSIS — R2241 Localized swelling, mass and lump, right lower limb: Secondary | ICD-10-CM

## 2016-06-28 DIAGNOSIS — R262 Difficulty in walking, not elsewhere classified: Secondary | ICD-10-CM

## 2016-06-28 NOTE — Therapy (Signed)
[place], [place], [address] Phone: [phone]   Fax:  [phone]  Physical Therapy Treatment  Patient Details  Name: Kristine Olson MRN: [id number] Date of Birth: 05-Jul-1946 Referring Provider: Wynelle Link  Encounter Date: 06/28/2016      PT End of Session - 06/28/16 1603    Visit Number 8   Date for PT Re-Evaluation 08/12/16   PT Start Time 1519   PT Stop Time 1613   PT Time Calculation (min) 54 min   Activity Tolerance Patient tolerated treatment well   Behavior During Therapy [facility] for tasks assessed/performed      Past Medical History:  Diagnosis Date  . Arthritis    ostearthritis - right  medial knee  . Depression   . GERD (gastroesophageal reflux disease)    "heartburn daily"-TUMS helpful  . Hx of sinus tachycardia    rarely any problems since metoprolol use  . Hyperlipidemia   . Hypertension   . Hypothyroidism   . Sleep apnea    uses c pap    Past Surgical History:  Procedure Laterality Date  . BIOPSY BREAST     2 biopsies & 2 aspirations  . CESAREAN SECTION    . COLONOSCOPY W/ POLYPECTOMY  multiple since 2007   adenomas, diverticulosis;last 2015  . ESOPHAGOGASTRODUODENOSCOPY (EGD) WITH PROPOFOL N/A 05/29/2016   Procedure: ESOPHAGOGASTRODUODENOSCOPY (EGD) WITH PROPOFOL;  Surgeon: Gatha Mayer, MD;  [facility];  Service: Endoscopy;  Laterality: N/A;  . PARTIAL KNEE ARTHROPLASTY Right 05/22/2016   Procedure: RIGHT KNEE MEDIAL UNICOMPARTMENTAL ARTHROPLASTY;  Surgeon: Gaynelle Arabian, MD;  [facility];  Service: Orthopedics;  Laterality: Right;  . TONSILLECTOMY AND ADENOIDECTOMY    . WISDOM TOOTH EXTRACTION      There were no vitals filed for this visit.      Subjective Assessment - 06/28/16 1521    Subjective Pt reports that the MD is pleased with her progress   Currently in Pain? No/denies   Pain Score 0-No pain  <1                          [facility] Adult PT Treatment/Exercise - 06/28/16 0001      Ambulation/Gait   Ambulation/Gait Yes   Ambulation/Gait Assistance 6: Modified independent (Device/Increase time);5: Supervision   Ambulation Distance (Feet) 40 Feet   Assistive device None   Gait Pattern Step-through pattern   Ambulation Surface Level;Indoor   Stairs Yes   Stairs Assistance 5: Supervision   Stair Management Technique One rail Right;Step to pattern;Alternating pattern   Number of Stairs 24   Height of Stairs 6   Gait Comments negotiated one flight of stairs reports anterior R knee pain when controlling descents with RLE. Heavy use of rail when ascending.  gait in hall way some wabble, wabble decreases as cadence increases     Knee/Hip Exercises: Aerobic   Stationary Bike L0 x5 min    Nustep L4 16min      Knee/Hip Exercises: Machines for Strengthening   Cybex Leg Press 30lb 2x15      Knee/Hip Exercises: Standing   Other Standing Knee Exercises Standing march 3lb 3x10   Other Standing Knee Exercises Cable press downs 40lb 2x15      Knee/Hip Exercises: Seated   Long Arc Quad Strengthening;Right;Weights;1 set;15 reps   Long Arc Quad Weight 3 lbs.     Vasopneumatic   Number Minutes Vasopneumatic  15 minutes   Vasopnuematic Location  Knee   Vasopneumatic Pressure Medium   Vasopneumatic Temperature  32                  PT Short Term Goals - 06/12/16 [id number]      PT SHORT TERM GOAL #1   Title independent with initial HEP   Time 2   Period Weeks   Status New           PT Long Term Goals - 06/23/16 1103      PT LONG TERM GOAL #1   Title decrease pain 50%   Status Achieved     PT LONG TERM GOAL #2   Title walk all community distances without device   Status On-going     PT LONG TERM GOAL #3   Title increase AROM of the right knee to 0-115 degrees flexion   Status On-going     PT LONG TERM GOAL #4   Title go up and down stairs reciprocally    Status On-going               Plan - 06/28/16 1604    Clinical Impression Statement Pt continues to reports increase anterior R knee pain when descending stairs but able to negotiate 2 full flights. Able to progress to 6 inch step with little compensation. Pt able to perform leg press with additional weight.   Rehab Potential Good   PT Frequency 3x / week   PT Duration 4 weeks   PT Treatment/Interventions ADLs/Self Care Home Management;Cryotherapy;Electrical Stimulation;Functional mobility training;Stair training;Gait training;Therapeutic activities;Therapeutic exercise;Balance training;Passive range of motion;Manual techniques;Vasopneumatic Device   PT Next Visit Plan Functional interventions, gait without AD      Patient will benefit from skilled therapeutic intervention in order to improve the following deficits and impairments:  Abnormal gait, Decreased activity tolerance, Decreased balance, Decreased mobility, Decreased strength, Increased edema, Decreased range of motion, Decreased scar mobility, Difficulty walking, Pain  Visit Diagnosis: Pain in right knee  Localized swelling, mass and lump, right lower limb  Difficulty in walking, not elsewhere classified  Stiffness of right knee, not elsewhere classified     Problem List Patient Active Problem List   Diagnosis Date Noted  . Bleeding gastrointestinal 05/29/2016  . Acute pyloric channel ulcer   . OA (osteoarthritis) of knee 05/22/2016  . Physical exam 04/17/2016  . Hemorrhoids 12/03/2015  . Elevated serum creatinine 11/27/2014  . OSA (obstructive sleep apnea) 03/28/2013  . HTN (hypertension) 03/06/2013  . Positional vertigo 10/18/2012  . Fasting hyperglycemia 08/23/2012  . Vitamin D deficiency 08/23/2012  . Hyperlipidemia 07/06/2011  . Osteopenia 07/06/2011  . POSTMENOPAUSAL SYNDROME 03/01/2010  . Esophageal reflux 03/05/2009  . Hypothyroidism 11/20/2007  . IBS 11/20/2007  . TACHYCARDIA, HX OF 11/20/2007   . History of colonic polyps 11/20/2007    Scot Jun, PTA  06/28/2016, 4:07 PM  [place], [place], [address] Phone: [phone]   Fax:  [phone]  Name: Kristine Olson MRN: [id number] Date of Birth: 03-Sep-1946

## 2016-06-30 ENCOUNTER — Encounter: Payer: Self-pay | Admitting: Physical Therapy

## 2016-06-30 ENCOUNTER — Ambulatory Visit: Payer: Medicare Other | Admitting: Physical Therapy

## 2016-06-30 DIAGNOSIS — R2241 Localized swelling, mass and lump, right lower limb: Secondary | ICD-10-CM

## 2016-06-30 DIAGNOSIS — M25561 Pain in right knee: Secondary | ICD-10-CM

## 2016-06-30 DIAGNOSIS — R262 Difficulty in walking, not elsewhere classified: Secondary | ICD-10-CM

## 2016-06-30 DIAGNOSIS — M25661 Stiffness of right knee, not elsewhere classified: Secondary | ICD-10-CM

## 2016-06-30 NOTE — Therapy (Signed)
[place] [address] [place], [place], [address] Phone: [phone]   Fax:  [phone]  Physical Therapy Treatment  Patient Details  Name: Kristine Olson MRN: [id number] Date of Birth: 1945/11/02 Referring Provider: Wynelle Link  Encounter Date: 06/30/2016      PT End of Session - 06/30/16 1142    Visit Number 9   Date for PT Re-Evaluation 08/12/16   PT Start Time 1100   PT Stop Time 1153   PT Time Calculation (min) 53 min   Activity Tolerance Patient tolerated treatment well   Behavior During Therapy [facility] for tasks assessed/performed      Past Medical History:  Diagnosis Date  . Arthritis    ostearthritis - right  medial knee  . Depression   . GERD (gastroesophageal reflux disease)    "heartburn daily"-TUMS helpful  . Hx of sinus tachycardia    rarely any problems since metoprolol use  . Hyperlipidemia   . Hypertension   . Hypothyroidism   . Sleep apnea    uses c pap    Past Surgical History:  Procedure Laterality Date  . BIOPSY BREAST     2 biopsies & 2 aspirations  . CESAREAN SECTION    . COLONOSCOPY W/ POLYPECTOMY  multiple since 2007   adenomas, diverticulosis;last 2015  . ESOPHAGOGASTRODUODENOSCOPY (EGD) WITH PROPOFOL N/A 05/29/2016   Procedure: ESOPHAGOGASTRODUODENOSCOPY (EGD) WITH PROPOFOL;  Surgeon: Gatha Mayer, MD;  [facility];  Service: Endoscopy;  Laterality: N/A;  . PARTIAL KNEE ARTHROPLASTY Right 05/22/2016   Procedure: RIGHT KNEE MEDIAL UNICOMPARTMENTAL ARTHROPLASTY;  Surgeon: Gaynelle Arabian, MD;  [facility];  Service: Orthopedics;  Laterality: Right;  . TONSILLECTOMY AND ADENOIDECTOMY    . WISDOM TOOTH EXTRACTION      There were no vitals filed for this visit.      Subjective Assessment - 06/30/16 1103    Subjective "It fells like someone put a rubber band on it and double it"   Currently in Pain? No/denies   Pain Score 0-No pain                          [facility] Adult PT Treatment/Exercise - 06/30/16 0001      Ambulation/Gait   Stairs Yes   Stairs Assistance 5: Supervision   Stair Management Technique One rail Right;Step to pattern;Alternating pattern   Number of Stairs 24   Height of Stairs 6   Gait Comments negotiated one flight of stairs reports anterior R knee pain when controlling descents with RLE. Heavy use of rail when ascending.  Pt reports pain in L knee when ascending stairs     Knee/Hip Exercises: Aerobic   Nustep L4 50min   LE only      Knee/Hip Exercises: Machines for Strengthening   Cybex Leg Press 30lb 2x10, blue tband RKE for TKE, RLE only 2x5, fifth rep of each set descends slow     Knee/Hip Exercises: Standing   Other Standing Knee Exercises Cable press downs 50lb 2x15      Modalities   Modalities Vasopneumatic     Vasopneumatic   Number Minutes Vasopneumatic  15 minutes   Vasopnuematic Location  Knee   Vasopneumatic Pressure Medium   Vasopneumatic Temperature  32                  PT Short Term Goals - 06/12/16 [id number]      PT SHORT TERM GOAL #  1   Title independent with initial HEP   Time 2   Period Weeks   Status New           PT Long Term Goals - 06/23/16 1103      PT LONG TERM GOAL #1   Title decrease pain 50%   Status Achieved     PT LONG TERM GOAL #2   Title walk all community distances without device   Status On-going     PT LONG TERM GOAL #3   Title increase AROM of the right knee to 0-115 degrees flexion   Status On-going     PT LONG TERM GOAL #4   Title go up and down stairs reciprocally   Status On-going               Plan - 06/30/16 1144    Clinical Impression Statement New reports of L knee pain when ascending stairs. Continues to have anterior R knee pain when descending stairs. Assist required to initiate movement when doing leg press with RLE only.  All other interventions performed well.   Rehab Potential Good   PT  Frequency 3x / week   PT Duration 4 weeks   PT Treatment/Interventions ADLs/Self Care Home Management;Cryotherapy;Electrical Stimulation;Functional mobility training;Stair training;Gait training;Therapeutic activities;Therapeutic exercise;Balance training;Passive range of motion;Manual techniques;Vasopneumatic Device   PT Next Visit Plan Eccentric strengthening of RLE      Patient will benefit from skilled therapeutic intervention in order to improve the following deficits and impairments:  Abnormal gait, Decreased activity tolerance, Decreased balance, Decreased mobility, Decreased strength, Increased edema, Decreased range of motion, Decreased scar mobility, Difficulty walking, Pain  Visit Diagnosis: Pain in right knee  Localized swelling, mass and lump, right lower limb  Difficulty in walking, not elsewhere classified  Stiffness of right knee, not elsewhere classified     Problem List Patient Active Problem List   Diagnosis Date Noted  . Bleeding gastrointestinal 05/29/2016  . Acute pyloric channel ulcer   . OA (osteoarthritis) of knee 05/22/2016  . Physical exam 04/17/2016  . Hemorrhoids 12/03/2015  . Elevated serum creatinine 11/27/2014  . OSA (obstructive sleep apnea) 03/28/2013  . HTN (hypertension) 03/06/2013  . Positional vertigo 10/18/2012  . Fasting hyperglycemia 08/23/2012  . Vitamin D deficiency 08/23/2012  . Hyperlipidemia 07/06/2011  . Osteopenia 07/06/2011  . POSTMENOPAUSAL SYNDROME 03/01/2010  . Esophageal reflux 03/05/2009  . Hypothyroidism 11/20/2007  . IBS 11/20/2007  . TACHYCARDIA, HX OF 11/20/2007  . History of colonic polyps 11/20/2007    Scot Jun, PTA  06/30/2016, 11:48 AM  [place], [place], [address] Phone: [phone]   Fax:  [phone]  Name: MARCIANNE SWANICK MRN: [id number] Date of Birth: 13-Nov-1945

## 2016-07-03 ENCOUNTER — Ambulatory Visit: Payer: Medicare Other | Admitting: Physical Therapy

## 2016-07-03 ENCOUNTER — Encounter: Payer: Self-pay | Admitting: Physical Therapy

## 2016-07-03 DIAGNOSIS — R262 Difficulty in walking, not elsewhere classified: Secondary | ICD-10-CM

## 2016-07-03 DIAGNOSIS — M25561 Pain in right knee: Secondary | ICD-10-CM

## 2016-07-03 DIAGNOSIS — M25661 Stiffness of right knee, not elsewhere classified: Secondary | ICD-10-CM

## 2016-07-03 DIAGNOSIS — R2241 Localized swelling, mass and lump, right lower limb: Secondary | ICD-10-CM

## 2016-07-03 NOTE — Therapy (Signed)
[facility] [address] [place], [place], [address] Phone: [phone]   Fax:  [phone]  Physical Therapy Treatment  Patient Details  Name: Kristine Olson MRN: [id number] Date of Birth: 03-Feb-1946 Referring Provider: Wynelle Link  Encounter Date: 07/03/2016      PT End of Session - 07/03/16 1145    Visit Number 10   Date for PT Re-Evaluation 08/12/16   PT Start Time 1107   PT Stop Time 1200   PT Time Calculation (min) 53 min   Activity Tolerance Patient tolerated treatment well   Behavior During Therapy [facility] for tasks assessed/performed      Past Medical History:  Diagnosis Date  . Arthritis    ostearthritis - right  medial knee  . Depression   . GERD (gastroesophageal reflux disease)    "heartburn daily"-TUMS helpful  . Hx of sinus tachycardia    rarely any problems since metoprolol use  . Hyperlipidemia   . Hypertension   . Hypothyroidism   . Sleep apnea    uses c pap    Past Surgical History:  Procedure Laterality Date  . BIOPSY BREAST     2 biopsies & 2 aspirations  . CESAREAN SECTION    . COLONOSCOPY W/ POLYPECTOMY  multiple since 2007   adenomas, diverticulosis;last 2015  . ESOPHAGOGASTRODUODENOSCOPY (EGD) WITH PROPOFOL N/A 05/29/2016   Procedure: ESOPHAGOGASTRODUODENOSCOPY (EGD) WITH PROPOFOL;  Surgeon: Gatha Mayer, MD;  [facility];  Service: Endoscopy;  Laterality: N/A;  . PARTIAL KNEE ARTHROPLASTY Right 05/22/2016   Procedure: RIGHT KNEE MEDIAL UNICOMPARTMENTAL ARTHROPLASTY;  Surgeon: Gaynelle Arabian, MD;  [facility];  Service: Orthopedics;  Laterality: Right;  . TONSILLECTOMY AND ADENOIDECTOMY    . WISDOM TOOTH EXTRACTION      There were no vitals filed for this visit.      Subjective Assessment - 07/03/16 1108    Subjective "About the same, it was very swollen yesterday, but I did a lot of sitting Saturday"   Currently in Pain? No/denies   Pain Score 0-No pain                          [facility] Adult PT Treatment/Exercise - 07/03/16 0001      Ambulation/Gait   Stairs Yes   Stairs Assistance 5: Supervision   Stair Management Technique One rail Right;Step to pattern;Alternating pattern   Number of Stairs 24   Height of Stairs 6   Gait Comments negotiated three flights of stairs reports anterior R knee pain when controlling descents with RLE. Heavy use of rail when ascending.       Knee/Hip Exercises: Aerobic   Stationary Bike L0 x5 min    Nustep L4 5 min      Knee/Hip Exercises: Machines for Strengthening   Cybex Knee Extension 5lb 2x10   Cybex Knee Flexion Up with both LE down with RLE 2x10 5lb   Cybex Leg Press 30lb 2x15 with blue tband RKE for TKE                  PT Short Term Goals - 06/12/16 [id number]      PT SHORT TERM GOAL #1   Title independent with initial HEP   Time 2   Period Weeks   Status New           PT Long Term Goals - 06/23/16 1103      PT LONG TERM GOAL #  1   Title decrease pain 50%   Status Achieved     PT LONG TERM GOAL #2   Title walk all community distances without device   Status On-going     PT LONG TERM GOAL #3   Title increase AROM of the right knee to 0-115 degrees flexion   Status On-going     PT LONG TERM GOAL #4   Title go up and down stairs reciprocally   Status On-going               Plan - 07/03/16 1146    Clinical Impression Statement Pt completed all of today's activities well. Pt with increase fatigue with stair negotiation. RLE strength is limited with eccentric loads.    Rehab Potential Good   PT Frequency 3x / week   PT Duration 4 weeks   PT Treatment/Interventions ADLs/Self Care Home Management;Cryotherapy;Electrical Stimulation;Functional mobility training;Stair training;Gait training;Therapeutic activities;Therapeutic exercise;Balance training;Passive range of motion;Manual techniques;Vasopneumatic Device   PT Next Visit Plan Eccentric strengthening  of RLE      Patient will benefit from skilled therapeutic intervention in order to improve the following deficits and impairments:  Abnormal gait, Decreased activity tolerance, Decreased balance, Decreased mobility, Decreased strength, Increased edema, Decreased range of motion, Decreased scar mobility, Difficulty walking, Pain  Visit Diagnosis: Pain in right knee  Localized swelling, mass and lump, right lower limb  Stiffness of right knee, not elsewhere classified  Difficulty in walking, not elsewhere classified     Problem List Patient Active Problem List   Diagnosis Date Noted  . Bleeding gastrointestinal 05/29/2016  . Acute pyloric channel ulcer   . OA (osteoarthritis) of knee 05/22/2016  . Physical exam 04/17/2016  . Hemorrhoids 12/03/2015  . Elevated serum creatinine 11/27/2014  . OSA (obstructive sleep apnea) 03/28/2013  . HTN (hypertension) 03/06/2013  . Positional vertigo 10/18/2012  . Fasting hyperglycemia 08/23/2012  . Vitamin D deficiency 08/23/2012  . Hyperlipidemia 07/06/2011  . Osteopenia 07/06/2011  . POSTMENOPAUSAL SYNDROME 03/01/2010  . Esophageal reflux 03/05/2009  . Hypothyroidism 11/20/2007  . IBS 11/20/2007  . TACHYCARDIA, HX OF 11/20/2007  . History of colonic polyps 11/20/2007    Scot Jun 07/03/2016, 11:49 AM  [place], [place], [address] Phone: [phone]   Fax:  [phone]  Name: Kristine Olson MRN: [id number] Date of Birth: 04/19/1946

## 2016-07-05 ENCOUNTER — Encounter: Payer: Self-pay | Admitting: Physical Therapy

## 2016-07-05 ENCOUNTER — Ambulatory Visit: Payer: Medicare Other | Admitting: Physical Therapy

## 2016-07-05 DIAGNOSIS — R2241 Localized swelling, mass and lump, right lower limb: Secondary | ICD-10-CM

## 2016-07-05 DIAGNOSIS — M25661 Stiffness of right knee, not elsewhere classified: Secondary | ICD-10-CM

## 2016-07-05 DIAGNOSIS — M25561 Pain in right knee: Secondary | ICD-10-CM

## 2016-07-05 DIAGNOSIS — R262 Difficulty in walking, not elsewhere classified: Secondary | ICD-10-CM

## 2016-07-05 NOTE — Therapy (Signed)
[place], [place], [address] Phone: [phone]   Fax:  [phone]  Physical Therapy Treatment  Patient Details  Name: Kristine Olson MRN: [id number] Date of Birth: Jun 01, 1946 Referring Provider: Wynelle Link  Encounter Date: 07/05/2016      PT End of Session - 07/05/16 1145    Visit Number 11   Date for PT Re-Evaluation 08/12/16   PT Start Time 1100   PT Stop Time 1155   PT Time Calculation (min) 55 min   Activity Tolerance Patient tolerated treatment well   Behavior During Therapy [facility] for tasks assessed/performed      Past Medical History:  Diagnosis Date  . Arthritis    ostearthritis - right  medial knee  . Depression   . GERD (gastroesophageal reflux disease)    "heartburn daily"-TUMS helpful  . Hx of sinus tachycardia    rarely any problems since metoprolol use  . Hyperlipidemia   . Hypertension   . Hypothyroidism   . Sleep apnea    uses c pap    Past Surgical History:  Procedure Laterality Date  . BIOPSY BREAST     2 biopsies & 2 aspirations  . CESAREAN SECTION    . COLONOSCOPY W/ POLYPECTOMY  multiple since 2007   adenomas, diverticulosis;last 2015  . ESOPHAGOGASTRODUODENOSCOPY (EGD) WITH PROPOFOL N/A 05/29/2016   Procedure: ESOPHAGOGASTRODUODENOSCOPY (EGD) WITH PROPOFOL;  Surgeon: Gatha Mayer, MD;  [facility];  Service: Endoscopy;  Laterality: N/A;  . PARTIAL KNEE ARTHROPLASTY Right 05/22/2016   Procedure: RIGHT KNEE MEDIAL UNICOMPARTMENTAL ARTHROPLASTY;  Surgeon: Gaynelle Arabian, MD;  Location: [facility];  Service: Orthopedics;  Laterality: Right;  . TONSILLECTOMY AND ADENOIDECTOMY    . WISDOM TOOTH EXTRACTION      There were no vitals filed for this visit.      Subjective Assessment - 07/05/16 1107    Subjective "All right"   Currently in Pain? No/denies   Pain Score 0-No pain            [facility] PT Assessment - 07/05/16 0001      AROM   Right  Knee Extension 0   Right Knee Flexion 110     Strength   Overall Strength Comments 4/5                     [facility] Adult PT Treatment/Exercise - 07/05/16 0001      Ambulation/Gait   Stairs Yes   Stairs Assistance 5: Supervision   Stair Management Technique One rail Right;Step to pattern;Alternating pattern   Number of Stairs 24   Height of Stairs 6   Gait Comments negociated three flights of stairs reports anterior R knee pain when controlling desents with RLE.      Knee/Hip Exercises: Aerobic   Stationary Bike L0 x3   Elliptical I-10 R-3 x3 min    Nustep L5 x3 min      Knee/Hip Exercises: Machines for Strengthening   Cybex Leg Press 30lb 2x15 with blue tband RKE for TKE; 20lb RLE only 2x5     Modalities   Modalities Vasopneumatic     Vasopneumatic   Number Minutes Vasopneumatic  15 minutes   Vasopnuematic Location  Knee   Vasopneumatic Pressure Medium;High   Vasopneumatic Temperature  32                  PT Short Term Goals - 06/12/16 2423      PT  SHORT TERM GOAL #1   Title independent with initial HEP   Time 2   Period Weeks   Status New           PT Long Term Goals - 07/05/16 1147      PT LONG TERM GOAL #1   Title decrease pain 50%   Status Achieved     PT LONG TERM GOAL #2   Title walk all community distances without device   Status Partially Met     PT LONG TERM GOAL #3   Title increase AROM of the right knee to 0-115 degrees flexion   Status On-going     PT LONG TERM GOAL #4   Title go up and down stairs reciprocally   Status Partially Met               Plan - 07/05/16 1145    Clinical Impression Statement Pt continues to do well in therapy. Pt does get very fatigue with elliptical and stair negotiation. Pt reports that stairs seem to be getting a little easier.   Rehab Potential Good   PT Frequency 3x / week   PT Duration 4 weeks   PT Treatment/Interventions ADLs/Self Care Home  Management;Cryotherapy;Electrical Stimulation;Functional mobility training;Stair training;Gait training;Therapeutic activities;Therapeutic exercise;Balance training;Passive range of motion;Manual techniques;Vasopneumatic Device   PT Next Visit Plan Eccenric strenghtening of RLE, functional strenghtening      Patient will benefit from skilled therapeutic intervention in order to improve the following deficits and impairments:  Abnormal gait, Decreased activity tolerance, Decreased balance, Decreased mobility, Decreased strength, Increased edema, Decreased range of motion, Decreased scar mobility, Difficulty walking, Pain  Visit Diagnosis: Pain in right knee  Localized swelling, mass and lump, right lower limb  Stiffness of right knee, not elsewhere classified  Difficulty in walking, not elsewhere classified     Problem List Patient Active Problem List   Diagnosis Date Noted  . Bleeding gastrointestinal 05/29/2016  . Acute pyloric channel ulcer   . OA (osteoarthritis) of knee 05/22/2016  . Physical exam 04/17/2016  . Hemorrhoids 12/03/2015  . Elevated serum creatinine 11/27/2014  . OSA (obstructive sleep apnea) 03/28/2013  . HTN (hypertension) 03/06/2013  . Positional vertigo 10/18/2012  . Fasting hyperglycemia 08/23/2012  . Vitamin D deficiency 08/23/2012  . Hyperlipidemia 07/06/2011  . Osteopenia 07/06/2011  . POSTMENOPAUSAL SYNDROME 03/01/2010  . Esophageal reflux 03/05/2009  . Hypothyroidism 11/20/2007  . IBS 11/20/2007  . TACHYCARDIA, HX OF 11/20/2007  . History of colonic polyps 11/20/2007    Scot Jun, PTA  07/05/2016, 11:47 AM  [place], [place], [address] Phone: [phone]   Fax:  [phone]  Name: Kristine Olson MRN: [id number] Date of Birth: 1946-10-18

## 2016-07-10 ENCOUNTER — Encounter: Payer: Self-pay | Admitting: Physical Therapy

## 2016-07-10 ENCOUNTER — Ambulatory Visit: Payer: Medicare Other | Admitting: Physical Therapy

## 2016-07-10 DIAGNOSIS — R2241 Localized swelling, mass and lump, right lower limb: Secondary | ICD-10-CM

## 2016-07-10 DIAGNOSIS — M25561 Pain in right knee: Secondary | ICD-10-CM

## 2016-07-10 DIAGNOSIS — R262 Difficulty in walking, not elsewhere classified: Secondary | ICD-10-CM

## 2016-07-10 DIAGNOSIS — M25661 Stiffness of right knee, not elsewhere classified: Secondary | ICD-10-CM

## 2016-07-10 LAB — HM MAMMOGRAPHY: HM Mammogram: NORMAL (ref 0–4)

## 2016-07-10 NOTE — Therapy (Signed)
[place], [place], [address] Phone: [phone]   Fax:  [phone]  Physical Therapy Treatment  Patient Details  Name: BILAN TEDESCO MRN: [id number] Date of Birth: 04/04/1946 Referring Provider: Wynelle Link  Encounter Date: 07/10/2016      PT End of Session - 07/10/16 1139    Visit Number 12   Date for PT Re-Evaluation 08/12/16   PT Start Time 1101   PT Stop Time 1155   PT Time Calculation (min) 54 min   Activity Tolerance Patient tolerated treatment well   Behavior During Therapy [facility] for tasks assessed/performed      Past Medical History:  Diagnosis Date  . Arthritis    ostearthritis - right  medial knee  . Depression   . GERD (gastroesophageal reflux disease)    "heartburn daily"-TUMS helpful  . Hx of sinus tachycardia    rarely any problems since metoprolol use  . Hyperlipidemia   . Hypertension   . Hypothyroidism   . Sleep apnea    uses c pap    Past Surgical History:  Procedure Laterality Date  . BIOPSY BREAST     2 biopsies & 2 aspirations  . CESAREAN SECTION    . COLONOSCOPY W/ POLYPECTOMY  multiple since 2007   adenomas, diverticulosis;last 2015  . ESOPHAGOGASTRODUODENOSCOPY (EGD) WITH PROPOFOL N/A 05/29/2016   Procedure: ESOPHAGOGASTRODUODENOSCOPY (EGD) WITH PROPOFOL;  Surgeon: Gatha Mayer, MD;  [facility];  Service: Endoscopy;  Laterality: N/A;  . PARTIAL KNEE ARTHROPLASTY Right 05/22/2016   Procedure: RIGHT KNEE MEDIAL UNICOMPARTMENTAL ARTHROPLASTY;  Surgeon: Gaynelle Arabian, MD;  [facility];  Service: Orthopedics;  Laterality: Right;  . TONSILLECTOMY AND ADENOIDECTOMY    . WISDOM TOOTH EXTRACTION      There were no vitals filed for this visit.      Subjective Assessment - 07/10/16 1108    Subjective "Im just tired of it, I want it to be better"   Currently in Pain? No/denies   Pain Score 0-No pain                          [facility] Adult PT Treatment/Exercise - 07/10/16 0001      Knee/Hip Exercises: Aerobic   Stationary Bike L0 x3   Elliptical I-10 R-3 x3 min    Nustep L4 x 4 min      Knee/Hip Exercises: Machines for Strengthening   Cybex Knee Flexion Up with both LE down with RLE 2x10 5lb   Cybex Leg Press 40lb 2x15      Knee/Hip Exercises: Standing   Lateral Step Up Right;1 set;10 reps   Forward Step Up Left;1 set;10 reps     Knee/Hip Exercises: Seated   Long Arc Quad Strengthening;Right;Weights;2 sets   Long Arc Quad Weight 5 lbs.   Sit to Sand 2 sets;10 reps;without UE support  holding yellow ball     Modalities   Modalities Vasopneumatic     Vasopneumatic   Number Minutes Vasopneumatic  15 minutes   Vasopnuematic Location  Knee   Vasopneumatic Pressure Medium;High   Vasopneumatic Temperature  32                  PT Short Term Goals - 06/12/16 6962      PT SHORT TERM GOAL #1   Title independent with initial HEP   Time 2   Period Weeks   Status New  PT Long Term Goals - 07/05/16 1147      PT LONG TERM GOAL #1   Title decrease pain 50%   Status Achieved     PT LONG TERM GOAL #2   Title walk all community distances without device   Status Partially Met     PT LONG TERM GOAL #3   Title increase AROM of the right knee to 0-115 degrees flexion   Status On-going     PT LONG TERM GOAL #4   Title go up and down stairs reciprocally   Status Partially Met               Plan - 07/10/16 1141    Clinical Impression Statement Pt enters clinic with some frustration due to her progress. Pt reports that she is just tired of dealing with her knee. She reports no pain only tightness. Pt stated she is unable to go shopping because she can not stand for long periods of time. Pt able to complete all of today's interventions well. She does fatigue quickly with aerobic activities.   Rehab Potential Good   PT Frequency 3x / week   PT Duration 4 weeks   PT  Treatment/Interventions ADLs/Self Care Home Management;Cryotherapy;Electrical Stimulation;Functional mobility training;Stair training;Gait training;Therapeutic activities;Therapeutic exercise;Balance training;Passive range of motion;Manual techniques;Vasopneumatic Device   PT Next Visit Plan Eccenric strenghtening of RLE, functional strenghtening      Patient will benefit from skilled therapeutic intervention in order to improve the following deficits and impairments:  Abnormal gait, Decreased activity tolerance, Decreased balance, Decreased mobility, Decreased strength, Increased edema, Decreased range of motion, Decreased scar mobility, Difficulty walking, Pain  Visit Diagnosis: Pain in right knee  Localized swelling, mass and lump, right lower limb  Stiffness of right knee, not elsewhere classified  Difficulty in walking, not elsewhere classified     Problem List Patient Active Problem List   Diagnosis Date Noted  . Bleeding gastrointestinal 05/29/2016  . Acute pyloric channel ulcer   . OA (osteoarthritis) of knee 05/22/2016  . Physical exam 04/17/2016  . Hemorrhoids 12/03/2015  . Elevated serum creatinine 11/27/2014  . OSA (obstructive sleep apnea) 03/28/2013  . HTN (hypertension) 03/06/2013  . Positional vertigo 10/18/2012  . Fasting hyperglycemia 08/23/2012  . Vitamin D deficiency 08/23/2012  . Hyperlipidemia 07/06/2011  . Osteopenia 07/06/2011  . POSTMENOPAUSAL SYNDROME 03/01/2010  . Esophageal reflux 03/05/2009  . Hypothyroidism 11/20/2007  . IBS 11/20/2007  . TACHYCARDIA, HX OF 11/20/2007  . History of colonic polyps 11/20/2007    Scot Jun 07/10/2016, 11:47 AM  [place], [place], [address] Phone: [phone]   Fax:  [phone]  Name: SABRINNA YEARWOOD MRN: [id number] Date of Birth: October 13, 1946

## 2016-07-12 ENCOUNTER — Encounter: Payer: Self-pay | Admitting: Physical Therapy

## 2016-07-12 ENCOUNTER — Ambulatory Visit: Payer: Medicare Other | Admitting: Physical Therapy

## 2016-07-12 DIAGNOSIS — M25561 Pain in right knee: Secondary | ICD-10-CM

## 2016-07-12 DIAGNOSIS — R262 Difficulty in walking, not elsewhere classified: Secondary | ICD-10-CM

## 2016-07-12 DIAGNOSIS — M25661 Stiffness of right knee, not elsewhere classified: Secondary | ICD-10-CM

## 2016-07-12 DIAGNOSIS — R2241 Localized swelling, mass and lump, right lower limb: Secondary | ICD-10-CM

## 2016-07-12 NOTE — Therapy (Signed)
[place] [address] [place], [place], [address] Phone: [phone]   Fax:  [phone]  Physical Therapy Treatment  Patient Details  Name: Kristine Olson MRN: [id number] Date of Birth: 1946/08/25 Referring Provider: Wynelle Link  Encounter Date: 07/12/2016      PT End of Session - 07/12/16 1136    Visit Number 13   Date for PT Re-Evaluation 08/12/16   PT Start Time 1100   PT Stop Time 1151   PT Time Calculation (min) 51 min   Activity Tolerance Patient tolerated treatment well   Behavior During Therapy [facility] for tasks assessed/performed      Past Medical History:  Diagnosis Date  . Arthritis    ostearthritis - right  medial knee  . Depression   . GERD (gastroesophageal reflux disease)    "heartburn daily"-TUMS helpful  . Hx of sinus tachycardia    rarely any problems since metoprolol use  . Hyperlipidemia   . Hypertension   . Hypothyroidism   . Sleep apnea    uses c pap    Past Surgical History:  Procedure Laterality Date  . BIOPSY BREAST     2 biopsies & 2 aspirations  . CESAREAN SECTION    . COLONOSCOPY W/ POLYPECTOMY  multiple since 2007   adenomas, diverticulosis;last 2015  . ESOPHAGOGASTRODUODENOSCOPY (EGD) WITH PROPOFOL N/A 05/29/2016   Procedure: ESOPHAGOGASTRODUODENOSCOPY (EGD) WITH PROPOFOL;  Surgeon: Gatha Mayer, MD;  [facility];  Service: Endoscopy;  Laterality: N/A;  . PARTIAL KNEE ARTHROPLASTY Right 05/22/2016   Procedure: RIGHT KNEE MEDIAL UNICOMPARTMENTAL ARTHROPLASTY;  Surgeon: Gaynelle Arabian, MD;  [facility];  Service: Orthopedics;  Laterality: Right;  . TONSILLECTOMY AND ADENOIDECTOMY    . WISDOM TOOTH EXTRACTION      There were no vitals filed for this visit.      Subjective Assessment - 07/12/16 1059    Subjective "Wonderful"   Currently in Pain? No/denies   Pain Score 0-No pain            [facility] PT Assessment - 07/12/16 0001      AROM   Right  Knee Extension 0   Right Knee Flexion 110     Strength   Overall Strength Comments 4+/5                     [facility] Adult PT Treatment/Exercise - 07/12/16 0001      Ambulation/Gait   Stairs Yes   Stairs Assistance 5: Supervision   Stair Management Technique One rail Right;Step to pattern;Alternating pattern   Number of Stairs 24   Height of Stairs 6   Gait Comments negociated three flights of stairs reports anterior R knee pain when controlling desents with RLE.      Knee/Hip Exercises: Aerobic   Stationary Bike L0 x3   Elliptical I-10 R-3 x4 min    Nustep L4 x 3 min      Knee/Hip Exercises: Machines for Strengthening   Cybex Leg Press RLE 20lb 3x5     Knee/Hip Exercises: Seated   Long Arc Quad Strengthening;Right;Weights;2 sets;10 reps   Long Arc Quad Weight 5 lbs.     Modalities   Modalities Cryotherapy     Cryotherapy   Number Minutes Cryotherapy 10 Minutes   Cryotherapy Location Knee   Type of Cryotherapy Ice pack                  PT Short Term Goals -  06/12/16 0922      PT SHORT TERM GOAL #1   Title independent with initial HEP   Time 2   Period Weeks   Status New           PT Long Term Goals - 07/12/16 1144      PT LONG TERM GOAL #1   Title decrease pain 50%   Status Achieved     PT LONG TERM GOAL #2   Title walk all community distances without device   Status Partially Met     PT LONG TERM GOAL #3   Title increase AROM of the right knee to 0-115 degrees flexion   Status On-going     PT LONG TERM GOAL #4   Title go up and down stairs reciprocally   Status Partially Met               Plan - 07/12/16 1142    Clinical Impression Statement Pt continues to perform well in therapy. She gets very fatigue with stair negotiation and requires multiple standing rest breaks. Pt also continue to report and anterior R knee pain with stair descents  but it has improved. AROM remains well.   Rehab Potential Good   PT  Frequency 3x / week   PT Duration 4 weeks   PT Treatment/Interventions ADLs/Self Care Home Management;Cryotherapy;Electrical Stimulation;Functional mobility training;Stair training;Gait training;Therapeutic activities;Therapeutic exercise;Balance training;Passive range of motion;Manual techniques;Vasopneumatic Device   PT Next Visit Plan Eccentric strengthening of RLE, functional endurance      Patient will benefit from skilled therapeutic intervention in order to improve the following deficits and impairments:  Abnormal gait, Decreased activity tolerance, Decreased balance, Decreased mobility, Decreased strength, Increased edema, Decreased range of motion, Decreased scar mobility, Difficulty walking, Pain  Visit Diagnosis: Pain in right knee  Localized swelling, mass and lump, right lower limb  Stiffness of right knee, not elsewhere classified  Difficulty in walking, not elsewhere classified     Problem List Patient Active Problem List   Diagnosis Date Noted  . Bleeding gastrointestinal 05/29/2016  . Acute pyloric channel ulcer   . OA (osteoarthritis) of knee 05/22/2016  . Physical exam 04/17/2016  . Hemorrhoids 12/03/2015  . Elevated serum creatinine 11/27/2014  . OSA (obstructive sleep apnea) 03/28/2013  . HTN (hypertension) 03/06/2013  . Positional vertigo 10/18/2012  . Fasting hyperglycemia 08/23/2012  . Vitamin D deficiency 08/23/2012  . Hyperlipidemia 07/06/2011  . Osteopenia 07/06/2011  . POSTMENOPAUSAL SYNDROME 03/01/2010  . Esophageal reflux 03/05/2009  . Hypothyroidism 11/20/2007  . IBS 11/20/2007  . TACHYCARDIA, HX OF 11/20/2007  . History of colonic polyps 11/20/2007    Scot Jun 07/12/2016, 11:44 AM  [place], [place], [address] Phone: [phone]   Fax:  [phone]  Name: Kristine Olson MRN: [id number] Date of Birth: 11-02-1945

## 2016-07-17 ENCOUNTER — Ambulatory Visit: Payer: Medicare Other | Admitting: Physical Therapy

## 2016-07-17 ENCOUNTER — Encounter: Payer: Self-pay | Admitting: Physical Therapy

## 2016-07-17 DIAGNOSIS — M25561 Pain in right knee: Secondary | ICD-10-CM | POA: Diagnosis not present

## 2016-07-17 DIAGNOSIS — R262 Difficulty in walking, not elsewhere classified: Secondary | ICD-10-CM

## 2016-07-17 DIAGNOSIS — R2241 Localized swelling, mass and lump, right lower limb: Secondary | ICD-10-CM

## 2016-07-17 DIAGNOSIS — M25661 Stiffness of right knee, not elsewhere classified: Secondary | ICD-10-CM

## 2016-07-17 NOTE — Therapy (Signed)
[place] [address] [place], [place], [address] Phone: [phone]   Fax:  [phone]  Physical Therapy Treatment  Patient Details  Name: Kristine Olson MRN: [id number] Date of Birth: 30-Jun-1946 Referring Provider: Wynelle Link  Encounter Date: 07/17/2016      PT End of Session - 07/17/16 1146    Visit Number 14   Date for PT Re-Evaluation 08/12/16   PT Start Time 1100   PT Stop Time 1158   PT Time Calculation (min) 58 min   Activity Tolerance Patient tolerated treatment well   Behavior During Therapy [facility] for tasks assessed/performed      Past Medical History:  Diagnosis Date  . Arthritis    ostearthritis - right  medial knee  . Depression   . GERD (gastroesophageal reflux disease)    "heartburn daily"-TUMS helpful  . Hx of sinus tachycardia    rarely any problems since metoprolol use  . Hyperlipidemia   . Hypertension   . Hypothyroidism   . Sleep apnea    uses c pap    Past Surgical History:  Procedure Laterality Date  . BIOPSY BREAST     2 biopsies & 2 aspirations  . CESAREAN SECTION    . COLONOSCOPY W/ POLYPECTOMY  multiple since 2007   adenomas, diverticulosis;last 2015  . ESOPHAGOGASTRODUODENOSCOPY (EGD) WITH PROPOFOL N/A 05/29/2016   Procedure: ESOPHAGOGASTRODUODENOSCOPY (EGD) WITH PROPOFOL;  Surgeon: Gatha Mayer, MD;  [facility];  Service: Endoscopy;  Laterality: N/A;  . PARTIAL KNEE ARTHROPLASTY Right 05/22/2016   Procedure: RIGHT KNEE MEDIAL UNICOMPARTMENTAL ARTHROPLASTY;  Surgeon: Gaynelle Arabian, MD;  [facility];  Service: Orthopedics;  Laterality: Right;  . TONSILLECTOMY AND ADENOIDECTOMY    . WISDOM TOOTH EXTRACTION      There were no vitals filed for this visit.      Subjective Assessment - 07/17/16 1104    Subjective "Same"   Currently in Pain? No/denies   Pain Score 0-No pain                         [facility] Adult PT Treatment/Exercise -  07/17/16 0001      Ambulation/Gait   Ambulation/Gait Yes   Ambulation/Gait Assistance 6: Modified independent (Device/Increase time);5: Supervision   Ambulation Distance (Feet) --  > 250   Assistive device None   Gait Pattern Step-through pattern;Lateral trunk lean to left   Ambulation Surface Unlevel;Outdoor;Paved   Stairs Yes   Stairs Assistance 5: Supervision   Stair Management Technique One rail Right;Step to pattern;Alternating pattern   Number of Stairs 24   Height of Stairs 6   Gait Comments negotiated one flights of stairs and ambulated around building. Pt with increase cardiovascular fatigue. Pt requires multiple standing rest breaks dur to fatigue not knee. Pt with a lateral lean to the R with ambulation  that she reports is due to her other knee needing a replacement.     Knee/Hip Exercises: Aerobic   Elliptical I-10 R-5  x5 min    Nustep L4 x 3 min   No UE      Knee/Hip Exercises: Machines for Strengthening   Cybex Knee Flexion Up with both LE down with RLE 2x10 5lb   Cybex Leg Press RLE 20lb 3x5     Knee/Hip Exercises: Seated   Long Arc Quad Strengthening;Right;Weights;2 sets;10 reps   Long Arc Quad Weight 5 lbs.     Modalities   Modalities Vasopneumatic  Vasopneumatic   Number Minutes Vasopneumatic  15 minutes   Vasopnuematic Location  Knee   Vasopneumatic Pressure Medium;High   Vasopneumatic Temperature  32                  PT Short Term Goals - 06/12/16 8676      PT SHORT TERM GOAL #1   Title independent with initial HEP   Time 2   Period Weeks   Status New           PT Long Term Goals - 07/12/16 1144      PT LONG TERM GOAL #1   Title decrease pain 50%   Status Achieved     PT LONG TERM GOAL #2   Title walk all community distances without device   Status Partially Met     PT LONG TERM GOAL #3   Title increase AROM of the right knee to 0-115 degrees flexion   Status On-going     PT LONG TERM GOAL #4   Title go up and  down stairs reciprocally   Status Partially Met               Plan - 07/17/16 1147    Clinical Impression Statement Pt with decrease functional endurance requiring multiple rest breaks with outdoor ambulation and stair negotiation. Assist required to initiate movement with single leg on leg press.   Rehab Potential Good   PT Frequency 3x / week   PT Duration 4 weeks   PT Treatment/Interventions ADLs/Self Care Home Management;Cryotherapy;Electrical Stimulation;Functional mobility training;Stair training;Gait training;Therapeutic activities;Therapeutic exercise;Balance training;Passive range of motion;Manual techniques;Vasopneumatic Device   PT Next Visit Plan Eccentric strengthening of RLE, functional endurance.      Patient will benefit from skilled therapeutic intervention in order to improve the following deficits and impairments:  Abnormal gait, Decreased activity tolerance, Decreased balance, Decreased mobility, Decreased strength, Increased edema, Decreased range of motion, Decreased scar mobility, Difficulty walking, Pain  Visit Diagnosis: Pain in right knee  Localized swelling, mass and lump, right lower limb  Stiffness of right knee, not elsewhere classified  Difficulty in walking, not elsewhere classified     Problem List Patient Active Problem List   Diagnosis Date Noted  . Bleeding gastrointestinal 05/29/2016  . Acute pyloric channel ulcer   . OA (osteoarthritis) of knee 05/22/2016  . Physical exam 04/17/2016  . Hemorrhoids 12/03/2015  . Elevated serum creatinine 11/27/2014  . OSA (obstructive sleep apnea) 03/28/2013  . HTN (hypertension) 03/06/2013  . Positional vertigo 10/18/2012  . Fasting hyperglycemia 08/23/2012  . Vitamin D deficiency 08/23/2012  . Hyperlipidemia 07/06/2011  . Osteopenia 07/06/2011  . POSTMENOPAUSAL SYNDROME 03/01/2010  . Esophageal reflux 03/05/2009  . Hypothyroidism 11/20/2007  . IBS 11/20/2007  . TACHYCARDIA, HX OF  11/20/2007  . History of colonic polyps 11/20/2007    Scot Jun 07/17/2016, 11:50 AM  [address] [place], [place], [address] Phone: [phone]   Fax:  [phone]  Name: Kristine Olson MRN: [id number] Date of Birth: 09/29/1946

## 2016-07-19 ENCOUNTER — Encounter: Payer: Self-pay | Admitting: Physical Therapy

## 2016-07-19 ENCOUNTER — Ambulatory Visit: Payer: Medicare Other | Admitting: Physical Therapy

## 2016-07-19 DIAGNOSIS — M25561 Pain in right knee: Secondary | ICD-10-CM | POA: Diagnosis not present

## 2016-07-19 DIAGNOSIS — R262 Difficulty in walking, not elsewhere classified: Secondary | ICD-10-CM

## 2016-07-19 DIAGNOSIS — R2241 Localized swelling, mass and lump, right lower limb: Secondary | ICD-10-CM

## 2016-07-19 DIAGNOSIS — M25661 Stiffness of right knee, not elsewhere classified: Secondary | ICD-10-CM

## 2016-07-19 NOTE — Therapy (Signed)
[facility] [address] [place], [place], [address] Phone: [phone]   Fax:  [phone]  Physical Therapy Treatment  Patient Details  Name: Kristine Olson MRN: [id number] Date of Birth: 01/18/1946 Referring Provider: Aluisio  Encounter Date: 07/19/2016      PT End of Session - 07/19/16 1141    Visit Number 15   Date for PT Re-Evaluation 08/12/16   PT Start Time 1102   PT Stop Time 1158   PT Time Calculation (min) 56 min   Activity Tolerance Patient tolerated treatment well   Behavior During Therapy WFL for tasks assessed/performed      Past Medical History:  Diagnosis Date  . Arthritis    ostearthritis - right  medial knee  . Depression   . GERD (gastroesophageal reflux disease)    "heartburn daily"-TUMS helpful  . Hx of sinus tachycardia    rarely any problems since metoprolol use  . Hyperlipidemia   . Hypertension   . Hypothyroidism   . Sleep apnea    uses c pap    Past Surgical History:  Procedure Laterality Date  . BIOPSY BREAST     2 biopsies & 2 aspirations  . CESAREAN SECTION    . COLONOSCOPY W/ POLYPECTOMY  multiple since 2007   adenomas, diverticulosis;last 2015  . ESOPHAGOGASTRODUODENOSCOPY (EGD) WITH PROPOFOL N/A 05/29/2016   Procedure: ESOPHAGOGASTRODUODENOSCOPY (EGD) WITH PROPOFOL;  Surgeon: Carl E Gessner, MD;  Location: [facility];  Service: Endoscopy;  Laterality: N/A;  . PARTIAL KNEE ARTHROPLASTY Right 05/22/2016   Procedure: RIGHT KNEE MEDIAL UNICOMPARTMENTAL ARTHROPLASTY;  Surgeon: Frank Aluisio, MD;  Location: [facility];  Service: Orthopedics;  Laterality: Right;  . TONSILLECTOMY AND ADENOIDECTOMY    . WISDOM TOOTH EXTRACTION      There were no vitals filed for this visit.      Subjective Assessment - 07/19/16 1105    Subjective "Your not going to believe this but I think the swelling has gown down some today" "I can see a difference"   Currently in Pain? No/denies    Pain Score 0-No pain                         [facility] Adult PT Treatment/Exercise - 07/19/16 0001      Ambulation/Gait   Gait Comments negotiated two flights of stairs and ambulated Down hallway and back ~120 ft with limited rest breaks allowed. Pt with increase cardiovascular fatigue. Pt reports some low back pain     Knee/Hip Exercises: Aerobic   Elliptical I-10 R-5  x5 min    Nustep L4 x 4 min      Knee/Hip Exercises: Machines for Strengthening   Cybex Knee Flexion Up with both LE down with RLE 2x10 5lb   Cybex Leg Press 40lb 2x10     Knee/Hip Exercises: Standing   Lateral Step Up Right;10 reps;2 sets  3lb dumbbells      Knee/Hip Exercises: Seated   Sit to Sand 2 sets;10 reps;without UE support  holding yellow ball     Modalities   Modalities Vasopneumatic     Vasopneumatic   Number Minutes Vasopneumatic  15 minutes   Vasopnuematic Location  Knee   Vasopneumatic Pressure Medium;High   Vasopneumatic Temperature  32                  PT Short Term Goals - 06/12/16 0922      PT SHORT TERM GOAL #  1   Title independent with initial HEP   Time 2   Period Weeks   Status New           PT Long Term Goals - 07/12/16 1144      PT LONG TERM GOAL #1   Title decrease pain 50%   Status Achieved     PT LONG TERM GOAL #2   Title walk all community distances without device   Status Partially Met     PT LONG TERM GOAL #3   Title increase AROM of the right knee to 0-115 degrees flexion   Status On-going     PT LONG TERM GOAL #4   Title go up and down stairs reciprocally   Status Partially Met               Plan - 07/19/16 1142    Clinical Impression Statement Pt enters clinic expressing that she is having issues with her endurance when out shopping. Pt reports that she is unable to stand for long periods of time. decrease functional endurance remain in this treatment session. Pt with heavy breathing on elliptical and performing   functional interventions requiring frequent rest breaks.  Pt reports increase low back pain that she reports has been an ongoing issue since before surgery.    Rehab Potential Good   PT Frequency 3x / week   PT Duration 4 weeks   PT Treatment/Interventions ADLs/Self Care Home Management;Cryotherapy;Electrical Stimulation;Functional mobility training;Stair training;Gait training;Therapeutic activities;Therapeutic exercise;Balance training;Passive range of motion;Manual techniques;Vasopneumatic Device   PT Next Visit Plan Eccentric strengthening of RLE, functional endurance.      Patient will benefit from skilled therapeutic intervention in order to improve the following deficits and impairments:  Abnormal gait, Decreased activity tolerance, Decreased balance, Decreased mobility, Decreased strength, Increased edema, Decreased range of motion, Decreased scar mobility, Difficulty walking, Pain  Visit Diagnosis: Pain in right knee  Localized swelling, mass and lump, right lower limb  Stiffness of right knee, not elsewhere classified  Difficulty in walking, not elsewhere classified     Problem List Patient Active Problem List   Diagnosis Date Noted  . Bleeding gastrointestinal 05/29/2016  . Acute pyloric channel ulcer   . OA (osteoarthritis) of knee 05/22/2016  . Physical exam 04/17/2016  . Hemorrhoids 12/03/2015  . Elevated serum creatinine 11/27/2014  . OSA (obstructive sleep apnea) 03/28/2013  . HTN (hypertension) 03/06/2013  . Positional vertigo 10/18/2012  . Fasting hyperglycemia 08/23/2012  . Vitamin D deficiency 08/23/2012  . Hyperlipidemia 07/06/2011  . Osteopenia 07/06/2011  . POSTMENOPAUSAL SYNDROME 03/01/2010  . Esophageal reflux 03/05/2009  . Hypothyroidism 11/20/2007  . IBS 11/20/2007  . TACHYCARDIA, HX OF 11/20/2007  . History of colonic polyps 11/20/2007    Ronald G Pemberton 07/19/2016, 11:46 AM  [facility] [address] [place], [place], [address] Phone: [phone]   Fax:  [phone]  Name: Tanea H Lory MRN: [id number] Date of Birth: 07/05/1946    

## 2016-07-24 ENCOUNTER — Telehealth: Payer: Self-pay | Admitting: Internal Medicine

## 2016-07-24 ENCOUNTER — Encounter: Payer: Self-pay | Admitting: Physical Therapy

## 2016-07-24 ENCOUNTER — Ambulatory Visit: Payer: Medicare Other | Attending: Orthopedic Surgery | Admitting: Physical Therapy

## 2016-07-24 DIAGNOSIS — M25561 Pain in right knee: Secondary | ICD-10-CM | POA: Insufficient documentation

## 2016-07-24 DIAGNOSIS — R262 Difficulty in walking, not elsewhere classified: Secondary | ICD-10-CM | POA: Diagnosis present

## 2016-07-24 DIAGNOSIS — M25661 Stiffness of right knee, not elsewhere classified: Secondary | ICD-10-CM | POA: Insufficient documentation

## 2016-07-24 DIAGNOSIS — R2241 Localized swelling, mass and lump, right lower limb: Secondary | ICD-10-CM | POA: Diagnosis present

## 2016-07-24 NOTE — Therapy (Signed)
[employer] [facility] [place], [place], [address] Phone: [phone]   Fax:  [phone]  Physical Therapy Treatment  Patient Details  Name: Kristine Olson MRN: [id number] Date of Birth: 27-Jul-1946 Referring Provider: Wynelle Link  Encounter Date: 07/24/2016      PT End of Session - 07/24/16 1100    Visit Number 16   PT Start Time [id number]   PT Stop Time 1100   PT Time Calculation (min) 28 min   Activity Tolerance Patient tolerated treatment well   Behavior During Therapy [facility] for tasks assessed/performed      Past Medical History:  Diagnosis Date  . Arthritis    ostearthritis - right  medial knee  . Depression   . GERD (gastroesophageal reflux disease)    "heartburn daily"-TUMS helpful  . Hx of sinus tachycardia    rarely any problems since metoprolol use  . Hyperlipidemia   . Hypertension   . Hypothyroidism   . Sleep apnea    uses c pap    Past Surgical History:  Procedure Laterality Date  . BIOPSY BREAST     2 biopsies & 2 aspirations  . CESAREAN SECTION    . COLONOSCOPY W/ POLYPECTOMY  multiple since 2007   adenomas, diverticulosis;last 2015  . ESOPHAGOGASTRODUODENOSCOPY (EGD) WITH PROPOFOL N/A 05/29/2016   Procedure: ESOPHAGOGASTRODUODENOSCOPY (EGD) WITH PROPOFOL;  Surgeon: Gatha Mayer, MD;  Location: [facility];  Service: Endoscopy;  Laterality: N/A;  . PARTIAL KNEE ARTHROPLASTY Right 05/22/2016   Procedure: RIGHT KNEE MEDIAL UNICOMPARTMENTAL ARTHROPLASTY;  Surgeon: Gaynelle Arabian, MD;  Location: [facility];  Service: Orthopedics;  Laterality: Right;  . TONSILLECTOMY AND ADENOIDECTOMY    . WISDOM TOOTH EXTRACTION      There were no vitals filed for this visit.      Subjective Assessment - 07/24/16 1033    Subjective "About the same ok"   Currently in Pain? No/denies   Pain Score 0-No pain            [facility] PT Assessment - 07/24/16 0001      AROM   Right Knee Extension 0   Right Knee  Flexion 115                     [facility] Adult PT Treatment/Exercise - 07/24/16 0001      High Level Balance   High Level Balance Comments Standing rebound ball toss on airex 3x10     Knee/Hip Exercises: Aerobic   Elliptical I-10 R-5  x6 min      Knee/Hip Exercises: Machines for Strengthening   Cybex Knee Extension Up with both down with RLE 5lb 2x10   Cybex Knee Flexion 25lb 2x15   Cybex Leg Press 40lb 2x10     Knee/Hip Exercises: Seated   Long Arc Quad Strengthening;Right;Weights;2 sets;10 reps   Long Arc Quad Weight 5 lbs.                  PT Short Term Goals - 06/12/16 [id number]      PT SHORT TERM GOAL #1   Title independent with initial HEP   Time 2   Period Weeks   Status New           PT Long Term Goals - 07/24/16 1104      PT LONG TERM GOAL #3   Title increase AROM of the right knee to 0-115 degrees flexion   Status Achieved  Plan - 07/24/16 1100    Clinical Impression Statement Pt ~17 minutes late for today's treatment. Pt able to complete all interventions week and has completed some LTG's   Rehab Potential Good   PT Frequency 3x / week   PT Duration 4 weeks   PT Next Visit Plan Eccentric strengthening of RLE, functional endurance. D/C next visit      Patient will benefit from skilled therapeutic intervention in order to improve the following deficits and impairments:  Abnormal gait, Decreased activity tolerance, Decreased balance, Decreased mobility, Decreased strength, Increased edema, Decreased range of motion, Decreased scar mobility, Difficulty walking, Pain  Visit Diagnosis: Acute pain of right knee  Localized swelling, mass and lump, right lower limb  Stiffness of right knee, not elsewhere classified  Difficulty in walking, not elsewhere classified     Problem List Patient Active Problem List   Diagnosis Date Noted  . Bleeding gastrointestinal 05/29/2016  . Acute pyloric channel ulcer   . OA  (osteoarthritis) of knee 05/22/2016  . Physical exam 04/17/2016  . Hemorrhoids 12/03/2015  . Elevated serum creatinine 11/27/2014  . OSA (obstructive sleep apnea) 03/28/2013  . HTN (hypertension) 03/06/2013  . Positional vertigo 10/18/2012  . Fasting hyperglycemia 08/23/2012  . Vitamin D deficiency 08/23/2012  . Hyperlipidemia 07/06/2011  . Osteopenia 07/06/2011  . POSTMENOPAUSAL SYNDROME 03/01/2010  . Esophageal reflux 03/05/2009  . Hypothyroidism 11/20/2007  . IBS 11/20/2007  . TACHYCARDIA, HX OF 11/20/2007  . History of colonic polyps 11/20/2007    Scot Jun 07/24/2016, 11:05 AM  [place], [place], [address] Phone: [phone]   Fax:  [phone]  Name: Kristine Olson MRN: [id number] Date of Birth: 1946-01-20

## 2016-07-25 NOTE — Telephone Encounter (Signed)
Please advise Sir, thank you. 

## 2016-07-25 NOTE — Telephone Encounter (Signed)
She should take it for 2 months total and ok to stop UNLESS she is taking NSAID's on daily basis then would need daily low dose PPI

## 2016-07-25 NOTE — Telephone Encounter (Signed)
Informed patient of what Dr Carlean Purl advised.  She has been on the Protonix 2 months now so she will stop it and call us back if she needs refill.

## 2016-07-26 ENCOUNTER — Ambulatory Visit: Payer: Medicare Other | Admitting: Physical Therapy

## 2016-07-26 ENCOUNTER — Encounter: Payer: Self-pay | Admitting: Physical Therapy

## 2016-07-26 DIAGNOSIS — R2241 Localized swelling, mass and lump, right lower limb: Secondary | ICD-10-CM

## 2016-07-26 DIAGNOSIS — M25561 Pain in right knee: Secondary | ICD-10-CM | POA: Diagnosis not present

## 2016-07-26 DIAGNOSIS — R262 Difficulty in walking, not elsewhere classified: Secondary | ICD-10-CM

## 2016-07-26 DIAGNOSIS — M25661 Stiffness of right knee, not elsewhere classified: Secondary | ICD-10-CM

## 2016-07-26 NOTE — Therapy (Addendum)
[place], [place], [address] Phone: [phone]   Fax:  [phone]  Physical Therapy Treatment  Patient Details  Name: Kristine Olson MRN: [id number] Date of Birth: September 23, 1946 Referring Provider: Wynelle Link  Encounter Date: 07/26/2016      PT End of Session - 07/26/16 1151    Visit Number 17   Date for PT Re-Evaluation 08/12/16   PT Start Time 1100   PT Stop Time 1202   PT Time Calculation (min) 62 min   Activity Tolerance Patient tolerated treatment well   Behavior During Therapy [facility] for tasks assessed/performed      Past Medical History:  Diagnosis Date  . Arthritis    ostearthritis - right  medial knee  . Depression   . GERD (gastroesophageal reflux disease)    "heartburn daily"-TUMS helpful  . Hx of sinus tachycardia    rarely any problems since metoprolol use  . Hyperlipidemia   . Hypertension   . Hypothyroidism   . Sleep apnea    uses c pap    Past Surgical History:  Procedure Laterality Date  . BIOPSY BREAST     2 biopsies & 2 aspirations  . CESAREAN SECTION    . COLONOSCOPY W/ POLYPECTOMY  multiple since 2007   adenomas, diverticulosis;last 2015  . ESOPHAGOGASTRODUODENOSCOPY (EGD) WITH PROPOFOL N/A 05/29/2016   Procedure: ESOPHAGOGASTRODUODENOSCOPY (EGD) WITH PROPOFOL;  Surgeon: Gatha Mayer, MD;  Location: [facility];  Service: Endoscopy;  Laterality: N/A;  . PARTIAL KNEE ARTHROPLASTY Right 05/22/2016   Procedure: RIGHT KNEE MEDIAL UNICOMPARTMENTAL ARTHROPLASTY;  Surgeon: Gaynelle Arabian, MD;  Location: [facility];  Service: Orthopedics;  Laterality: Right;  . TONSILLECTOMY AND ADENOIDECTOMY    . WISDOM TOOTH EXTRACTION      There were no vitals filed for this visit.      Subjective Assessment - 07/26/16 1058    Subjective "Fine"   Currently in Pain? No/denies   Pain Score 0-No pain                         [facility] Adult PT Treatment/Exercise -  07/26/16 0001      Ambulation/Gait   Gait Comments Pt negotiated 3 flights of stairs, multiple rest breaks required sue to fatigue. Some use of reil when ascending stairs.     Knee/Hip Exercises: Machines for Strengthening   Cybex Knee Extension Up with both down with RLE 5lb 2x10   Cybex Knee Flexion 25lb 2x15   Cybex Leg Press 40lb 3x10     Knee/Hip Exercises: Standing   Walking with Sports Cord 40lb 4way x5 each     Vasopneumatic   Number Minutes Vasopneumatic  15 minutes   Vasopnuematic Location  Knee   Vasopneumatic Pressure Medium;High   Vasopneumatic Temperature  32                  PT Short Term Goals - 06/12/16 0998      PT SHORT TERM GOAL #1   Title independent with initial HEP   Time 2   Period Weeks   Status New           PT Long Term Goals - 07/26/16 1151      PT LONG TERM GOAL #1   Title decrease pain 50%   Status Achieved     PT LONG TERM GOAL #2   Title walk all community distances without device   Status Achieved  PT LONG TERM GOAL #3   Title increase AROM of the right knee to 0-115 degrees flexion   Status Achieved     PT LONG TERM GOAL #4   Status Partially Met               Plan - 07/26/16 1152    Clinical Impression Statement Pt has progressed completing all LTG's. Pt encouraged to stay active on her own.   Rehab Potential Good   PT Frequency 3x / week   PT Duration 4 weeks   PT Treatment/Interventions ADLs/Self Care Home Management;Cryotherapy;Electrical Stimulation;Functional mobility training;Stair training;Gait training;Therapeutic activities;Therapeutic exercise;Balance training;Passive range of motion;Manual techniques;Vasopneumatic Device   PT Next Visit Plan D/C      Patient will benefit from skilled therapeutic intervention in order to improve the following deficits and impairments:  Abnormal gait, Decreased activity tolerance, Decreased balance, Decreased mobility, Decreased strength, Increased edema,  Decreased range of motion, Decreased scar mobility, Difficulty walking, Pain  Visit Diagnosis: Acute pain of right knee  Localized swelling, mass and lump, right lower limb  Stiffness of right knee, not elsewhere classified  Difficulty in walking, not elsewhere classified     Problem List Patient Active Problem List   Diagnosis Date Noted  . Bleeding gastrointestinal 05/29/2016  . Acute pyloric channel ulcer   . OA (osteoarthritis) of knee 05/22/2016  . Physical exam 04/17/2016  . Hemorrhoids 12/03/2015  . Elevated serum creatinine 11/27/2014  . OSA (obstructive sleep apnea) 03/28/2013  . HTN (hypertension) 03/06/2013  . Positional vertigo 10/18/2012  . Fasting hyperglycemia 08/23/2012  . Vitamin D deficiency 08/23/2012  . Hyperlipidemia 07/06/2011  . Osteopenia 07/06/2011  . POSTMENOPAUSAL SYNDROME 03/01/2010  . Esophageal reflux 03/05/2009  . Hypothyroidism 11/20/2007  . IBS 11/20/2007  . TACHYCARDIA, HX OF 11/20/2007  . History of colonic polyps 11/20/2007    PHYSICAL THERAPY DISCHARGE SUMMARY  Visits from Start of Care: 17 Plan: Patient agrees to discharge.  Patient goals were met. Patient is being discharged due to meeting the stated rehab goals.  ?????        Scot Jun, PTA  07/26/2016, 11:53 AM  [place] [address] [place], [place], [address] Phone: [phone]   Fax:  [phone]  Name: Kristine Olson MRN: [id number] Date of Birth: [birth year]

## 2016-08-29 ENCOUNTER — Telehealth: Payer: Self-pay | Admitting: Internal Medicine

## 2016-08-29 MED ORDER — PANTOPRAZOLE SODIUM 40 MG PO TBEC: 40.0000 mg | DELAYED_RELEASE_TABLET | Freq: Every day | ORAL | 6 refills | Status: DC

## 2016-08-29 NOTE — Telephone Encounter (Signed)
Patient notified that rx sent 

## 2016-08-30 LAB — HM DEXA SCAN

## 2016-09-11 ENCOUNTER — Encounter: Payer: Self-pay | Admitting: General Practice

## 2016-10-09 ENCOUNTER — Ambulatory Visit (INDEPENDENT_AMBULATORY_CARE_PROVIDER_SITE_OTHER): Payer: Medicare Other | Admitting: Family Medicine

## 2016-10-09 ENCOUNTER — Encounter: Payer: Self-pay | Admitting: Family Medicine

## 2016-10-09 VITALS — BP 120/82 | HR 78 | Temp 98.0°F | Resp 16 | Ht 64.0 in | Wt 222.5 lb

## 2016-10-09 DIAGNOSIS — Z23 Encounter for immunization: Secondary | ICD-10-CM

## 2016-10-09 DIAGNOSIS — D509 Iron deficiency anemia, unspecified: Secondary | ICD-10-CM | POA: Diagnosis not present

## 2016-10-09 DIAGNOSIS — E785 Hyperlipidemia, unspecified: Secondary | ICD-10-CM | POA: Diagnosis not present

## 2016-10-09 DIAGNOSIS — I10 Essential (primary) hypertension: Secondary | ICD-10-CM

## 2016-10-09 LAB — LIPID PANEL
CHOL/HDL RATIO: 3
CHOLESTEROL: 177 mg/dL (ref 0–200)
HDL: 65.1 mg/dL (ref >39.00)
LDL Cholesterol: 96 mg/dL (ref 0–99)
NonHDL: 111.86
TRIGLYCERIDES: 81 mg/dL (ref 0.0–149.0)
VLDL: 16.2 mg/dL (ref 0.0–40.0)

## 2016-10-09 LAB — CBC WITH DIFFERENTIAL/PLATELET
Basophils Absolute: 0 10*3/uL (ref 0.0–0.1)
Basophils Relative: 0.5 % (ref 0.0–3.0)
EOS ABS: 0.3 10*3/uL (ref 0.0–0.7)
Eosinophils Relative: 4 % (ref 0.0–5.0)
HCT: 39.8 % (ref 36.0–46.0)
HEMOGLOBIN: 13.3 g/dL (ref 12.0–15.0)
LYMPHS ABS: 1.2 10*3/uL (ref 0.7–4.0)
Lymphocytes Relative: 19 % (ref 12.0–46.0)
MCHC: 33.4 g/dL (ref 30.0–36.0)
MCV: 87.3 fl (ref 78.0–100.0)
MONO ABS: 0.5 10*3/uL (ref 0.1–1.0)
Monocytes Relative: 7.2 % (ref 3.0–12.0)
NEUTROS PCT: 69.3 % (ref 43.0–77.0)
Neutro Abs: 4.4 10*3/uL (ref 1.4–7.7)
Platelets: 207 10*3/uL (ref 150.0–400.0)
RBC: 4.56 Mil/uL (ref 3.87–5.11)
RDW: 14.9 % (ref 11.5–15.5)
WBC: 6.4 10*3/uL (ref 4.0–10.5)

## 2016-10-09 LAB — HEPATIC FUNCTION PANEL
ALBUMIN: 4.1 g/dL (ref 3.5–5.2)
ALT: 12 U/L (ref 0–35)
AST: 15 U/L (ref 0–37)
Alkaline Phosphatase: 58 U/L (ref 39–117)
Bilirubin, Direct: 0.1 mg/dL (ref 0.0–0.3)
TOTAL PROTEIN: 6.7 g/dL (ref 6.0–8.3)
Total Bilirubin: 0.5 mg/dL (ref 0.2–1.2)

## 2016-10-09 LAB — BASIC METABOLIC PANEL
BUN: 21 mg/dL (ref 6–23)
CHLORIDE: 103 meq/L (ref 96–112)
CO2: 28 meq/L (ref 19–32)
Calcium: 9.6 mg/dL (ref 8.4–10.5)
Creatinine, Ser: 1.23 mg/dL — ABNORMAL HIGH (ref 0.40–1.20)
GFR: 46.1 mL/min — ABNORMAL LOW (ref >60.00)
GLUCOSE: 103 mg/dL — AB (ref 70–99)
Potassium: 4.3 mEq/L (ref 3.5–5.1)
Sodium: 140 mEq/L (ref 135–145)

## 2016-10-09 LAB — TSH: TSH: 0.96 u[IU]/mL (ref 0.35–4.50)

## 2016-10-09 NOTE — Assessment & Plan Note (Signed)
Chronic problem.  Well controlled.  Asymptomatic today.  Check labs.  No anticipated med changes.  Will follow.

## 2016-10-09 NOTE — Progress Notes (Signed)
Pre visit review using our clinic review tool, if applicable. No additional management support is needed unless otherwise documented below in the visit note. 

## 2016-10-09 NOTE — Assessment & Plan Note (Signed)
Chronic problem.  Attempting to control w/ diet and exercise but pt admits to slacking on both.  Stressed need for healthy food choices and regular exercise.  Check labs.  Adjust meds prn

## 2016-10-09 NOTE — Progress Notes (Signed)
   Subjective:    Patient ID: Kristine Olson, female    DOB: 01/28/1946, 70 y.o.   MRN: [id number]  HPI HTN- chronic problem, on Metoprolol and Spironolactone w/ good control.  Denies CP, SOB, HAs, visual changes, edema.  Hyperlipidemia- chronic problem.  Currently controlled w/ diet and exercise.  Denies abd pain, N/V, myalgias.  Very limited exercise but plans to start  Anemia- pt had a GI bleed this summer.  Currently asymptomatic.  Will check CBC.   Review of Systems For ROS see HPI     Objective:   Physical Exam  Constitutional: She is oriented to person, place, and time. She appears well-developed and well-nourished. No distress.  HENT:  Head: Normocephalic and atraumatic.  Eyes: Conjunctivae and EOM are normal. Pupils are equal, round, and reactive to light.  Neck: Normal range of motion. Neck supple. No thyromegaly present.  Cardiovascular: Normal rate, regular rhythm, normal heart sounds and intact distal pulses.   No murmur heard. Pulmonary/Chest: Effort normal and breath sounds normal. No respiratory distress.  Abdominal: Soft. She exhibits no distension. There is no tenderness.  Musculoskeletal: She exhibits no edema.  Lymphadenopathy:    She has no cervical adenopathy.  Neurological: She is alert and oriented to person, place, and time.  Skin: Skin is warm and dry.  Psychiatric: She has a normal mood and affect. Her behavior is normal.  Vitals reviewed.         Assessment & Plan:

## 2016-10-09 NOTE — Patient Instructions (Signed)
Schedule your complete physical in 6 months We'll notify you of your lab results and make any changes if needed Try and make healthy food choices and get regular exercise- you can do it! Call with any questions or concerns Happy Holidays!!!!

## 2016-10-09 NOTE — Assessment & Plan Note (Signed)
Due to a GI bleed this summer.  On Iron supplementation.  Check labs.  Adjust tx prn.

## 2016-10-10 ENCOUNTER — Other Ambulatory Visit: Payer: Self-pay | Admitting: Family Medicine

## 2016-10-10 DIAGNOSIS — R7989 Other specified abnormal findings of blood chemistry: Secondary | ICD-10-CM

## 2016-12-01 ENCOUNTER — Other Ambulatory Visit: Payer: Self-pay | Admitting: Family Medicine

## 2016-12-16 ENCOUNTER — Other Ambulatory Visit: Payer: Self-pay | Admitting: Family Medicine

## 2017-01-04 ENCOUNTER — Other Ambulatory Visit (INDEPENDENT_AMBULATORY_CARE_PROVIDER_SITE_OTHER): Payer: Medicare Other

## 2017-01-04 DIAGNOSIS — R7989 Other specified abnormal findings of blood chemistry: Secondary | ICD-10-CM

## 2017-01-04 LAB — BASIC METABOLIC PANEL
BUN: 16 mg/dL (ref 6–23)
CALCIUM: 9.7 mg/dL (ref 8.4–10.5)
CO2: 26 meq/L (ref 19–32)
Chloride: 102 mEq/L (ref 96–112)
Creatinine, Ser: 1.15 mg/dL (ref 0.40–1.20)
GFR: 49.79 mL/min — AB (ref >60.00)
Glucose, Bld: 83 mg/dL (ref 70–99)
Potassium: 3.9 mEq/L (ref 3.5–5.1)
SODIUM: 137 meq/L (ref 135–145)

## 2017-01-16 ENCOUNTER — Other Ambulatory Visit: Payer: Self-pay | Admitting: Internal Medicine

## 2017-02-12 ENCOUNTER — Other Ambulatory Visit: Payer: Self-pay | Admitting: Gastroenterology

## 2017-02-12 MED ORDER — PANTOPRAZOLE SODIUM 40 MG PO TBEC: 40.0000 mg | DELAYED_RELEASE_TABLET | Freq: Every day | ORAL | 0 refills | Status: DC

## 2017-02-12 NOTE — Telephone Encounter (Signed)
Received a fax from pharmacy for refill on pantoprazole, medication refilled for # 90 with 0 refill with a not to pharmacy stating pt needs an appointment haven't been seen since 2015 and that was for a procedure.

## 2017-03-20 ENCOUNTER — Encounter: Payer: Self-pay | Admitting: Physician Assistant

## 2017-03-20 ENCOUNTER — Ambulatory Visit (INDEPENDENT_AMBULATORY_CARE_PROVIDER_SITE_OTHER): Payer: Medicare Other | Admitting: Physician Assistant

## 2017-03-20 VITALS — BP 122/78 | HR 68 | Temp 98.2°F | Resp 14 | Ht 64.0 in | Wt 208.0 lb

## 2017-03-20 DIAGNOSIS — S90862A Insect bite (nonvenomous), left foot, initial encounter: Secondary | ICD-10-CM | POA: Diagnosis not present

## 2017-03-20 DIAGNOSIS — W57XXXA Bitten or stung by nonvenomous insect and other nonvenomous arthropods, initial encounter: Secondary | ICD-10-CM

## 2017-03-20 MED ORDER — HYDROXYZINE HCL 10 MG PO TABS: 10.0000 mg | ORAL_TABLET | Freq: Three times a day (TID) | ORAL | 0 refills | Status: DC | PRN

## 2017-03-20 MED ORDER — DOXYCYCLINE HYCLATE 100 MG PO CAPS: 100.0000 mg | ORAL_CAPSULE | Freq: Two times a day (BID) | ORAL | 0 refills | Status: DC

## 2017-03-20 MED ORDER — TRIAMCINOLONE ACETONIDE 0.1 % EX CREA: 1.0000 "application " | TOPICAL_CREAM | Freq: Two times a day (BID) | CUTANEOUS | 0 refills | Status: DC

## 2017-03-20 NOTE — Progress Notes (Signed)
Patient presents to clinic today c/o ant bites to the left foot with significant pruritus. Patient endorses stepping into an ant hill Mid last week. States she instantly felt pain and realized her foot was covered in fire ants. Noted significant pain and swelling at the time which improved throughout the day. Woke up the next day with significant itching and blisters. States she noted worsening blisters after significant scratching of the foot. Notes some are clearing up while others look infected. Has applied Benadryl ointment without relief. Denies fever, chills, malaise or fatigue.   Past Medical History:  Diagnosis Date  . Arthritis    ostearthritis - right  medial knee  . Depression   . GERD (gastroesophageal reflux disease)    "heartburn daily"-TUMS helpful  . Hx of sinus tachycardia    rarely any problems since metoprolol use  . Hyperlipidemia   . Hypertension   . Hypothyroidism   . Sleep apnea    uses c pap    Current Outpatient Prescriptions on File Prior to Visit  Medication Sig Dispense Refill  . acetaminophen (TYLENOL) 500 MG tablet Take 500-1,000 mg by mouth every 6 (six) hours as needed for mild pain, moderate pain, fever or headache.    . cetirizine (ZYRTEC) 10 MG tablet Take 10 mg by mouth daily as needed for allergies.    . Cholecalciferol (VITAMIN D) 2000 units tablet Take 1,000 Units by mouth daily.     . citalopram (CELEXA) 20 MG tablet Take 20 mg by mouth at bedtime.     Marland Kitchen levothyroxine (SYNTHROID, LEVOTHROID) 112 MCG tablet TAKE 1 TABLET DAILY EXCEPT FOR 1 AND 1/2 TABLETS ON WEDNESDAY (Patient taking differently: Take 112-168 mcg by mouth daily with breakfast. TAKE 1 TABLET DAILY EXCEPT FOR 1 AND 1/2 TABLETS ON WEDNESDAY) 97 tablet 6  . metoprolol tartrate (LOPRESSOR) 25 MG tablet TAKE 1/2 TABLET BY MOUTH TWICE A DAY 90 tablet 1  . pantoprazole (PROTONIX) 40 MG tablet Take 1 tablet (40 mg total) by mouth daily. Need appointment 90 tablet 0  . spironolactone  (ALDACTONE) 25 MG tablet TAKE 1 TABLET (25 MG TOTAL) BY MOUTH DAILY. 90 tablet 1  . Ferrous Sulfate (IRON) 325 (65 Fe) MG TABS Take 650 mg by mouth daily.     No current facility-administered medications on file prior to visit.     Allergies  Allergen Reactions  . Cefaclor Rash    Because of a history of documented adverse serious drug reaction;Medi Alert bracelet  is recommended  . Penicillins     REACTION: rash Because of a history of documented adverse serious drug reaction;Medi Alert bracelet  is recommended Has patient had a PCN reaction causing immediate rash, facial/tongue/throat swelling, SOB or lightheadedness with hypotension: no Has patient had a PCN reaction causing severe rash involving mucus membranes or skin necrosis: {no Has patient had a PCN reaction that required hospitalization no Has patient had a PCN reaction occurring within the last 10 years: no If all of the above ans  . Sulfa Antibiotics Rash    01/22/13 after 9 days of Sulfa Because of a history of documented adverse serious drug reaction;Medi Alert bracelet  is recommended  . Advair Diskus [Fluticasone-Salmeterol]     "Felt weird" and flushing.    Family History  Problem Relation Age of Onset  . Osteoporosis Mother   . Thyroid disease Mother   . Stroke Maternal Aunt         X 3   . Diabetes Maternal  Aunt   . Heart attack Maternal Grandfather        > 70  . Melanoma Brother   . Multiple sclerosis Daughter 49  . Deep vein thrombosis Paternal Aunt   . Other Paternal Aunt        Phlebitis without deep venous thrombosis  . Colon cancer Neg Hx     Social History   Social History  . Marital status: Married    Spouse name: N/A  . Number of children: N/A  . Years of education: N/A   Social History Main Topics  . Smoking status: Never Smoker  . Smokeless tobacco: Never Used  . Alcohol use 0.0 oz/week     Comment:  very rarely  . Drug use: No  . Sexual activity: Yes   Other Topics Concern  .  None   Social History Narrative  . None   Review of Systems - See HPI.  All other ROS are negative.  BP 122/78   Pulse 68   Temp 98.2 F (36.8 C) (Oral)   Resp 14   Ht 5\' 4"  (1.626 m)   Wt 208 lb (94.3 kg)   SpO2 96%   BMI 35.70 kg/m   Physical Exam  Constitutional: She is oriented to person, place, and time and well-developed, well-nourished, and in no distress.  HENT:  Head: Normocephalic and atraumatic.  Eyes: Conjunctivae are normal.  Neck: Neck supple.  Cardiovascular: Normal rate, regular rhythm, normal heart sounds and intact distal pulses.   Pulmonary/Chest: Effort normal and breath sounds normal. No respiratory distress. She has no wheezes. She has no rales. She exhibits no tenderness.  Neurological: She is alert and oriented to person, place, and time.  Skin: Skin is warm and dry.     Psychiatric: Affect normal.  Vitals reviewed.   Recent Results (from the past 2160 hour(s))  Basic metabolic panel     Status: Abnormal   Collection Time: 01/04/17  1:26 PM  Result Value Ref Range   Sodium 137 135 - 145 mEq/L   Potassium 3.9 3.5 - 5.1 mEq/L   Chloride 102 96 - 112 mEq/L   CO2 26 19 - 32 mEq/L   Glucose, Bld 83 70 - 99 mg/dL   BUN 16 6 - 23 mg/dL   Creatinine, Ser 1.15 0.40 - 1.20 mg/dL   Calcium 9.7 8.4 - 10.5 mg/dL   GFR 49.79 (L) >60.00 mL/min   Assessment/Plan: 1. Insect bite, initial encounter Fire ants with typical localized reaction. Discussed avoidance of scratching. Cold compresses and Sarna lotion recommended. Rx Kenalog. Rx hydroxyzine 10 mg. Will add-on Doxycycline due to concern of developing infection 2/2 excoriation. Return precautions discussed with patient.   - triamcinolone cream (KENALOG) 0.1 %; Apply 1 application topically 2 (two) times daily.  Dispense: 30 g; Refill: 0 - doxycycline (VIBRAMYCIN) 100 MG capsule; Take 1 capsule (100 mg total) by mouth 2 (two) times daily.  Dispense: 14 capsule; Refill: 0 - hydrOXYzine (ATARAX/VISTARIL)  10 MG tablet; Take 1 tablet (10 mg total) by mouth 3 (three) times daily as needed.  Dispense: 30 tablet; Refill: 0    Leeanne Rio, [place]

## 2017-03-20 NOTE — Patient Instructions (Addendum)
Please keep skin clean and dry.  Avoid scratching the area as this can cause infection.  Take the antibiotic as directed.  Apply the steroid ointment to the itching, surrounding skin. Take the hydroxyzine as directed. Do not drive while taking this medication as it may make you sleepy.  Follow-up if symptoms are not improving. Blisters should resolve over the next week.

## 2017-03-20 NOTE — Progress Notes (Signed)
Pre visit review using our clinic review tool, if applicable. No additional management support is needed unless otherwise documented below in the visit note. 

## 2017-03-27 ENCOUNTER — Encounter: Payer: Self-pay | Admitting: Family Medicine

## 2017-03-27 NOTE — Telephone Encounter (Signed)
Spoke with patient regarding blisters. Patient states her foot looks and feels better, swelling and itching have resolved. Concerned about blisters, inhibiting her from wearing shoes. Encouraged to keep blisters clean and dry, avoid scratching or popping. Advised blisters may take a week to heal and to call for appointment if they seem to get worse or have signs of infection. Patient verbalized understanding.

## 2017-04-17 ENCOUNTER — Encounter: Payer: Medicare Other | Admitting: Family Medicine

## 2017-04-20 ENCOUNTER — Ambulatory Visit (INDEPENDENT_AMBULATORY_CARE_PROVIDER_SITE_OTHER): Payer: Medicare Other | Admitting: Family Medicine

## 2017-04-20 ENCOUNTER — Encounter: Payer: Self-pay | Admitting: Family Medicine

## 2017-04-20 VITALS — BP 123/73 | HR 73 | Temp 98.3°F | Resp 16 | Ht 64.0 in | Wt 212.4 lb

## 2017-04-20 DIAGNOSIS — E038 Other specified hypothyroidism: Secondary | ICD-10-CM | POA: Diagnosis not present

## 2017-04-20 DIAGNOSIS — Z23 Encounter for immunization: Secondary | ICD-10-CM

## 2017-04-20 DIAGNOSIS — E559 Vitamin D deficiency, unspecified: Secondary | ICD-10-CM | POA: Diagnosis not present

## 2017-04-20 DIAGNOSIS — I10 Essential (primary) hypertension: Secondary | ICD-10-CM | POA: Diagnosis not present

## 2017-04-20 DIAGNOSIS — Z0000 Encounter for general adult medical examination without abnormal findings: Secondary | ICD-10-CM | POA: Diagnosis not present

## 2017-04-20 LAB — BASIC METABOLIC PANEL
BUN: 14 mg/dL (ref 6–23)
CHLORIDE: 102 meq/L (ref 96–112)
CO2: 31 mEq/L (ref 19–32)
Calcium: 9.7 mg/dL (ref 8.4–10.5)
Creatinine, Ser: 1.11 mg/dL (ref 0.40–1.20)
GFR: 51.82 mL/min — ABNORMAL LOW (ref >60.00)
GLUCOSE: 83 mg/dL (ref 70–99)
POTASSIUM: 4.7 meq/L (ref 3.5–5.1)
SODIUM: 140 meq/L (ref 135–145)

## 2017-04-20 LAB — CBC WITH DIFFERENTIAL/PLATELET
Basophils Absolute: 0.1 10*3/uL (ref 0.0–0.1)
Basophils Relative: 0.8 % (ref 0.0–3.0)
EOS PCT: 4 % (ref 0.0–5.0)
Eosinophils Absolute: 0.3 10*3/uL (ref 0.0–0.7)
HCT: 39.8 % (ref 36.0–46.0)
Hemoglobin: 13.3 g/dL (ref 12.0–15.0)
LYMPHS ABS: 1.8 10*3/uL (ref 0.7–4.0)
Lymphocytes Relative: 23.5 % (ref 12.0–46.0)
MCHC: 33.5 g/dL (ref 30.0–36.0)
MCV: 89.8 fl (ref 78.0–100.0)
MONO ABS: 0.6 10*3/uL (ref 0.1–1.0)
MONOS PCT: 8 % (ref 3.0–12.0)
NEUTROS ABS: 5 10*3/uL (ref 1.4–7.7)
NEUTROS PCT: 63.7 % (ref 43.0–77.0)
PLATELETS: 210 10*3/uL (ref 150.0–400.0)
RBC: 4.43 Mil/uL (ref 3.87–5.11)
RDW: 14.2 % (ref 11.5–15.5)
WBC: 7.8 10*3/uL (ref 4.0–10.5)

## 2017-04-20 LAB — LIPID PANEL
CHOL/HDL RATIO: 3
CHOLESTEROL: 207 mg/dL — AB (ref 0–200)
HDL: 68.5 mg/dL (ref >39.00)
LDL Cholesterol: 117 mg/dL — ABNORMAL HIGH (ref 0–99)
NonHDL: 138.28
TRIGLYCERIDES: 107 mg/dL (ref 0.0–149.0)
VLDL: 21.4 mg/dL (ref 0.0–40.0)

## 2017-04-20 LAB — HEPATIC FUNCTION PANEL
ALBUMIN: 4.2 g/dL (ref 3.5–5.2)
ALK PHOS: 66 U/L (ref 39–117)
ALT: 15 U/L (ref 0–35)
AST: 17 U/L (ref 0–37)
BILIRUBIN DIRECT: 0.1 mg/dL (ref 0.0–0.3)
Total Bilirubin: 0.6 mg/dL (ref 0.2–1.2)
Total Protein: 7.4 g/dL (ref 6.0–8.3)

## 2017-04-20 LAB — VITAMIN D 25 HYDROXY (VIT D DEFICIENCY, FRACTURES): VITD: 22.42 ng/mL — AB (ref 30.00–100.00)

## 2017-04-20 LAB — TSH: TSH: 0.73 u[IU]/mL (ref 0.35–4.50)

## 2017-04-20 NOTE — Assessment & Plan Note (Signed)
Chronic problem.  Adequate control.  Asymptomatic.  Check labs.  No anticipated med changes 

## 2017-04-20 NOTE — Assessment & Plan Note (Signed)
Chronic problem.  Currently asymptomatic.  Check labs.  Adjust meds prn  

## 2017-04-20 NOTE — Assessment & Plan Note (Signed)
Check labs.  Replete prn. 

## 2017-04-20 NOTE — Progress Notes (Signed)
   Subjective:    Patient ID: Kristine Olson, female    DOB: 04/18/1946, 71 y.o.   MRN: [id number]  HPI CPE- UTD on colonoscopy, mammo, DEXA.  Due for Pneumovax.  Asking for labs to be faxed to [facility] [phone]   Review of Systems Patient reports no vision/ hearing changes, adenopathy,fever, weight change,  persistant/recurrent hoarseness , swallowing issues, chest pain, palpitations, edema, persistant/recurrent cough, hemoptysis, dyspnea (rest/exertional/paroxysmal nocturnal), gastrointestinal bleeding (melena, rectal bleeding), abdominal pain, significant heartburn, bowel changes, GU symptoms (dysuria, hematuria, incontinence), Gyn symptoms (abnormal  bleeding, pain),  syncope, focal weakness, memory loss, numbness & tingling, skin/hair/nail changes, abnormal bruising or bleeding, anxiety, or depression.     Objective:   Physical Exam General Appearance:    Alert, cooperative, no distress, appears stated age  Head:    Normocephalic, without obvious abnormality, atraumatic  Eyes:    PERRL, conjunctiva/corneas clear, EOM's intact, fundi    benign, both eyes  Ears:    Normal TM's and external ear canals, both ears  Nose:   Nares normal, septum midline, mucosa normal, no drainage    or sinus tenderness  Throat:   Lips, mucosa, and tongue normal; teeth and gums normal  Neck:   Supple, symmetrical, trachea midline, no adenopathy;    Thyroid: no enlargement/tenderness/nodules  Back:     Symmetric, no curvature, ROM normal, no CVA tenderness  Lungs:     Clear to auscultation bilaterally, respirations unlabored  Chest Wall:    No tenderness or deformity   Heart:    Regular rate and rhythm, S1 and S2 normal, no murmur, rub   or gallop  Breast Exam:    Deferred to GYN  Abdomen:     Soft, non-tender, bowel sounds active all four quadrants,    no masses, no organomegaly  Genitalia:    Deferred to GYN  Rectal:    Extremities:   Extremities normal, atraumatic, no cyanosis or  edema  Pulses:   2+ and symmetric all extremities  Skin:   Skin color, texture, turgor normal, no rashes or lesions  Lymph nodes:   Cervical, supraclavicular, and axillary nodes normal  Neurologic:   CNII-XII intact, normal strength, sensation and reflexes    throughout          Assessment & Plan:

## 2017-04-20 NOTE — Assessment & Plan Note (Signed)
Pt's PE WNL.  UTD on colonoscopy, pap, DEXA.  Check labs.  Anticipatory guidance provided.

## 2017-04-20 NOTE — Patient Instructions (Addendum)
Follow up in 6 months to recheck BP We'll notify you of your lab results and make any changes if needed Continue to work on healthy diet and regular exercise- you can do it! Add daily calcium Call with any questions or concerns Happy 4th of July!!!  Bring a copy of your advance directives to your next office visit.  Continue doing brain stimulating activities (puzzles, reading, adult coloring books, staying active) to keep memory sharp.   Health Maintenance, Female Adopting a healthy lifestyle and getting preventive care can go a long way to promote health and wellness. Talk with your health care provider about what schedule of regular examinations is right for you. This is a good chance for you to check in with your provider about disease prevention and staying healthy. In between checkups, there are plenty of things you can do on your own. Experts have done a lot of research about which lifestyle changes and preventive measures are most likely to keep you healthy. Ask your health care provider for more information. Weight and diet Eat a healthy diet  Be sure to include plenty of vegetables, fruits, low-fat dairy products, and lean protein.  Do not eat a lot of foods high in solid fats, added sugars, or salt.  Get regular exercise. This is one of the most important things you can do for your health. ? Most adults should exercise for at least 150 minutes each week. The exercise should increase your heart rate and make you sweat (moderate-intensity exercise). ? Most adults should also do strengthening exercises at least twice a week. This is in addition to the moderate-intensity exercise.  Maintain a healthy weight  Body mass index (BMI) is a measurement that can be used to identify possible weight problems. It estimates body fat based on height and weight. Your health care provider can help determine your BMI and help you achieve or maintain a healthy weight.  For females 63 years of age  and older: ? A BMI below 18.5 is considered underweight. ? A BMI of 18.5 to 24.9 is normal. ? A BMI of 25 to 29.9 is considered overweight. ? A BMI of 30 and above is considered obese.  Watch levels of cholesterol and blood lipids  You should start having your blood tested for lipids and cholesterol at 69 years of age, then have this test every 5 years.  You may need to have your cholesterol levels checked more often if: ? Your lipid or cholesterol levels are high. ? You are older than 69 years of age. ? You are at high risk for heart disease.  Cancer screening Lung Cancer  Lung cancer screening is recommended for adults 10-22 years old who are at high risk for lung cancer because of a history of smoking.  A yearly low-dose CT scan of the lungs is recommended for people who: ? Currently smoke. ? Have quit within the past 15 years. ? Have at least a 30-pack-year history of smoking. A pack year is smoking an average of one pack of cigarettes a day for 1 year.  Yearly screening should continue until it has been 15 years since you quit.  Yearly screening should stop if you develop a health problem that would prevent you from having lung cancer treatment.  Breast Cancer  Practice breast self-awareness. This means understanding how your breasts normally appear and feel.  It also means doing regular breast self-exams. Let your health care provider know about any changes, no matter how small.  If you are in your 20s or 30s, you should have a clinical breast exam (CBE) by a health care provider every 1-3 years as part of a regular health exam.  If you are 15 or older, have a CBE every year. Also consider having a breast X-ray (mammogram) every year.  If you have a family history of breast cancer, talk to your health care provider about genetic screening.  If you are at high risk for breast cancer, talk to your health care provider about having an MRI and a mammogram every  year.  Breast cancer gene (BRCA) assessment is recommended for women who have family members with BRCA-related cancers. BRCA-related cancers include: ? Breast. ? Ovarian. ? Tubal. ? Peritoneal cancers.  Results of the assessment will determine the need for genetic counseling and BRCA1 and BRCA2 testing.  Cervical Cancer Your health care provider may recommend that you be screened regularly for cancer of the pelvic organs (ovaries, uterus, and vagina). This screening involves a pelvic examination, including checking for microscopic changes to the surface of your cervix (Pap test). You may be encouraged to have this screening done every 3 years, beginning at age 61.  For women ages 7-65, health care providers may recommend pelvic exams and Pap testing every 3 years, or they may recommend the Pap and pelvic exam, combined with testing for human papilloma virus (HPV), every 5 years. Some types of HPV increase your risk of cervical cancer. Testing for HPV may also be done on women of any age with unclear Pap test results.  Other health care providers may not recommend any screening for nonpregnant women who are considered low risk for pelvic cancer and who do not have symptoms. Ask your health care provider if a screening pelvic exam is right for you.  If you have had past treatment for cervical cancer or a condition that could lead to cancer, you need Pap tests and screening for cancer for at least 20 years after your treatment. If Pap tests have been discontinued, your risk factors (such as having a new sexual partner) need to be reassessed to determine if screening should resume. Some women have medical problems that increase the chance of getting cervical cancer. In these cases, your health care provider may recommend more frequent screening and Pap tests.  Colorectal Cancer  This type of cancer can be detected and often prevented.  Routine colorectal cancer screening usually begins at 69  years of age and continues through 69 years of age.  Your health care provider may recommend screening at an earlier age if you have risk factors for colon cancer.  Your health care provider may also recommend using home test kits to check for hidden blood in the stool.  A small camera at the end of a tube can be used to examine your colon directly (sigmoidoscopy or colonoscopy). This is done to check for the earliest forms of colorectal cancer.  Routine screening usually begins at age 29.  Direct examination of the colon should be repeated every 5-10 years through 69 years of age. However, you may need to be screened more often if early forms of precancerous polyps or small growths are found.  Skin Cancer  Check your skin from head to toe regularly.  Tell your health care provider about any new moles or changes in moles, especially if there is a change in a mole's shape or color.  Also tell your health care provider if you have a mole that is  larger than the size of a pencil eraser.  Always use sunscreen. Apply sunscreen liberally and repeatedly throughout the day.  Protect yourself by wearing long sleeves, pants, a wide-brimmed hat, and sunglasses whenever you are outside.  Heart disease, diabetes, and high blood pressure  High blood pressure causes heart disease and increases the risk of stroke. High blood pressure is more likely to develop in: ? People who have blood pressure in the high end of the normal range (130-139/85-89 mm Hg). ? People who are overweight or obese. ? People who are African American.  If you are 40-37 years of age, have your blood pressure checked every 3-5 years. If you are 83 years of age or older, have your blood pressure checked every year. You should have your blood pressure measured twice-once when you are at a hospital or clinic, and once when you are not at a hospital or clinic. Record the average of the two measurements. To check your blood pressure  when you are not at a hospital or clinic, you can use: ? An automated blood pressure machine at a pharmacy. ? A home blood pressure monitor.  If you are between 62 years and 70 years old, ask your health care provider if you should take aspirin to prevent strokes.  Have regular diabetes screenings. This involves taking a blood sample to check your fasting blood sugar level. ? If you are at a normal weight and have a low risk for diabetes, have this test once every three years after 69 years of age. ? If you are overweight and have a high risk for diabetes, consider being tested at a younger age or more often. Preventing infection Hepatitis B  If you have a higher risk for hepatitis B, you should be screened for this virus. You are considered at high risk for hepatitis B if: ? You were born in a country where hepatitis B is common. Ask your health care provider which countries are considered high risk. ? Your parents were born in a high-risk country, and you have not been immunized against hepatitis B (hepatitis B vaccine). ? You have HIV or AIDS. ? You use needles to inject street drugs. ? You live with someone who has hepatitis B. ? You have had sex with someone who has hepatitis B. ? You get hemodialysis treatment. ? You take certain medicines for conditions, including cancer, organ transplantation, and autoimmune conditions.  Hepatitis C  Blood testing is recommended for: ? Everyone born from [birth year] through 1963. ? Anyone with known risk factors for hepatitis C.  Sexually transmitted infections (STIs)  You should be screened for sexually transmitted infections (STIs) including gonorrhea and chlamydia if: ? You are sexually active and are younger than 69 years of age. ? You are older than 69 years of age and your health care provider tells you that you are at risk for this type of infection. ? Your sexual activity has changed since you were last screened and you are at an increased  risk for chlamydia or gonorrhea. Ask your health care provider if you are at risk.  If you do not have HIV, but are at risk, it may be recommended that you take a prescription medicine daily to prevent HIV infection. This is called pre-exposure prophylaxis (PrEP). You are considered at risk if: ? You are sexually active and do not regularly use condoms or know the HIV status of your partner(s). ? You take drugs by injection. ? You are sexually active  with a partner who has HIV.  Talk with your health care provider about whether you are at high risk of being infected with HIV. If you choose to begin PrEP, you should first be tested for HIV. You should then be tested every 3 months for as long as you are taking PrEP. Pregnancy  If you are premenopausal and you may become pregnant, ask your health care provider about preconception counseling.  If you may become pregnant, take 400 to 800 micrograms (mcg) of folic acid every day.  If you want to prevent pregnancy, talk to your health care provider about birth control (contraception). Osteoporosis and menopause  Osteoporosis is a disease in which the bones lose minerals and strength with aging. This can result in serious bone fractures. Your risk for osteoporosis can be identified using a bone density scan.  If you are 30 years of age or older, or if you are at risk for osteoporosis and fractures, ask your health care provider if you should be screened.  Ask your health care provider whether you should take a calcium or vitamin D supplement to lower your risk for osteoporosis.  Menopause may have certain physical symptoms and risks.  Hormone replacement therapy may reduce some of these symptoms and risks. Talk to your health care provider about whether hormone replacement therapy is right for you. Follow these instructions at home:  Schedule regular health, dental, and eye exams.  Stay current with your immunizations.  Do not use any  tobacco products including cigarettes, chewing tobacco, or electronic cigarettes.  If you are pregnant, do not drink alcohol.  If you are breastfeeding, limit how much and how often you drink alcohol.  Limit alcohol intake to no more than 1 drink per day for nonpregnant women. One drink equals 12 ounces of beer, 5 ounces of wine, or 1 ounces of hard liquor.  Do not use street drugs.  Do not share needles.  Ask your health care provider for help if you need support or information about quitting drugs.  Tell your health care provider if you often feel depressed.  Tell your health care provider if you have ever been abused or do not feel safe at home. This information is not intended to replace advice given to you by your health care provider. Make sure you discuss any questions you have with your health care provider. Document Released: 04/24/2011 Document Revised: 03/16/2016 Document Reviewed: 07/13/2015 Elsevier Interactive Patient Education  [employer].

## 2017-04-20 NOTE — Progress Notes (Addendum)
Subjective:   Kristine Olson is a 71 y.o. female who presents for Medicare Annual (Subsequent) preventive examination.  Review of Systems:  No ROS.  Medicare Wellness Visit. Additional risk factors are reflected in the social history.  Cardiac Risk Factors include: advanced age (>106men, >42 women);family history of premature cardiovascular disease;dyslipidemia;hypertension;obesity (BMI >30kg/m2);sedentary lifestyle   Sleep patterns: Sleeps 4-7 hours. Has CPAP, rarely uses d/t "aggrevating", has tried several types of masks. Feels tired.  Home Safety/Smoke Alarms: Feels safe in home. Smoke alarms in place.  Living environment; residence and Firearm Safety: Lives with husband in 1 story home.  Seat Belt Safety/Bike Helmet: Wears seat belt.   Counseling:   Eye Exam-Last exam 2017, every 2 years. [facility].  Dental-Last exam < 6 months. Every 6 months Dr. Arita Miss.   Female:   Pap-N/A     Mammo-07/10/2016, negative.        Dexa scan-08/30/2016, Osteopenia      CCS-Colonoscopy 09/21/2014, polyp. Recall 5 years.      Objective:     Vitals: BP 123/73   Pulse 73   Temp 98.3 F (36.8 C) (Oral)   Resp 16   Ht 5\' 4"  (1.626 m)   Wt 212 lb 6 oz (96.3 kg)   SpO2 98%   BMI 36.45 kg/m   Body mass index is 36.45 kg/m.   Tobacco History  Smoking Status  . Never Smoker  Smokeless Tobacco  . Never Used     Counseling given: Yes   Past Medical History:  Diagnosis Date  . Arthritis    ostearthritis - right  medial knee  . Depression   . GERD (gastroesophageal reflux disease)    "heartburn daily"-TUMS helpful  . Hx of sinus tachycardia    rarely any problems since metoprolol use  . Hyperlipidemia   . Hypertension   . Hypothyroidism   . Sleep apnea    uses c pap   Past Surgical History:  Procedure Laterality Date  . BIOPSY BREAST     2 biopsies & 2 aspirations  . CESAREAN SECTION    . COLONOSCOPY W/ POLYPECTOMY  multiple since 2007   adenomas,  diverticulosis;last 2015  . ESOPHAGOGASTRODUODENOSCOPY (EGD) WITH PROPOFOL N/A 05/29/2016   Procedure: ESOPHAGOGASTRODUODENOSCOPY (EGD) WITH PROPOFOL;  Surgeon: Gatha Mayer, MD;  Location: [facility];  Service: Endoscopy;  Laterality: N/A;  . PARTIAL KNEE ARTHROPLASTY Right 05/22/2016   Procedure: RIGHT KNEE MEDIAL UNICOMPARTMENTAL ARTHROPLASTY;  Surgeon: Gaynelle Arabian, MD;  Location: [facility];  Service: Orthopedics;  Laterality: Right;  . TONSILLECTOMY AND ADENOIDECTOMY    . WISDOM TOOTH EXTRACTION     Family History  Problem Relation Age of Onset  . Osteoporosis Mother   . Thyroid disease Mother   . Stroke Maternal Aunt         X 3   . Diabetes Maternal Aunt   . Heart attack Maternal Grandfather        > 70  . Melanoma Brother   . Multiple sclerosis Daughter 26  . Deep vein thrombosis Paternal Aunt   . Other Paternal Aunt        Phlebitis without deep venous thrombosis  . Colon cancer Neg Hx    History  Sexual Activity  . Sexual activity: Yes    Outpatient Encounter Prescriptions as of 04/20/2017  Medication Sig  . acetaminophen (TYLENOL) 500 MG tablet Take 500-1,000 mg by mouth every 6 (six) hours as needed for mild pain, moderate pain, fever or  headache.  . cetirizine (ZYRTEC) 10 MG tablet Take 10 mg by mouth daily as needed for allergies.  . Cholecalciferol (VITAMIN D) 2000 units tablet Take 1,000 Units by mouth daily.   . citalopram (CELEXA) 20 MG tablet Take 20 mg by mouth at bedtime.   Marland Kitchen levothyroxine (SYNTHROID, LEVOTHROID) 112 MCG tablet TAKE 1 TABLET DAILY EXCEPT FOR 1 AND 1/2 TABLETS ON WEDNESDAY (Patient taking differently: Take 112-168 mcg by mouth daily with breakfast. TAKE 1 TABLET DAILY EXCEPT FOR 1 AND 1/2 TABLETS ON WEDNESDAY)  . metoprolol tartrate (LOPRESSOR) 25 MG tablet TAKE 1/2 TABLET BY MOUTH TWICE A DAY  . pantoprazole (PROTONIX) 40 MG tablet Take 1 tablet (40 mg total) by mouth daily. Need appointment  . spironolactone (ALDACTONE) 25 MG tablet TAKE 1  TABLET (25 MG TOTAL) BY MOUTH DAILY.  . [DISCONTINUED] triamcinolone cream (KENALOG) 0.1 % Apply 1 application topically 2 (two) times daily.  . Ferrous Sulfate (IRON) 325 (65 Fe) MG TABS Take 650 mg by mouth daily.  . [DISCONTINUED] doxycycline (VIBRAMYCIN) 100 MG capsule Take 1 capsule (100 mg total) by mouth 2 (two) times daily.  . [DISCONTINUED] hydrOXYzine (ATARAX/VISTARIL) 10 MG tablet Take 1 tablet (10 mg total) by mouth 3 (three) times daily as needed.   No facility-administered encounter medications on file as of 04/20/2017.     Activities of Daily Living In your present state of health, do you have any difficulty performing the following activities: 04/20/2017 04/20/2017  Hearing? N N  Vision? N N  Difficulty concentrating or making decisions? N N  Walking or climbing stairs? Y N  Dressing or bathing? N N  Doing errands, shopping? N N  Preparing Food and eating ? N -  Using the Toilet? N -  In the past six months, have you accidently leaked urine? Y -  Do you have problems with loss of bowel control? N -  Managing your Medications? N -  Managing your Finances? N -  Housekeeping or managing your Housekeeping? N -  Some recent data might be hidden    Patient Care Team: Midge Minium, MD as PCP - General (Family Medicine) Charlynn Grimes, MD as Referring Physician (Obstetrics and Gynecology) Gatha Mayer, MD as Consulting Physician (Gastroenterology) Marvia Pickles (Psychiatry) Gaynelle Arabian, MD as Consulting Physician (Orthopedic Surgery) Deneise Lever, MD as Consulting Physician (Pulmonary Disease)    Assessment:    Physical assessment deferred to PCP.  Exercise Activities and Dietary recommendations Current Exercise Habits: The patient does not participate in regular exercise at present, Exercise limited by: orthopedic condition(s)  Diet (meal preparation, eat out, water intake, caffeinated beverages, dairy products, fruits and vegetables): Drinks sweet  tea, some water.   Breakfast: egg, cereal, grits, oatmeal, coffee Lunch: sandwich, salad Dinner: protein and vegetables.     Discussed heart healthy diet, currently participating in Weight Watchers.  Encouraged to begin activity again.   Goals    . Weight (lb) < 200 lb (90.7 kg)          Lose weight by increasing activity and continuing weight watchers.       Fall Risk Fall Risk  04/20/2017 04/20/2017 03/20/2017 04/17/2016 12/03/2015  Falls in the past year? No No No No No   Depression Screen PHQ 2/9 Scores 04/20/2017 03/20/2017 04/17/2016 12/03/2015  PHQ - 2 Score 1 0 0 0  PHQ- 9 Score 1 0 - -  Exception Documentation - - - Medical reason     Cognitive Function  Ad8 score reviewed for issues:  Issues making decisions: no  Less interest in hobbies / activities: no  Repeats questions, stories (family complaining): no  Trouble using ordinary gadgets (microwave, computer, phone): no  Forgets the month or year: no  Mismanaging finances: no  Remembering appts: no  Daily problems with thinking and/or memory: no Ad8 score is=0     Immunization History  Administered Date(s) Administered  . Hepatitis A, Adult 10/04/2012  . Hepatitis B, adult 10/04/2012  . IPV 10/04/2012  . Influenza,inj,Quad PF,36+ Mos 10/09/2016  . Influenza-Unspecified 06/23/2012  . Meningococcal Conjugate 10/04/2012  . Pneumococcal Conjugate-13 04/17/2016  . Td 10/24/2003  . Tdap 10/04/2012   Screening Tests Health Maintenance  Topic Date Due  . PNA vac Low Risk Adult (2 of 2 - PPSV23) 04/17/2017  . Hepatitis C Screening  07/23/2017 (Originally 01-14-1946)  . INFLUENZA VACCINE  05/23/2017  . MAMMOGRAM  07/10/2018  . DEXA SCAN  08/30/2018  . COLONOSCOPY  09/22/2019  . TETANUS/TDAP  10/04/2022      Plan:    Bring a copy of your advance directives to your next office visit.  Continue doing brain stimulating activities (puzzles, reading, adult coloring books, staying active) to keep  memory sharp.  I have personally reviewed and noted the following in the patient's chart:   . Medical and social history . Use of alcohol, tobacco or illicit drugs  . Current medications and supplements . Functional ability and status . Nutritional status . Physical activity . Advanced directives . List of other physicians . Hospitalizations, surgeries, and ER visits in previous 12 months . Vitals . Screenings to include cognitive, depression, and falls . Referrals and appointments  In addition, I have reviewed and discussed with patient certain preventive protocols, quality metrics, and best practice recommendations. A written personalized care plan for preventive services as well as general preventive health recommendations were provided to patient.     Gerilyn Nestle, RN  04/20/2017   Documentation reviewed and agree w/ above.  Annye Asa, MD

## 2017-04-20 NOTE — Progress Notes (Signed)
Pre visit review using our clinic review tool, if applicable. No additional management support is needed unless otherwise documented below in the visit note. 

## 2017-04-23 ENCOUNTER — Telehealth: Payer: Self-pay | Admitting: General Practice

## 2017-04-23 ENCOUNTER — Other Ambulatory Visit: Payer: Self-pay | Admitting: General Practice

## 2017-04-23 MED ORDER — VITAMIN D (ERGOCALCIFEROL) 1.25 MG (50000 UNIT) PO CAPS: 50000.0000 [IU] | ORAL_CAPSULE | ORAL | 0 refills | Status: DC

## 2017-04-23 NOTE — Progress Notes (Signed)
Called pt and lmovm to return call.

## 2017-04-23 NOTE — Telephone Encounter (Signed)
FYI, pt called in to advise that her Prevnar injection site hurt badly for 3 days, there was redness and tenderness to the area. Pt states that symptoms are going away. She just wanted it documented in her chart that she had some residual symptoms.

## 2017-04-23 NOTE — Telephone Encounter (Signed)
Noted. No worries as long as symptoms continue to resolve.

## 2017-05-31 ENCOUNTER — Other Ambulatory Visit: Payer: Self-pay | Admitting: Family Medicine

## 2017-06-05 ENCOUNTER — Other Ambulatory Visit: Payer: Self-pay | Admitting: Internal Medicine

## 2017-06-12 ENCOUNTER — Other Ambulatory Visit: Payer: Self-pay | Admitting: Family Medicine

## 2017-06-28 ENCOUNTER — Telehealth: Payer: Self-pay | Admitting: Family Medicine

## 2017-06-28 NOTE — Telephone Encounter (Signed)
FYI

## 2017-06-28 NOTE — Telephone Encounter (Signed)
Received forms by fax, placed in bin up front with chart sheet.

## 2017-06-28 NOTE — Telephone Encounter (Signed)
Form completed and placed in basket  

## 2017-06-29 NOTE — Telephone Encounter (Signed)
Pt aware forms are ready for pick up

## 2017-07-09 ENCOUNTER — Ambulatory Visit (INDEPENDENT_AMBULATORY_CARE_PROVIDER_SITE_OTHER): Payer: Medicare Other | Admitting: Internal Medicine

## 2017-07-09 ENCOUNTER — Encounter: Payer: Self-pay | Admitting: Internal Medicine

## 2017-07-09 ENCOUNTER — Encounter (INDEPENDENT_AMBULATORY_CARE_PROVIDER_SITE_OTHER): Payer: Self-pay

## 2017-07-09 VITALS — BP 114/74 | HR 80 | Ht 62.6 in | Wt 214.2 lb

## 2017-07-09 DIAGNOSIS — K219 Gastro-esophageal reflux disease without esophagitis: Secondary | ICD-10-CM | POA: Diagnosis not present

## 2017-07-09 DIAGNOSIS — K589 Irritable bowel syndrome without diarrhea: Secondary | ICD-10-CM

## 2017-07-09 DIAGNOSIS — Z8711 Personal history of peptic ulcer disease: Secondary | ICD-10-CM | POA: Diagnosis not present

## 2017-07-09 DIAGNOSIS — K649 Unspecified hemorrhoids: Secondary | ICD-10-CM

## 2017-07-09 MED ORDER — PANTOPRAZOLE SODIUM 20 MG PO TBEC: 20.0000 mg | DELAYED_RELEASE_TABLET | Freq: Every day | ORAL | 2 refills | Status: DC

## 2017-07-09 NOTE — Progress Notes (Signed)
Kristine Olson 71 y.o. 12-15-1945 [id number]  Assessment & Plan:   Encounter Diagnoses  Name Primary?  . Gastroesophageal reflux disease without esophagitis Yes  . Bleeding hemorrhoids   . Irritable bowel syndrome   . History of bleeding peptic ulcer     It is reasonable for her to try to reduce her PPI and perhaps come off. We talked about tapering using alternating 40 and 20 mg doses of pantoprazole and then tapering off the pantoprazole. She may need antacids when necessary or H2 blockers. She will continue to work on lifestyle changes she is enrolled in [employer] and is encouraged to continue this, she should reduce her suite iced tea consumption and follow other reflux measures and we have provided a handout.   She did have a small ulcer with bleeding on Xarelto at the time of knee replacement in 2017, and she is preparing to have another knee replacement in November. Dr. Elmyra Ricks has suggested maybe she would just use aspirin. I think taking a daily PPI for ulcer prophylaxis and using Xarelto again would be reasonable if that is better for DVT prophylaxis. She can do that while she is on anticoagulation. Even if she takes 325 mg aspirin I probably have her take the PPI because she couldn't get an ulcer bleed from the aspirin.  She has symptomatic hemorrhoids and we can address banding of those at a later date after her knee surgery she will call back. We can also further discuss her variable bowel habits and IBS. Some of her symptoms suggest possible pelvic floor dysfunction may can consider further evaluation and possible treatment for that.  I appreciate the opportunity to care for this patient. CC: Midge Minium, MD Dr. Larey Dresser  Subjective:   Chief Complaint:Refill for reflux and several other questions  HPI The patient is here for follow-up, I know her from previous colonoscopy and last year she had a bleeding pyloric channel ulcer it was small, but  was on Xarelto at the time of knee replacement and had a fair amount of bleeding. She recovered from that. H. pylori serology negative. She try to come off the PPI but got rapid onset of heartburn symptoms and had been taking Tums quite frequently prior to starting a PPI for the ulcer. She has found that she can eat whatever she wants without heartburn on pantoprazole 40 mg daily. I have not seen her since last year. She is working on weight loss through [employer]. She does not smoke. She drinks decaf coffee but drinks "a lot of sweetened iced tea". She plans to have knee replacement again in November. She says Dr. Elmyra Ricks is considering using aspirin instead of Xarelto given the bleeding she had. She has intermittent prolapsing and bleeding hemorrhoids and has to wear a pad to catch blood sometimes. She has IBS and variable bowel habit tends towards frequent stools 2 or 3 in the mornings. Some straining at times. Colonoscopy 2015 with internal hemorrhoids diverticulosis and a small adenoma.   She has been reading and has some concern about osteopenia and association of osteoporosis with PPI use. She questions could she come off the PPI. However she remembers trying to stop it last year and within 2 days had terrible heartburn and went back on it. Allergies  Allergen Reactions  . Cefaclor Rash    Because of a history of documented adverse serious drug reaction;Medi Alert bracelet  is recommended  . Penicillins     REACTION: rash  Because of a history of documented adverse serious drug reaction;Medi Alert bracelet  is recommended Has patient had a PCN reaction causing immediate rash, facial/tongue/throat swelling, SOB or lightheadedness with hypotension: no Has patient had a PCN reaction causing severe rash involving mucus membranes or skin necrosis: {no Has patient had a PCN reaction that required hospitalization no Has patient had a PCN reaction occurring within the last 10 years: no If all of  the above ans  . Sulfa Antibiotics Rash    01/22/13 after 9 days of Sulfa Because of a history of documented adverse serious drug reaction;Medi Alert bracelet  is recommended  . Advair Diskus [Fluticasone-Salmeterol]     "Felt weird" and flushing.   Current Meds  Medication Sig  . acetaminophen (TYLENOL) 500 MG tablet Take 500-1,000 mg by mouth every 6 (six) hours as needed for mild pain, moderate pain, fever or headache.  . cetirizine (ZYRTEC) 10 MG tablet Take 10 mg by mouth daily as needed for allergies.  . citalopram (CELEXA) 20 MG tablet Take 20 mg by mouth at bedtime.   Marland Kitchen levothyroxine (SYNTHROID, LEVOTHROID) 112 MCG tablet TAKE 1 TABLET DAILY EXCEPT FOR 1 AND 1/2 TABLETS ON WEDNESDAY  . metoprolol tartrate (LOPRESSOR) 25 MG tablet TAKE 1/2 TABLET BY MOUTH TWICE A DAY  . spironolactone (ALDACTONE) 25 MG tablet TAKE 1 TABLET (25 MG TOTAL) BY MOUTH DAILY.  Marland Kitchen Vitamin D, Ergocalciferol, (DRISDOL) 50000 units CAPS capsule Take 1 capsule (50,000 Units total) by mouth every 7 (seven) days.  .  pantoprazole (PROTONIX) 40 MG tablet TAKE 1 TABLET (40 MG TOTAL) BY MOUTH DAILY. NEED APPOINTMENT   Past Medical History:  Diagnosis Date  . Arthritis    ostearthritis - right  medial knee  . Depression   . GERD (gastroesophageal reflux disease)    "heartburn daily"-TUMS helpful  . Hx of sinus tachycardia    rarely any problems since metoprolol use  . Hyperlipidemia   . Hypertension   . Hypothyroidism   . Sleep apnea    uses c pap   Past Surgical History:  Procedure Laterality Date  . BIOPSY BREAST     2 biopsies & 2 aspirations  . CESAREAN SECTION    . COLONOSCOPY W/ POLYPECTOMY  multiple since 2007   adenomas, diverticulosis;last 2015  . ESOPHAGOGASTRODUODENOSCOPY (EGD) WITH PROPOFOL N/A 05/29/2016   Procedure: ESOPHAGOGASTRODUODENOSCOPY (EGD) WITH PROPOFOL;  Surgeon: Gatha Mayer, MD;  Location: [facility];  Service: Endoscopy;  Laterality: N/A;  . PARTIAL KNEE ARTHROPLASTY Right  05/22/2016   Procedure: RIGHT KNEE MEDIAL UNICOMPARTMENTAL ARTHROPLASTY;  Surgeon: Gaynelle Arabian, MD;  Location: [facility];  Service: Orthopedics;  Laterality: Right;  . TONSILLECTOMY AND ADENOIDECTOMY    . WISDOM TOOTH EXTRACTION     Social History   Social History  . Marital status: Married   Occupational History  Retired Social History Main Topics  . Smoking status: Never Smoker  . Smokeless tobacco: Never Used  . Alcohol use 0.0 oz/week     Comment:  very rarely  . Drug use: No  . Sexual activity: Yes    family history includes Deep vein thrombosis in her paternal aunt; Diabetes in her maternal aunt; Heart attack in her maternal grandfather; Melanoma in her brother; Multiple sclerosis (age of onset: 50) in her daughter; Osteoporosis in her mother; Other in her paternal aunt; Stroke in her maternal aunt; Thyroid disease in her mother.   Review of Systems Left knee osteoarthritis and pain for knee replacement in November Dr.  [place]  Objective:   Physical Exam BP 114/74 (BP Location: Left Arm, Patient Position: Sitting, Cuff Size: Large)   Pulse 80   Ht 5' 2.6" (1.59 m) Comment: height measured without shoes  Wt 214 lb 4 oz (97.2 kg)   BMI 38.44 kg/m  Pleasant obese white woman known acute distress Eyes are anicteric Appropriate mood and affect   25 minutes time spent with patient > half in counseling coordination of care

## 2017-07-09 NOTE — Patient Instructions (Addendum)
We sent Protonix 20 mg to your pharmacy. Start alternating with 40 mg tablets over 1-2 weeks and then slowly taper down to just Protonix 20 mg then stop.   You can take over the counter Pepcid or Zantac as needed for breakthrough symptoms.   You have been given a GERD handout and please reduce your sweet tea consumption as discussed at today's visit.   Remember to take Protonix every day following your knee surgery to prevent a pyloric ulcer bleed while taking Xarelto.  Call back in November to schedule your hemorrhoid banding appointment.

## 2017-07-14 ENCOUNTER — Other Ambulatory Visit: Payer: Self-pay | Admitting: Family Medicine

## 2017-07-24 ENCOUNTER — Ambulatory Visit (INDEPENDENT_AMBULATORY_CARE_PROVIDER_SITE_OTHER): Payer: Medicare Other | Admitting: General Practice

## 2017-07-24 VITALS — BP 124/80 | HR 73 | Temp 98.3°F | Resp 16 | Ht 64.0 in | Wt 220.0 lb

## 2017-07-24 DIAGNOSIS — Z01818 Encounter for other preprocedural examination: Secondary | ICD-10-CM

## 2017-07-24 DIAGNOSIS — Z23 Encounter for immunization: Secondary | ICD-10-CM | POA: Diagnosis not present

## 2017-07-24 NOTE — Progress Notes (Signed)
Pt here for surgical clearance- TKR on left side, vitals normal. Pt also given flu shot.

## 2017-08-09 NOTE — Progress Notes (Signed)
Please place orders in EPIC as patient is being scheduled for a pre-op appointment! Thank you! 

## 2017-08-13 NOTE — Progress Notes (Signed)
Please place orders in EPIC as patient has a pre-op appointment on 08/21/2017! Thank you!

## 2017-08-14 ENCOUNTER — Ambulatory Visit: Payer: Self-pay | Admitting: Orthopedic Surgery

## 2017-08-17 NOTE — Pre-Procedure Instructions (Signed)
Surgical clearance from PCP on chart 07/24/17 EKG in epic 07/24/17

## 2017-08-17 NOTE — Patient Instructions (Addendum)
Kristine Olson  08/17/2017   Your procedure is scheduled on: Monday, Nov. 5,2018   Surgery time: 12:40pm-1:30PM   Report to [facility] Main  Entrance    Report to admitting at  10:00 AM   Call this number if you have problems the morning of surgery [phone]   Remember: ONLY 1 PERSON MAY GO WITH YOU TO SHORT STAY TO GET  READY MORNING OF [facility].   Do not eat food or drink liquids :After Midnight.   Bring CPAP mask and tubing day of surgery   Take these medicines the morning of surgery with A SIP OF WATER: Levothyroxine (Synthroid),  Pantoprazole (Protonix)                               You may not have any metal on your body including hair pins   Do not wear jewelry, piercings,  make-up, lotions, powders or perfumes, deodorant             Do not wear nail polish.    Do not shave  48 hours prior to surgery.                Do not bring valuables to the hospital. [facility].   Contacts, dentures or bridgework may not be worn into surgery.   Leave suitcase in the car. After surgery it may be brought to your room.              Please read over the following fact sheets you were given: _____________________________________________________________________             [facility] - Preparing for Surgery Before surgery, you can play an important role.  Because skin is not sterile, your skin needs to be as free of germs as possible.  You can reduce the number of germs on your skin by washing with CHG (chlorahexidine gluconate) soap before surgery.  CHG is an antiseptic cleaner which kills germs and bonds with the skin to continue killing germs even after washing. Please DO NOT use if you have an allergy to CHG or antibacterial soaps.  If your skin becomes reddened/irritated stop using the CHG and inform your nurse when you arrive at Short Stay. Do not shave (including legs and underarms) for at  least 48 hours prior to the first CHG shower.  You may shave your face/neck.  Please follow these instructions carefully:  1.  Shower with CHG Soap the night before surgery and the  morning of Surgery.  2.  If you choose to wash your hair, wash your hair first as usual with your  normal  shampoo.  3.  After you shampoo, rinse your hair and body thoroughly to remove the  shampoo.                             4.  Use CHG as you would any other liquid soap.  You can apply chg directly  to the skin and wash                       Gently with a scrungie or clean washcloth.  5.  Apply the  CHG Soap to your body ONLY FROM THE NECK DOWN.   Do not use on face/ open                           Wound or open sores. Avoid contact with eyes, ears mouth and genitals (private parts).                       Wash face,  Genitals (private parts) with your normal soap.             6.  Wash thoroughly, paying special attention to the area where your surgery  will be performed.  7.  Thoroughly rinse your body with warm water from the neck down.  8.  DO NOT shower/wash with your normal soap after using and rinsing off  the CHG Soap.                9.  Pat yourself dry with a clean towel.            10.  Wear clean pajamas.            11.  Place clean sheets on your bed the night of your first shower and do not  sleep with pets. Day of Surgery : Do not apply any lotions/deodorants the morning of surgery.  Please wear clean clothes to the hospital/surgery [facility].  FAILURE TO FOLLOW THESE INSTRUCTIONS MAY RESULT IN THE CANCELLATION OF YOUR SURGERY  PATIENT SIGNATURE_________________________________  NURSE SIGNATURE__________________________________  ________________________________________________________________________   [facility]  An incentive spirometer is a tool that can help keep your lungs clear and active. This tool measures how well you are filling your lungs with each breath. Taking long deep  breaths may help reverse or decrease the chance of developing breathing (pulmonary) problems (especially infection) following:  A long period of time when you are unable to move or be active. BEFORE THE PROCEDURE   If the spirometer includes an indicator to show your best effort, your nurse or respiratory therapist will set it to a desired goal.  If possible, sit up straight or lean slightly forward. Try not to slouch.  Hold the incentive spirometer in an upright position. INSTRUCTIONS FOR USE  1. Sit on the edge of your bed if possible, or sit up as far as you can in bed or on a chair. 2. Hold the incentive spirometer in an upright position. 3. Breathe out normally. 4. Place the mouthpiece in your mouth and seal your lips tightly around it. 5. Breathe in slowly and as deeply as possible, raising the piston or the ball toward the top of the column. 6. Hold your breath for 3-5 seconds or for as long as possible. Allow the piston or ball to fall to the bottom of the column. 7. Remove the mouthpiece from your mouth and breathe out normally. 8. Rest for a few seconds and repeat Steps 1 through 7 at least 10 times every 1-2 hours when you are awake. Take your time and take a few normal breaths between deep breaths. 9. The spirometer may include an indicator to show your best effort. Use the indicator as a goal to work toward during each repetition. 10. After each set of 10 deep breaths, practice coughing to be sure your lungs are clear. If you have an incision (the cut made at the time of surgery), support your incision when coughing by placing a pillow or rolled  up towels firmly against it. Once you are able to get out of bed, walk around indoors and cough well. You may stop using the incentive spirometer when instructed by your caregiver.  RISKS AND COMPLICATIONS  Take your time so you do not get dizzy or light-headed.  If you are in pain, you may need to take or ask for pain medication before  doing incentive spirometry. It is harder to take a deep breath if you are having pain. AFTER USE  Rest and breathe slowly and easily.  It can be helpful to keep track of a log of your progress. Your caregiver can provide you with a simple table to help with this. If you are using the spirometer at home, follow these instructions: [place] IF:   You are having difficultly using the spirometer.  You have trouble using the spirometer as often as instructed.  Your pain medication is not giving enough relief while using the spirometer.  You develop fever of 100.5 F (38.1 C) or higher. SEEK IMMEDIATE MEDICAL CARE IF:   You cough up bloody sputum that had not been present before.  You develop fever of 102 F (38.9 C) or greater.  You develop worsening pain at or near the incision site. MAKE SURE YOU:   Understand these instructions.  Will watch your condition.  Will get help right away if you are not doing well or get worse. Document Released: 02/19/2007 Document Revised: 01/01/2012 Document Reviewed: 04/22/2007 Exit[employer] Patient Information 2014 [employer], [place].   ________________________________________________________________________  WHAT IS A BLOOD TRANSFUSION? Blood Transfusion Information  A transfusion is the replacement of blood or some of its parts. Blood is made up of multiple cells which provide different functions.  Red blood cells carry oxygen and are used for blood loss replacement.  White blood cells fight against infection.  Platelets control bleeding.  Plasma helps clot blood.  Other blood products are available for specialized needs, such as hemophilia or other clotting disorders. BEFORE THE TRANSFUSION  Who gives blood for transfusions?   Healthy volunteers who are fully evaluated to make sure their blood is safe. This is blood bank blood. Transfusion therapy is the safest it has ever been in the practice of medicine. Before blood is taken  from a donor, a complete history is taken to make sure that person has no history of diseases nor engages in risky social behavior (examples are intravenous drug use or sexual activity with multiple partners). The donor's travel history is screened to minimize risk of transmitting infections, such as malaria. The donated blood is tested for signs of infectious diseases, such as HIV and hepatitis. The blood is then tested to be sure it is compatible with you in order to minimize the chance of a transfusion reaction. If you or a relative donates blood, this is often done in anticipation of surgery and is not appropriate for emergency situations. It takes many days to process the donated blood. RISKS AND COMPLICATIONS Although transfusion therapy is very safe and saves many lives, the main dangers of transfusion include:   Getting an infectious disease.  Developing a transfusion reaction. This is an allergic reaction to something in the blood you were given. Every precaution is taken to prevent this. The decision to have a blood transfusion has been considered carefully by your caregiver before blood is given. Blood is not given unless the benefits outweigh the risks. AFTER THE TRANSFUSION  Right after receiving a blood transfusion, you will usually feel  much better and more energetic. This is especially true if your red blood cells have gotten low (anemic). The transfusion raises the level of the red blood cells which carry oxygen, and this usually causes an energy increase.  The nurse administering the transfusion will monitor you carefully for complications. HOME CARE INSTRUCTIONS  No special instructions are needed after a transfusion. You may find your energy is better. Speak with your caregiver about any limitations on activity for underlying diseases you may have. SEEK MEDICAL CARE IF:   Your condition is not improving after your transfusion.  You develop redness or irritation at the  intravenous (IV) site. SEEK IMMEDIATE MEDICAL CARE IF:  Any of the following symptoms occur over the next 12 hours:  Shaking chills.  You have a temperature by mouth above 102 F (38.9 C), not controlled by medicine.  Chest, back, or muscle pain.  People around you feel you are not acting correctly or are confused.  Shortness of breath or difficulty breathing.  Dizziness and fainting.  You get a rash or develop hives.  You have a decrease in urine output.  Your urine turns a dark color or changes to pink, red, or brown. Any of the following symptoms occur over the next 10 days:  You have a temperature by mouth above 102 F (38.9 C), not controlled by medicine.  Shortness of breath.  Weakness after normal activity.  The white part of the eye turns yellow (jaundice).  You have a decrease in the amount of urine or are urinating less often.  Your urine turns a dark color or changes to pink, red, or brown. Document Released: 10/06/2000 Document Revised: 01/01/2012 Document Reviewed: 05/25/2008 [facility] Patient Information 2014 [place], [place].  _______________________________________________________________________

## 2017-08-19 ENCOUNTER — Ambulatory Visit: Payer: Self-pay | Admitting: Orthopedic Surgery

## 2017-08-21 ENCOUNTER — Encounter (HOSPITAL_COMMUNITY): Payer: Self-pay

## 2017-08-21 ENCOUNTER — Encounter (HOSPITAL_COMMUNITY)
Admission: RE | Admit: 2017-08-21 | Discharge: 2017-08-21 | Disposition: A | Discharge status: Home / Self Care | Payer: Medicare Other | Source: Ambulatory Visit | Attending: Orthopedic Surgery | Admitting: Orthopedic Surgery

## 2017-08-21 DIAGNOSIS — M1712 Unilateral primary osteoarthritis, left knee: Secondary | ICD-10-CM | POA: Diagnosis not present

## 2017-08-21 DIAGNOSIS — Z01812 Encounter for preprocedural laboratory examination: Secondary | ICD-10-CM | POA: Diagnosis present

## 2017-08-21 HISTORY — DX: Anemia, unspecified: D64.9

## 2017-08-21 HISTORY — DX: Other complications of anesthesia, initial encounter: T88.59XA

## 2017-08-21 HISTORY — DX: Personal history of other medical treatment: Z92.89

## 2017-08-21 HISTORY — DX: Adverse effect of unspecified anesthetic, initial encounter: T41.45XA

## 2017-08-21 HISTORY — DX: Asymptomatic varicose veins of unspecified lower extremity: I83.90

## 2017-08-21 LAB — COMPREHENSIVE METABOLIC PANEL
ALT: 17 U/L (ref 14–54)
AST: 24 U/L (ref 15–41)
Albumin: 3.9 g/dL (ref 3.5–5.0)
Alkaline Phosphatase: 68 U/L (ref 38–126)
Anion gap: 9 (ref 5–15)
BUN: 17 mg/dL (ref 6–20)
CHLORIDE: 104 mmol/L (ref 101–111)
CO2: 26 mmol/L (ref 22–32)
CREATININE: 1.16 mg/dL — AB (ref 0.44–1.00)
Calcium: 9.4 mg/dL (ref 8.9–10.3)
GFR, EST AFRICAN AMERICAN: 54 mL/min — AB (ref >60)
GFR, EST NON AFRICAN AMERICAN: 47 mL/min — AB (ref >60)
Glucose, Bld: 91 mg/dL (ref 65–99)
POTASSIUM: 4.5 mmol/L (ref 3.5–5.1)
Sodium: 139 mmol/L (ref 135–145)
Total Bilirubin: 0.7 mg/dL (ref 0.3–1.2)
Total Protein: 7.5 g/dL (ref 6.5–8.1)

## 2017-08-21 LAB — PROTIME-INR
INR: 0.99
PROTHROMBIN TIME: 13 s (ref 11.4–15.2)

## 2017-08-21 LAB — APTT: aPTT: 32 seconds (ref 24–36)

## 2017-08-21 LAB — SURGICAL PCR SCREEN
MRSA, PCR: NEGATIVE
Staphylococcus aureus: NEGATIVE

## 2017-08-21 LAB — CBC
HCT: 38.7 % (ref 36.0–46.0)
Hemoglobin: 12.9 g/dL (ref 12.0–15.0)
MCH: 29.5 pg (ref 26.0–34.0)
MCHC: 33.3 g/dL (ref 30.0–36.0)
MCV: 88.6 fL (ref 78.0–100.0)
PLATELETS: 178 10*3/uL (ref 150–400)
RBC: 4.37 MIL/uL (ref 3.87–5.11)
RDW: 13.3 % (ref 11.5–15.5)
WBC: 5.3 10*3/uL (ref 4.0–10.5)

## 2017-08-21 NOTE — Pre-Procedure Instructions (Signed)
CMP faxed to Dr. Maureen Ralphs via epic.

## 2017-08-24 ENCOUNTER — Encounter: Payer: Self-pay | Admitting: Physician Assistant

## 2017-08-24 ENCOUNTER — Ambulatory Visit (INDEPENDENT_AMBULATORY_CARE_PROVIDER_SITE_OTHER): Payer: Medicare Other | Admitting: Physician Assistant

## 2017-08-24 VITALS — BP 120/82 | HR 72 | Temp 97.7°F | Resp 14 | Ht 63.0 in | Wt 209.0 lb

## 2017-08-24 DIAGNOSIS — B9789 Other viral agents as the cause of diseases classified elsewhere: Secondary | ICD-10-CM

## 2017-08-24 DIAGNOSIS — J069 Acute upper respiratory infection, unspecified: Secondary | ICD-10-CM

## 2017-08-24 MED ORDER — AZELASTINE HCL 0.1 % NA SOLN: 2.0000 | Freq: Two times a day (BID) | NASAL | 1 refills | Status: DC

## 2017-08-24 NOTE — Progress Notes (Signed)
Pre visit review using our clinic review tool, if applicable. No additional management support is needed unless otherwise documented below in the visit note. 

## 2017-08-24 NOTE — Progress Notes (Signed)
Patient presents to clinic today c/o 2.5 days of sore throat, dry cough and nasal congestion.  Denies ear pain, sinus pressure/pain. Has noted watery/itchy eyes initially that has resolving. Denies known sick contact or recent travel. Denies chest congestion, wheezing, chest pain or SOB. Has taken OTC medications as directed.  Patient with history of fall seasonal allergies.   Past Medical History:  Diagnosis Date  . Acute pyloric channel ulcer with hemorrhage on Xarelto   . Anemia    history of anemia after bleeding ulcer  . Arthritis    ostearthritis - right  medial knee  . Complication of anesthesia    was given a medication while waiting to go to surgery that caused burning sensation on scalp was given another medication and the burning went away  . Depression   . GERD (gastroesophageal reflux disease)    "heartburn daily"-TUMS helpful  . History of blood transfusion    2 units PRBC  . Hx of sinus tachycardia    rarely any problems since metoprolol use  . Hyperlipidemia    patient unaware, has never taking medication  . Hypertension   . Hypothyroidism   . Sleep apnea    uses c pap  . Varicose vein of leg     Current Outpatient Prescriptions on File Prior to Visit  Medication Sig Dispense Refill  . calcium carbonate (TUMS - DOSED IN MG ELEMENTAL CALCIUM) 500 MG chewable tablet Chew 1 tablet by mouth 2 (two) times daily as needed for indigestion or heartburn.    . cetirizine (ZYRTEC) 10 MG tablet Take 10 mg by mouth daily as needed for allergies.    . Cholecalciferol (VITAMIN D) 2000 units CAPS Take 2,000 Units by mouth daily.    . citalopram (CELEXA) 20 MG tablet Take 20 mg by mouth at bedtime.     Marland Kitchen levothyroxine (SYNTHROID, LEVOTHROID) 112 MCG tablet TAKE 1 TABLET DAILY EXCEPT FOR 1 AND 1/2 TABLETS ON WEDNESDAY 97 tablet 1  . metoprolol tartrate (LOPRESSOR) 25 MG tablet TAKE 1/2 TABLET BY MOUTH TWICE A DAY (Patient taking differently: TAKE 1 TABLET BY MOUTH DAILY) 90 tablet  1  . pantoprazole (PROTONIX) 20 MG tablet Take 1 tablet (20 mg total) by mouth daily before breakfast. (Patient taking differently: Take 20-40 mg by mouth every other day. ALTERNATING DAYS TAKES 20 MG 1 DAY AND 40 MG THE NEXT DAY) 90 tablet 2  . spironolactone (ALDACTONE) 25 MG tablet TAKE 1 TABLET (25 MG TOTAL) BY MOUTH DAILY. (Patient taking differently: Take 25 mg by mouth every evening. ) 90 tablet 1   No current facility-administered medications on file prior to visit.     Allergies  Allergen Reactions  . Cefaclor Rash    Because of a history of documented adverse serious drug reaction;Medi Alert bracelet  is recommended  . Penicillins     REACTION: rash Because of a history of documented adverse serious drug reaction;Medi Alert bracelet  is recommended Has patient had a PCN reaction causing immediate rash, facial/tongue/throat swelling, SOB or lightheadedness with hypotension: no Has patient had a PCN reaction causing severe rash involving mucus membranes or skin necrosis: {no Has patient had a PCN reaction that required hospitalization no Has patient had a PCN reaction occurring within the last 10 years: no If all of the above ans  . Sulfa Antibiotics Rash    01/22/13 after 9 days of Sulfa Because of a history of documented adverse serious drug reaction;Medi Alert bracelet  is recommended  .  Advair Diskus [Fluticasone-Salmeterol]     "Felt weird" and flushing.    Family History  Problem Relation Age of Onset  . Osteoporosis Mother   . Thyroid disease Mother   . Stroke Maternal Aunt         X 3   . Diabetes Maternal Aunt   . Heart attack Maternal Grandfather        > 70  . Melanoma Brother   . Multiple sclerosis Daughter 65  . Deep vein thrombosis Paternal Aunt   . Other Paternal Aunt        Phlebitis without deep venous thrombosis  . Colon cancer Neg Hx     Social History   Social History  . Marital status: Married    Spouse name: N/A  . Number of children: N/A    . Years of education: N/A   Social History Main Topics  . Smoking status: Never Smoker  . Smokeless tobacco: Never Used  . Alcohol use 0.0 oz/week     Comment:  very rarely  . Drug use: No  . Sexual activity: Yes    Birth control/ protection: Post-menopausal   Other Topics Concern  . None   Social History Narrative  . None   Review of Systems - See HPI.  All other ROS are negative.  BP 120/82   Pulse 72   Temp 97.7 F (36.5 C) (Oral)   Resp 14   Ht _0  (1.6 m)   Wt 209 lb (94.8 kg)   SpO2 97%   BMI 37.02 kg/m   Physical Exam  Constitutional: She is oriented to person, place, and time and well-developed, well-nourished, and in no distress.  HENT:  Head: Normocephalic and atraumatic.  Right Ear: Tympanic membrane normal.  Left Ear: Tympanic membrane is retracted (serous fluid noted).  Nose: Mucosal edema and rhinorrhea present. Right sinus exhibits no maxillary sinus tenderness and no frontal sinus tenderness. Left sinus exhibits no maxillary sinus tenderness and no frontal sinus tenderness.  Mouth/Throat: Uvula is midline, oropharynx is clear and moist and mucous membranes are normal.  Eyes: Conjunctivae are normal.  Cardiovascular: Normal rate, regular rhythm, normal heart sounds and intact distal pulses.   Pulmonary/Chest: Effort normal and breath sounds normal. No respiratory distress. She has no wheezes. She has no rales. She exhibits no tenderness.  Neurological: She is alert and oriented to person, place, and time.  Skin: Skin is warm and dry.  Psychiatric: Affect normal.  Vitals reviewed.  Recent Results (from the past 2160 hour(s))  APTT     Status: None   Collection Time: 08/21/17  9:33 AM  Result Value Ref Range   aPTT 32 24 - 36 seconds  CBC     Status: None   Collection Time: 08/21/17  9:33 AM  Result Value Ref Range   WBC 5.3 4.0 - 10.5 K/uL   RBC 4.37 3.87 - 5.11 MIL/uL   Hemoglobin 12.9 12.0 - 15.0 g/dL   HCT 38.7 36.0 - 46.0 %   MCV 88.6  78.0 - 100.0 fL   MCH 29.5 26.0 - 34.0 pg   MCHC 33.3 30.0 - 36.0 g/dL   RDW 13.3 11.5 - 15.5 %   Platelets 178 150 - 400 K/uL  Comprehensive metabolic panel     Status: Abnormal   Collection Time: 08/21/17  9:33 AM  Result Value Ref Range   Sodium 139 135 - 145 mmol/L   Potassium 4.5 3.5 - 5.1 mmol/L   Chloride 104  101 - 111 mmol/L   CO2 26 22 - 32 mmol/L   Glucose, Bld 91 65 - 99 mg/dL   BUN 17 6 - 20 mg/dL   Creatinine, Ser 1.16 (H) 0.44 - 1.00 mg/dL   Calcium 9.4 8.9 - 10.3 mg/dL   Total Protein 7.5 6.5 - 8.1 g/dL   Albumin 3.9 3.5 - 5.0 g/dL   AST 24 15 - 41 U/L   ALT 17 14 - 54 U/L   Alkaline Phosphatase 68 38 - 126 U/L   Total Bilirubin 0.7 0.3 - 1.2 mg/dL   GFR calc non Af Amer 47 (L) >60 mL/min   GFR calc Af Amer 54 (L) >60 mL/min    Comment: (NOTE) The eGFR has been calculated using the CKD EPI equation. This calculation has not been validated in all clinical situations. eGFR's persistently <60 mL/min signify possible Chronic Kidney Disease.    Anion gap 9 5 - 15  Protime-INR     Status: None   Collection Time: 08/21/17  9:33 AM  Result Value Ref Range   Prothrombin Time 13.0 11.4 - 15.2 seconds   INR 0.99   Type and screen Order type and screen if day of surgery is less than 15 days from draw of preadmission visit or order morning of surgery if day of surgery is greater than 6 days from preadmission visit.     Status: None   Collection Time: 08/21/17  9:33 AM  Result Value Ref Range   ABO/RH(D) A POS    Antibody Screen NEG    Sample Expiration 09/04/2017    Extend sample reason NO TRANSFUSIONS OR PREGNANCY IN THE PAST 3 MONTHS   Surgical pcr screen     Status: None   Collection Time: 08/21/17  9:33 AM  Result Value Ref Range   MRSA, PCR NEGATIVE NEGATIVE   Staphylococcus aureus NEGATIVE NEGATIVE    Comment: (NOTE) The Xpert SA Assay (FDA approved for NASAL specimens in patients 45 years of age and older), is one component of a comprehensive surveillance  program. It is not intended to diagnose infection nor to guide or monitor treatment.    Assessment/Plan: 1. Viral URI with cough Start supportive measures. OTC medications reviewed.Rx Astelin. Follow-up if symptoms are not resolving.    Leeanne Rio, PA-C

## 2017-08-24 NOTE — Patient Instructions (Signed)
We are sorry that you are not feeling well.  Here is how we plan to help!  Based on what you have shared with me it looks like you have sinusitis.  Sinusitis is inflammation and infection in the sinus cavities of the head.  Based on your presentation I believe you most likely have Acute Viral Sinusitis.This is an infection most likely caused by a virus. There is not specific treatment for viral sinusitis other than to help you with the symptoms until the infection runs its course.  Start Mucinex-DM. Saline nasal spray help and can safely be used as often as needed for congestion, I have prescribed: Azelastine nasal spray 2 sprays in each nostril twice a day  Some authorities believe that zinc sprays or the use of Echinacea may shorten the course of your symptoms.  Sinus infections are not as easily transmitted as other respiratory infection, however we still recommend that you avoid close contact with loved ones, especially the very young and elderly.  Remember to wash your hands thoroughly throughout the day as this is the number one way to prevent the spread of infection!  Home Care:  Only take medications as instructed by your medical team.  Complete the entire course of an antibiotic.  Do not take these medications with alcohol.  A steam or ultrasonic humidifier can help congestion.  You can place a towel over your head and breathe in the steam from hot water coming from a faucet.  Avoid close contacts especially the very young and the elderly.  Cover your mouth when you cough or sneeze.  Always remember to wash your hands.  Get Help Right Away If:  You develop worsening fever or sinus pain.  You develop a severe head ache or visual changes.  Your symptoms persist after you have completed your treatment plan.  Make sure you  Understand these instructions.  Will watch your condition.  Will get help right away if you are not doing well or get worse.

## 2017-08-26 ENCOUNTER — Ambulatory Visit: Payer: Self-pay | Admitting: Orthopedic Surgery

## 2017-08-26 NOTE — H&P (Signed)
Kristine Olson DOB: 08/18/1946 Married / Language: Cleophus Molt / Race: White Female Date of admission: August 27, 2017  Chief complaint: Left knee pain History of Present Illness The patient is a 71 year old female who comes in for a preoperative History and Physical. The patient is scheduled for a left total knee arthroplasty to be performed by Dr. Dione Plover. Aluisio, MD at [facility] on 08-27-2017. The patient is being followed for their left knee pain and osteoarthritis. They are now 1 1/2 years out from bilateral knee Euflexxa. Symptoms reported include: pain, aching and difficulty ambulating. The patient feels that they are doing poorly and report their pain level to be moderate. The following medication has been used for pain control: Tylenol (occasional). The patient has not gotten any relief of their symptoms with Cortisone injections or viscosupplementation. She is at a point where she wants to have the knee replaced. She would prefer to have a total knee replacement as opposed to a partial. She has some trouble with stairs and getting up from seated position. She is now ready to proceed with surgery at this time. They have been treated conservatively in the past for the above stated problem and despite conservative measures, they continue to have progressive pain and severe functional limitations and dysfunction. They have failed non-operative management including home exercise, medications, and injections. It is felt that they would benefit from undergoing total joint replacement. Risks and benefits of the procedure have been discussed with the patient and they elect to proceed with surgery. There are no active contraindications to surgery such as ongoing infection or rapidly progressive neurological disease.   Problem List/Past Medical Status post unicompartmental knee replacement, right (T73.220)  Primary osteoarthritis of right knee (M17.11)  Sleep Apnea  uses  CPAP Depression  Gastroesophageal Reflux Disease  High blood pressure  Hypothyroidism  Irritable bowel syndrome  Osteoporosis  Hemorrhoids  occasional bleeding Tinnitus  Varicose veins  Menopause  Osteopenia  Gastric Ulcer  Hiatal Hernia  Diverticulosis  Measles  Rubella     Allergies Penicillamine *Assorted Classes Rash. Ceclor *CEPHALOSPORINS*  Rash. Sulfamethoxazole *Sulfonamides**  Rash. Flonase *NASAL AGENTS - SYSTEMIC AND TOPICAL*  Did not tolerate  Family History Cancer  Brother, Maternal Grandmother, Paternal Grandmother. Drug / Alcohol Addiction  Father. Heart Disease  Maternal Grandfather. Hypertension  Father, Mother. Liver Disease, Chronic  Father. Osteoarthritis  Maternal Grandmother. Osteoporosis  Mother. Rheumatoid Arthritis  Maternal Grandmother, Mother. Father  Deceased. age 19 Mother  Deceased. age 30  Social History Children  5 or more Tobacco use  Never smoker. 06/21/2015 Current drinker  06/21/2015: Currently drinks wine only occasionally per week Not under pain contract  Current work status  retired Furniture conservator/restorer rarely; does running / walking Living situation  live with spouse Marital status  married No history of drug/alcohol rehab  Number of flights of stairs before winded  2-3 [place] with [place].  Medication History  Vitamin D (1000UNIT Tablet, Oral) Active. Levothyroxine Sodium (112MCG Tablet, Oral) Active. Metoprolol Tartrate (25MG  Tablet, Oral) Active. Spironolactone (25MG  Tablet, Oral) Active. CeleXA (10MG  Tablet, Oral) Active. Protonix (40MG  Packet, Oral) Active.   Past Surgical History Breast Biopsy  bilateral Cesarean Delivery  Date: 1982. 1 time Colon Polyp Removal - Colonoscopy  Tonsillectomy  Date: 1955.   Review of Systems  General Not Present- Chills, Fatigue, Fever, Memory  Loss, Night Sweats, Weight Gain and Weight Loss. Skin Not  Present- Eczema, Hives, Itching, Lesions and Rash. HEENT Not Present- Dentures, Double Vision, Headache, Hearing Loss, Tinnitus and Visual Loss. Respiratory Not Present- Allergies, Chronic Cough, Coughing up blood, Shortness of breath at rest and Shortness of breath with exertion. Cardiovascular Not Present- Chest Pain, Difficulty Breathing Lying Down, Murmur, Palpitations, Racing/skipping heartbeats and Swelling. Gastrointestinal Present- Heartburn. Not Present- Abdominal Pain, Bloody Stool, Constipation, Diarrhea, Difficulty Swallowing, Jaundice, Loss of appetitie, Nausea and Vomiting. Female Genitourinary Not Present- Blood in Urine, Discharge, Flank Pain, Incontinence, Painful Urination, Urgency, Urinary frequency, Urinary Retention, Urinating at Night and Weak urinary stream. Musculoskeletal Present- Joint Pain and Joint Swelling. Not Present- Back Pain, Morning Stiffness, Muscle Pain, Muscle Weakness and Spasms. Neurological Not Present- Blackout spells, Difficulty with balance, Dizziness, Paralysis, Tremor and Weakness. Psychiatric Not Present- Insomnia.  Vitals Weight: 200 lb Height: 62.5in Body Surface Area: 1.92 m Body Mass Index: 36 kg/m  Pulse: 76 (Regular)  BP: 132/80 (Sitting, Right Arm, Standard)   Physical Exam General Mental Status -Alert, cooperative and good historian. General Appearance-pleasant, Not in acute distress. Orientation-Oriented X3. Build & Nutrition-Well nourished and Well developed.  Head and Neck Head-normocephalic, atraumatic . Neck Global Assessment - supple, no bruit auscultated on the right, no bruit auscultated on the left.  Eye Vision-Wears corrective lenses. Pupil - Bilateral-Regular and Round. Motion - Bilateral-EOMI.  Chest and Lung Exam Auscultation Breath sounds - clear at anterior chest wall and clear at posterior chest wall. Adventitious sounds - No  Adventitious sounds.  Cardiovascular Auscultation Rhythm - Regular rate and rhythm. Heart Sounds - S1 WNL and S2 WNL. Murmurs & Other Heart Sounds - Auscultation of the heart reveals - No Murmurs.  Abdomen Palpation/Percussion Tenderness - Abdomen is non-tender to palpation. Rigidity (guarding) - Abdomen is soft. Auscultation Auscultation of the abdomen reveals - Bowel sounds normal.  Female Genitourinary Note: Not done, not pertinent to present illness   Musculoskeletal Note: Her RIGHT knee shows no swelling. Range of motion 0-130. She has some tenderness around the pes bursa but no other tenderness noted. There is no instability. Her LEFT knee shows no effusion. There is marked crepitus on range of motion. She has tenderness medial greater than lateral with no instability noted. Pulses sensation and motor are intact both lower extremities.  Radiographs AP and lateral both knees showed a unicompartmental replacement on the RIGHT in excellent position with no periprosthetic abnormalities. On the LEFT she has bone-on-bone arthritis medial with patellofemoral involvement also. There is tibial subluxation.   Assessment & Plan  Primary osteoarthritis of left knee (M17.12)  Note:Surgical Plans: Left Total Knee Replacement  Disposition: Home, HHPT  PCP: Dr. Birdie Riddle - Patient has been seen preoperatively and felt to be stable for surgery.  IV TXA  Anesthesia Issues: None  Patient was instructed on what medications to stop prior to surgery.  Signed electronically by Joelene Millin, III PA-C

## 2017-08-26 NOTE — H&P (View-Only) (Signed)
Kristine Olson DOB: [birth year] Married / Language: Cleophus Molt / Race: White Female Date of admission: August 27, 2017  Chief complaint: Left knee pain History of Present Illness The patient is a 71 year old female who comes in for a preoperative History and Physical. The patient is scheduled for a left total knee arthroplasty to be performed by Dr. Dione Plover. Aluisio, MD at [facility] on 08-27-2017. The patient is being followed for their left knee pain and osteoarthritis. They are now 1 1/2 years out from bilateral knee Euflexxa. Symptoms reported include: pain, aching and difficulty ambulating. The patient feels that they are doing poorly and report their pain level to be moderate. The following medication has been used for pain control: Tylenol (occasional). The patient has not gotten any relief of their symptoms with Cortisone injections or viscosupplementation. She is at a point where she wants to have the knee replaced. She would prefer to have a total knee replacement as opposed to a partial. She has some trouble with stairs and getting up from seated position. She is now ready to proceed with surgery at this time. They have been treated conservatively in the past for the above stated problem and despite conservative measures, they continue to have progressive pain and severe functional limitations and dysfunction. They have failed non-operative management including home exercise, medications, and injections. It is felt that they would benefit from undergoing total joint replacement. Risks and benefits of the procedure have been discussed with the patient and they elect to proceed with surgery. There are no active contraindications to surgery such as ongoing infection or rapidly progressive neurological disease.   Problem List/Past Medical Status post unicompartmental knee replacement, right (U72.536)  Primary osteoarthritis of right knee (M17.11)  Sleep Apnea  uses  CPAP Depression  Gastroesophageal Reflux Disease  High blood pressure  Hypothyroidism  Irritable bowel syndrome  Osteoporosis  Hemorrhoids  occasional bleeding Tinnitus  Varicose veins  Menopause  Osteopenia  Gastric Ulcer  Hiatal Hernia  Diverticulosis  Measles  Rubella     Allergies Penicillamine *Assorted Classes Rash. Ceclor *CEPHALOSPORINS*  Rash. Sulfamethoxazole *Sulfonamides**  Rash. Flonase *NASAL AGENTS - SYSTEMIC AND TOPICAL*  Did not tolerate  Family History Cancer  Brother, Maternal Grandmother, Paternal Grandmother. Drug / Alcohol Addiction  Father. Heart Disease  Maternal Grandfather. Hypertension  Father, Mother. Liver Disease, Chronic  Father. Osteoarthritis  Maternal Grandmother. Osteoporosis  Mother. Rheumatoid Arthritis  Maternal Grandmother, Mother. Father  Deceased. age 51 Mother  Deceased. age 43  Social History Children  5 or more Tobacco use  Never smoker. 06/21/2015 Current drinker  06/21/2015: Currently drinks wine only occasionally per week Not under pain contract  Current work status  retired Furniture conservator/restorer rarely; does running / walking Living situation  live with spouse Marital status  married No history of drug/alcohol rehab  Number of flights of stairs before winded  2-3 [place] with [place].  Medication History  Vitamin D (1000UNIT Tablet, Oral) Active. Levothyroxine Sodium (112MCG Tablet, Oral) Active. Metoprolol Tartrate (25MG  Tablet, Oral) Active. Spironolactone (25MG  Tablet, Oral) Active. CeleXA (10MG  Tablet, Oral) Active. Protonix (40MG  Packet, Oral) Active.   Past Surgical History Breast Biopsy  bilateral Cesarean Delivery  Date: 1982. 1 time Colon Polyp Removal - Colonoscopy  Tonsillectomy  Date: 1955.   Review of Systems  General Not Present- Chills, Fatigue, Fever, Memory  Loss, Night Sweats, Weight Gain and Weight Loss. Skin Not  Present- Eczema, Hives, Itching, Lesions and Rash. HEENT Not Present- Dentures, Double Vision, Headache, Hearing Loss, Tinnitus and Visual Loss. Respiratory Not Present- Allergies, Chronic Cough, Coughing up blood, Shortness of breath at rest and Shortness of breath with exertion. Cardiovascular Not Present- Chest Pain, Difficulty Breathing Lying Down, Murmur, Palpitations, Racing/skipping heartbeats and Swelling. Gastrointestinal Present- Heartburn. Not Present- Abdominal Pain, Bloody Stool, Constipation, Diarrhea, Difficulty Swallowing, Jaundice, Loss of appetitie, Nausea and Vomiting. Female Genitourinary Not Present- Blood in Urine, Discharge, Flank Pain, Incontinence, Painful Urination, Urgency, Urinary frequency, Urinary Retention, Urinating at Night and Weak urinary stream. Musculoskeletal Present- Joint Pain and Joint Swelling. Not Present- Back Pain, Morning Stiffness, Muscle Pain, Muscle Weakness and Spasms. Neurological Not Present- Blackout spells, Difficulty with balance, Dizziness, Paralysis, Tremor and Weakness. Psychiatric Not Present- Insomnia.  Vitals Weight: 200 lb Height: 62.5in Body Surface Area: 1.92 m Body Mass Index: 36 kg/m  Pulse: 76 (Regular)  BP: 132/80 (Sitting, Right Arm, Standard)   Physical Exam General Mental Status -Alert, cooperative and good historian. General Appearance-pleasant, Not in acute distress. Orientation-Oriented X3. Build & Nutrition-Well nourished and Well developed.  Head and Neck Head-normocephalic, atraumatic . Neck Global Assessment - supple, no bruit auscultated on the right, no bruit auscultated on the left.  Eye Vision-Wears corrective lenses. Pupil - Bilateral-Regular and Round. Motion - Bilateral-EOMI.  Chest and Lung Exam Auscultation Breath sounds - clear at anterior chest wall and clear at posterior chest wall. Adventitious sounds - No  Adventitious sounds.  Cardiovascular Auscultation Rhythm - Regular rate and rhythm. Heart Sounds - S1 WNL and S2 WNL. Murmurs & Other Heart Sounds - Auscultation of the heart reveals - No Murmurs.  Abdomen Palpation/Percussion Tenderness - Abdomen is non-tender to palpation. Rigidity (guarding) - Abdomen is soft. Auscultation Auscultation of the abdomen reveals - Bowel sounds normal.  Female Genitourinary Note: Not done, not pertinent to present illness   Musculoskeletal Note: Her RIGHT knee shows no swelling. Range of motion 0-130. She has some tenderness around the pes bursa but no other tenderness noted. There is no instability. Her LEFT knee shows no effusion. There is marked crepitus on range of motion. She has tenderness medial greater than lateral with no instability noted. Pulses sensation and motor are intact both lower extremities.  Radiographs AP and lateral both knees showed a unicompartmental replacement on the RIGHT in excellent position with no periprosthetic abnormalities. On the LEFT she has bone-on-bone arthritis medial with patellofemoral involvement also. There is tibial subluxation.   Assessment & Plan  Primary osteoarthritis of left knee (M17.12)  Note:Surgical Plans: Left Total Knee Replacement  Disposition: Home, HHPT  PCP: Dr. Birdie Riddle - Patient has been seen preoperatively and felt to be stable for surgery.  IV TXA  Anesthesia Issues: None  Patient was instructed on what medications to stop prior to surgery.  Signed electronically by Joelene Millin, III PA-C

## 2017-08-27 ENCOUNTER — Encounter (HOSPITAL_COMMUNITY): Payer: Self-pay | Admitting: *Deleted

## 2017-08-27 ENCOUNTER — Inpatient Hospital Stay (HOSPITAL_COMMUNITY)
Admission: RE | Admit: 2017-08-27 | Discharge: 2017-08-29 | DRG: 470 | Disposition: A | Discharge status: Home Health | Payer: Medicare Other | Source: Ambulatory Visit | Attending: Orthopedic Surgery | Admitting: Orthopedic Surgery

## 2017-08-27 ENCOUNTER — Inpatient Hospital Stay (HOSPITAL_COMMUNITY): Payer: Medicare Other | Admitting: Anesthesiology

## 2017-08-27 ENCOUNTER — Other Ambulatory Visit: Payer: Self-pay

## 2017-08-27 ENCOUNTER — Encounter (HOSPITAL_COMMUNITY)
Admission: RE | Disposition: A | Discharge status: Home Health | Payer: Self-pay | Source: Ambulatory Visit | Attending: Orthopedic Surgery

## 2017-08-27 DIAGNOSIS — E785 Hyperlipidemia, unspecified: Secondary | ICD-10-CM | POA: Diagnosis present

## 2017-08-27 DIAGNOSIS — M1712 Unilateral primary osteoarthritis, left knee: Secondary | ICD-10-CM

## 2017-08-27 DIAGNOSIS — M179 Osteoarthritis of knee, unspecified: Secondary | ICD-10-CM | POA: Diagnosis present

## 2017-08-27 DIAGNOSIS — K219 Gastro-esophageal reflux disease without esophagitis: Secondary | ICD-10-CM | POA: Diagnosis present

## 2017-08-27 DIAGNOSIS — Z6837 Body mass index (BMI) 37.0-37.9, adult: Secondary | ICD-10-CM

## 2017-08-27 DIAGNOSIS — Z8261 Family history of arthritis: Secondary | ICD-10-CM | POA: Diagnosis not present

## 2017-08-27 DIAGNOSIS — I10 Essential (primary) hypertension: Secondary | ICD-10-CM | POA: Diagnosis present

## 2017-08-27 DIAGNOSIS — Z880 Allergy status to penicillin: Secondary | ICD-10-CM

## 2017-08-27 DIAGNOSIS — H9319 Tinnitus, unspecified ear: Secondary | ICD-10-CM | POA: Diagnosis present

## 2017-08-27 DIAGNOSIS — Z882 Allergy status to sulfonamides status: Secondary | ICD-10-CM | POA: Diagnosis not present

## 2017-08-27 DIAGNOSIS — K649 Unspecified hemorrhoids: Secondary | ICD-10-CM | POA: Diagnosis present

## 2017-08-27 DIAGNOSIS — K589 Irritable bowel syndrome without diarrhea: Secondary | ICD-10-CM | POA: Diagnosis present

## 2017-08-27 DIAGNOSIS — M1710 Unilateral primary osteoarthritis, unspecified knee: Secondary | ICD-10-CM | POA: Diagnosis present

## 2017-08-27 DIAGNOSIS — I8390 Asymptomatic varicose veins of unspecified lower extremity: Secondary | ICD-10-CM | POA: Diagnosis present

## 2017-08-27 DIAGNOSIS — Z96651 Presence of right artificial knee joint: Secondary | ICD-10-CM | POA: Diagnosis present

## 2017-08-27 DIAGNOSIS — Z79899 Other long term (current) drug therapy: Secondary | ICD-10-CM

## 2017-08-27 DIAGNOSIS — G4730 Sleep apnea, unspecified: Secondary | ICD-10-CM | POA: Diagnosis present

## 2017-08-27 DIAGNOSIS — Z8249 Family history of ischemic heart disease and other diseases of the circulatory system: Secondary | ICD-10-CM

## 2017-08-27 DIAGNOSIS — Z888 Allergy status to other drugs, medicaments and biological substances status: Secondary | ICD-10-CM

## 2017-08-27 DIAGNOSIS — Z8262 Family history of osteoporosis: Secondary | ICD-10-CM

## 2017-08-27 DIAGNOSIS — Z8711 Personal history of peptic ulcer disease: Secondary | ICD-10-CM | POA: Diagnosis not present

## 2017-08-27 DIAGNOSIS — F329 Major depressive disorder, single episode, unspecified: Secondary | ICD-10-CM | POA: Diagnosis present

## 2017-08-27 DIAGNOSIS — E039 Hypothyroidism, unspecified: Secondary | ICD-10-CM | POA: Diagnosis present

## 2017-08-27 DIAGNOSIS — M810 Age-related osteoporosis without current pathological fracture: Secondary | ICD-10-CM | POA: Diagnosis present

## 2017-08-27 DIAGNOSIS — E669 Obesity, unspecified: Secondary | ICD-10-CM | POA: Diagnosis present

## 2017-08-27 DIAGNOSIS — Z86010 Personal history of colonic polyps: Secondary | ICD-10-CM

## 2017-08-27 HISTORY — PX: TOTAL KNEE ARTHROPLASTY: SHX125

## 2017-08-27 LAB — TYPE AND SCREEN
ABO/RH(D): A POS
ANTIBODY SCREEN: NEGATIVE

## 2017-08-27 SURGERY — ARTHROPLASTY, KNEE, TOTAL
Anesthesia: Spinal | Site: Knee | Laterality: Left

## 2017-08-27 MED ORDER — MENTHOL 3 MG MT LOZG: 1.0000 | LOZENGE | OROMUCOSAL | Status: DC | PRN

## 2017-08-27 MED ORDER — ACETAMINOPHEN 650 MG RE SUPP: 650.0000 mg | RECTAL | Status: DC | PRN

## 2017-08-27 MED ORDER — SODIUM CHLORIDE 0.9 % IJ SOLN
INTRAMUSCULAR | Status: AC
  Filled 2017-08-27: qty 50

## 2017-08-27 MED ORDER — EPHEDRINE 5 MG/ML INJ
INTRAVENOUS | Status: AC
  Filled 2017-08-27: qty 10

## 2017-08-27 MED ORDER — PROPOFOL 500 MG/50ML IV EMUL
INTRAVENOUS | Status: DC | PRN
  Administered 2017-08-27: 50 ug/kg/min via INTRAVENOUS

## 2017-08-27 MED ORDER — FLEET ENEMA 7-19 GM/118ML RE ENEM: 1.0000 | ENEMA | Freq: Once | RECTAL | Status: DC | PRN

## 2017-08-27 MED ORDER — METOCLOPRAMIDE HCL 5 MG/ML IJ SOLN: 5.0000 mg | Freq: Three times a day (TID) | INTRAMUSCULAR | Status: DC | PRN

## 2017-08-27 MED ORDER — BUPIVACAINE LIPOSOME 1.3 % IJ SUSP
20.0000 mL | Freq: Once | INTRAMUSCULAR | Status: DC
  Filled 2017-08-27: qty 20

## 2017-08-27 MED ORDER — LIDOCAINE 2% (20 MG/ML) 5 ML SYRINGE
INTRAMUSCULAR | Status: AC
  Filled 2017-08-27: qty 5

## 2017-08-27 MED ORDER — PANTOPRAZOLE SODIUM 40 MG PO TBEC
40.0000 mg | DELAYED_RELEASE_TABLET | ORAL | Status: DC
  Administered 2017-08-29: 08:00:00 40 mg via ORAL
  Filled 2017-08-27: qty 1

## 2017-08-27 MED ORDER — PROPOFOL 10 MG/ML IV BOLUS
INTRAVENOUS | Status: DC | PRN
  Administered 2017-08-27: 20 mg via INTRAVENOUS

## 2017-08-27 MED ORDER — LIDOCAINE 2% (20 MG/ML) 5 ML SYRINGE
INTRAMUSCULAR | Status: DC | PRN
  Administered 2017-08-27: 40 mg via INTRAVENOUS

## 2017-08-27 MED ORDER — DEXAMETHASONE SODIUM PHOSPHATE 10 MG/ML IJ SOLN
INTRAMUSCULAR | Status: AC
  Filled 2017-08-27: qty 1

## 2017-08-27 MED ORDER — FENTANYL CITRATE (PF) 100 MCG/2ML IJ SOLN
INTRAMUSCULAR | Status: AC
  Administered 2017-08-27: 50 ug via INTRAVENOUS
  Filled 2017-08-27: qty 2

## 2017-08-27 MED ORDER — FENTANYL CITRATE (PF) 100 MCG/2ML IJ SOLN
50.0000 ug | INTRAMUSCULAR | Status: DC
  Administered 2017-08-27: 50 ug via INTRAVENOUS

## 2017-08-27 MED ORDER — CITALOPRAM HYDROBROMIDE 20 MG PO TABS
20.0000 mg | ORAL_TABLET | Freq: Every day | ORAL | Status: DC
  Administered 2017-08-27 – 2017-08-28 (×2): 20 mg via ORAL
  Filled 2017-08-27 (×2): qty 1

## 2017-08-27 MED ORDER — ONDANSETRON HCL 4 MG PO TABS: 4.0000 mg | ORAL_TABLET | Freq: Four times a day (QID) | ORAL | Status: DC | PRN

## 2017-08-27 MED ORDER — TRANEXAMIC ACID 1000 MG/10ML IV SOLN
1000.0000 mg | Freq: Once | INTRAVENOUS | Status: AC
  Administered 2017-08-27: 17:00:00 1000 mg via INTRAVENOUS
  Filled 2017-08-27: qty 1100

## 2017-08-27 MED ORDER — RIVAROXABAN 10 MG PO TABS
10.0000 mg | ORAL_TABLET | Freq: Every day | ORAL | Status: DC
  Filled 2017-08-27: qty 1

## 2017-08-27 MED ORDER — VANCOMYCIN HCL IN DEXTROSE 1-5 GM/200ML-% IV SOLN
1000.0000 mg | Freq: Two times a day (BID) | INTRAVENOUS | Status: AC
  Administered 2017-08-28: 1000 mg via INTRAVENOUS
  Filled 2017-08-27: qty 200

## 2017-08-27 MED ORDER — DOCUSATE SODIUM 100 MG PO CAPS
100.0000 mg | ORAL_CAPSULE | Freq: Two times a day (BID) | ORAL | Status: DC
  Administered 2017-08-27 – 2017-08-29 (×4): 100 mg via ORAL
  Filled 2017-08-27 (×4): qty 1

## 2017-08-27 MED ORDER — VANCOMYCIN HCL IN DEXTROSE 1-5 GM/200ML-% IV SOLN
1000.0000 mg | INTRAVENOUS | Status: AC
  Administered 2017-08-27: 1000 mg via INTRAVENOUS

## 2017-08-27 MED ORDER — ACETAMINOPHEN 10 MG/ML IV SOLN
INTRAVENOUS | Status: AC
  Filled 2017-08-27: qty 100

## 2017-08-27 MED ORDER — MIDAZOLAM HCL 2 MG/2ML IJ SOLN
1.0000 mg | INTRAMUSCULAR | Status: DC
  Administered 2017-08-27: 1 mg via INTRAVENOUS

## 2017-08-27 MED ORDER — 0.9 % SODIUM CHLORIDE (POUR BTL) OPTIME
TOPICAL | Status: DC | PRN
  Administered 2017-08-27: 1000 mL

## 2017-08-27 MED ORDER — EPHEDRINE SULFATE-NACL 50-0.9 MG/10ML-% IV SOSY
PREFILLED_SYRINGE | INTRAVENOUS | Status: DC | PRN
  Administered 2017-08-27: 20 mg via INTRAVENOUS

## 2017-08-27 MED ORDER — VANCOMYCIN HCL IN DEXTROSE 1-5 GM/200ML-% IV SOLN
INTRAVENOUS | Status: AC
  Administered 2017-08-27: 1000 mg via INTRAVENOUS
  Filled 2017-08-27: qty 200

## 2017-08-27 MED ORDER — BUPIVACAINE LIPOSOME 1.3 % IJ SUSP
INTRAMUSCULAR | Status: DC | PRN
  Administered 2017-08-27: 20 mL

## 2017-08-27 MED ORDER — GABAPENTIN 300 MG PO CAPS
300.0000 mg | ORAL_CAPSULE | Freq: Once | ORAL | Status: AC
  Administered 2017-08-27: 300 mg via ORAL

## 2017-08-27 MED ORDER — PHENOL 1.4 % MT LIQD
1.0000 | OROMUCOSAL | Status: DC | PRN
  Filled 2017-08-27: qty 177

## 2017-08-27 MED ORDER — SODIUM CHLORIDE 0.9 % IR SOLN
Status: DC | PRN
  Administered 2017-08-27: 1000 mL

## 2017-08-27 MED ORDER — ONDANSETRON HCL 4 MG/2ML IJ SOLN: 4.0000 mg | Freq: Four times a day (QID) | INTRAMUSCULAR | Status: DC | PRN

## 2017-08-27 MED ORDER — LACTATED RINGERS IV SOLN
INTRAVENOUS | Status: DC
  Administered 2017-08-27 (×2): via INTRAVENOUS

## 2017-08-27 MED ORDER — LEVOTHYROXINE SODIUM 112 MCG PO TABS
112.0000 ug | ORAL_TABLET | Freq: Every day | ORAL | Status: DC
  Administered 2017-08-28 – 2017-08-29 (×2): 112 ug via ORAL
  Filled 2017-08-27 (×2): qty 1

## 2017-08-27 MED ORDER — DIPHENHYDRAMINE HCL 12.5 MG/5ML PO ELIX: 12.5000 mg | ORAL_SOLUTION | ORAL | Status: DC | PRN

## 2017-08-27 MED ORDER — MIDAZOLAM HCL 2 MG/2ML IJ SOLN
INTRAMUSCULAR | Status: AC
  Administered 2017-08-27: 1 mg via INTRAVENOUS
  Filled 2017-08-27: qty 2

## 2017-08-27 MED ORDER — TRANEXAMIC ACID 1000 MG/10ML IV SOLN
1000.0000 mg | INTRAVENOUS | Status: AC
  Administered 2017-08-27: 1000 mg via INTRAVENOUS
  Filled 2017-08-27: qty 1100

## 2017-08-27 MED ORDER — GABAPENTIN 300 MG PO CAPS
ORAL_CAPSULE | ORAL | Status: AC
  Administered 2017-08-27: 300 mg via ORAL
  Filled 2017-08-27: qty 1

## 2017-08-27 MED ORDER — SODIUM CHLORIDE 0.9 % IJ SOLN
INTRAMUSCULAR | Status: AC
  Filled 2017-08-27: qty 10

## 2017-08-27 MED ORDER — BUPIVACAINE IN DEXTROSE 0.75-8.25 % IT SOLN
INTRATHECAL | Status: DC | PRN
  Administered 2017-08-27: 2 mL via INTRATHECAL

## 2017-08-27 MED ORDER — ACETAMINOPHEN 10 MG/ML IV SOLN
1000.0000 mg | Freq: Once | INTRAVENOUS | Status: AC
  Administered 2017-08-27: 1000 mg via INTRAVENOUS

## 2017-08-27 MED ORDER — ONDANSETRON HCL 4 MG/2ML IJ SOLN: 4.0000 mg | Freq: Once | INTRAMUSCULAR | Status: DC | PRN

## 2017-08-27 MED ORDER — SPIRONOLACTONE 25 MG PO TABS
25.0000 mg | ORAL_TABLET | Freq: Every evening | ORAL | Status: DC
  Administered 2017-08-27 – 2017-08-28 (×2): 25 mg via ORAL
  Filled 2017-08-27 (×3): qty 1

## 2017-08-27 MED ORDER — ONDANSETRON HCL 4 MG/2ML IJ SOLN
INTRAMUSCULAR | Status: DC | PRN
  Administered 2017-08-27: 4 mg via INTRAVENOUS

## 2017-08-27 MED ORDER — METHOCARBAMOL 500 MG PO TABS
500.0000 mg | ORAL_TABLET | Freq: Four times a day (QID) | ORAL | Status: DC | PRN
Start: 2017-08-27 — End: 2017-08-29
  Administered 2017-08-27 – 2017-08-29 (×3): 500 mg via ORAL
  Filled 2017-08-27 (×3): qty 1

## 2017-08-27 MED ORDER — METOPROLOL TARTRATE 25 MG PO TABS
25.0000 mg | ORAL_TABLET | Freq: Every day | ORAL | Status: DC
  Administered 2017-08-28 – 2017-08-29 (×2): 25 mg via ORAL
  Filled 2017-08-27 (×2): qty 1

## 2017-08-27 MED ORDER — METOCLOPRAMIDE HCL 5 MG PO TABS: 5.0000 mg | ORAL_TABLET | Freq: Three times a day (TID) | ORAL | Status: DC | PRN

## 2017-08-27 MED ORDER — FENTANYL CITRATE (PF) 100 MCG/2ML IJ SOLN
INTRAMUSCULAR | Status: AC
  Filled 2017-08-27: qty 2

## 2017-08-27 MED ORDER — ONDANSETRON HCL 4 MG/2ML IJ SOLN
INTRAMUSCULAR | Status: AC
  Filled 2017-08-27: qty 2

## 2017-08-27 MED ORDER — OXYCODONE HCL 5 MG PO TABS
5.0000 mg | ORAL_TABLET | ORAL | Status: DC | PRN
  Administered 2017-08-27 – 2017-08-29 (×3): 5 mg via ORAL
  Filled 2017-08-27 (×3): qty 1

## 2017-08-27 MED ORDER — DEXTROSE 5 % IV SOLN
500.0000 mg | Freq: Four times a day (QID) | INTRAVENOUS | Status: DC | PRN
  Filled 2017-08-27: qty 5

## 2017-08-27 MED ORDER — SODIUM CHLORIDE 0.9 % IV SOLN
INTRAVENOUS | Status: DC
  Administered 2017-08-27: 17:00:00 via INTRAVENOUS

## 2017-08-27 MED ORDER — OXYCODONE HCL 5 MG PO TABS
10.0000 mg | ORAL_TABLET | ORAL | Status: DC | PRN
  Administered 2017-08-27 – 2017-08-28 (×2): 10 mg via ORAL
  Administered 2017-08-28: 12:00:00 5 mg via ORAL
  Administered 2017-08-28 – 2017-08-29 (×5): 10 mg via ORAL
  Filled 2017-08-27 (×9): qty 2

## 2017-08-27 MED ORDER — BISACODYL 10 MG RE SUPP: 10.0000 mg | Freq: Every day | RECTAL | Status: DC | PRN

## 2017-08-27 MED ORDER — FENTANYL CITRATE (PF) 100 MCG/2ML IJ SOLN: 25.0000 ug | INTRAMUSCULAR | Status: DC | PRN

## 2017-08-27 MED ORDER — OXYMETAZOLINE HCL 0.05 % NA SOLN
NASAL | Status: AC
  Administered 2017-08-27: 2
  Filled 2017-08-27: qty 15

## 2017-08-27 MED ORDER — LORATADINE 10 MG PO TABS
10.0000 mg | ORAL_TABLET | Freq: Every day | ORAL | Status: DC
  Administered 2017-08-28 – 2017-08-29 (×2): 10 mg via ORAL
  Filled 2017-08-27 (×2): qty 1

## 2017-08-27 MED ORDER — MORPHINE SULFATE (PF) 4 MG/ML IV SOLN
1.0000 mg | INTRAVENOUS | Status: DC | PRN
  Administered 2017-08-27 – 2017-08-29 (×2): 1 mg via INTRAVENOUS
  Filled 2017-08-27 (×2): qty 1

## 2017-08-27 MED ORDER — PANTOPRAZOLE SODIUM 20 MG PO TBEC
20.0000 mg | DELAYED_RELEASE_TABLET | ORAL | Status: DC
  Administered 2017-08-28: 13:00:00 20 mg via ORAL
  Filled 2017-08-27: qty 1

## 2017-08-27 MED ORDER — POLYETHYLENE GLYCOL 3350 17 G PO PACK: 17.0000 g | PACK | Freq: Every day | ORAL | Status: DC | PRN

## 2017-08-27 MED ORDER — PROPOFOL 10 MG/ML IV BOLUS
INTRAVENOUS | Status: AC
  Filled 2017-08-27: qty 40

## 2017-08-27 MED ORDER — DEXAMETHASONE SODIUM PHOSPHATE 10 MG/ML IJ SOLN
INTRAMUSCULAR | Status: DC | PRN
  Administered 2017-08-27: 10 mg via INTRAVENOUS

## 2017-08-27 MED ORDER — ACETAMINOPHEN 500 MG PO TABS
1000.0000 mg | ORAL_TABLET | Freq: Four times a day (QID) | ORAL | Status: AC
  Administered 2017-08-27 – 2017-08-28 (×4): 1000 mg via ORAL
  Filled 2017-08-27 (×4): qty 2

## 2017-08-27 MED ORDER — ACETAMINOPHEN 325 MG PO TABS: 650.0000 mg | ORAL_TABLET | ORAL | Status: DC | PRN

## 2017-08-27 MED ORDER — DEXAMETHASONE SODIUM PHOSPHATE 10 MG/ML IJ SOLN: 10.0000 mg | Freq: Once | INTRAMUSCULAR | Status: DC

## 2017-08-27 MED ORDER — CHLORHEXIDINE GLUCONATE 4 % EX LIQD: 60.0000 mL | Freq: Once | CUTANEOUS | Status: DC

## 2017-08-27 MED ORDER — SODIUM CHLORIDE 0.9 % IJ SOLN
INTRAMUSCULAR | Status: DC | PRN
  Administered 2017-08-27: 60 mL

## 2017-08-27 SURGICAL SUPPLY — 51 items
BAG DECANTER FOR FLEXI CONT (MISCELLANEOUS) IMPLANT
BAG ZIPLOCK 12X15 (MISCELLANEOUS) ×3 IMPLANT
BANDAGE ACE 6X5 VEL STRL LF (GAUZE/BANDAGES/DRESSINGS) ×3 IMPLANT
BLADE SAG 18X100X1.27 (BLADE) ×3 IMPLANT
BLADE SAW SGTL 11.0X1.19X90.0M (BLADE) ×3 IMPLANT
BOWL SMART MIX CTS (DISPOSABLE) ×3 IMPLANT
CAPT KNEE TOTAL 3 ATTUNE ×3 IMPLANT
CEMENT HV SMART SET (Cement) ×6 IMPLANT
CLOSURE WOUND 1/2 X4 (GAUZE/BANDAGES/DRESSINGS) ×2
COVER SURGICAL LIGHT HANDLE (MISCELLANEOUS) ×3 IMPLANT
CUFF TOURN SGL QUICK 34 (TOURNIQUET CUFF) ×2
CUFF TRNQT CYL 34X4X40X1 (TOURNIQUET CUFF) ×1 IMPLANT
DECANTER SPIKE VIAL GLASS SM (MISCELLANEOUS) ×3 IMPLANT
DRAPE U-SHAPE 47X51 STRL (DRAPES) ×3 IMPLANT
DRSG ADAPTIC 3X8 NADH LF (GAUZE/BANDAGES/DRESSINGS) ×3 IMPLANT
DRSG PAD ABDOMINAL 8X10 ST (GAUZE/BANDAGES/DRESSINGS) IMPLANT
DURAPREP 26ML APPLICATOR (WOUND CARE) ×3 IMPLANT
ELECT REM PT RETURN 15FT ADLT (MISCELLANEOUS) ×3 IMPLANT
EVACUATOR 1/8 PVC DRAIN (DRAIN) ×3 IMPLANT
GAUZE SPONGE 4X4 12PLY STRL (GAUZE/BANDAGES/DRESSINGS) ×3 IMPLANT
GLOVE BIO SURGEON STRL SZ7.5 (GLOVE) IMPLANT
GLOVE BIO SURGEON STRL SZ8 (GLOVE) ×3 IMPLANT
GLOVE BIOGEL PI IND STRL 6.5 (GLOVE) IMPLANT
GLOVE BIOGEL PI IND STRL 8 (GLOVE) ×1 IMPLANT
GLOVE BIOGEL PI INDICATOR 6.5 (GLOVE)
GLOVE BIOGEL PI INDICATOR 8 (GLOVE) ×2
GLOVE SURG SS PI 6.5 STRL IVOR (GLOVE) IMPLANT
GOWN STRL REUS W/TWL LRG LVL3 (GOWN DISPOSABLE) ×3 IMPLANT
GOWN STRL REUS W/TWL XL LVL3 (GOWN DISPOSABLE) IMPLANT
HANDPIECE INTERPULSE COAX TIP (DISPOSABLE) ×2
IMMOBILIZER KNEE 20 (SOFTGOODS) ×3 IMPLANT
IMMOBILIZER KNEE 20 THIGH 36 (SOFTGOODS) IMPLANT
MANIFOLD NEPTUNE II (INSTRUMENTS) ×3 IMPLANT
NS IRRIG 1000ML POUR BTL (IV SOLUTION) ×3 IMPLANT
PACK TOTAL KNEE CUSTOM (KITS) ×3 IMPLANT
PAD ABD 8X10 STRL (GAUZE/BANDAGES/DRESSINGS) ×3 IMPLANT
PADDING CAST COTTON 6X4 STRL (CAST SUPPLIES) ×6 IMPLANT
POSITIONER SURGICAL ARM (MISCELLANEOUS) ×3 IMPLANT
SET HNDPC FAN SPRY TIP SCT (DISPOSABLE) ×1 IMPLANT
STRIP CLOSURE SKIN 1/2X4 (GAUZE/BANDAGES/DRESSINGS) ×4 IMPLANT
SUT MNCRL AB 4-0 PS2 18 (SUTURE) ×3 IMPLANT
SUT STRATAFIX 0 PDS 27 VIOLET (SUTURE) ×3
SUT VIC AB 2-0 CT1 27 (SUTURE) ×6
SUT VIC AB 2-0 CT1 TAPERPNT 27 (SUTURE) ×3 IMPLANT
SUTURE STRATFX 0 PDS 27 VIOLET (SUTURE) ×1 IMPLANT
SYR 30ML LL (SYRINGE) ×3 IMPLANT
TRAY FOLEY CATH 14FR (SET/KITS/TRAYS/PACK) ×3 IMPLANT
TRAY FOLEY W/METER SILVER 16FR (SET/KITS/TRAYS/PACK) IMPLANT
WATER STERILE IRR 1000ML POUR (IV SOLUTION) ×6 IMPLANT
WRAP KNEE MAXI GEL POST OP (GAUZE/BANDAGES/DRESSINGS) ×3 IMPLANT
YANKAUER SUCT BULB TIP 10FT TU (MISCELLANEOUS) ×3 IMPLANT

## 2017-08-27 NOTE — Transfer of Care (Signed)
Immediate Anesthesia Transfer of Care Note  Patient: Kristine Olson  Procedure(s) Performed: LEFT TOTAL KNEE ARTHROPLASTY (Left Knee)  Patient Location: PACU  Anesthesia Type:Spinal  Level of Consciousness: awake, alert  and oriented  Airway & Oxygen Therapy: Patient Spontanous Breathing and Patient connected to face mask oxygen  Post-op Assessment: Report given to RN and Post -op Vital signs reviewed and stable  Post vital signs: Reviewed and stable  Last Vitals:  Vitals:   08/27/17 1216 08/27/17 1221  BP: 127/83 126/71  Pulse: 68 66  Resp: 11 14  Temp:    SpO2: 100% 98%    Last Pain:  Vitals:   08/27/17 1101  TempSrc: Oral      Patients Stated Pain Goal: 3 (88/75/79 [id number])  Complications: No apparent anesthesia complications

## 2017-08-27 NOTE — Discharge Instructions (Addendum)
° °Dr. Frank Aluisio °Total Joint Specialist °[facility] °[address] °[place], [place] [address] °[phone] ° °TOTAL KNEE REPLACEMENT POSTOPERATIVE DIRECTIONS ° °Knee Rehabilitation, Guidelines Following Surgery  °Results after knee surgery are often greatly improved when you follow the exercise, range of motion and muscle strengthening exercises prescribed by your doctor. Safety measures are also important to protect the knee from further injury. Any time any of these exercises cause you to have increased pain or swelling in your knee joint, decrease the amount until you are comfortable again and slowly increase them. If you have problems or questions, call your caregiver or physical therapist for advice.  ° °HOME CARE INSTRUCTIONS  °Remove items at home which could result in a fall. This includes throw rugs or furniture in walking pathways.  °· ICE to the affected knee every three hours for 30 minutes at a time and then as needed for pain and swelling.  Continue to use ice on the knee for pain and swelling from surgery. You may notice swelling that will progress down to the foot and ankle.  This is normal after surgery.  Elevate the leg when you are not up walking on it.   °· Continue to use the breathing machine which will help keep your temperature down.  It is common for your temperature to cycle up and down following surgery, especially at night when you are not up moving around and exerting yourself.  The breathing machine keeps your lungs expanded and your temperature down. °· Do not place pillow under knee, focus on keeping the knee straight while resting ° °DIET °You may resume your previous home diet once your are discharged from the hospital. ° °DRESSING / WOUND CARE / SHOWERING °You may shower 3 days after surgery, but keep the wounds dry during showering.  You may use an occlusive plastic wrap (Press'n Seal for example), NO SOAKING/SUBMERGING IN THE BATHTUB.  If the  bandage gets wet, change with a clean dry gauze.  If the incision gets wet, pat the wound dry with a clean towel. °You may start showering once you are discharged home but do not submerge the incision under water. Just pat the incision dry and apply a dry gauze dressing on daily. °Change the surgical dressing daily and reapply a dry dressing each time. ° °ACTIVITY °Walk with your walker as instructed. °Use walker as long as suggested by your caregivers. °Avoid periods of inactivity such as sitting longer than an hour when not asleep. This helps prevent blood clots.  °You may resume a sexual relationship in one month or when given the OK by your doctor.  °You may return to work once you are cleared by your doctor.  °Do not drive a car for 6 weeks or until released by you surgeon.  °Do not drive while taking narcotics. ° °WEIGHT BEARING °Weight bearing as tolerated with assist device (walker, cane, etc) as directed, use it as long as suggested by your surgeon or therapist, typically at least 4-6 weeks. ° °POSTOPERATIVE CONSTIPATION PROTOCOL °Constipation - defined medically as fewer than three stools per week and severe constipation as less than one stool per week. ° °One of the most common issues patients have following surgery is constipation.  Even if you have a regular bowel pattern at home, your normal regimen is likely to be disrupted due to multiple reasons following surgery.  Combination of anesthesia, postoperative narcotics, change in appetite and fluid intake all can affect your bowels.    In order to avoid complications following surgery, here are some recommendations in order to help you during your recovery period. ° °Colace (docusate) - Pick up an over-the-counter form of Colace or another stool softener and take twice a day as long as you are requiring postoperative pain medications.  Take with a full glass of water daily.  If you experience loose stools or diarrhea, hold the colace until you stool forms  back up.  If your symptoms do not get better within 1 week or if they get worse, check with your doctor. ° °Dulcolax (bisacodyl) - Pick up over-the-counter and take as directed by the product packaging as needed to assist with the movement of your bowels.  Take with a full glass of water.  Use this product as needed if not relieved by Colace only.  ° °MiraLax (polyethylene glycol) - Pick up over-the-counter to have on hand.  MiraLax is a solution that will increase the amount of water in your bowels to assist with bowel movements.  Take as directed and can mix with a glass of water, juice, soda, coffee, or tea.  Take if you go more than two days without a movement. °Do not use MiraLax more than once per day. Call your doctor if you are still constipated or irregular after using this medication for 7 days in a row. ° °If you continue to have problems with postoperative constipation, please contact the office for further assistance and recommendations.  If you experience "the worst abdominal pain ever" or develop nausea or vomiting, please contact the office immediatly for further recommendations for treatment. ° °ITCHING ° If you experience itching with your medications, try taking only a single pain pill, or even half a pain pill at a time.  You can also use Benadryl over the counter for itching or also to help with sleep.  ° °TED HOSE STOCKINGS °Wear the elastic stockings on both legs for three weeks following surgery during the day but you may remove then at night for sleeping. ° °MEDICATIONS °See your medication summary on the “After Visit Summary” that the nursing staff will review with you prior to discharge.  You may have some home medications which will be placed on hold until you complete the course of blood thinner medication.  It is important for you to complete the blood thinner medication as prescribed by your surgeon.  Continue your approved medications as instructed at time of  discharge. ° °PRECAUTIONS °If you experience chest pain or shortness of breath - call 911 immediately for transfer to the hospital emergency department.  °If you develop a fever greater that 101 F, purulent drainage from wound, increased redness or drainage from wound, foul odor from the wound/dressing, or calf pain - CONTACT YOUR SURGEON.   °                                                °FOLLOW-UP APPOINTMENTS °Make sure you keep all of your appointments after your operation with your surgeon and caregivers. You should call the office at the above phone number and make an appointment for approximately two weeks after the date of your surgery or on the date instructed by your surgeon outlined in the "After Visit Summary". ° ° °RANGE OF MOTION AND STRENGTHENING EXERCISES  °Rehabilitation of the knee is important following a knee injury or   an operation. After just a few days of immobilization, the muscles of the thigh which control the knee become weakened and shrink (atrophy). Knee exercises are designed to build up the tone and strength of the thigh muscles and to improve knee motion. Often times heat used for twenty to thirty minutes before working out will loosen up your tissues and help with improving the range of motion but do not use heat for the first two weeks following surgery. These exercises can be done on a training (exercise) mat, on the floor, on a table or on a bed. Use what ever works the best and is most comfortable for you Knee exercises include:  Leg Lifts - While your knee is still immobilized in a splint or cast, you can do straight leg raises. Lift the leg to 60 degrees, hold for 3 sec, and slowly lower the leg. Repeat 10-20 times 2-3 times daily. Perform this exercise against resistance later as your knee gets better.  Quad and Hamstring Sets - Tighten up the muscle on the front of the thigh (Quad) and hold for 5-10 sec. Repeat this 10-20 times hourly. Hamstring sets are done by pushing the  foot backward against an object and holding for 5-10 sec. Repeat as with quad sets.   Leg Slides: Lying on your back, slowly slide your foot toward your buttocks, bending your knee up off the floor (only go as far as is comfortable). Then slowly slide your foot back down until your leg is flat on the floor again.  Angel Wings: Lying on your back spread your legs to the side as far apart as you can without causing discomfort.  A rehabilitation program following serious knee injuries can speed recovery and prevent re-injury in the future due to weakened muscles. Contact your doctor or a physical therapist for more information on knee rehabilitation.   IF YOU ARE TRANSFERRED TO A SKILLED REHAB FACILITY If the patient is transferred to a skilled rehab facility following release from the hospital, a list of the current medications will be sent to the facility for the patient to continue.  When discharged from the skilled rehab facility, please have the facility set up the patient's [place] prior to being released. Also, the skilled facility will be responsible for providing the patient with their medications at time of release from the facility to include their pain medication, the muscle relaxants, and their blood thinner medication. If the patient is still at the rehab facility at time of the two week follow up appointment, the skilled rehab facility will also need to assist the patient in arranging follow up appointment in our office and any transportation needs.  MAKE SURE YOU:  Understand these instructions.  Get help right away if you are not doing well or get worse.    Pick up stool softner and laxative for home use following surgery while on pain medications. Do not submerge incision under water. Please use good hand washing techniques while changing dressing each day. May shower starting three days after surgery. Please use a clean towel to pat the incision dry following  showers. Continue to use ice for pain and swelling after surgery. Do not use any lotions or creams on the incision until instructed by your surgeon.

## 2017-08-27 NOTE — Anesthesia Postprocedure Evaluation (Signed)
Anesthesia Post Note  Patient: Kristine Olson  Procedure(s) Performed: LEFT TOTAL KNEE ARTHROPLASTY (Left Knee)     Patient location during evaluation: PACU Anesthesia Type: Spinal Level of consciousness: oriented, awake and alert and awake Pain management: pain level controlled Vital Signs Assessment: post-procedure vital signs reviewed and stable Respiratory status: spontaneous breathing, respiratory function stable and patient connected to nasal cannula oxygen Cardiovascular status: blood pressure returned to baseline and stable Postop Assessment: no headache, no backache, no apparent nausea or vomiting and spinal receding Anesthetic complications: no    Last Vitals:  Vitals:   08/27/17 1550 08/27/17 1653  BP: 125/77 127/69  Pulse: 70 69  Resp: 16 16  Temp: 36.4 C 36.7 C  SpO2: 96% 98%    Last Pain:  Vitals:   08/27/17 1653  TempSrc: Oral                 Catalina Gravel

## 2017-08-27 NOTE — Anesthesia Procedure Notes (Signed)
Spinal  Patient location during procedure: OR Start time: 08/27/2017 12:34 PM End time: 08/27/2017 12:37 PM Staffing Anesthesiologist: Catalina Gravel, MD Performed: anesthesiologist  Preanesthetic Checklist Completed: patient identified, surgical consent, pre-op evaluation, timeout performed, IV checked, risks and benefits discussed and monitors and equipment checked Spinal Block Patient position: sitting Prep: site prepped and draped and DuraPrep Patient monitoring: continuous pulse ox and blood pressure Approach: midline Location: L3-4 Injection technique: single-shot Needle Needle type: Pencan  Needle gauge: 24 G Additional Notes Functioning IV was confirmed and monitors were applied. Sterile prep and drape, including hand hygiene, mask and sterile gloves were used. The patient was positioned and the spine was prepped. The skin was anesthetized with lidocaine.  Free flow of clear CSF was obtained prior to injecting local anesthetic into the CSF.  The spinal needle aspirated freely following injection.  The needle was carefully withdrawn.  The patient tolerated the procedure well. Consent was obtained prior to procedure with all questions answered and concerns addressed. Risks including but not limited to bleeding, infection, nerve damage, paralysis, failed block, inadequate analgesia, allergic reaction, high spinal, itching and headache were discussed and the patient wished to proceed.   Hoy Morn, MD

## 2017-08-27 NOTE — Progress Notes (Signed)
AssistedDr. Turk with left, ultrasound guided, adductor canal block. Side rails up, monitors on throughout procedure. See vital signs in flow sheet. Tolerated Procedure well.  

## 2017-08-27 NOTE — Anesthesia Preprocedure Evaluation (Signed)
Anesthesia Evaluation  Patient identified by MRN, date of birth, ID band Patient awake    Reviewed: Allergy & Precautions, NPO status , Patient's Chart, lab work & pertinent test results  Airway Mallampati: II  TM Distance: >3 FB Neck ROM: Full    Dental  (+) Teeth Intact, Dental Advisory Given   Pulmonary sleep apnea and Continuous Positive Airway Pressure Ventilation ,    Pulmonary exam normal breath sounds clear to auscultation       Cardiovascular hypertension, Pt. on home beta blockers and Pt. on medications Normal cardiovascular exam+ dysrhythmias (Sinus tachycardia on Metoprolol)  Rhythm:Regular Rate:Normal     Neuro/Psych PSYCHIATRIC DISORDERS Depression negative neurological ROS     GI/Hepatic Neg liver ROS, PUD, GERD  Medicated,  Endo/Other  Hypothyroidism Obesity   Renal/GU Renal InsufficiencyRenal disease     Musculoskeletal  (+) Arthritis ,   Abdominal   Peds  Hematology negative hematology ROS (+) PLt 178k   Anesthesia Other Findings Day of surgery medications reviewed with the patient.  Reproductive/Obstetrics                             Anesthesia Physical Anesthesia Plan  ASA: II  Anesthesia Plan: Spinal   Post-op Pain Management:  Regional for Post-op pain   Induction:   PONV Risk Score and Plan: 2 and Dexamethasone, Propofol infusion and Ondansetron  Airway Management Planned:   Additional Equipment:   Intra-op Plan:   Post-operative Plan:   Informed Consent: I have reviewed the patients History and Physical, chart, labs and discussed the procedure including the risks, benefits and alternatives for the proposed anesthesia with the patient or authorized representative who has indicated his/her understanding and acceptance.   Dental advisory given  Plan Discussed with: CRNA, Anesthesiologist and Surgeon  Anesthesia Plan Comments: (Discussed risks and  benefits of and differences between spinal and general. Discussed risks of spinal including headache, backache, failure, bleeding, infection, and nerve damage. Patient consents to spinal. Questions answered. Coagulation studies and platelet count acceptable.)        Anesthesia Quick Evaluation

## 2017-08-27 NOTE — Op Note (Signed)
OPERATIVE REPORT-TOTAL KNEE ARTHROPLASTY   Pre-operative diagnosis- Osteoarthritis  Left knee(s)  Post-operative diagnosis- Osteoarthritis Left knee(s)  Procedure-  Left  Total Knee Arthroplasty (Depuy Attune)  Surgeon- Dione Plover. Lisanne Ponce, MD  Assistant- Arlee Muslim, PA-C   Anesthesia-  Adductor canal block and spinal  EBL- 25 ml  Drains Hemovac  Tourniquet time-  Total Tourniquet Time Documented: Thigh (Left) - 31 minutes Total: Thigh (Left) - 31 minutes     Complications- None  Condition-PACU - hemodynamically stable.   Brief Clinical Note  Kristine Olson is a 71 y.o. year old female with end stage OA of her left knee with progressively worsening pain and dysfunction. She has constant pain, with activity and at rest and significant functional deficits with difficulties even with ADLs. She has had extensive non-op management including analgesics, injections of cortisone and viscosupplements, and home exercise program, but remains in significant pain with significant dysfunction. Radiographs show bone on bone arthritis medial and patellofemoral. She presents now for left Total Knee Arthroplasty.    Procedure in detail---   The patient is brought into the operating room and positioned supine on the operating table. After successful administration of  Adductor canal block and spinal,   a tourniquet is placed high on the  Left thigh(s) and the lower extremity is prepped and draped in the usual sterile fashion. Time out is performed by the operating team and then the  Left lower extremity is wrapped in Esmarch, knee flexed and the tourniquet inflated to 300 mmHg.       A midline incision is made with a ten blade through the subcutaneous tissue to the level of the extensor mechanism. A fresh blade is used to make a medial parapatellar arthrotomy. Soft tissue over the proximal medial tibia is subperiosteally elevated to the joint line with a knife and into the semimembranosus bursa  with a Cobb elevator. Soft tissue over the proximal lateral tibia is elevated with attention being paid to avoiding the patellar tendon on the tibial tubercle. The patella is everted, knee flexed 90 degrees and the ACL and PCL are removed. Findings are bone on bone medial and patellofemoral with large medial and patellar osteophytes.        The drill is used to create a starting hole in the distal femur and the canal is thoroughly irrigated with sterile saline to remove the fatty contents. The 5 degree Left  valgus alignment guide is placed into the femoral canal and the distal femoral cutting block is pinned to remove 9 mm off the distal femur. Resection is made with an oscillating saw.      The tibia is subluxed forward and the menisci are removed. The extramedullary alignment guide is placed referencing proximally at the medial aspect of the tibial tubercle and distally along the second metatarsal axis and tibial crest. The block is pinned to remove 55mm off the more deficient medail  side. Resection is made with an oscillating saw. Size 3is the most appropriate size for the tibia and the proximal tibia is prepared with the modular drill and keel punch for that size.      The femoral sizing guide is placed and size 4 is most appropriate. Rotation is marked off the epicondylar axis and confirmed by creating a rectangular flexion gap at 90 degrees. The size 4 cutting block is pinned in this rotation and the anterior, posterior and chamfer cuts are made with the oscillating saw. The intercondylar block is then placed and that  cut is made.      Trial size 3 tibial component, trial size 4 narrow posterior stabilized femur and a 8  mm posterior stabilized rotating platform insert trial is placed. Full extension is achieved with excellent varus/valgus and anterior/posterior balance throughout full range of motion. The patella is everted and thickness measured to be 21  mm. Free hand resection is taken to 12 mm, a 32  template is placed, lug holes are drilled, trial patella is placed, and it tracks normally. Osteophytes are removed off the posterior femur with the trial in place. All trials are removed and the cut bone surfaces prepared with pulsatile lavage. Cement is mixed and once ready for implantation, the size 3 tibial implant, size  4 narrow posterior stabilized femoral component, and the size 32 patella are cemented in place and the patella is held with the clamp. The trial insert is placed and the knee held in full extension. The Exparel (20 ml mixed with 60 ml saline) is injected into the extensor mechanism, posterior capsule, medial and lateral gutters and subcutaneous tissues.  All extruded cement is removed and once the cement is hard the permanent 8 mm posterior stabilized rotating platform insert is placed into the tibial tray.      The wound is copiously irrigated with saline solution and the extensor mechanism closed over a hemovac drain with #1 V-loc suture. The tourniquet is released for a total tourniquet time of 31  minutes. Flexion against gravity is 140 degrees and the patella tracks normally. Subcutaneous tissue is closed with 2.0 vicryl and subcuticular with running 4.0 Monocryl. The incision is cleaned and dried and steri-strips and a bulky sterile dressing are applied. The limb is placed into a knee immobilizer and the patient is awakened and transported to recovery in stable condition.      Please note that a surgical assistant was a medical necessity for this procedure in order to perform it in a safe and expeditious manner. Surgical assistant was necessary to retract the ligaments and vital neurovascular structures to prevent injury to them and also necessary for proper positioning of the limb to allow for anatomic placement of the prosthesis.   Dione Plover Icess Bertoni, MD    08/27/2017, 1:35 PM

## 2017-08-27 NOTE — Anesthesia Procedure Notes (Signed)
Anesthesia Regional Block: Adductor canal block   Pre-Anesthetic Checklist: ,, timeout performed, Correct Patient, Correct Site, Correct Laterality, Correct Procedure, Correct Position, site marked, Risks and benefits discussed,  Surgical consent,  Pre-op evaluation,  At surgeon's request and post-op pain management  Laterality: Left  Prep: chloraprep       Needles:  Injection technique: Single-shot  Needle Type: Echogenic Needle     Needle Length: 9cm  Needle Gauge: 21     Additional Needles:   Procedures:,,,, ultrasound used (permanent image in chart),,,,  Narrative:  Start time: 08/27/2017 12:00 PM End time: 08/27/2017 12:05 PM Injection made incrementally with aspirations every 5 mL.  Performed by: Personally  Anesthesiologist: Catalina Gravel, MD  Additional Notes: No pain on injection. No increased resistance to injection. Injection made in 5cc increments.  Good needle visualization.  Patient tolerated procedure well.

## 2017-08-27 NOTE — Interval H&P Note (Signed)
History and Physical Interval Note:  08/27/2017 10:36 AM  Kristine Olson  has presented today for surgery, with the diagnosis of Osteoarthritis Left knee  The various methods of treatment have been discussed with the patient and family. After consideration of risks, benefits and other options for treatment, the patient has consented to  Procedure(s): LEFT TOTAL KNEE ARTHROPLASTY (Left) as a surgical intervention .  The patient's history has been reviewed, patient examined, no change in status, stable for surgery.  I have reviewed the patient's chart and labs.  Questions were answered to the patient's satisfaction.     Gearlean Alf

## 2017-08-28 LAB — CBC
HCT: 34.7 % — ABNORMAL LOW (ref 36.0–46.0)
Hemoglobin: 11.5 g/dL — ABNORMAL LOW (ref 12.0–15.0)
MCH: 29.1 pg (ref 26.0–34.0)
MCHC: 33.1 g/dL (ref 30.0–36.0)
MCV: 87.8 fL (ref 78.0–100.0)
PLATELETS: 191 10*3/uL (ref 150–400)
RBC: 3.95 MIL/uL (ref 3.87–5.11)
RDW: 13 % (ref 11.5–15.5)
WBC: 12.5 10*3/uL — ABNORMAL HIGH (ref 4.0–10.5)

## 2017-08-28 LAB — BASIC METABOLIC PANEL
Anion gap: 11 (ref 5–15)
BUN: 17 mg/dL (ref 6–20)
CO2: 23 mmol/L (ref 22–32)
CREATININE: 1.08 mg/dL — AB (ref 0.44–1.00)
Calcium: 8.6 mg/dL — ABNORMAL LOW (ref 8.9–10.3)
Chloride: 101 mmol/L (ref 101–111)
GFR calc Af Amer: 59 mL/min — ABNORMAL LOW (ref >60)
GFR, EST NON AFRICAN AMERICAN: 51 mL/min — AB (ref >60)
GLUCOSE: 165 mg/dL — AB (ref 65–99)
POTASSIUM: 4.3 mmol/L (ref 3.5–5.1)
SODIUM: 135 mmol/L (ref 135–145)

## 2017-08-28 MED ORDER — METHOCARBAMOL 500 MG PO TABS: 500.0000 mg | ORAL_TABLET | Freq: Four times a day (QID) | ORAL | 0 refills | Status: DC | PRN

## 2017-08-28 MED ORDER — RIVAROXABAN 10 MG PO TABS: 10.0000 mg | ORAL_TABLET | Freq: Every day | ORAL | 0 refills | Status: DC

## 2017-08-28 MED ORDER — OXYCODONE HCL 5 MG PO TABS: 5.0000 mg | ORAL_TABLET | ORAL | 0 refills | Status: DC | PRN

## 2017-08-28 MED ORDER — ASPIRIN EC 325 MG PO TBEC
325.0000 mg | DELAYED_RELEASE_TABLET | Freq: Every day | ORAL | Status: DC
  Administered 2017-08-28 – 2017-08-29 (×2): 325 mg via ORAL
  Filled 2017-08-28 (×2): qty 1

## 2017-08-28 MED ORDER — ASPIRIN 325 MG PO TBEC: 325.0000 mg | DELAYED_RELEASE_TABLET | Freq: Every day | ORAL | 0 refills | Status: DC

## 2017-08-28 NOTE — Evaluation (Signed)
Physical Therapy Evaluation Patient Details Name: Kristine Olson MRN: [id number] DOB: 28-Feb-1946 Today's Date: 08/28/2017   History of Present Illness  Pt is a 71 year old female s/p L TKA with hx of R unicompartmental knee in 2017  Clinical Impression  Pt is s/p TKA resulting in the deficits listed below (see PT Problem List).  Pt will benefit from skilled PT to increase their independence and safety with mobility to allow discharge to the venue listed below.  Pt ambulated in hallway POD #1 and plans to d/c home possibly tomorrow.     Follow Up Recommendations Home health PT    Equipment Recommendations  None recommended by PT    Recommendations for Other Services       Precautions / Restrictions Precautions Precautions: Knee Required Braces or Orthoses: Knee Immobilizer - Left Restrictions Other Position/Activity Restrictions: WBAT      Mobility  Bed Mobility Overal bed mobility: Needs Assistance Bed Mobility: Supine to Sit     Supine to sit: Min guard;HOB elevated     General bed mobility comments: verbal cues for technique, self assist  Transfers Overall transfer level: Needs assistance Equipment used: Rolling walker (2 wheeled) Transfers: Sit to/from Stand Sit to Stand: Min guard;From elevated surface         General transfer comment: verbal cues for UE and LE positioning  Ambulation/Gait Ambulation/Gait assistance: Min guard Ambulation Distance (Feet): 100 Feet Assistive device: Rolling walker (2 wheeled) Gait Pattern/deviations: Step-to pattern;Decreased stance time - left;Antalgic     General Gait Details: verbal cues for sequence, RW positioning, step length, distance to tolerance  Stairs            Wheelchair Mobility    Modified Rankin (Stroke Patients Only)       Balance                                             Pertinent Vitals/Pain Pain Assessment: 0-10 Pain Score: 3  Pain Location: L knee Pain  Descriptors / Indicators: Aching;Sore Pain Intervention(s): Limited activity within patient's tolerance;Monitored during session;Repositioned;Ice applied    Home Living Family/patient expects to be discharged to:: Private residence Living Arrangements: Spouse/significant other Available Help at Discharge: Family Type of Home: House Home Access: Stairs to enter Entrance Stairs-Rails: None Entrance Stairs-Number of Steps: 1 Home Layout: One level Home Equipment: Environmental consultant - 2 wheels Additional Comments: will be home alone most of the time    Prior Function Level of Independence: Independent               Hand Dominance        Extremity/Trunk Assessment        Lower Extremity Assessment Lower Extremity Assessment: LLE deficits/detail LLE Deficits / Details: unable to perform SLR, ROM TBA, able to perform ankle pumps       Communication   Communication: No difficulties  Cognition Arousal/Alertness: Awake/alert Behavior During Therapy: WFL for tasks assessed/performed Overall Cognitive Status: Within Functional Limits for tasks assessed                                        General Comments      Exercises     Assessment/Plan    PT Assessment Patient needs continued PT services  PT Problem List  Decreased strength;Decreased mobility;Decreased range of motion;Decreased knowledge of use of DME;Decreased knowledge of precautions;Pain       PT Treatment Interventions Functional mobility training;Stair training;Therapeutic exercise;Gait training;Patient/family education;DME instruction;Therapeutic activities    PT Goals (Current goals can be found in the Care Plan section)  Acute Rehab PT Goals PT Goal Formulation: With patient Time For Goal Achievement: 09/01/17 Potential to Achieve Goals: Good    Frequency 7X/week   Barriers to discharge        Co-evaluation               AM-PAC PT "6 Clicks" Daily Activity  Outcome Measure  Difficulty turning over in bed (including adjusting bedclothes, sheets and blankets)?: A Little Difficulty moving from lying on back to sitting on the side of the bed? : A Lot Difficulty sitting down on and standing up from a chair with arms (e.g., wheelchair, bedside commode, etc,.)?: Unable Help needed moving to and from a bed to chair (including a wheelchair)?: A Little Help needed walking in hospital room?: A Little Help needed climbing 3-5 steps with a railing? : A Lot 6 Click Score: 14    End of Session Equipment Utilized During Treatment: Gait belt;Left knee immobilizer Activity Tolerance: Patient tolerated treatment well Patient left: in chair;with call bell/phone within reach   PT Visit Diagnosis: Difficulty in walking, not elsewhere classified (R26.2);Pain Pain - Right/Left: Left Pain - part of body: Knee    Time: 5427-0623 PT Time Calculation (min) (ACUTE ONLY): 16 min   Charges:   PT Evaluation $PT Eval Low Complexity: 1 Low     PT G Codes:        [name], PT, DPT 08/28/2017 Pager: [phone]  York Ram E 08/28/2017, 11:21 AM

## 2017-08-28 NOTE — Progress Notes (Signed)
Subjective: 1 Day Post-Op Procedure(s) (LRB): LEFT TOTAL KNEE ARTHROPLASTY (Left) Patient reports pain as mild.   Patient seen in rounds for Dr. Wynelle Link. Husband in room at bedside. Patient is well, but has had some minor complaints of pain in the knee, requiring pain medications We will start therapy today.  If they do well with therapy and meets all goals, then will allow home later this afternoon following therapy. Plan is to go Home after hospital stay.  Objective: Vital signs in last 24 hours: Temp:  [97.5 F (36.4 C)-99.4 F (37.4 C)] 98.5 F (36.9 C) (11/06 0520) Pulse Rate:  [60-81] 68 (11/06 0627) Resp:  [10-18] 18 (11/06 0627) BP: (120-153)/(69-97) 127/90 (11/06 0520) SpO2:  [94 %-100 %] 98 % (11/06 0627) Weight:  [94.8 kg (209 lb)] 94.8 kg (209 lb) (11/05 1120)  Intake/Output from previous day:  Intake/Output Summary (Last 24 hours) at 08/28/2017 0817 Last data filed at 08/28/2017 6222 Gross per 24 hour  Intake 4027.5 ml  Output 1625 ml  Net 2402.5 ml    Intake/Output this shift: No intake/output data recorded.  Labs: Recent Labs    08/28/17 0557  HGB 11.5*   Recent Labs    08/28/17 0557  WBC 12.5*  RBC 3.95  HCT 34.7*  PLT 191   Recent Labs    08/28/17 0557  NA 135  K 4.3  CL 101  CO2 23  BUN 17  CREATININE 1.08*  GLUCOSE 165*  CALCIUM 8.6*   No results for input(s): LABPT, INR in the last 72 hours.  EXAM General - Patient is Alert, Appropriate and Oriented Extremity - Neurovascular intact Sensation intact distally Intact pulses distally Dorsiflexion/Plantar flexion intact Dressing - dressing C/D/I Motor Function - intact, moving foot and toes well on exam.  Hemovac pulled without difficulty.  Past Medical History:  Diagnosis Date  . Acute pyloric channel ulcer with hemorrhage on Xarelto   . Anemia    history of anemia after bleeding ulcer  . Arthritis    ostearthritis - right  medial knee  . Complication of anesthesia    was given a medication while waiting to go to surgery that caused burning sensation on scalp was given another medication and the burning went away  . Depression   . GERD (gastroesophageal reflux disease)    "heartburn daily"-TUMS helpful  . History of blood transfusion    2 units PRBC  . Hx of sinus tachycardia    rarely any problems since metoprolol use  . Hyperlipidemia    patient unaware, has never taking medication  . Hypertension   . Hypothyroidism   . Sleep apnea    uses c pap  . Varicose vein of leg     Assessment/Plan: 1 Day Post-Op Procedure(s) (LRB): LEFT TOTAL KNEE ARTHROPLASTY (Left) Active Problems:   OA (osteoarthritis) of knee  Estimated body mass index is 37.02 kg/m as calculated from the following:   Height as of this encounter: 5\' 3"  (1.6 m).   Weight as of this encounter: 94.8 kg (209 lb). Up with therapy Discharge home with home health  DVT Prophylaxis - Xarelto Weight-Bearing as tolerated to left leg D/C O2 and Pulse OX and try on Room Air  If meets goals and able to go home: Discharge home with home health Diet - Cardiac diet Follow up - in 2 weeks Activity - WBAT Disposition - Home Condition Upon Discharge - pending therapy D/C Meds - See DC Summary DVT Prophylaxis - Xarelto  Dian Situ  Dara Lords, PA-C Orthopaedic Surgery

## 2017-08-28 NOTE — Progress Notes (Signed)
Physical Therapy Treatment Patient Details Name: Kristine Olson MRN: [id number] DOB: 1946/04/25 Today's Date: 08/28/2017    History of Present Illness Pt is a 71 year old female s/p L TKA with hx of R unicompartmental knee in 2017    PT Comments    Pt performed LE exercises in recliner and then ambulated in hallway short distance.  Pt plans to d/c home tomorrow.  Follow Up Recommendations  Home health PT     Equipment Recommendations  None recommended by PT    Recommendations for Other Services       Precautions / Restrictions Precautions Precautions: Knee Required Braces or Orthoses: Knee Immobilizer - Left Restrictions Other Position/Activity Restrictions: WBAT    Mobility  Bed Mobility Overal bed mobility: Needs Assistance Bed Mobility: Sit to Supine       Sit to supine: Min assist   General bed mobility comments: verbal cues for technique, assist for L LE  Transfers Overall transfer level: Needs assistance Equipment used: Rolling walker (2 wheeled) Transfers: Sit to/from Stand Sit to Stand: Min guard         General transfer comment: verbal cues for UE and LE positioning  Ambulation/Gait Ambulation/Gait assistance: Min guard Ambulation Distance (Feet): 60 Feet Assistive device: Rolling walker (2 wheeled) Gait Pattern/deviations: Step-to pattern;Decreased stance time - left;Antalgic     General Gait Details: verbal cues for sequence, RW positioning, step length, distance to tolerance   Stairs            Wheelchair Mobility    Modified Rankin (Stroke Patients Only)       Balance                                            Cognition Arousal/Alertness: Awake/alert Behavior During Therapy: WFL for tasks assessed/performed Overall Cognitive Status: Within Functional Limits for tasks assessed                                        Exercises Total Joint Exercises Ankle Circles/Pumps: AROM;10  reps;Both Quad Sets: AROM;10 reps;Left Short Arc Quad: AAROM;Left;10 reps Heel Slides: AAROM;Left;10 reps Hip ABduction/ADduction: AROM;Left;10 reps Straight Leg Raises: AAROM;Left;10 reps Goniometric ROM: L knee AAROM flexion 40*    General Comments        Pertinent Vitals/Pain Pain Assessment: 0-10 Pain Score: 4  Pain Location: L knee Pain Descriptors / Indicators: Aching;Sore Pain Intervention(s): Limited activity within patient's tolerance;Repositioned;Monitored during session;Premedicated before session    Home Living                      Prior Function            PT Goals (current goals can now be found in the care plan section) Progress towards PT goals: Progressing toward goals    Frequency    7X/week      PT Plan Current plan remains appropriate    Co-evaluation              AM-PAC PT "6 Clicks" Daily Activity  Outcome Measure  Difficulty turning over in bed (including adjusting bedclothes, sheets and blankets)?: A Little Difficulty moving from lying on back to sitting on the side of the bed? : Unable Difficulty sitting down on and standing up from a chair  with arms (e.g., wheelchair, bedside commode, etc,.)?: Unable Help needed moving to and from a bed to chair (including a wheelchair)?: A Little Help needed walking in hospital room?: A Little Help needed climbing 3-5 steps with a railing? : A Lot 6 Click Score: 13    End of Session Equipment Utilized During Treatment: Left knee immobilizer Activity Tolerance: Patient tolerated treatment well Patient left: with call bell/phone within reach;in bed   PT Visit Diagnosis: Difficulty in walking, not elsewhere classified (R26.2);Pain Pain - Right/Left: Left Pain - part of body: Knee     Time: 1410-1431 PT Time Calculation (min) (ACUTE ONLY): 21 min  Charges:  $Therapeutic Exercise: 8-22 mins                    G Codes:       [name], PT, DPT 08/28/2017 Pager:  [phone]  York Ram E 08/28/2017, 3:23 PM

## 2017-08-28 NOTE — Discharge Summary (Signed)
Physician Discharge Summary   Patient ID: Kristine Olson MRN: [id number] DOB/AGE: 1946-03-18 71 y.o.  Admit date: 08/27/2017 Discharge date: 08-29-2017  Primary Diagnosis:  Osteoarthritis  Left knee(s)   Admission Diagnoses:  Past Medical History:  Diagnosis Date  . Acute pyloric channel ulcer with hemorrhage on Xarelto   . Anemia    history of anemia after bleeding ulcer  . Arthritis    ostearthritis - right  medial knee  . Complication of anesthesia    was given a medication while waiting to go to surgery that caused burning sensation on scalp was given another medication and the burning went away  . Depression   . GERD (gastroesophageal reflux disease)    "heartburn daily"-TUMS helpful  . History of blood transfusion    2 units PRBC  . Hx of sinus tachycardia    rarely any problems since metoprolol use  . Hyperlipidemia    patient unaware, has never taking medication  . Hypertension   . Hypothyroidism   . Sleep apnea    uses c pap  . Varicose vein of leg    Discharge Diagnoses:   Active Problems:   OA (osteoarthritis) of knee  Estimated body mass index is 37.02 kg/m as calculated from the following:   Height as of this encounter: _0  (1.6 m).   Weight as of this encounter: 94.8 kg (209 lb).  Procedure:  Procedure(s) (LRB): LEFT TOTAL KNEE ARTHROPLASTY (Left)   Consults: None  HPI: Kristine Olson is a 71 y.o. year old female with end stage OA of her left knee with progressively worsening pain and dysfunction. She has constant pain, with activity and at rest and significant functional deficits with difficulties even with ADLs. She has had extensive non-op management including analgesics, injections of cortisone and viscosupplements, and home exercise program, but remains in significant pain with significant dysfunction. Radiographs show bone on bone arthritis medial and patellofemoral. She presents now for left Total Knee Arthroplasty.     Laboratory  Data: Admission on 08/27/2017  Component Date Value Ref Range Status  . WBC 08/28/2017 12.5* 4.0 - 10.5 K/uL Final  . RBC 08/28/2017 3.95  3.87 - 5.11 MIL/uL Final  . Hemoglobin 08/28/2017 11.5* 12.0 - 15.0 g/dL Final  . HCT 08/28/2017 34.7* 36.0 - 46.0 % Final  . MCV 08/28/2017 87.8  78.0 - 100.0 fL Final  . MCH 08/28/2017 29.1  26.0 - 34.0 pg Final  . MCHC 08/28/2017 33.1  30.0 - 36.0 g/dL Final  . RDW 08/28/2017 13.0  11.5 - 15.5 % Final  . Platelets 08/28/2017 191  150 - 400 K/uL Final  . Sodium 08/28/2017 135  135 - 145 mmol/L Final  . Potassium 08/28/2017 4.3  3.5 - 5.1 mmol/L Final  . Chloride 08/28/2017 101  101 - 111 mmol/L Final  . CO2 08/28/2017 23  22 - 32 mmol/L Final  . Glucose, Bld 08/28/2017 165* 65 - 99 mg/dL Final  . BUN 08/28/2017 17  6 - 20 mg/dL Final  . Creatinine, Ser 08/28/2017 1.08* 0.44 - 1.00 mg/dL Final  . Calcium 08/28/2017 8.6* 8.9 - 10.3 mg/dL Final  . GFR calc non Af Amer 08/28/2017 51* >60 mL/min Final  . GFR calc Af Amer 08/28/2017 59* >60 mL/min Final   Comment: (NOTE) The eGFR has been calculated using the CKD EPI equation. This calculation has not been validated in all clinical situations. eGFR's persistently <60 mL/min signify possible Chronic Kidney Disease.   . Anion gap  08/28/2017 11  5 - 15 Final  Hospital Outpatient Visit on 08/21/2017  Component Date Value Ref Range Status  . aPTT 08/21/2017 32  24 - 36 seconds Final  . WBC 08/21/2017 5.3  4.0 - 10.5 K/uL Final  . RBC 08/21/2017 4.37  3.87 - 5.11 MIL/uL Final  . Hemoglobin 08/21/2017 12.9  12.0 - 15.0 g/dL Final  . HCT 08/21/2017 38.7  36.0 - 46.0 % Final  . MCV 08/21/2017 88.6  78.0 - 100.0 fL Final  . MCH 08/21/2017 29.5  26.0 - 34.0 pg Final  . MCHC 08/21/2017 33.3  30.0 - 36.0 g/dL Final  . RDW 08/21/2017 13.3  11.5 - 15.5 % Final  . Platelets 08/21/2017 178  150 - 400 K/uL Final  . Sodium 08/21/2017 139  135 - 145 mmol/L Final  . Potassium 08/21/2017 4.5  3.5 - 5.1 mmol/L  Final  . Chloride 08/21/2017 104  101 - 111 mmol/L Final  . CO2 08/21/2017 26  22 - 32 mmol/L Final  . Glucose, Bld 08/21/2017 91  65 - 99 mg/dL Final  . BUN 08/21/2017 17  6 - 20 mg/dL Final  . Creatinine, Ser 08/21/2017 1.16* 0.44 - 1.00 mg/dL Final  . Calcium 08/21/2017 9.4  8.9 - 10.3 mg/dL Final  . Total Protein 08/21/2017 7.5  6.5 - 8.1 g/dL Final  . Albumin 08/21/2017 3.9  3.5 - 5.0 g/dL Final  . AST 08/21/2017 24  15 - 41 U/L Final  . ALT 08/21/2017 17  14 - 54 U/L Final  . Alkaline Phosphatase 08/21/2017 68  38 - 126 U/L Final  . Total Bilirubin 08/21/2017 0.7  0.3 - 1.2 mg/dL Final  . GFR calc non Af Amer 08/21/2017 47* >60 mL/min Final  . GFR calc Af Amer 08/21/2017 54* >60 mL/min Final   Comment: (NOTE) The eGFR has been calculated using the CKD EPI equation. This calculation has not been validated in all clinical situations. eGFR's persistently <60 mL/min signify possible Chronic Kidney Disease.   . Anion gap 08/21/2017 9  5 - 15 Final  . Prothrombin Time 08/21/2017 13.0  11.4 - 15.2 seconds Final  . INR 08/21/2017 0.99   Final  . ABO/RH(D) 08/21/2017 A POS   Final  . Antibody Screen 08/21/2017 NEG   Final  . Sample Expiration 08/21/2017 08/30/2017   Final  . Extend sample reason 08/21/2017 NO TRANSFUSIONS OR PREGNANCY IN THE PAST 3 MONTHS   Final  . MRSA, PCR 08/21/2017 NEGATIVE  NEGATIVE Final  . Staphylococcus aureus 08/21/2017 NEGATIVE  NEGATIVE Final   Comment: (NOTE) The Xpert SA Assay (FDA approved for NASAL specimens in patients 34 years of age and older), is one component of a comprehensive surveillance program. It is not intended to diagnose infection nor to guide or monitor treatment.      X-Rays:No results found.  EKG: Orders placed or performed in visit on 07/24/17  . EKG 12-Lead     Hospital Course: Kristine Olson is a 71 y.o. who was admitted to [facility]. They were brought to the operating room on 08/27/2017 and underwent  Procedure(s): LEFT TOTAL KNEE ARTHROPLASTY.  Patient tolerated the procedure well and was later transferred to the recovery room and then to the orthopaedic floor for postoperative care.  They were given PO and IV analgesics for pain control following their surgery.  They were given 24 hours of postoperative antibiotics of  Anti-infectives (From admission, onward)   Start  Dose/Rate Route Frequency Ordered Stop   08/27/17 2359  vancomycin (VANCOCIN) IVPB 1000 mg/200 mL premix     1,000 mg 200 mL/hr over 60 Minutes Intravenous Every 12 hours 08/27/17 1555 08/28/17 0108   08/27/17 1040  vancomycin (VANCOCIN) IVPB 1000 mg/200 mL premix     1,000 mg 200 mL/hr over 60 Minutes Intravenous On call to O.R. 08/27/17 1040 08/27/17 1308     and started on DVT prophylaxis in the form of Xarelto.   PT and OT were ordered for total joint protocol.  Discharge planning consulted to help with postop disposition and equipment needs.  Patient had a tough night on the evening of surgery.  They started to get up OOB with therapy on day one. Hemovac drain was pulled without difficulty.  Continued to work with therapy into day two.  Dressing was changed on day two and the incision was healing well.  Patient was seen in rounds on POD 2 and was ready to go home.   Diet- Cardiac diet Follow up- in2weeks Activity- WBAT Disposition- Home Condition Upon Discharge- improving D/C Meds- See DC Summary DVT Prophylaxis- Xarelto     Discharge Instructions    Call MD / Call 911   Complete by:  As directed    If you experience chest pain or shortness of breath, CALL 911 and be transported to the hospital emergency room.  If you develope a fever above 101 F, pus (white drainage) or increased drainage or redness at the wound, or calf pain, call your surgeon's office.   Change dressing   Complete by:  As directed    Change dressing daily with sterile 4 x 4 inch gauze dressing and apply TED hose. Do not submerge  the incision under water.   Constipation Prevention   Complete by:  As directed    Drink plenty of fluids.  Prune juice may be helpful.  You may use a stool softener, such as Colace (over the counter) 100 mg twice a day.  Use MiraLax (over the counter) for constipation as needed.   Diet general   Complete by:  As directed    Discharge instructions   Complete by:  As directed    Take Xarelto for two and a half more weeks, then discontinue Xarelto. Once the patient has completed the blood thinner regimen, then take a Baby 81 mg Aspirin daily for three more weeks.   Pick up stool softner and laxative for home use following surgery while on pain medications. Do not submerge incision under water. Please use good hand washing techniques while changing dressing each day. May shower starting three days after surgery. Please use a clean towel to pat the incision dry following showers. Continue to use ice for pain and swelling after surgery. Do not use any lotions or creams on the incision until instructed by your surgeon.  Wear both TED hose on both legs during the day every day for three weeks, but may remove the TED hose at night at home.  Postoperative Constipation Protocol  Constipation - defined medically as fewer than three stools per week and severe constipation as less than one stool per week.  One of the most common issues patients have following surgery is constipation.  Even if you have a regular bowel pattern at home, your normal regimen is likely to be disrupted due to multiple reasons following surgery.  Combination of anesthesia, postoperative narcotics, change in appetite and fluid intake all can affect your bowels.  In  order to avoid complications following surgery, here are some recommendations in order to help you during your recovery period.  Colace (docusate) - Pick up an over-the-counter form of Colace or another stool softener and take twice a day as long as you are  requiring postoperative pain medications.  Take with a full glass of water daily.  If you experience loose stools or diarrhea, hold the colace until you stool forms back up.  If your symptoms do not get better within 1 week or if they get worse, check with your doctor.  Dulcolax (bisacodyl) - Pick up over-the-counter and take as directed by the product packaging as needed to assist with the movement of your bowels.  Take with a full glass of water.  Use this product as needed if not relieved by Colace only.   MiraLax (polyethylene glycol) - Pick up over-the-counter to have on hand.  MiraLax is a solution that will increase the amount of water in your bowels to assist with bowel movements.  Take as directed and can mix with a glass of water, juice, soda, coffee, or tea.  Take if you go more than two days without a movement. Do not use MiraLax more than once per day. Call your doctor if you are still constipated or irregular after using this medication for 7 days in a row.  If you continue to have problems with postoperative constipation, please contact the office for further assistance and recommendations.  If you experience "the worst abdominal pain ever" or develop nausea or vomiting, please contact the office immediatly for further recommendations for treatment.   Do not put a pillow under the knee. Place it under the heel.   Complete by:  As directed    Do not sit on low chairs, stoools or toilet seats, as it may be difficult to get up from low surfaces   Complete by:  As directed    Driving restrictions   Complete by:  As directed    No driving until released by the physician.   Increase activity slowly as tolerated   Complete by:  As directed    Lifting restrictions   Complete by:  As directed    No lifting until released by the physician.   Patient may shower   Complete by:  As directed    You may shower without a dressing once there is no drainage.  Do not wash over the wound.  If  drainage remains, do not shower until drainage stops.   TED hose   Complete by:  As directed    Use stockings (TED hose) for 3 weeks on both leg(s).  You may remove them at night for sleeping.   Weight bearing as tolerated   Complete by:  As directed    Laterality:  left   Extremity:  Lower     Allergies as of 08/28/2017      Reactions   Cefaclor Rash   Because of a history of documented adverse serious drug reaction;Medi Alert bracelet  is recommended   Penicillins    REACTION: rash Because of a history of documented adverse serious drug reaction;Medi Alert bracelet  is recommended Has patient had a PCN reaction causing immediate rash, facial/tongue/throat swelling, SOB or lightheadedness with hypotension: no Has patient had a PCN reaction causing severe rash involving mucus membranes or skin necrosis: {no Has patient had a PCN reaction that required hospitalization no Has patient had a PCN reaction occurring within the last 10 years: no  If all of the above ans   Sulfa Antibiotics Rash   01/22/13 after 9 days of Sulfa Because of a history of documented adverse serious drug reaction;Medi Alert bracelet  is recommended   Advair Diskus [fluticasone-salmeterol]    "Felt weird" and flushing.      Medication List    STOP taking these medications   calcium carbonate 500 MG chewable tablet Commonly known as:  TUMS - dosed in mg elemental calcium   Vitamin D 2000 units Caps     TAKE these medications   aspirin 325 MG EC tablet Take 1 tablet (325 mg total) daily by mouth. Take a full dose enteric coated 325 mg Aspirin daily for three weeks, then discontinue a take a baby 81 mg Aspirin daily for three additional weeks.   azelastine 0.1 % nasal spray Commonly known as:  ASTELIN Place 2 sprays into both nostrils 2 (two) times daily. Use in each nostril as directed   cetirizine 10 MG tablet Commonly known as:  ZYRTEC Take 10 mg by mouth daily as needed for allergies.   citalopram 20  MG tablet Commonly known as:  CELEXA Take 20 mg by mouth at bedtime.   levothyroxine 112 MCG tablet Commonly known as:  SYNTHROID, LEVOTHROID TAKE 1 TABLET DAILY EXCEPT FOR 1 AND 1/2 TABLETS ON WEDNESDAY   methocarbamol 500 MG tablet Commonly known as:  ROBAXIN Take 1 tablet (500 mg total) every 6 (six) hours as needed by mouth for muscle spasms.   metoprolol tartrate 25 MG tablet Commonly known as:  LOPRESSOR TAKE 1/2 TABLET BY MOUTH TWICE A DAY What changed:    how much to take  how to take this  when to take this   oxyCODONE 5 MG immediate release tablet Commonly known as:  Oxy IR/ROXICODONE Take 1-2 tablets (5-10 mg total) every 4 (four) hours as needed by mouth for moderate pain ((score 4 to 6)).   pantoprazole 20 MG tablet Commonly known as:  PROTONIX Take 1 tablet (20 mg total) by mouth daily before breakfast. What changed:    how much to take  when to take this  additional instructions   spironolactone 25 MG tablet Commonly known as:  ALDACTONE TAKE 1 TABLET (25 MG TOTAL) BY MOUTH DAILY. What changed:  when to take this            Discharge Care Instructions  (From admission, onward)        Start     Ordered   08/28/17 0000  Weight bearing as tolerated    Question Answer Comment  Laterality left   Extremity Lower      08/28/17 0825   08/28/17 0000  Change dressing    Comments:  Change dressing daily with sterile 4 x 4 inch gauze dressing and apply TED hose. Do not submerge the incision under water.   08/28/17 0825     Follow-up Information    Gaynelle Arabian, MD. Schedule an appointment as soon as possible for a visit on 09/11/2017.   Specialty:  Orthopedic Surgery Contact information: [address] [place] [address] [phone]           Signed: Arlee Muslim, PA-C Orthopaedic Surgery 08/28/2017, 8:45 AM

## 2017-08-28 NOTE — Progress Notes (Signed)
OT Cancellation Note  Patient Details Name: Kristine Olson MRN: [id number] DOB: 06-May-1946   Cancelled Treatment:    Reason Eval/Treat Not Completed: OT screened, no needs identified, will sign off. Pt has all necessary DME and A/E from pervious R knee surgery and will have assist from family at home  Britt Bottom 08/28/2017, 12:32 PM

## 2017-08-28 NOTE — Progress Notes (Signed)
Discharge planning, spoke with patient at bedside. Have chosen [facility] for HH PT. Contacted [facility] for referral. Has RW and 3n1. [phone] 

## 2017-08-29 LAB — CBC
HCT: 37.3 % (ref 36.0–46.0)
Hemoglobin: 12.3 g/dL (ref 12.0–15.0)
MCH: 29.1 pg (ref 26.0–34.0)
MCHC: 33 g/dL (ref 30.0–36.0)
MCV: 88.4 fL (ref 78.0–100.0)
PLATELETS: 241 10*3/uL (ref 150–400)
RBC: 4.22 MIL/uL (ref 3.87–5.11)
RDW: 13.2 % (ref 11.5–15.5)
WBC: 13 10*3/uL — AB (ref 4.0–10.5)

## 2017-08-29 LAB — BASIC METABOLIC PANEL
ANION GAP: 9 (ref 5–15)
BUN: 19 mg/dL (ref 6–20)
CALCIUM: 8.7 mg/dL — AB (ref 8.9–10.3)
CO2: 27 mmol/L (ref 22–32)
Chloride: 100 mmol/L — ABNORMAL LOW (ref 101–111)
Creatinine, Ser: 1.24 mg/dL — ABNORMAL HIGH (ref 0.44–1.00)
GFR calc Af Amer: 50 mL/min — ABNORMAL LOW (ref >60)
GFR, EST NON AFRICAN AMERICAN: 43 mL/min — AB (ref >60)
GLUCOSE: 125 mg/dL — AB (ref 65–99)
Potassium: 3.8 mmol/L (ref 3.5–5.1)
Sodium: 136 mmol/L (ref 135–145)

## 2017-08-29 MED ORDER — OXYCODONE HCL 5 MG PO TABS: 5.0000 mg | ORAL_TABLET | ORAL | 0 refills | Status: DC | PRN

## 2017-08-29 MED ORDER — ASPIRIN 325 MG PO TBEC: 325.0000 mg | DELAYED_RELEASE_TABLET | Freq: Every day | ORAL | 0 refills | Status: DC

## 2017-08-29 MED ORDER — METHOCARBAMOL 500 MG PO TABS: 500.0000 mg | ORAL_TABLET | Freq: Four times a day (QID) | ORAL | 0 refills | Status: DC | PRN

## 2017-08-29 NOTE — Progress Notes (Signed)
   Subjective: 2 Days Post-Op Procedure(s) (LRB): LEFT TOTAL KNEE ARTHROPLASTY (Left) Patient reports pain as mild.   Patient seen in rounds for Dr. Wynelle Link. Patient is well, but has had some minor complaints of pain in the knee, requiring pain medications Patient is ready to go home after two sessions.  Objective: Vital signs in last 24 hours: Temp:  [97.9 F (36.6 C)-98.5 F (36.9 C)] 98.4 F (36.9 C) (11/07 0534) Pulse Rate:  [70-88] 88 (11/07 0534) Resp:  [14-16] 14 (11/07 0534) BP: (127-145)/(77-85) 142/84 (11/07 0534) SpO2:  [94 %-98 %] 94 % (11/07 0534)  Intake/Output from previous day:  Intake/Output Summary (Last 24 hours) at 08/29/2017 0812 Last data filed at 08/29/2017 0535 Gross per 24 hour  Intake 1080 ml  Output 2100 ml  Net -1020 ml    Intake/Output this shift: No intake/output data recorded.  Labs: Recent Labs    08/28/17 0557 08/29/17 0554  HGB 11.5* 12.3   Recent Labs    08/28/17 0557 08/29/17 0554  WBC 12.5* 13.0*  RBC 3.95 4.22  HCT 34.7* 37.3  PLT 191 241   Recent Labs    08/28/17 0557 08/29/17 0554  NA 135 136  K 4.3 3.8  CL 101 100*  CO2 23 27  BUN 17 19  CREATININE 1.08* 1.24*  GLUCOSE 165* 125*  CALCIUM 8.6* 8.7*   No results for input(s): LABPT, INR in the last 72 hours.  EXAM: General - Patient is Alert, Appropriate and Oriented Extremity - Neurovascular intact Sensation intact distally Incision - clean, dry, no drainage Motor Function - intact, moving foot and toes well on exam.   Assessment/Plan: 2 Days Post-Op Procedure(s) (LRB): LEFT TOTAL KNEE ARTHROPLASTY (Left) Procedure(s) (LRB): LEFT TOTAL KNEE ARTHROPLASTY (Left) Past Medical History:  Diagnosis Date  . Acute pyloric channel ulcer with hemorrhage on Xarelto   . Anemia    history of anemia after bleeding ulcer  . Arthritis    ostearthritis - right  medial knee  . Complication of anesthesia    was given a medication while waiting to go to surgery that  caused burning sensation on scalp was given another medication and the burning went away  . Depression   . GERD (gastroesophageal reflux disease)    "heartburn daily"-TUMS helpful  . History of blood transfusion    2 units PRBC  . Hx of sinus tachycardia    rarely any problems since metoprolol use  . Hyperlipidemia    patient unaware, has never taking medication  . Hypertension   . Hypothyroidism   . Sleep apnea    uses c pap  . Varicose vein of leg    Active Problems:   OA (osteoarthritis) of knee  Estimated body mass index is 37.02 kg/m as calculated from the following:   Height as of this encounter: 5\' 3"  (1.6 m).   Weight as of this encounter: 94.8 kg (209 lb).  Discharge home with home health Diet - Cardiac diet Follow up - in 2 weeks Activity - WBAT Disposition - Home Condition Upon Discharge - improving D/C Meds - See DC Summary DVT Prophylaxis - Xarelto   Arlee Muslim, PA-C Orthopaedic Surgery 08/29/2017, 8:12 AM

## 2017-08-29 NOTE — Plan of Care (Signed)
Discharge instructions given to pateint and family  d Mariame Rybolt RN

## 2017-08-29 NOTE — Progress Notes (Signed)
Physical Therapy Treatment Patient Details Name: Kristine Olson MRN: [id number] DOB: [birth year] Today's Date: 08/29/2017    History of Present Illness Pt is a 71 year old female s/p L TKA with hx of R unicompartmental knee in 2017    PT Comments    Pt assisted with changing gown and donning undergarments in bathroom (found in bathroom on toilet awaiting assist).  Pt then ambulated in hallway and practiced one step.  Pt reports possible d/c later today after second session.   Follow Up Recommendations  Home health PT     Equipment Recommendations  None recommended by PT    Recommendations for Other Services       Precautions / Restrictions Precautions Precautions: Knee Required Braces or Orthoses: Knee Immobilizer - Left Restrictions Other Position/Activity Restrictions: WBAT    Mobility  Bed Mobility               General bed mobility comments: pt in bathroom on toilet upon arrival, spouse in room  Transfers Overall transfer level: Needs assistance Equipment used: Rolling walker (2 wheeled) Transfers: Sit to/from Stand Sit to Stand: Min guard         General transfer comment: verbal cues for UE and LE positioning  Ambulation/Gait Ambulation/Gait assistance: Min guard Ambulation Distance (Feet): 80 Feet Assistive device: Rolling walker (2 wheeled) Gait Pattern/deviations: Step-to pattern;Decreased stance time - left;Antalgic     General Gait Details: verbal cues for RW positioning, step length, distance to tolerance   Stairs Stairs: Yes   Stair Management: Step to pattern;Forwards;With walker Number of Stairs: 1 General stair comments: verbal cues for safety and sequence  Wheelchair Mobility    Modified Rankin (Stroke Patients Only)       Balance                                            Cognition Arousal/Alertness: Awake/alert Behavior During Therapy: WFL for tasks assessed/performed Overall Cognitive Status:  Within Functional Limits for tasks assessed                                        Exercises      General Comments        Pertinent Vitals/Pain Pain Assessment: 0-10 Pain Score: 5  Pain Location: L knee Pain Descriptors / Indicators: Aching;Sore Pain Intervention(s): Limited activity within patient's tolerance;Repositioned;Monitored during session    Home Living                      Prior Function            PT Goals (current goals can now be found in the care plan section) Progress towards PT goals: Progressing toward goals    Frequency    7X/week      PT Plan Current plan remains appropriate    Co-evaluation              AM-PAC PT "6 Clicks" Daily Activity  Outcome Measure  Difficulty turning over in bed (including adjusting bedclothes, sheets and blankets)?: A Little Difficulty moving from lying on back to sitting on the side of the bed? : A Little Difficulty sitting down on and standing up from a chair with arms (e.g., wheelchair, bedside commode, etc,.)?: A Little Help needed moving  to and from a bed to chair (including a wheelchair)?: A Little Help needed walking in hospital room?: A Little Help needed climbing 3-5 steps with a railing? : A Little 6 Click Score: 18    End of Session Equipment Utilized During Treatment: Left knee immobilizer Activity Tolerance: Patient tolerated treatment well Patient left: with call bell/phone within reach;in chair   PT Visit Diagnosis: Difficulty in walking, not elsewhere classified (R26.2);Pain Pain - Right/Left: Left Pain - part of body: Knee     Time: 4163-8453 PT Time Calculation (min) (ACUTE ONLY): 19 min  Charges:  $Gait Training: 8-22 mins                    G Codes:       [name], PT, DPT 08/29/2017 Pager: [phone]  York Ram E 08/29/2017, 12:35 PM

## 2017-08-29 NOTE — Progress Notes (Signed)
Physical Therapy Treatment Patient Details Name: Kristine Olson MRN: [id number] DOB: 1946-05-15 Today's Date: 08/29/2017    History of Present Illness Pt is a 71 year old female s/p L TKA with hx of R unicompartmental knee in 2017    PT Comments    Pt reports frustration earlier with attempts to get out of recliner (timely response from staff) so she let her recliner down and feels she may have hurt her L LE.  (Pt in bed upon arriving in room.) She was uncertain if KI was on during this time (PT left patient in recliner with KI in place and secure after morning session).  Pt agreeable to check ROM and ambulate.  Pt does not report significant discomfort with flexion and extension exercises.  Pt reporting increased desire to d/c home today.  Pt ambulated short distance in hallway and reports no further significant discomfort (in comparison to previous ambulation).  Provided HEP handout and pt feels ready to d/c home.   Follow Up Recommendations  Home health PT     Equipment Recommendations  None recommended by PT    Recommendations for Other Services       Precautions / Restrictions Precautions Precautions: Knee Required Braces or Orthoses: Knee Immobilizer - Left Restrictions Other Position/Activity Restrictions: WBAT    Mobility  Bed Mobility Overal bed mobility: Needs Assistance Bed Mobility: Supine to Sit     Supine to sit: Min assist     General bed mobility comments: assist for L LE due to pain, spouse aware he may need to assist upon d/c home  Transfers Overall transfer level: Needs assistance Equipment used: Rolling walker (2 wheeled) Transfers: Sit to/from Stand Sit to Stand: Min guard         General transfer comment: verbal cues for UE and LE positioning  Ambulation/Gait Ambulation/Gait assistance: Min guard Ambulation Distance (Feet): 40 Feet Assistive device: Rolling walker (2 wheeled) Gait Pattern/deviations: Step-to pattern;Decreased stance  time - left;Antalgic     General Gait Details: verbal cues for RW positioning, step length, distance limited due to saving energy for d/c home   Stairs Stairs: Yes   Stair Management: Step to pattern;Forwards;With walker Number of Stairs: 1 General stair comments: verbal cues for safety and sequence  Wheelchair Mobility    Modified Rankin (Stroke Patients Only)       Balance                                            Cognition Arousal/Alertness: Awake/alert Behavior During Therapy: WFL for tasks assessed/performed Overall Cognitive Status: Within Functional Limits for tasks assessed                                        Exercises Total Joint Exercises Quad Sets: AROM;10 reps;Left;Supine Heel Slides: AAROM;Left;10 reps;Supine    General Comments        Pertinent Vitals/Pain Pain Assessment: 0-10 Pain Score: 5  Pain Location: L knee Pain Descriptors / Indicators: Aching;Sore Pain Intervention(s): Limited activity within patient's tolerance;Repositioned;Monitored during session;Premedicated before session    Home Living                      Prior Function            PT Goals (  current goals can now be found in the care plan section) Progress towards PT goals: Progressing toward goals    Frequency    7X/week      PT Plan Current plan remains appropriate    Co-evaluation              AM-PAC PT "6 Clicks" Daily Activity  Outcome Measure  Difficulty turning over in bed (including adjusting bedclothes, sheets and blankets)?: A Little Difficulty moving from lying on back to sitting on the side of the bed? : A Little Difficulty sitting down on and standing up from a chair with arms (e.g., wheelchair, bedside commode, etc,.)?: A Little Help needed moving to and from a bed to chair (including a wheelchair)?: A Little Help needed walking in hospital room?: A Little Help needed climbing 3-5 steps with a  railing? : A Little 6 Click Score: 18    End of Session Equipment Utilized During Treatment: Left knee immobilizer Activity Tolerance: Patient tolerated treatment well Patient left: with call bell/phone within reach;in bed;with family/visitor present   PT Visit Diagnosis: Difficulty in walking, not elsewhere classified (R26.2) Pain - Right/Left: Left Pain - part of body: Knee     Time: 9233-0076 PT Time Calculation (min) (ACUTE ONLY): 14 min  Charges:  $Gait Training: 8-22 mins                    G Codes:       [name], PT, DPT 08/29/2017 Pager: [phone]  York Ram E 08/29/2017, 3:13 PM

## 2017-09-01 ENCOUNTER — Other Ambulatory Visit: Payer: Self-pay | Admitting: Internal Medicine

## 2017-10-08 ENCOUNTER — Encounter: Payer: Self-pay | Admitting: Family Medicine

## 2017-10-08 ENCOUNTER — Other Ambulatory Visit: Payer: Self-pay

## 2017-10-08 ENCOUNTER — Ambulatory Visit: Payer: Medicare Other | Admitting: Family Medicine

## 2017-10-08 ENCOUNTER — Encounter: Payer: Self-pay | Admitting: General Practice

## 2017-10-08 VITALS — BP 123/83 | HR 74 | Temp 98.3°F | Resp 16 | Ht 63.0 in | Wt 206.5 lb

## 2017-10-08 DIAGNOSIS — I10 Essential (primary) hypertension: Secondary | ICD-10-CM | POA: Diagnosis not present

## 2017-10-08 DIAGNOSIS — E038 Other specified hypothyroidism: Secondary | ICD-10-CM

## 2017-10-08 LAB — BASIC METABOLIC PANEL
BUN: 20 mg/dL (ref 6–23)
CALCIUM: 9.5 mg/dL (ref 8.4–10.5)
CHLORIDE: 103 meq/L (ref 96–112)
CO2: 29 meq/L (ref 19–32)
CREATININE: 1.2 mg/dL (ref 0.40–1.20)
GFR: 47.3 mL/min — ABNORMAL LOW (ref >60.00)
GLUCOSE: 85 mg/dL (ref 70–99)
Potassium: 4.6 mEq/L (ref 3.5–5.1)
Sodium: 139 mEq/L (ref 135–145)

## 2017-10-08 LAB — LIPID PANEL
CHOL/HDL RATIO: 2
Cholesterol: 153 mg/dL (ref 0–200)
HDL: 63.6 mg/dL (ref >39.00)
LDL CALC: 75 mg/dL (ref 0–99)
NONHDL: 89.48
Triglycerides: 73 mg/dL (ref 0.0–149.0)
VLDL: 14.6 mg/dL (ref 0.0–40.0)

## 2017-10-08 LAB — HEPATIC FUNCTION PANEL
ALT: 10 U/L (ref 0–35)
AST: 15 U/L (ref 0–37)
Albumin: 4 g/dL (ref 3.5–5.2)
Alkaline Phosphatase: 64 U/L (ref 39–117)
Bilirubin, Direct: 0.1 mg/dL (ref 0.0–0.3)
TOTAL PROTEIN: 7 g/dL (ref 6.0–8.3)
Total Bilirubin: 0.6 mg/dL (ref 0.2–1.2)

## 2017-10-08 LAB — CBC WITH DIFFERENTIAL/PLATELET
BASOS ABS: 0.1 10*3/uL (ref 0.0–0.1)
Basophils Relative: 1.3 % (ref 0.0–3.0)
EOS ABS: 0.4 10*3/uL (ref 0.0–0.7)
Eosinophils Relative: 5.6 % — ABNORMAL HIGH (ref 0.0–5.0)
HEMATOCRIT: 38.5 % (ref 36.0–46.0)
HEMOGLOBIN: 12.6 g/dL (ref 12.0–15.0)
LYMPHS PCT: 17.1 % (ref 12.0–46.0)
Lymphs Abs: 1.1 10*3/uL (ref 0.7–4.0)
MCHC: 32.7 g/dL (ref 30.0–36.0)
MCV: 88.9 fl (ref 78.0–100.0)
MONO ABS: 0.5 10*3/uL (ref 0.1–1.0)
Monocytes Relative: 8.7 % (ref 3.0–12.0)
NEUTROS ABS: 4.2 10*3/uL (ref 1.4–7.7)
Neutrophils Relative %: 67.3 % (ref 43.0–77.0)
PLATELETS: 221 10*3/uL (ref 150.0–400.0)
RBC: 4.33 Mil/uL (ref 3.87–5.11)
RDW: 15 % (ref 11.5–15.5)
WBC: 6.3 10*3/uL (ref 4.0–10.5)

## 2017-10-08 LAB — TSH: TSH: 1.51 u[IU]/mL (ref 0.35–4.50)

## 2017-10-08 NOTE — Assessment & Plan Note (Signed)
Chronic problem.  Currently asymptomatic.  Check labs.  Adjust meds prn  

## 2017-10-08 NOTE — Progress Notes (Signed)
   Subjective:    Patient ID: Kristine Olson, female    DOB: [birth year], 71 y.o.   MRN: [id number]  HPI HTN- chronic problem, on Metoprolol and Spironolactone w/ good control.  Denies CP, SOB, HAs, visual changes, edema.  Hypothyroid- chronic problem, on Levothyroxine 176mcg daily.  Denies excessive fatigue.  No changes to skin/hair/nails.   Review of Systems For ROS see HPI     Objective:   Physical Exam  Constitutional: She is oriented to person, place, and time. She appears well-developed and well-nourished. No distress.  HENT:  Head: Normocephalic and atraumatic.  Eyes: Conjunctivae and EOM are normal. Pupils are equal, round, and reactive to light.  Neck: Normal range of motion. Neck supple. No thyromegaly present.  Cardiovascular: Normal rate, regular rhythm, normal heart sounds and intact distal pulses.  No murmur heard. Pulmonary/Chest: Effort normal and breath sounds normal. No respiratory distress.  Abdominal: Soft. She exhibits no distension. There is no tenderness.  Musculoskeletal: She exhibits no edema.  Lymphadenopathy:    She has no cervical adenopathy.  Neurological: She is alert and oriented to person, place, and time.  Skin: Skin is warm and dry.  Psychiatric: She has a normal mood and affect. Her behavior is normal.  Vitals reviewed.         Assessment & Plan:

## 2017-10-08 NOTE — Patient Instructions (Signed)
Schedule your complete physical in 6 months and your Medicare Wellness Visit with [facility] notify you of your lab results and make any changes if needed Continue to work on healthy diet and regular exercise- you can do it! Call with any questions or concerns Happy Holidays!!!

## 2017-10-08 NOTE — Assessment & Plan Note (Signed)
Chronic problem.  Well controlled today.  Asymptomatic.  Check labs.  No anticipated med changes.  Will follow. 

## 2017-10-31 ENCOUNTER — Encounter: Payer: Self-pay | Admitting: Internal Medicine

## 2017-10-31 ENCOUNTER — Ambulatory Visit: Payer: Medicare Other | Admitting: Internal Medicine

## 2017-10-31 VITALS — BP 116/58 | HR 74 | Ht 63.0 in | Wt 210.6 lb

## 2017-10-31 DIAGNOSIS — G4733 Obstructive sleep apnea (adult) (pediatric): Secondary | ICD-10-CM

## 2017-10-31 NOTE — Assessment & Plan Note (Signed)
Her body weight is detrimental to her sleep apnea.  We discussed a change in lifestyle to permit meaningful weight loss as a valuable treatment option.

## 2017-10-31 NOTE — Assessment & Plan Note (Signed)
CPAP pressure works well for her but she does not like the mask and hose and says she was happier sleeping separately from her husband so her ongoing snoring does not bother him.  We discussed alternatives. Plan-referral for consideration of oral appliance.

## 2017-10-31 NOTE — Patient Instructions (Signed)
Order- referral to orthodontist Dr Oneal Grout   Consider oral appliance for treating OSA  We will be happy to see you again as needed

## 2017-10-31 NOTE — Progress Notes (Signed)
10/31/17-72 year old female never smoker followed for OSA, coming to reestablish. Sleep Consult; Dme [facility]. former [place] patient; has CPAP but does not wear llike she should-hard to get use to it. HST attached.  HST 04/21/13-[facility]I 37.8/hour, desaturation to 73%, body weight 214 pounds CPAP 14/Advanced Medical problem list includes gastric ulcer, iron deficiency anemia, GERD, HBP, hypothyroid Epworth score 4 She uses CPAP "more often than not but never comfortable with it because she does not like the "tethering" by hose and mask.  Had knee surgery in September and was sleeping separately from her husband during that time without CPAP and feeling happy.  He complains of her snoring when she is with him.  She asks about alternatives. ENT surgery-tonsils.  Denies lung or heart disease. Describes onset of nasal congestion most afternoons, sometimes lasting into the night without specific trigger or season.  Prior to Admission medications   Medication Sig Start Date End Date Taking? Authorizing Provider  cetirizine (ZYRTEC) 10 MG tablet Take 10 mg by mouth daily as needed for allergies.   Yes [provider]  cholecalciferol (VITAMIN D) 1000 units tablet Take 1,000 Units by mouth daily.   Yes [provider]  citalopram (CELEXA) 20 MG tablet Take 20 mg by mouth at bedtime.    Yes [provider]  levothyroxine (SYNTHROID, LEVOTHROID) 112 MCG tablet TAKE 1 TABLET DAILY EXCEPT FOR 1 AND 1/2 TABLETS ON [facility] 07/16/17  Yes Midge Minium, MD  metoprolol tartrate (LOPRESSOR) 25 MG tablet TAKE 1/2 TABLET BY MOUTH TWICE A DAY Patient taking differently: TAKE 1 TABLET BY MOUTH DAILY 06/01/17  Yes Midge Minium, MD  pantoprazole (PROTONIX) 20 MG tablet Take 1 tablet (20 mg total) by mouth daily before breakfast. Patient taking differently: Take 20-40 mg by mouth every other day. ALTERNATING DAYS TAKES 20 MG 1 DAY AND 40 MG THE NEXT DAY 07/09/17  Yes Gatha Mayer, MD   spironolactone (ALDACTONE) 25 MG tablet TAKE 1 TABLET (25 MG TOTAL) BY MOUTH DAILY. Patient taking differently: Take 25 mg by mouth every evening.  06/13/17  Yes Midge Minium, MD   Past Medical History:  Diagnosis Date  . Acute pyloric channel ulcer with hemorrhage on Xarelto   . Anemia    history of anemia after bleeding ulcer  . Arthritis    ostearthritis - right  medial knee  . Complication of anesthesia    was given a medication while waiting to go to surgery that caused burning sensation on scalp was given another medication and the burning went away  . Depression   . GERD (gastroesophageal reflux disease)    "heartburn daily"-TUMS helpful  . History of blood transfusion    2 units PRBC  . Hx of sinus tachycardia    rarely any problems since metoprolol use  . Hyperlipidemia    patient unaware, has never taking medication  . Hypertension   . Hypothyroidism   . Sleep apnea    uses c pap  . Varicose vein of leg    Past Surgical History:  Procedure Laterality Date  . BIOPSY BREAST     2 biopsies & 2 aspirations  . CESAREAN SECTION    . COLONOSCOPY W/ POLYPECTOMY  multiple since 2007   adenomas, diverticulosis;last 2015  . ESOPHAGOGASTRODUODENOSCOPY (EGD) WITH PROPOFOL N/A 05/29/2016   Procedure: ESOPHAGOGASTRODUODENOSCOPY (EGD) WITH PROPOFOL;  Surgeon: Gatha Mayer, MD;  Location: [facility];  Service: Endoscopy;  Laterality: N/A;  . PARTIAL KNEE ARTHROPLASTY Right 05/22/2016  Procedure: RIGHT KNEE MEDIAL UNICOMPARTMENTAL ARTHROPLASTY;  Surgeon: Gaynelle Arabian, MD;  Location: [facility];  Service: Orthopedics;  Laterality: Right;  . TONSILLECTOMY AND ADENOIDECTOMY    . TOTAL KNEE ARTHROPLASTY Left 08/27/2017   Procedure: LEFT TOTAL KNEE ARTHROPLASTY;  Surgeon: Gaynelle Arabian, MD;  Location: [facility];  Service: Orthopedics;  Laterality: Left;  . WISDOM TOOTH EXTRACTION     Family History  Problem Relation Age of Onset  . Osteoporosis Mother   . Thyroid disease Mother    . Stroke Maternal Aunt         X 3   . Diabetes Maternal Aunt   . Heart attack Maternal Grandfather        > 70  . Melanoma Brother   . Multiple sclerosis Daughter 63  . Deep vein thrombosis Paternal Aunt   . Other Paternal Aunt        Phlebitis without deep venous thrombosis  . Colon cancer Neg Hx    Social History   Socioeconomic History  . Marital status: Married    Spouse name: Not on file  . Number of children: Not on file  . Years of education: Not on file  . Highest education level: Not on file  Social Needs  . Financial resource strain: Not on file  . Food insecurity - worry: Not on file  . Food insecurity - inability: Not on file  . Transportation needs - medical: Not on file  . Transportation needs - non-medical: Not on file  Occupational History  . Not on file  Tobacco Use  . Smoking status: Never Smoker  . Smokeless tobacco: Never Used  Substance and Sexual Activity  . Alcohol use: Yes    Alcohol/week: 0.0 oz    Comment:  very rarely  . Drug use: No  . Sexual activity: Yes    Birth control/protection: Post-menopausal  Other Topics Concern  . Not on file  Social History Narrative  . Not on file   ROS-see HPI   + = positive Constitutional:    weight loss, night sweats, fevers, chills, fatigue, lassitude. HEENT:    headaches, difficulty swallowing, tooth/dental problems, sore throat,       sneezing, itching, ear ache, nasal congestion, post nasal drip, snoring CV:    chest pain, orthopnea, PND, swelling in lower extremities, anasarca,                                  dizziness, palpitations Resp:   shortness of breath with exertion or at rest.                productive cough,   non-productive cough, coughing up of blood.              change in color of mucus.  wheezing.   Skin:    rash or lesions. GI:  No-   heartburn, indigestion, abdominal pain, nausea, vomiting, diarrhea,                 change in bowel habits, loss of appetite GU: dysuria, change  in color of urine, no urgency or frequency.   flank pain. MS:   joint pain, stiffness, decreased range of motion, back pain. Neuro-     nothing unusual Psych:  change in mood or affect.  depression or anxiety.   memory loss.  OBJ- Physical Exam General- Alert, Oriented, Affect-appropriate, Distress- none acute, + overweight Skin- rash-none, lesions-  none, excoriation- none Lymphadenopathy- none Head- atraumatic            Eyes- Gross vision intact, PERRLA, conjunctivae and secretions clear            Ears- Hearing, canals-normal            Nose- Clear, no-Septal dev, mucus, polyps, erosion, perforation             Throat- Mallampati III-IV , mucosa clear , drainage- none, tonsils- atrophic Neck- flexible , trachea midline, no stridor , thyroid nl, carotid no bruit Chest - symmetrical excursion , unlabored           Heart/CV- RRR , no murmur , no gallop  , no rub, nl s1 s2                           - JVD- none , edema- none, stasis changes- none, varices- none           Lung- clear to P&A, wheeze- none, cough- none , dullness-none, rub- none           Chest wall-  Abd-  Br/ Gen/ Rectal- Not done, not indicated Extrem- cyanosis- none, clubbing, none, atrophy- none, strength- nl Neuro- grossly intact to observation

## 2017-11-26 LAB — HM MAMMOGRAPHY: HM Mammogram: NORMAL (ref 0–4)

## 2017-11-29 ENCOUNTER — Encounter: Payer: Self-pay | Admitting: General Practice

## 2017-12-03 ENCOUNTER — Other Ambulatory Visit: Payer: Self-pay | Admitting: Family Medicine

## 2017-12-13 ENCOUNTER — Other Ambulatory Visit: Payer: Self-pay | Admitting: Family Medicine

## 2018-01-17 ENCOUNTER — Other Ambulatory Visit: Payer: Self-pay | Admitting: Family Medicine

## 2018-02-18 ENCOUNTER — Other Ambulatory Visit: Payer: Self-pay

## 2018-02-18 ENCOUNTER — Encounter: Payer: Self-pay | Admitting: Family Medicine

## 2018-02-18 ENCOUNTER — Ambulatory Visit: Payer: Medicare Other | Admitting: Family Medicine

## 2018-02-18 VITALS — BP 120/80 | HR 69 | Temp 98.2°F | Resp 16 | Ht 63.0 in | Wt 217.1 lb

## 2018-02-18 DIAGNOSIS — K219 Gastro-esophageal reflux disease without esophagitis: Secondary | ICD-10-CM | POA: Diagnosis not present

## 2018-02-18 MED ORDER — RANITIDINE HCL 300 MG PO TABS: 300.0000 mg | ORAL_TABLET | Freq: Every day | ORAL | 3 refills | Status: DC

## 2018-02-18 NOTE — Progress Notes (Signed)
   Subjective:    Patient ID: Kristine Olson, female    DOB: Feb 27, 1946, 72 y.o.   MRN: [id number]  HPI GERD- pt has hx of this and was previously on Protonix for 1 yr due to stomach ulcer after knee surgery.  GI weaned her off this and she was doing ok as long as she avoided 'spicy or certain foods' but now 'I can get symptoms with water'.  Has been taking OTC Pepcid w/o relief and got OTC Zantac last week but is having difficulty knowing when to take this due to thyroid medication.   Review of Systems For ROS see HPI     Objective:   Physical Exam  HENT:  Head: Normocephalic and atraumatic.  Abdominal: Soft. Bowel sounds are normal. She exhibits no distension and no mass. There is no tenderness. There is no guarding.  Psychiatric: She has a normal mood and affect. Her behavior is normal.  Vitals reviewed.         Assessment & Plan:

## 2018-02-18 NOTE — Assessment & Plan Note (Signed)
Deteriorated.  Pt has been attempting to control w/ OTC meds w/o success.  Will start high dose Ranitidine nightly- which won't interfere w/ her thyroid medication.  Reviewed dietary and lifestyle modifications that will improve sxs.  Pt expressed understanding and is in agreement w/ plan.

## 2018-02-18 NOTE — Patient Instructions (Signed)
Follow up as needed or as scheduled Start the Ranitidine nightly If no improvement in reflux symptoms in the next 10-14 days, let me know and we can send in the Protonix Continue to work on healthy diet and regular exercise- you can do it! Call with any questions or concerns Happy Spring!!!

## 2018-04-23 NOTE — Progress Notes (Addendum)
Subjective:   Kristine Olson is a 72 y.o. female who presents for Medicare Annual (Subsequent) preventive examination.  Review of Systems:  No ROS.  Medicare Wellness Visit. Additional risk factors are reflected in the social history.  Cardiac Risk Factors include: hypertension;dyslipidemia;advanced age (>16men, >93 women);obesity (BMI >30kg/m2);family history of premature cardiovascular disease   Sleep patterns: Sleeps 6-7 hours, wears CPAP.  Home Safety/Smoke Alarms: Feels safe in home. Smoke alarms in place.  Living environment; residence and Firearm Safety: Lives with husband in 1 story home.  Seat Belt Safety/Bike Helmet: Wears seat belt.   Female:   Pap-N/A       Mammo-11/26/2017, normal. Followed by GYN   Dexa scan-08/30/2016, Osteopenia. Followed by GYN.             CCS-Colonoscopy 09/21/2014, polyp. Recall 5 years.      Objective:     Vitals: BP 110/60 (BP Location: Left Arm, Patient Position: Sitting, Cuff Size: Normal)   Pulse 60   Temp 98.3 F (36.8 C) (Temporal)   Resp 16   Ht 5\' 3"  (1.6 m)   Wt 207 lb 4 oz (94 kg)   SpO2 98%   BMI 36.71 kg/m   Body mass index is 36.71 kg/m.  Advanced Directives 04/24/2018 08/27/2017 08/27/2017 08/21/2017 04/20/2017 05/29/2016 05/29/2016  Does Patient Have a Medical Advance Directive? Yes Yes Yes Yes Yes Yes Yes  Type of Paramedic of [place];Living will [place];Living will [place];Living will [place];Living will Living will;Healthcare Power of [place]  Does patient want to make changes to medical advance directive? - No - Patient declined - - - No - Patient declined -  Copy of [place] in Chart? - No - copy requested No - copy requested No - copy requested No - copy requested No - copy requested -    Tobacco Social History   Tobacco Use  Smoking Status Never  Smoker  Smokeless Tobacco Never Used     Counseling given: Not Answered    Past Medical History:  Diagnosis Date  . Acute pyloric channel ulcer with hemorrhage on Xarelto   . Anemia    history of anemia after bleeding ulcer  . Arthritis    ostearthritis - right  medial knee  . Complication of anesthesia    was given a medication while waiting to go to surgery that caused burning sensation on scalp was given another medication and the burning went away  . Depression   . GERD (gastroesophageal reflux disease)    "heartburn daily"-TUMS helpful  . History of blood transfusion    2 units PRBC  . Hx of sinus tachycardia    rarely any problems since metoprolol use  . Hyperlipidemia    patient unaware, has never taking medication  . Hypertension   . Hypothyroidism   . Sleep apnea    uses c pap  . Varicose vein of leg    Past Surgical History:  Procedure Laterality Date  . BIOPSY BREAST     2 biopsies & 2 aspirations  . CESAREAN SECTION    . COLONOSCOPY W/ POLYPECTOMY  multiple since 2007   adenomas, diverticulosis;last 2015  . ESOPHAGOGASTRODUODENOSCOPY (EGD) WITH PROPOFOL N/A 05/29/2016   Procedure: ESOPHAGOGASTRODUODENOSCOPY (EGD) WITH PROPOFOL;  Surgeon: Gatha Mayer, MD;  Location: [facility];  Service: Endoscopy;  Laterality: N/A;  . PARTIAL KNEE ARTHROPLASTY Right 05/22/2016  Procedure: RIGHT KNEE MEDIAL UNICOMPARTMENTAL ARTHROPLASTY;  Surgeon: Gaynelle Arabian, MD;  Location: [facility];  Service: Orthopedics;  Laterality: Right;  . TONSILLECTOMY AND ADENOIDECTOMY    . TOTAL KNEE ARTHROPLASTY Left 08/27/2017   Procedure: LEFT TOTAL KNEE ARTHROPLASTY;  Surgeon: Gaynelle Arabian, MD;  Location: [facility];  Service: Orthopedics;  Laterality: Left;  . WISDOM TOOTH EXTRACTION     Family History  Problem Relation Age of Onset  . Osteoporosis Mother   . Thyroid disease Mother   . Rheum arthritis Mother   . Stroke Maternal Aunt         X 3   . Diabetes Maternal Aunt   . Heart  attack Maternal Grandfather        > 70  . Melanoma Brother   . Multiple sclerosis Daughter 66  . Deep vein thrombosis Paternal Aunt   . Other Paternal Aunt        Phlebitis without deep venous thrombosis  . Hypertension Father   . Alcohol abuse Father   . Liver disease Father   . Cancer Maternal Grandmother   . Depression Daughter   . Bipolar disorder Daughter   . Colon cancer Neg Hx    Social History   Socioeconomic History  . Marital status: Married    Spouse name: Not on file  . Number of children: Not on file  . Years of education: Not on file  . Highest education level: Not on file  Occupational History  . Not on file  Social Needs  . Financial resource strain: Not on file  . Food insecurity:    Worry: Not on file    Inability: Not on file  . Transportation needs:    Medical: Not on file    Non-medical: Not on file  Tobacco Use  . Smoking status: Never Smoker  . Smokeless tobacco: Never Used  Substance and Sexual Activity  . Alcohol use: Yes    Alcohol/week: 0.0 oz    Comment:  very rarely  . Drug use: No  . Sexual activity: Yes    Birth control/protection: Post-menopausal  Lifestyle  . Physical activity:    Days per week: Not on file    Minutes per session: Not on file  . Stress: Not on file  Relationships  . Social connections:    Talks on phone: Not on file    Gets together: Not on file    Attends religious service: Not on file    Active member of club or organization: Not on file    Attends meetings of clubs or organizations: Not on file    Relationship status: Not on file  Other Topics Concern  . Not on file  Social History Narrative  . Not on file    Outpatient Encounter Medications as of 04/24/2018  Medication Sig  . cetirizine (ZYRTEC) 10 MG tablet Take 10 mg by mouth daily as needed for allergies.  . cholecalciferol (VITAMIN D) 1000 units tablet Take 1,000 Units by mouth daily.  . citalopram (CELEXA) 20 MG tablet Take 20 mg by mouth at  bedtime.   Marland Kitchen levothyroxine (SYNTHROID, LEVOTHROID) 112 MCG tablet TAKE 1 TABLET DAILY EXCEPT FOR 1 AND 1/2 TABLETS ON WEDNESDAY  . metoprolol tartrate (LOPRESSOR) 25 MG tablet TAKE 1/2 TABLET BY MOUTH TWICE A DAY  . ranitidine (ZANTAC) 300 MG tablet Take 1 tablet (300 mg total) by mouth at bedtime.  Marland Kitchen spironolactone (ALDACTONE) 25 MG tablet TAKE 1 TABLET (25 MG TOTAL) BY MOUTH DAILY.  No facility-administered encounter medications on file as of 04/24/2018.     Activities of Daily Living In your present state of health, do you have any difficulty performing the following activities: 04/24/2018 10/08/2017  Hearing? N N  Vision? N N  Difficulty concentrating or making decisions? N N  Walking or climbing stairs? N N  Dressing or bathing? N N  Doing errands, shopping? N N  Preparing Food and eating ? N -  Using the Toilet? N -  In the past six months, have you accidently leaked urine? Y -  Comment Wears poise, followed by GYN -  Do you have problems with loss of bowel control? N -  Managing your Medications? N -  Managing your Finances? N -  Housekeeping or managing your Housekeeping? N -  Some recent data might be hidden    Patient Care Team: Midge Minium, MD as PCP - General (Family Medicine) Charlynn Grimes, MD as Referring Physician (Obstetrics and Gynecology) Gatha Mayer, MD as Consulting Physician (Gastroenterology) Marvia Pickles (Psychiatry) Gaynelle Arabian, MD as Consulting Physician (Orthopedic Surgery) Deneise Lever, MD as Consulting Physician (Pulmonary Disease) Minus Breeding, MD as Consulting Physician (Cardiology)    Assessment:   This is a routine wellness examination for [place].  Exercise Activities and Dietary recommendations Current Exercise Habits: Structured exercise class, Type of exercise: Other - see comments(rec. bike), Time (Minutes): 30, Frequency (Times/Week): 2, Weekly Exercise (Minutes/Week): 60, Exercise limited by: None identified    Diet (meal preparation, eat out, water intake, caffeinated beverages, dairy products, fruits and vegetables): Drinks water and sweet tea.   Follows Weight Watchers food plan 75% of time. Eats 3 meals/day.   Goals    . Weight (lb) < 197 lb (89.4 kg)     Lose weight by continuing Weight Watchers.     . Weight (lb) < 200 lb (90.7 kg)     Lose weight by increasing activity and continuing weight watchers.        Fall Risk Fall Risk  04/24/2018 10/08/2017 04/20/2017 04/20/2017 03/20/2017  Falls in the past year? No No No No No    Depression Screen PHQ 2/9 Scores 04/24/2018 10/08/2017 04/20/2017 03/20/2017  PHQ - 2 Score 0 0 1 0  PHQ- 9 Score 1 0 1 0  Exception Documentation - - - -     Cognitive Function MMSE - Mini Mental State Exam 04/24/2018  Orientation to time 5  Orientation to Place 5  Registration 3  Attention/ Calculation 5  Recall 3  Language- name 2 objects 2  Language- repeat 1  Language- follow 3 step command 3  Language- read & follow direction 1  Write a sentence 1  Copy design 1  Total score 30        Immunization History  Administered Date(s) Administered  . Hepatitis A, Adult 10/04/2012  . Hepatitis B, adult 10/04/2012  . IPV 10/04/2012  . Influenza,inj,Quad PF,6+ Mos 10/09/2016, 07/24/2017  . Influenza-Unspecified 06/23/2012, 06/27/2017  . Meningococcal Conjugate 10/04/2012  . Pneumococcal Conjugate-13 04/17/2016  . Pneumococcal Polysaccharide-23 04/20/2017  . Td 10/24/2003  . Tdap 10/04/2012    Screening Tests Health Maintenance  Topic Date Due  . Hepatitis C Screening  02/19/2019 (Originally 1946-03-28)  . INFLUENZA VACCINE  05/23/2018  . DEXA SCAN  08/30/2018  . COLONOSCOPY  09/22/2019  . MAMMOGRAM  11/27/2019  . TETANUS/TDAP  10/04/2022  . PNA vac Low Risk Adult  Completed        Plan:  Schedule eye exam.   Bring a copy of your living will and/or healthcare power of attorney to your next office visit.  Continue doing brain  stimulating activities (puzzles, reading, adult coloring books, staying active) to keep memory sharp.    I have personally reviewed and noted the following in the patient's chart:   . Medical and social history . Use of alcohol, tobacco or illicit drugs  . Current medications and supplements . Functional ability and status . Nutritional status . Physical activity . Advanced directives . List of other physicians . Hospitalizations, surgeries, and ER visits in previous 12 months . Vitals . Screenings to include cognitive, depression, and falls . Referrals and appointments  In addition, I have reviewed and discussed with patient certain preventive protocols, quality metrics, and best practice recommendations. A written personalized care plan for preventive services as well as general preventive health recommendations were provided to patient.     Gerilyn Nestle, RN  04/24/2018   Reviewed documentation provided by RN and agree w/ above.  Annye Asa, MD

## 2018-04-24 ENCOUNTER — Encounter: Payer: Self-pay | Admitting: Family Medicine

## 2018-04-24 ENCOUNTER — Ambulatory Visit (INDEPENDENT_AMBULATORY_CARE_PROVIDER_SITE_OTHER): Payer: Medicare Other | Admitting: Family Medicine

## 2018-04-24 ENCOUNTER — Ambulatory Visit (INDEPENDENT_AMBULATORY_CARE_PROVIDER_SITE_OTHER): Payer: Medicare Other

## 2018-04-24 ENCOUNTER — Other Ambulatory Visit: Payer: Self-pay

## 2018-04-24 VITALS — BP 110/60 | HR 60 | Temp 98.3°F | Resp 16 | Ht 63.0 in | Wt 207.2 lb

## 2018-04-24 VITALS — BP 110/60 | HR 60 | Temp 98.3°F | Resp 16 | Ht 63.0 in | Wt 207.4 lb

## 2018-04-24 DIAGNOSIS — E669 Obesity, unspecified: Secondary | ICD-10-CM

## 2018-04-24 DIAGNOSIS — Z1283 Encounter for screening for malignant neoplasm of skin: Secondary | ICD-10-CM

## 2018-04-24 DIAGNOSIS — E038 Other specified hypothyroidism: Secondary | ICD-10-CM | POA: Diagnosis not present

## 2018-04-24 DIAGNOSIS — I10 Essential (primary) hypertension: Secondary | ICD-10-CM

## 2018-04-24 DIAGNOSIS — Z6836 Body mass index (BMI) 36.0-36.9, adult: Secondary | ICD-10-CM

## 2018-04-24 DIAGNOSIS — E559 Vitamin D deficiency, unspecified: Secondary | ICD-10-CM

## 2018-04-24 DIAGNOSIS — Z0000 Encounter for general adult medical examination without abnormal findings: Secondary | ICD-10-CM

## 2018-04-24 LAB — LIPID PANEL
CHOL/HDL RATIO: 3
Cholesterol: 175 mg/dL (ref 0–200)
HDL: 59.6 mg/dL (ref >39.00)
LDL CALC: 93 mg/dL (ref 0–99)
NonHDL: 115.74
Triglycerides: 113 mg/dL (ref 0.0–149.0)
VLDL: 22.6 mg/dL (ref 0.0–40.0)

## 2018-04-24 LAB — HEPATIC FUNCTION PANEL
ALK PHOS: 55 U/L (ref 39–117)
ALT: 10 U/L (ref 0–35)
AST: 16 U/L (ref 0–37)
Albumin: 4.1 g/dL (ref 3.5–5.2)
BILIRUBIN DIRECT: 0.1 mg/dL (ref 0.0–0.3)
BILIRUBIN TOTAL: 0.5 mg/dL (ref 0.2–1.2)
TOTAL PROTEIN: 6.9 g/dL (ref 6.0–8.3)

## 2018-04-24 LAB — CBC WITH DIFFERENTIAL/PLATELET
BASOS PCT: 1.2 % (ref 0.0–3.0)
Basophils Absolute: 0.1 10*3/uL (ref 0.0–0.1)
EOS ABS: 0.2 10*3/uL (ref 0.0–0.7)
Eosinophils Relative: 3.8 % (ref 0.0–5.0)
HEMATOCRIT: 37.9 % (ref 36.0–46.0)
Hemoglobin: 12.7 g/dL (ref 12.0–15.0)
LYMPHS ABS: 1.1 10*3/uL (ref 0.7–4.0)
LYMPHS PCT: 20.3 % (ref 12.0–46.0)
MCHC: 33.5 g/dL (ref 30.0–36.0)
MCV: 83.1 fl (ref 78.0–100.0)
MONO ABS: 0.6 10*3/uL (ref 0.1–1.0)
Monocytes Relative: 10.2 % (ref 3.0–12.0)
NEUTROS ABS: 3.6 10*3/uL (ref 1.4–7.7)
NEUTROS PCT: 64.5 % (ref 43.0–77.0)
PLATELETS: 193 10*3/uL (ref 150.0–400.0)
RBC: 4.56 Mil/uL (ref 3.87–5.11)
RDW: 15.7 % — AB (ref 11.5–15.5)
WBC: 5.5 10*3/uL (ref 4.0–10.5)

## 2018-04-24 LAB — BASIC METABOLIC PANEL
BUN: 19 mg/dL (ref 6–23)
CHLORIDE: 103 meq/L (ref 96–112)
CO2: 30 mEq/L (ref 19–32)
Calcium: 9.6 mg/dL (ref 8.4–10.5)
Creatinine, Ser: 1.14 mg/dL (ref 0.40–1.20)
GFR: 50.1 mL/min — ABNORMAL LOW (ref >60.00)
GLUCOSE: 93 mg/dL (ref 70–99)
Potassium: 4.8 mEq/L (ref 3.5–5.1)
SODIUM: 140 meq/L (ref 135–145)

## 2018-04-24 LAB — TSH: TSH: 0.19 u[IU]/mL — ABNORMAL LOW (ref 0.35–4.50)

## 2018-04-24 LAB — VITAMIN D 25 HYDROXY (VIT D DEFICIENCY, FRACTURES): VITD: 27.11 ng/mL — ABNORMAL LOW (ref 30.00–100.00)

## 2018-04-24 NOTE — Assessment & Plan Note (Signed)
Pt's PE WNL.  UTD on mammo, colonoscopy, immunizations.  Check labs.  Anticipatory guidance provided.  

## 2018-04-24 NOTE — Patient Instructions (Addendum)
Follow up in 6 months to recheck BP and thyroid We'll notify you of your lab results and make any changes if needed Continue to work on healthy diet and regular exercise- you're doing great!!! We'll call you with your Dermatology appt Call with any questions or concerns Happy 4th!!! Kristine Olson w/ [facility] [employer]!!!

## 2018-04-24 NOTE — Assessment & Plan Note (Signed)
Chronic problem.  Excellent control.  Asymptomatic.  Check labs.  No anticipated med changes.  Will follow. 

## 2018-04-24 NOTE — Assessment & Plan Note (Signed)
Chronic problem.  Currently asymptomatic.  Check labs.  Adjust meds prn  

## 2018-04-24 NOTE — Progress Notes (Signed)
   Subjective:    Patient ID: Kristine Olson, female    DOB: 1945/12/14, 72 y.o.   MRN: [id number]  HPI CPE- UTD on mammo, colonoscopy, immunizations.  Pt reports feeling good.   Review of Systems Patient reports no vision/ hearing changes, adenopathy,fever, weight change,  persistant/recurrent hoarseness , swallowing issues, chest pain, palpitations, edema, persistant/recurrent cough, hemoptysis, dyspnea (rest/exertional/paroxysmal nocturnal), gastrointestinal bleeding (melena, rectal bleeding), abdominal pain, significant heartburn, bowel changes, GU symptoms (dysuria, hematuria, incontinence), Gyn symptoms (abnormal  bleeding, pain),  syncope, focal weakness, memory loss, numbness & tingling, skin/hair/nail changes, abnormal bruising or bleeding, anxiety, or depression.     Objective:   Physical Exam General Appearance:    Alert, cooperative, no distress, appears stated age  Head:    Normocephalic, without obvious abnormality, atraumatic  Eyes:    PERRL, conjunctiva/corneas clear, EOM's intact, fundi    benign, both eyes  Ears:    Normal TM's and external ear canals, both ears  Nose:   Nares normal, septum midline, mucosa normal, no drainage    or sinus tenderness  Throat:   Lips, mucosa, and tongue normal; teeth and gums normal  Neck:   Supple, symmetrical, trachea midline, no adenopathy;    Thyroid: no enlargement/tenderness/nodules  Back:     Symmetric, no curvature, ROM normal, no CVA tenderness  Lungs:     Clear to auscultation bilaterally, respirations unlabored  Chest Wall:    No tenderness or deformity   Heart:    Regular rate and rhythm, S1 and S2 normal, no murmur, rub   or gallop  Breast Exam:    Deferred to GYN  Abdomen:     Soft, non-tender, bowel sounds active all four quadrants,    no masses, no organomegaly  Genitalia:    Deferred to GYN  Rectal:    Extremities:   Extremities normal, atraumatic, no cyanosis or edema  Pulses:   2+ and symmetric all extremities    Skin:   Skin color, texture, turgor normal, no rashes or lesions  Lymph nodes:   Cervical, supraclavicular, and axillary nodes normal  Neurologic:   CNII-XII intact, normal strength, sensation and reflexes    throughout          Assessment & Plan:

## 2018-04-24 NOTE — Assessment & Plan Note (Signed)
Pt has hx of this.  Check labs and replete prn. 

## 2018-04-24 NOTE — Patient Instructions (Addendum)
Schedule eye exam.   Bring a copy of your living will and/or healthcare power of attorney to your next office visit.  Continue doing brain stimulating activities (puzzles, reading, adult coloring books, staying active) to keep memory sharp.   Health Maintenance, Female Adopting a healthy lifestyle and getting preventive care can go a long way to promote health and wellness. Talk with your health care provider about what schedule of regular examinations is right for you. This is a good chance for you to check in with your provider about disease prevention and staying healthy. In between checkups, there are plenty of things you can do on your own. Experts have done a lot of research about which lifestyle changes and preventive measures are most likely to keep you healthy. Ask your health care provider for more information. Weight and diet Eat a healthy diet  Be sure to include plenty of vegetables, fruits, low-fat dairy products, and lean protein.  Do not eat a lot of foods high in solid fats, added sugars, or salt.  Get regular exercise. This is one of the most important things you can do for your health. ? Most adults should exercise for at least 150 minutes each week. The exercise should increase your heart rate and make you sweat (moderate-intensity exercise). ? Most adults should also do strengthening exercises at least twice a week. This is in addition to the moderate-intensity exercise.  Maintain a healthy weight  Body mass index (BMI) is a measurement that can be used to identify possible weight problems. It estimates body fat based on height and weight. Your health care provider can help determine your BMI and help you achieve or maintain a healthy weight.  For females 51 years of age and older: ? A BMI below 18.5 is considered underweight. ? A BMI of 18.5 to 24.9 is normal. ? A BMI of 25 to 29.9 is considered overweight. ? A BMI of 30 and above is considered obese.  Watch levels  of cholesterol and blood lipids  You should start having your blood tested for lipids and cholesterol at 70 years of age, then have this test every 5 years.  You may need to have your cholesterol levels checked more often if: ? Your lipid or cholesterol levels are high. ? You are older than 70 years of age. ? You are at high risk for heart disease.  Cancer screening Lung Cancer  Lung cancer screening is recommended for adults 16-30 years old who are at high risk for lung cancer because of a history of smoking.  A yearly low-dose CT scan of the lungs is recommended for people who: ? Currently smoke. ? Have quit within the past 15 years. ? Have at least a 30-pack-year history of smoking. A pack year is smoking an average of one pack of cigarettes a day for 1 year.  Yearly screening should continue until it has been 15 years since you quit.  Yearly screening should stop if you develop a health problem that would prevent you from having lung cancer treatment.  Breast Cancer  Practice breast self-awareness. This means understanding how your breasts normally appear and feel.  It also means doing regular breast self-exams. Let your health care provider know about any changes, no matter how small.  If you are in your 20s or 30s, you should have a clinical breast exam (CBE) by a health care provider every 1-3 years as part of a regular health exam.  If you are 40 or  older, have a CBE every year. Also consider having a breast X-ray (mammogram) every year.  If you have a family history of breast cancer, talk to your health care provider about genetic screening.  If you are at high risk for breast cancer, talk to your health care provider about having an MRI and a mammogram every year.  Breast cancer gene (BRCA) assessment is recommended for women who have family members with BRCA-related cancers. BRCA-related cancers include: ? Breast. ? Ovarian. ? Tubal. ? Peritoneal  cancers.  Results of the assessment will determine the need for genetic counseling and BRCA1 and BRCA2 testing.  Cervical Cancer Your health care provider may recommend that you be screened regularly for cancer of the pelvic organs (ovaries, uterus, and vagina). This screening involves a pelvic examination, including checking for microscopic changes to the surface of your cervix (Pap test). You may be encouraged to have this screening done every 3 years, beginning at age 83.  For women ages 57-65, health care providers may recommend pelvic exams and Pap testing every 3 years, or they may recommend the Pap and pelvic exam, combined with testing for human papilloma virus (HPV), every 5 years. Some types of HPV increase your risk of cervical cancer. Testing for HPV may also be done on women of any age with unclear Pap test results.  Other health care providers may not recommend any screening for nonpregnant women who are considered low risk for pelvic cancer and who do not have symptoms. Ask your health care provider if a screening pelvic exam is right for you.  If you have had past treatment for cervical cancer or a condition that could lead to cancer, you need Pap tests and screening for cancer for at least 20 years after your treatment. If Pap tests have been discontinued, your risk factors (such as having a new sexual partner) need to be reassessed to determine if screening should resume. Some women have medical problems that increase the chance of getting cervical cancer. In these cases, your health care provider may recommend more frequent screening and Pap tests.  Colorectal Cancer  This type of cancer can be detected and often prevented.  Routine colorectal cancer screening usually begins at 70 years of age and continues through 70 years of age.  Your health care provider may recommend screening at an earlier age if you have risk factors for colon cancer.  Your health care provider may also  recommend using home test kits to check for hidden blood in the stool.  A small camera at the end of a tube can be used to examine your colon directly (sigmoidoscopy or colonoscopy). This is done to check for the earliest forms of colorectal cancer.  Routine screening usually begins at age 43.  Direct examination of the colon should be repeated every 5-10 years through 70 years of age. However, you may need to be screened more often if early forms of precancerous polyps or small growths are found.  Skin Cancer  Check your skin from head to toe regularly.  Tell your health care provider about any new moles or changes in moles, especially if there is a change in a mole's shape or color.  Also tell your health care provider if you have a mole that is larger than the size of a pencil eraser.  Always use sunscreen. Apply sunscreen liberally and repeatedly throughout the day.  Protect yourself by wearing long sleeves, pants, a wide-brimmed hat, and sunglasses whenever you are outside.  Heart disease, diabetes, and high blood pressure  High blood pressure causes heart disease and increases the risk of stroke. High blood pressure is more likely to develop in: ? People who have blood pressure in the high end of the normal range (130-139/85-89 mm Hg). ? People who are overweight or obese. ? People who are African American.  If you are 70-78 years of age, have your blood pressure checked every 3-5 years. If you are 24 years of age or older, have your blood pressure checked every year. You should have your blood pressure measured twice-once when you are at a hospital or clinic, and once when you are not at a hospital or clinic. Record the average of the two measurements. To check your blood pressure when you are not at a hospital or clinic, you can use: ? An automated blood pressure machine at a pharmacy. ? A home blood pressure monitor.  If you are between 77 years and 5 years old, ask your  health care provider if you should take aspirin to prevent strokes.  Have regular diabetes screenings. This involves taking a blood sample to check your fasting blood sugar level. ? If you are at a normal weight and have a low risk for diabetes, have this test once every three years after 70 years of age. ? If you are overweight and have a high risk for diabetes, consider being tested at a younger age or more often. Preventing infection Hepatitis B  If you have a higher risk for hepatitis B, you should be screened for this virus. You are considered at high risk for hepatitis B if: ? You were born in a country where hepatitis B is common. Ask your health care provider which countries are considered high risk. ? Your parents were born in a high-risk country, and you have not been immunized against hepatitis B (hepatitis B vaccine). ? You have HIV or AIDS. ? You use needles to inject street drugs. ? You live with someone who has hepatitis B. ? You have had sex with someone who has hepatitis B. ? You get hemodialysis treatment. ? You take certain medicines for conditions, including cancer, organ transplantation, and autoimmune conditions.  Hepatitis C  Blood testing is recommended for: ? Everyone born from [birth year] through 1963. ? Anyone with known risk factors for hepatitis C.  Sexually transmitted infections (STIs)  You should be screened for sexually transmitted infections (STIs) including gonorrhea and chlamydia if: ? You are sexually active and are younger than 70 years of age. ? You are older than 70 years of age and your health care provider tells you that you are at risk for this type of infection. ? Your sexual activity has changed since you were last screened and you are at an increased risk for chlamydia or gonorrhea. Ask your health care provider if you are at risk.  If you do not have HIV, but are at risk, it may be recommended that you take a prescription medicine daily to  prevent HIV infection. This is called pre-exposure prophylaxis (PrEP). You are considered at risk if: ? You are sexually active and do not regularly use condoms or know the HIV status of your partner(s). ? You take drugs by injection. ? You are sexually active with a partner who has HIV.  Talk with your health care provider about whether you are at high risk of being infected with HIV. If you choose to begin PrEP, you should first be tested for HIV.  You should then be tested every 3 months for as long as you are taking PrEP. Pregnancy  If you are premenopausal and you may become pregnant, ask your health care provider about preconception counseling.  If you may become pregnant, take 400 to 800 micrograms (mcg) of folic acid every day.  If you want to prevent pregnancy, talk to your health care provider about birth control (contraception). Osteoporosis and menopause  Osteoporosis is a disease in which the bones lose minerals and strength with aging. This can result in serious bone fractures. Your risk for osteoporosis can be identified using a bone density scan.  If you are 12 years of age or older, or if you are at risk for osteoporosis and fractures, ask your health care provider if you should be screened.  Ask your health care provider whether you should take a calcium or vitamin D supplement to lower your risk for osteoporosis.  Menopause may have certain physical symptoms and risks.  Hormone replacement therapy may reduce some of these symptoms and risks. Talk to your health care provider about whether hormone replacement therapy is right for you. Follow these instructions at home:  Schedule regular health, dental, and eye exams.  Stay current with your immunizations.  Do not use any tobacco products including cigarettes, chewing tobacco, or electronic cigarettes.  If you are pregnant, do not drink alcohol.  If you are breastfeeding, limit how much and how often you drink  alcohol.  Limit alcohol intake to no more than 1 drink per day for nonpregnant women. One drink equals 12 ounces of beer, 5 ounces of wine, or 1 ounces of hard liquor.  Do not use street drugs.  Do not share needles.  Ask your health care provider for help if you need support or information about quitting drugs.  Tell your health care provider if you often feel depressed.  Tell your health care provider if you have ever been abused or do not feel safe at home. This information is not intended to replace advice given to you by your health care provider. Make sure you discuss any questions you have with your health care provider. Document Released: 04/24/2011 Document Revised: 03/16/2016 Document Reviewed: 07/13/2015 Elsevier Interactive Patient Education  [employer].

## 2018-04-26 ENCOUNTER — Telehealth: Payer: Self-pay | Admitting: Family Medicine

## 2018-04-26 ENCOUNTER — Other Ambulatory Visit: Payer: Self-pay

## 2018-04-26 DIAGNOSIS — R7989 Other specified abnormal findings of blood chemistry: Secondary | ICD-10-CM

## 2018-04-26 MED ORDER — VITAMIN D (ERGOCALCIFEROL) 1.25 MG (50000 UNIT) PO CAPS: 50000.0000 [IU] | ORAL_CAPSULE | ORAL | 0 refills | Status: DC

## 2018-04-26 NOTE — Progress Notes (Unsigned)
TSH has been ordered to have rechecked in one month.

## 2018-04-26 NOTE — Telephone Encounter (Signed)
Rec'd return call from pt. To receive lab results from 04/24/18.  Advised of result notes per Dr. Birdie Riddle:  Notes recorded by Midge Minium, MD on 04/26/2018 at 11:08 AM EDT Vit D is mildly low. Based on this, we need to start 50,000 units weekly x12 weeks in addition to daily OTC supplement of at least 2000 units.  TSH is mildly low. Based on this, we will change dosing to 1 tab daily (rather than 1.5 tabs one day/week). We need to repeat TSH at lab only visit in 1 month Remainder of labs look good!  Pt. verb. understanding of need to change Levothyroxine to 1 tab qd., and start Vita D 50,000 U weekly x 12 weeks, in addition to daily dose of 2000 units qd.  Scheduled one mo. repeat TSH on 05/28/18. (telephone note placed, since results were not forwarded to nurse triage [place])

## 2018-04-29 ENCOUNTER — Other Ambulatory Visit: Payer: Self-pay | Admitting: Family Medicine

## 2018-05-20 ENCOUNTER — Encounter: Payer: Self-pay | Admitting: Family Medicine

## 2018-05-20 ENCOUNTER — Ambulatory Visit: Payer: Medicare Other | Admitting: Family Medicine

## 2018-05-20 ENCOUNTER — Other Ambulatory Visit: Payer: Self-pay

## 2018-05-20 VITALS — BP 104/78 | HR 69 | Temp 98.3°F | Ht 63.0 in | Wt 207.8 lb

## 2018-05-20 DIAGNOSIS — L568 Other specified acute skin changes due to ultraviolet radiation: Secondary | ICD-10-CM

## 2018-05-20 MED ORDER — HYDROXYZINE HCL 25 MG PO TABS: 25.0000 mg | ORAL_TABLET | Freq: Three times a day (TID) | ORAL | 0 refills | Status: DC | PRN

## 2018-05-20 MED ORDER — TRIAMCINOLONE ACETONIDE 0.1 % EX CREA: 1.0000 "application " | TOPICAL_CREAM | Freq: Two times a day (BID) | CUTANEOUS | 0 refills | Status: DC

## 2018-05-20 NOTE — Progress Notes (Signed)
Subjective  CC:  Chief Complaint  Patient presents with  . Rash    Upper Arms and Ankles x 2 weeks, itchy and red    HPI: Kristine Olson is a 72 y.o. female who presents to the office today to address the problems listed above in the chief complaint.  Was at beach x 2 weeks. After 1st few days, had red itching rash on bilateral ankles. Did have feet in sand but no other exposures. Then last week, developed red itching rash on upper arms. Was sitting in sun for 3-4 hours prior to arm rash. No systemic affects. Feels fine otherwise. Admits to "sensitive skin"   Assessment  1. Photodermatitis due to sun      Plan   rash:  Possible related to sun/heat rash or other. Discussed possibilities. Treat with hydroxyzine and topical steroids. F/u if not improving.   Follow up: prn   No orders of the defined types were placed in this encounter.  Meds ordered this encounter  Medications  . hydrOXYzine (ATARAX/VISTARIL) 25 MG tablet    Sig: Take 1 tablet (25 mg total) by mouth 3 (three) times daily as needed.    Dispense:  30 tablet    Refill:  0  . triamcinolone cream (KENALOG) 0.1 %    Sig: Apply 1 application topically 2 (two) times daily. For 2 weeks, then as needed    Dispense:  45 g    Refill:  0      I reviewed the patients updated PMH, FH, and SocHx.    Patient Active Problem List   Diagnosis Date Noted  . Morbid obesity due to excess calories ([place]) 10/31/2017  . Anemia, iron deficiency 05/29/2016  . OA (osteoarthritis) of knee 05/22/2016  . Physical exam 04/17/2016  . Hemorrhoids 12/03/2015  . Elevated serum creatinine 11/27/2014  . OSA (obstructive sleep apnea) 03/28/2013  . HTN (hypertension) 03/06/2013  . Positional vertigo 10/18/2012  . Fasting hyperglycemia 08/23/2012  . Vitamin D deficiency 08/23/2012  . Osteopenia 07/06/2011  . POSTMENOPAUSAL SYNDROME 03/01/2010  . GERD (gastroesophageal reflux disease) 03/05/2009  . Hypothyroidism 11/20/2007  . IBS  11/20/2007  . TACHYCARDIA, HX OF 11/20/2007  . History of colonic polyps 11/20/2007   Current Meds  Medication Sig  . cetirizine (ZYRTEC) 10 MG tablet Take 10 mg by mouth daily as needed for allergies.  . cholecalciferol (VITAMIN D) 1000 units tablet Take 1,000 Units by mouth daily.  . citalopram (CELEXA) 20 MG tablet Take 20 mg by mouth at bedtime.   Marland Kitchen levothyroxine (SYNTHROID, LEVOTHROID) 112 MCG tablet TAKE 1 TABLET DAILY EXCEPT FOR 1 AND 1/2 TABLETS ON WEDNESDAY  . metoprolol tartrate (LOPRESSOR) 25 MG tablet TAKE 1/2 TABLET BY MOUTH TWICE A DAY (Patient taking differently: One tablet daily)  . ranitidine (ZANTAC) 300 MG tablet Take 1 tablet (300 mg total) by mouth at bedtime.  Marland Kitchen spironolactone (ALDACTONE) 25 MG tablet TAKE 1 TABLET (25 MG TOTAL) BY MOUTH DAILY.  Marland Kitchen Vitamin D, Ergocalciferol, (DRISDOL) 50000 units CAPS capsule Take 1 capsule (50,000 Units total) by mouth every 7 (seven) days.    Allergies: Patient is allergic to cefaclor; penicillins; sulfa antibiotics; and advair diskus [fluticasone-salmeterol]. Family History: Patient family history includes Alcohol abuse in her father; Bipolar disorder in her daughter; Cancer in her maternal grandmother; Deep vein thrombosis in her paternal aunt; Depression in her daughter; Diabetes in her maternal aunt; Heart attack in her maternal grandfather; Hypertension in her father; Liver disease in her father;  Melanoma in her brother; Multiple sclerosis (age of onset: 67) in her daughter; Osteoporosis in her mother; Other in her paternal aunt; Rheum arthritis in her mother; Stroke in her maternal aunt; Thyroid disease in her mother. Social History:  Patient  reports that she has never smoked. She has never used smokeless tobacco. She reports that she drinks alcohol. She reports that she does not use drugs.  Review of Systems: Constitutional: Negative for fever malaise or anorexia Cardiovascular: negative for chest pain Respiratory: negative  for SOB or persistent cough Gastrointestinal: negative for abdominal pain  Objective  Vitals: BP 104/78   Pulse 69   Temp 98.3 F (36.8 C)   Ht 5\' 3"  (1.6 m)   Wt 207 lb 12.8 oz (94.3 kg)   SpO2 96%   BMI 36.81 kg/m  General: no acute distress , A&Ox3, no respiratory distress Skin:  Warm,  Physical Exam  Skin: Skin is warm, dry and intact. Rash noted. No purpura noted. Rash is macular. Rash is not papular, not maculopapular, not nodular, not pustular, not vesicular and not urticarial. She is not diaphoretic. No pallor.          Commons side effects, risks, benefits, and alternatives for medications and treatment plan prescribed today were discussed, and the patient expressed understanding of the given instructions. Patient is instructed to call or message via MyChart if he/she has any questions or concerns regarding our treatment plan. No barriers to understanding were identified. We discussed Red Flag symptoms and signs in detail. Patient expressed understanding regarding what to do in case of urgent or emergency type symptoms.   Medication list was reconciled, printed and provided to the patient in AVS. Patient instructions and summary information was reviewed with the patient as documented in the AVS. This note was prepared with assistance of Dragon voice recognition software. Occasional wrong-word or sound-a-like substitutions may have occurred due to the inherent limitations of voice recognition software

## 2018-05-20 NOTE — Patient Instructions (Signed)
Please follow up if symptoms do not improve or as needed.   

## 2018-05-28 ENCOUNTER — Other Ambulatory Visit (INDEPENDENT_AMBULATORY_CARE_PROVIDER_SITE_OTHER): Payer: Medicare Other

## 2018-05-28 DIAGNOSIS — R7989 Other specified abnormal findings of blood chemistry: Secondary | ICD-10-CM | POA: Diagnosis not present

## 2018-05-28 LAB — TSH: TSH: 0.94 u[IU]/mL (ref 0.35–4.50)

## 2018-05-29 ENCOUNTER — Encounter: Payer: Self-pay | Admitting: General Practice

## 2018-06-16 ENCOUNTER — Other Ambulatory Visit: Payer: Self-pay | Admitting: Family Medicine

## 2018-07-17 ENCOUNTER — Other Ambulatory Visit: Payer: Self-pay | Admitting: Family Medicine

## 2018-07-19 ENCOUNTER — Telehealth: Payer: Self-pay | Admitting: Family Medicine

## 2018-07-19 MED ORDER — PANTOPRAZOLE SODIUM 20 MG PO TBEC: 20.0000 mg | DELAYED_RELEASE_TABLET | Freq: Every day | ORAL | 3 refills | Status: DC

## 2018-07-19 NOTE — Telephone Encounter (Signed)
Chart updated to reflect changes new medication filled to pharmacy. Pt informed.

## 2018-07-19 NOTE — Telephone Encounter (Signed)
Ok to switch to [employer] 20mg  daily (#30, 3 refills) and remove Ranitidine from med list

## 2018-07-19 NOTE — Telephone Encounter (Signed)
Copied from [place] [id number]. Topic: Quick Communication - Rx Refill/Question >> Jul 19, 2018  2:46 PM Burchel, Abbi R wrote: Medication:  ranitidine (ZANTAC) 300 MG tablet   Pt was concerned about cancer risks associated with this medication. Pt would also like to note that she is still having to take tums in addition to this medication unless she eats a very bland diet.  Pt would like to know if she should continue taking this medication since it does not seem to be working well for her.  Please call pt to advise.   [phone]

## 2018-10-28 ENCOUNTER — Encounter: Payer: Self-pay | Admitting: Family Medicine

## 2018-10-28 ENCOUNTER — Ambulatory Visit: Payer: Medicare Other | Admitting: Family Medicine

## 2018-10-28 ENCOUNTER — Other Ambulatory Visit: Payer: Self-pay

## 2018-10-28 VITALS — BP 120/78 | HR 71 | Temp 98.1°F | Resp 16 | Ht 63.0 in | Wt 217.4 lb

## 2018-10-28 DIAGNOSIS — M2041 Other hammer toe(s) (acquired), right foot: Secondary | ICD-10-CM

## 2018-10-28 DIAGNOSIS — E038 Other specified hypothyroidism: Secondary | ICD-10-CM | POA: Diagnosis not present

## 2018-10-28 DIAGNOSIS — I10 Essential (primary) hypertension: Secondary | ICD-10-CM | POA: Diagnosis not present

## 2018-10-28 LAB — CBC WITH DIFFERENTIAL/PLATELET
BASOS ABS: 0.1 10*3/uL (ref 0.0–0.1)
BASOS PCT: 0.8 % (ref 0.0–3.0)
EOS ABS: 0.2 10*3/uL (ref 0.0–0.7)
Eosinophils Relative: 2.3 % (ref 0.0–5.0)
HCT: 36.3 % (ref 36.0–46.0)
Hemoglobin: 11.8 g/dL — ABNORMAL LOW (ref 12.0–15.0)
LYMPHS PCT: 14.2 % (ref 12.0–46.0)
Lymphs Abs: 1.1 10*3/uL (ref 0.7–4.0)
MCHC: 32.6 g/dL (ref 30.0–36.0)
MCV: 83.5 fl (ref 78.0–100.0)
MONO ABS: 0.6 10*3/uL (ref 0.1–1.0)
Monocytes Relative: 7.6 % (ref 3.0–12.0)
Neutro Abs: 5.7 10*3/uL (ref 1.4–7.7)
Neutrophils Relative %: 75.1 % (ref 43.0–77.0)
Platelets: 208 10*3/uL (ref 150.0–400.0)
RBC: 4.35 Mil/uL (ref 3.87–5.11)
RDW: 15.1 % (ref 11.5–15.5)
WBC: 7.6 10*3/uL (ref 4.0–10.5)

## 2018-10-28 LAB — BASIC METABOLIC PANEL
BUN: 19 mg/dL (ref 6–23)
CALCIUM: 9.3 mg/dL (ref 8.4–10.5)
CHLORIDE: 103 meq/L (ref 96–112)
CO2: 27 mEq/L (ref 19–32)
CREATININE: 1.19 mg/dL (ref 0.40–1.20)
GFR: 47.61 mL/min — AB (ref >60.00)
Glucose, Bld: 96 mg/dL (ref 70–99)
Potassium: 4.2 mEq/L (ref 3.5–5.1)
Sodium: 138 mEq/L (ref 135–145)

## 2018-10-28 LAB — TSH: TSH: 2.93 u[IU]/mL (ref 0.35–4.50)

## 2018-10-28 NOTE — Assessment & Plan Note (Signed)
Chronic problem.  + fatigue but pt admits to not enough sleep.  Check labs.  Adjust meds prn

## 2018-10-28 NOTE — Assessment & Plan Note (Signed)
Chronic problem.  Well controlled.  Asymptomatic.  Check labs.  No anticipated med changes.  Will follow. 

## 2018-10-28 NOTE — Patient Instructions (Signed)
Schedule your complete physical in 6 months We'll notify you of your lab results and make any changes if needed Continue to work on healthy diet and regular exercise- you can do it!!! Re-focus on Weight Watchers- you can do it!! We'll call you with your podiatry appt Call with any questions or concerns Happy New Year!!!

## 2018-10-28 NOTE — Assessment & Plan Note (Signed)
Deteriorated.  Pt has gained another 9 lbs.  She is a member at 2 gyms and [employer].  Encouraged her to recommit to all of the above.  Will continue to follow closely.

## 2018-10-28 NOTE — Progress Notes (Signed)
   Subjective:    Patient ID: Kristine Olson, female    DOB: 09-20-1946, 73 y.o.   MRN: [id number]  HPI HTN- chronic problem, on Spironolactone 25mg  daily and Metoprolol 1/2 tab BID w/ good control.  No CP, SOB, HAs, visual changes, edema.  Hypothyroid- chronic problem, on Levothyroxine 125mcg daily w/ 1.5 tabs on Wednesday.  + fatigue upon waking- 'I don't get enough sleep'.  + weight gain.  Obesity- pt has gained 9 lbs since last visit.  No regular exercise.  No particular diet plan.  Hammer toe- R 2nd toe.  Worsening per pt report.  Painful to wear shoes   Review of Systems  For ROS see HPI     Objective:   Physical Exam Vitals signs reviewed.  Constitutional:      General: She is not in acute distress.    Appearance: She is well-developed. She is obese.  HENT:     Head: Normocephalic and atraumatic.  Eyes:     Conjunctiva/sclera: Conjunctivae normal.     Pupils: Pupils are equal, round, and reactive to light.  Neck:     Musculoskeletal: Normal range of motion and neck supple.     Thyroid: No thyromegaly.  Cardiovascular:     Rate and Rhythm: Normal rate and regular rhythm.     Heart sounds: Normal heart sounds. No murmur.  Pulmonary:     Effort: Pulmonary effort is normal. No respiratory distress.     Breath sounds: Normal breath sounds.  Abdominal:     General: There is no distension.     Palpations: Abdomen is soft.     Tenderness: There is no abdominal tenderness.  Lymphadenopathy:     Cervical: No cervical adenopathy.  Skin:    General: Skin is warm and dry.  Neurological:     Mental Status: She is alert and oriented to person, place, and time.  Psychiatric:        Behavior: Behavior normal.           Assessment & Plan:

## 2018-10-29 ENCOUNTER — Encounter: Payer: Self-pay | Admitting: General Practice

## 2018-11-24 ENCOUNTER — Other Ambulatory Visit: Payer: Self-pay | Admitting: Family Medicine

## 2018-12-03 ENCOUNTER — Ambulatory Visit: Payer: Medicare Other | Admitting: Podiatry

## 2018-12-03 ENCOUNTER — Ambulatory Visit (INDEPENDENT_AMBULATORY_CARE_PROVIDER_SITE_OTHER): Payer: Medicare Other

## 2018-12-03 ENCOUNTER — Encounter: Payer: Self-pay | Admitting: Podiatry

## 2018-12-03 VITALS — BP 118/75 | HR 66 | Resp 16

## 2018-12-03 DIAGNOSIS — M216X1 Other acquired deformities of right foot: Secondary | ICD-10-CM

## 2018-12-03 DIAGNOSIS — M2041 Other hammer toe(s) (acquired), right foot: Secondary | ICD-10-CM

## 2018-12-03 NOTE — Progress Notes (Signed)
Subjective:  Patient ID: Kristine Olson, female    DOB: [birth year],  MRN: [id number] HPI Chief Complaint  Patient presents with  . Toe Pain    2nd toe right - hammer toe deformity for years, but has gotten more painful the last couple months, rubs shoes, callused at the knuckle, bumped 2nd and 3rd toes couple months ago and has been more sensitive since  . New Patient (Initial Visit)    Est pt 2016    73 y.o. female presents with the above complaint.  ROS: Denies fever chills nausea vomiting muscle aches pains calf pain back pain chest pain shortness of breath and headache.  Past Medical History:  Diagnosis Date  . Acute pyloric channel ulcer with hemorrhage on Xarelto   . Anemia    history of anemia after bleeding ulcer  . Arthritis    ostearthritis - right  medial knee  . Complication of anesthesia    was given a medication while waiting to go to surgery that caused burning sensation on scalp was given another medication and the burning went away  . Depression   . GERD (gastroesophageal reflux disease)    "heartburn daily"-TUMS helpful  . History of blood transfusion    2 units PRBC  . Hx of sinus tachycardia    rarely any problems since metoprolol use  . Hyperlipidemia    patient unaware, has never taking medication  . Hypertension   . Hypothyroidism   . Sleep apnea    uses c pap  . Varicose vein of leg    Past Surgical History:  Procedure Laterality Date  . BIOPSY BREAST     2 biopsies & 2 aspirations  . CESAREAN SECTION    . COLONOSCOPY W/ POLYPECTOMY  multiple since 2007   adenomas, diverticulosis;last 2015  . ESOPHAGOGASTRODUODENOSCOPY (EGD) WITH PROPOFOL N/A 05/29/2016   Procedure: ESOPHAGOGASTRODUODENOSCOPY (EGD) WITH PROPOFOL;  Surgeon: Gatha Mayer, MD;  Location: [facility];  Service: Endoscopy;  Laterality: N/A;  . PARTIAL KNEE ARTHROPLASTY Right 05/22/2016   Procedure: RIGHT KNEE MEDIAL UNICOMPARTMENTAL ARTHROPLASTY;  Surgeon: Gaynelle Arabian, MD;   Location: [facility];  Service: Orthopedics;  Laterality: Right;  . TONSILLECTOMY AND ADENOIDECTOMY    . TOTAL KNEE ARTHROPLASTY Left 08/27/2017   Procedure: LEFT TOTAL KNEE ARTHROPLASTY;  Surgeon: Gaynelle Arabian, MD;  Location: [facility];  Service: Orthopedics;  Laterality: Left;  . WISDOM TOOTH EXTRACTION      Current Outpatient Medications:  .  cetirizine (ZYRTEC) 10 MG tablet, Take 10 mg by mouth daily as needed for allergies., Disp: , Rfl:  .  cholecalciferol (VITAMIN D) 1000 units tablet, Take 1,000 Units by mouth daily., Disp: , Rfl:  .  citalopram (CELEXA) 20 MG tablet, Take 20 mg by mouth at bedtime. , Disp: , Rfl:  .  FLUAD 0.5 ML SUSY, INJECT 0.5ML INTRAMUSCULARLY ONCE, Disp: , Rfl:  .  levothyroxine (SYNTHROID, LEVOTHROID) 112 MCG tablet, TAKE 1 TABLET DAILY EXCEPT FOR 1 AND 1/2 TABLETS ON WEDNESDAY, Disp: 97 tablet, Rfl: 1 .  metoprolol tartrate (LOPRESSOR) 25 MG tablet, Take 0.5 tablets (12.5 mg total) by mouth 2 (two) times daily., Disp: 90 tablet, Rfl: 1 .  pantoprazole (PROTONIX) 20 MG tablet, Take 1 tablet (20 mg total) by mouth daily., Disp: 30 tablet, Rfl: 3 .  spironolactone (ALDACTONE) 25 MG tablet, TAKE 1 TABLET (25 MG TOTAL) BY MOUTH DAILY., Disp: 90 tablet, Rfl: 1  Allergies  Allergen Reactions  . Cefaclor Rash    Because of a  history of documented adverse serious drug reaction;Medi Alert bracelet  is recommended  . Penicillins     REACTION: rash Because of a history of documented adverse serious drug reaction;Medi Alert bracelet  is recommended Has patient had a PCN reaction causing immediate rash, facial/tongue/throat swelling, SOB or lightheadedness with hypotension: no Has patient had a PCN reaction causing severe rash involving mucus membranes or skin necrosis: {no Has patient had a PCN reaction that required hospitalization no Has patient had a PCN reaction occurring within the last 10 years: no If all of the above ans  . Sulfa Antibiotics Rash    01/22/13 after 9  days of Sulfa Because of a history of documented adverse serious drug reaction;Medi Alert bracelet  is recommended  . Advair Diskus [Fluticasone-Salmeterol]     "Felt weird" and flushing.   Review of Systems Objective:   Vitals:   12/03/18 0957  BP: 118/75  Pulse: 66  Resp: 16    General: Well developed, nourished, in no acute distress, alert and oriented x3   Dermatological: Skin is warm, dry and supple bilateral. Nails x 10 are well maintained; remaining integument appears unremarkable at this time. There are no open sores, no preulcerative lesions, no rash or signs of infection present.  Vascular: Dorsalis Pedis artery and Posterior Tibial artery pedal pulses are 2/4 bilateral with immedate capillary fill time. Pedal hair growth present. No varicosities and no lower extremity edema present bilateral.   Neruologic: Grossly intact via light touch bilateral. Vibratory intact via tuning fork bilateral. Protective threshold with Semmes Wienstein monofilament intact to all pedal sites bilateral. Patellar and Achilles deep tendon reflexes 2+ bilateral. No Babinski or clonus noted bilateral.   Musculoskeletal: No gross boney pedal deformities bilateral. No pain, crepitus, or limitation noted with foot and ankle range of motion bilateral. Muscular strength 5/5 in all groups tested bilateral.  Rigid hammertoe deformity second right with medial deviation of the hallux contracted second metatarsophalangeal joint right with plantarflexed second metatarsal right hammertoe deformity third digit right less rigid.  Gait: Unassisted, Nonantalgic.    Radiographs:  Radiographs taken today demonstrate an osseously mature individual with elongated second metatarsal right foot hammertoe deformity second digit right foot with what appears to be arthritic osteoarthritic process PIPJ she also has osteoarthritic process DIPJ third digit right foot with medial deviation.  Assessment & Plan:   Assessment:  Hammertoe deformity and contracted second metatarsal phalangeal joint right foot hammertoe deformity third digit right foot  Plan: Discussed etiology pathology conservative versus surgical therapies.  At this point she would like to undergo surgical correction of the toe.  She understands that there is no other alternatives for this.  We consented her today for second metatarsal osteotomy hammertoe repair with screw or pin.  And hammertoe repair #3 with screw or pin.  She understands the ramifications and the possible side effects of this procedure.  We did discussed in great detail today which may include but not limited to postop pain bleeding swelling infection recurrence need further surgery overcorrection under correction also digit loss of limb loss of life.  We dispensed a Cam walker as well as both oral and written home-going instructions for the morning of surgery as well as the anesthesia group and the surgery center.  Follow-up with her in the near future.     Lennox Dolberry T. [place], [place]

## 2018-12-03 NOTE — Patient Instructions (Signed)
Pre-Operative Instructions  Congratulations, you have decided to take an important step towards improving your quality of life.  You can be assured that the doctors and staff at [facility] will be with you every step of the way.  Here are some important things you should know:  1. Plan to be at the surgery center/hospital at least 1 (one) hour prior to your scheduled time, unless otherwise directed by the surgical center/hospital staff.  You must have a responsible adult accompany you, remain during the surgery and drive you home.  Make sure you have directions to the surgical center/hospital to ensure you arrive on time. 2. If you are having surgery at [facility] or [facility] hospitals, you will need a copy of your medical history and physical form from your family physician within one month prior to the date of surgery. We will give you a form for your primary physician to complete.  3. We make every effort to accommodate the date you request for surgery.  However, there are times where surgery dates or times have to be moved.  We will contact you as soon as possible if a change in schedule is required.   4. No aspirin/ibuprofen for one week before surgery.  If you are on aspirin, any non-steroidal anti-inflammatory medications (Mobic, Aleve, Ibuprofen) should not be taken seven (7) days prior to your surgery.  You make take Tylenol for pain prior to surgery.  5. Medications - If you are taking daily heart and blood pressure medications, seizure, reflux, allergy, asthma, anxiety, pain or diabetes medications, make sure you notify the surgery center/hospital before the day of surgery so they can tell you which medications you should take or avoid the day of surgery. 6. No food or drink after midnight the night before surgery unless directed otherwise by surgical center/hospital staff. 7. No alcoholic beverages 24-hours prior to surgery.  No smoking 24-hours prior or 24-hours after  surgery. 8. Wear loose pants or shorts. They should be loose enough to fit over bandages, boots, and casts. 9. Don't wear slip-on shoes. Sneakers are preferred. 10. Bring your boot with you to the surgery center/hospital.  Also bring crutches or a walker if your physician has prescribed it for you.  If you do not have this equipment, it will be provided for you after surgery. 11. If you have not been contacted by the surgery center/hospital by the day before your surgery, call to confirm the date and time of your surgery. 12. Leave-time from work may vary depending on the type of surgery you have.  Appropriate arrangements should be made prior to surgery with your employer. 13. Prescriptions will be provided immediately following surgery by your doctor.  Fill these as soon as possible after surgery and take the medication as directed. Pain medications will not be refilled on weekends and must be approved by the doctor. 14. Remove nail polish on the operative foot and avoid getting pedicures prior to surgery. 15. Wash the night before surgery.  The night before surgery wash the foot and leg well with water and the antibacterial soap provided. Be sure to pay special attention to beneath the toenails and in between the toes.  Wash for at least three (3) minutes. Rinse thoroughly with water and dry well with a towel.  Perform this wash unless told not to do so by your physician.  Enclosed: 1 Ice pack (please put in freezer the night before surgery)   1 Hibiclens skin cleaner     Pre-op instructions  If you have any questions regarding the instructions, please do not hesitate to call our office.  [place]: [address], [place], [place] [address] -- [phone]  [place]: [address] [place], [place] [address] -- [phone]  [place]: [address]  [place], [place] [address] -- [phone]  [place]: [address], [place], [place] [address] -- [phone]  Website:  [email or web address] 

## 2018-12-10 ENCOUNTER — Other Ambulatory Visit: Payer: Self-pay | Admitting: Family Medicine

## 2018-12-11 ENCOUNTER — Telehealth: Payer: Self-pay | Admitting: *Deleted

## 2018-12-11 NOTE — Telephone Encounter (Signed)
"  I'd like to schedule my surgery with Dr. Milinda Pointer."  Dr. Milinda Pointer does surgeries on Fridays.  Do you have a specific date in mind?  "I'd like to do it May 29 or June 5."  Dr. Milinda Pointer can do it on June 5.  "Okay great, sounds good."  Someone from the surgical center will call you a day or two prior to your surgery date.  They will give you your arrival time.  You need to go online and register with the surgical center, instructions are in the brochure that we gave you.

## 2018-12-16 ENCOUNTER — Telehealth: Payer: Self-pay | Admitting: *Deleted

## 2018-12-16 NOTE — Telephone Encounter (Signed)
"  I was told to register on line with the surgical center.  I was told the instructions are on a dark blue page.  I do not have a dark blue page and I was not given a form for medical clearance."  The dark blue page is found in the brochure that we gave you.  "I don't have a brochure, all I have is a sheet that they gave me after my last visit."  If you do not have one, I can mail it to you.  It says [facility].  "Oh, I have it and I see what you are saying.  I still don't have the medical clearance form.  Oh, it says if you or having surgery at [facility] or [facility] clearance is needed."  You're not having surgery at either location.  "Okay, I see.  Thanks for answering my questions."

## 2019-01-20 ENCOUNTER — Other Ambulatory Visit: Payer: Self-pay | Admitting: Family Medicine

## 2019-01-27 ENCOUNTER — Other Ambulatory Visit: Payer: Self-pay | Admitting: Family Medicine

## 2019-03-13 ENCOUNTER — Telehealth: Payer: Self-pay | Admitting: *Deleted

## 2019-03-13 NOTE — Telephone Encounter (Signed)
"  I'm calling to see if I'm still scheduled for surgery on June 5 due to all that's going on with this Covid."  Yes, your surgery is still on for March 28, 2019.  "How safe is it for me to have this surgery?"  You will be safe.  The surgical centers started allowing cases about two weeks ago.  They are taking extra precautions for your safety.  "I received a text from [place].  Is that you all?"  That is the surgical center that we use for our surgeries.  "So I should respond to the text?"  Yes, you should respond to the text.  We gave you a surgical kit when you were here last.  You should have a brochure from the surgery center in your bag.  In the brochure is instructions on how to go online and register with the surgery center.  "Okay, I'll take care of that."  Someone from the surgical center will call you a day or two prior to your surgery date and they will give you your arrival time.

## 2019-03-14 ENCOUNTER — Telehealth: Payer: Self-pay | Admitting: *Deleted

## 2019-03-14 NOTE — Telephone Encounter (Signed)
DOS 03/28/2019, CPT CODES: 50757 METATARSAL OSTEOTOMY 2ND AND 32256 HAMMER TOE REPAIR 2,3 RIGHT FOOT  HCS:[id number] Date  10/23/2018 Termination Date 10/23/2019   Notification or Prior Authorization is not required for the requested services  This [facility] Advantage members plan does not currently require a prior authorization for these services. If you have general questions about the prior authorization requirements, please call us at [phone] or visit [email or web address] > Clinician Resources > Advance and Admission Notification Requirements. The number above acknowledges your notification. Please write this number down for future reference. Notification is not a guarantee of coverage or payment.  Decision ID #:[id number]

## 2019-03-26 ENCOUNTER — Other Ambulatory Visit: Payer: Self-pay | Admitting: Podiatry

## 2019-03-26 MED ORDER — OXYCODONE-ACETAMINOPHEN 10-325 MG PO TABS: 1.0000 | ORAL_TABLET | Freq: Four times a day (QID) | ORAL | 0 refills | Status: AC | PRN

## 2019-03-26 MED ORDER — ONDANSETRON HCL 4 MG PO TABS: 4.0000 mg | ORAL_TABLET | Freq: Three times a day (TID) | ORAL | 0 refills | Status: DC | PRN

## 2019-03-26 MED ORDER — CLINDAMYCIN HCL 150 MG PO CAPS: 150.0000 mg | ORAL_CAPSULE | Freq: Three times a day (TID) | ORAL | 0 refills | Status: DC

## 2019-03-28 DIAGNOSIS — M2041 Other hammer toe(s) (acquired), right foot: Secondary | ICD-10-CM | POA: Diagnosis not present

## 2019-03-28 DIAGNOSIS — M21541 Acquired clubfoot, right foot: Secondary | ICD-10-CM | POA: Diagnosis not present

## 2019-04-03 ENCOUNTER — Encounter: Payer: Self-pay | Admitting: Podiatry

## 2019-04-03 ENCOUNTER — Ambulatory Visit (INDEPENDENT_AMBULATORY_CARE_PROVIDER_SITE_OTHER): Payer: Medicare Other

## 2019-04-03 ENCOUNTER — Other Ambulatory Visit: Payer: Self-pay

## 2019-04-03 ENCOUNTER — Ambulatory Visit (INDEPENDENT_AMBULATORY_CARE_PROVIDER_SITE_OTHER): Payer: Medicare Other | Admitting: Podiatry

## 2019-04-03 VITALS — Temp 97.8°F

## 2019-04-03 DIAGNOSIS — M216X1 Other acquired deformities of right foot: Secondary | ICD-10-CM | POA: Diagnosis not present

## 2019-04-03 DIAGNOSIS — M2041 Other hammer toe(s) (acquired), right foot: Secondary | ICD-10-CM

## 2019-04-03 DIAGNOSIS — Z09 Encounter for follow-up examination after completed treatment for conditions other than malignant neoplasm: Secondary | ICD-10-CM

## 2019-04-03 NOTE — Progress Notes (Signed)
She presents today 1 week status post hammertoe repair second and third right and a second metatarsal osteotomy right.  She denies fever chills nausea vomiting muscle aches pain states she is doing pretty well.  Objective: Vital signs are stable she is alert and oriented x3.  K wires are in good position sutures are retained margins are coapting minimal edema no erythema cellulitis drainage or odor.  Radiographs taken today demonstrate well-healing well-placed K wires and screws to the second metatarsal second toe and third toe.  Plan: Discussed etiology pathology conservative surgical therapies.  Redressed her today dressed a compressive dressing encourage her to continue to use the cam walker sleep with this to help protect this K wires I will follow-up with her in 1 to 2 weeks hopefully for suture removal.

## 2019-04-10 ENCOUNTER — Other Ambulatory Visit: Payer: Self-pay

## 2019-04-10 ENCOUNTER — Ambulatory Visit (INDEPENDENT_AMBULATORY_CARE_PROVIDER_SITE_OTHER): Payer: Self-pay | Admitting: Podiatry

## 2019-04-10 ENCOUNTER — Encounter: Payer: Self-pay | Admitting: Podiatry

## 2019-04-10 DIAGNOSIS — Z09 Encounter for follow-up examination after completed treatment for conditions other than malignant neoplasm: Secondary | ICD-10-CM

## 2019-04-10 NOTE — Progress Notes (Signed)
She presents today date of surgery 03/28/2019 status post metatarsal osteotomy of the right foot.  Hammertoe repair #2 and 3 of the right foot.  States that my foot feels fine this week and only taking Tylenol at night.  Objective: Vital signs are stable alert and oriented x3.  Pulses are palpable.  There is no erythema there is some mild edema no cellulitis drainage or odor K wires are intact margins are coapted sutures are in place she does have a small amount of undermining of the epidermis with blood so would not want take the sutures out quite yet probably wait another week will notice.  Assessment: Well-healing surgical foot.  Plan: Redressed today dressed a compressive dressing remove sutures next week.

## 2019-04-17 ENCOUNTER — Ambulatory Visit (INDEPENDENT_AMBULATORY_CARE_PROVIDER_SITE_OTHER): Payer: Medicare Other | Admitting: Podiatry

## 2019-04-17 ENCOUNTER — Encounter: Payer: Self-pay | Admitting: Podiatry

## 2019-04-17 ENCOUNTER — Other Ambulatory Visit: Payer: Self-pay

## 2019-04-17 ENCOUNTER — Ambulatory Visit (INDEPENDENT_AMBULATORY_CARE_PROVIDER_SITE_OTHER): Payer: Medicare Other

## 2019-04-17 DIAGNOSIS — M216X1 Other acquired deformities of right foot: Secondary | ICD-10-CM

## 2019-04-17 DIAGNOSIS — Z09 Encounter for follow-up examination after completed treatment for conditions other than malignant neoplasm: Secondary | ICD-10-CM

## 2019-04-17 DIAGNOSIS — M2041 Other hammer toe(s) (acquired), right foot: Secondary | ICD-10-CM | POA: Diagnosis not present

## 2019-04-17 NOTE — Progress Notes (Signed)
She presents today date of surgery 03/28/2019 status post second metatarsal osteotomy hammertoe repair #2 #3 with K wires.  She states my foot is doing pretty good starting to swell a little bit but I am not keeping it up as much as I used to.  Objective: Vital signs are stable alert oriented x3 margins appear to be well coapted there is minimal edema no erythema cellulitis drainage odor not warm to the touch.  K wires are in place.  Assessment: Well-healing surgical foot.  Plan: Sutures were removed today margins remain well coapted K wires are in place.  Encouraged her to continue use of the cam walker as opposed to a Darco shoe.  I will follow-up with her in about 3 weeks for removal of the K wires.

## 2019-04-24 ENCOUNTER — Other Ambulatory Visit: Payer: Medicare Other

## 2019-04-24 ENCOUNTER — Other Ambulatory Visit: Payer: Self-pay | Admitting: Family Medicine

## 2019-05-08 ENCOUNTER — Ambulatory Visit (INDEPENDENT_AMBULATORY_CARE_PROVIDER_SITE_OTHER): Payer: Medicare Other

## 2019-05-08 ENCOUNTER — Ambulatory Visit (INDEPENDENT_AMBULATORY_CARE_PROVIDER_SITE_OTHER): Payer: Medicare Other | Admitting: Podiatry

## 2019-05-08 ENCOUNTER — Other Ambulatory Visit: Payer: Self-pay

## 2019-05-08 ENCOUNTER — Encounter: Payer: Self-pay | Admitting: Podiatry

## 2019-05-08 VITALS — Temp 98.2°F

## 2019-05-08 DIAGNOSIS — Z09 Encounter for follow-up examination after completed treatment for conditions other than malignant neoplasm: Secondary | ICD-10-CM

## 2019-05-08 DIAGNOSIS — M216X1 Other acquired deformities of right foot: Secondary | ICD-10-CM

## 2019-05-08 DIAGNOSIS — M2041 Other hammer toe(s) (acquired), right foot: Secondary | ICD-10-CM

## 2019-05-09 ENCOUNTER — Telehealth: Payer: Self-pay | Admitting: Podiatry

## 2019-05-09 NOTE — Telephone Encounter (Signed)
Pt states she did not sleep in the compression sock but woke with swelling at the ankle, and she spoke to a family member who is a physical therapist and was told that was not unusual. I told pt that the foot was trying to get use to having surgical swelling and day-to-day swelling and it was not unusual and to put the compression hose on before getting out of bed and wash prior to going to bed.

## 2019-05-09 NOTE — Telephone Encounter (Signed)
Saw Dr. Milinda Pointer yesterday and was moved from sx boot to shoe with compression hose. Has questions about swelling and bruising as well as questions about the compression hose.

## 2019-05-10 NOTE — Progress Notes (Signed)
She presents today date of surgery 03/28/2019 metatarsal osteotomy second right hammertoe repair second and third right I am ready to get this boot off and these pins out.  She denies fever chills nausea vomiting muscle aches and pains.  Objective: Vital signs are stable she is alert and oriented x3.  There is no erythema edema cellulitis drainage odor the foot appears to be in good position.  There is some retrograding of the pin to the second toe.  Pins were removed from the toes today toes remained rectus she has great range of motion of the second metatarsal phalangeal joint.  Assessment: Well-healing surgical foot right.  Plan: Pins were removed today provided her with a Darco shoe demonstrated how to wrap the toes regularly with Coban.  I will follow-up with her in 1 month

## 2019-05-16 ENCOUNTER — Other Ambulatory Visit: Payer: Self-pay | Admitting: Family Medicine

## 2019-05-22 ENCOUNTER — Encounter: Payer: Self-pay | Admitting: Podiatry

## 2019-05-22 ENCOUNTER — Ambulatory Visit (INDEPENDENT_AMBULATORY_CARE_PROVIDER_SITE_OTHER): Payer: Medicare Other

## 2019-05-22 ENCOUNTER — Ambulatory Visit (INDEPENDENT_AMBULATORY_CARE_PROVIDER_SITE_OTHER): Payer: Self-pay | Admitting: Podiatry

## 2019-05-22 ENCOUNTER — Other Ambulatory Visit: Payer: Self-pay

## 2019-05-22 VITALS — Temp 98.4°F

## 2019-05-22 DIAGNOSIS — M2041 Other hammer toe(s) (acquired), right foot: Secondary | ICD-10-CM | POA: Diagnosis not present

## 2019-05-22 DIAGNOSIS — M216X1 Other acquired deformities of right foot: Secondary | ICD-10-CM

## 2019-05-22 DIAGNOSIS — Z09 Encounter for follow-up examination after completed treatment for conditions other than malignant neoplasm: Secondary | ICD-10-CM

## 2019-05-22 NOTE — Progress Notes (Signed)
She presents today nearly 2 months status post hammertoe repair #2 #3 of the right foot.  Date of surgery June 5 2022nd metatarsal osteotomy with hammertoe repair with screw second toe right and third digit right with a K wire.  She states that it really does not look too good with all dry scaly skin on it but otherwise it feels pretty good.  Objective: Vital signs are stable she is alert and oriented x3.  Pulses are palpable.  There is no erythema to some mild edema no cellulitis drainage or odor great range of motion of the metatarsal phalangeal joints no pain here.  Radiographs demonstrate a well-healing osteotomy and internal fixation to the second toe with a good reduction of the third toe as well.  Assessment: Well-healing surgical foot.  Plan: Placed her in a regular shoe today and I will follow-up with her in 1 month for another set of x-rays.  I will have her to continue to dress the toes daily but leave them open at bedtime.

## 2019-05-23 ENCOUNTER — Encounter: Payer: Self-pay | Admitting: Podiatry

## 2019-06-02 ENCOUNTER — Other Ambulatory Visit: Payer: Self-pay | Admitting: Family Medicine

## 2019-06-24 ENCOUNTER — Other Ambulatory Visit: Payer: Self-pay

## 2019-06-24 ENCOUNTER — Ambulatory Visit (INDEPENDENT_AMBULATORY_CARE_PROVIDER_SITE_OTHER): Payer: Medicare Other

## 2019-06-24 ENCOUNTER — Encounter: Payer: Self-pay | Admitting: Podiatry

## 2019-06-24 ENCOUNTER — Ambulatory Visit (INDEPENDENT_AMBULATORY_CARE_PROVIDER_SITE_OTHER): Payer: Self-pay | Admitting: Podiatry

## 2019-06-24 DIAGNOSIS — M2041 Other hammer toe(s) (acquired), right foot: Secondary | ICD-10-CM | POA: Diagnosis not present

## 2019-06-24 DIAGNOSIS — M216X1 Other acquired deformities of right foot: Secondary | ICD-10-CM | POA: Diagnosis not present

## 2019-06-24 DIAGNOSIS — Z09 Encounter for follow-up examination after completed treatment for conditions other than malignant neoplasm: Secondary | ICD-10-CM

## 2019-06-24 NOTE — Progress Notes (Signed)
She presents today for follow-up of her second metatarsal osteotomy right hammertoe repair with screw right and fusion of hammertoe with pin third right date of surgery 03/28/2019 she states that still have swelling in left foot and I cannot wear normal shoes yet I am worried about going over the top of my toes they might break off.  She continues to wear the surgical shoe around the house.  She states that she has no swelling when she wears a compression hose she states when she stands up her second toe does not hit the ground.  Objective: Vital signs are stable she is alert and oriented x3 pulses are palpable.  She still has some swelling beneath the second metatarsal phalangeal joint but the toe is slightly dorsiflexed with weightbearing secondary to scar contracture at the MTPJ.  Otherwise the toe is much better than it was previously.  Third toe is rectus with no internal fixation in good position.  And does approximate the ground.  Radiographs taken today demonstrate well-healing site metatarsal osteotomy dual screw fixation hammertoe repair second with screw fixation and third toe.  She appears to be healing very nicely.  Assessment: Well-healing surgical foot.  Plan: I expressed to her today that she basically needs to do some massage therapy to the scars and surrounding soft tissue she understands and is amenable to it spreads to her that she needs to try to get into some regular shoe gear even if it is somewhat sore.  She understands that is amenable to it we will follow-up with me on an as-needed basis or in 3 months.

## 2019-07-07 ENCOUNTER — Ambulatory Visit (INDEPENDENT_AMBULATORY_CARE_PROVIDER_SITE_OTHER): Payer: Medicare Other | Admitting: Family Medicine

## 2019-07-07 ENCOUNTER — Other Ambulatory Visit: Payer: Self-pay

## 2019-07-07 ENCOUNTER — Encounter: Payer: Self-pay | Admitting: Family Medicine

## 2019-07-07 VITALS — BP 134/80 | HR 72 | Temp 98.5°F | Resp 15 | Wt 214.2 lb

## 2019-07-07 DIAGNOSIS — W57XXXA Bitten or stung by nonvenomous insect and other nonvenomous arthropods, initial encounter: Secondary | ICD-10-CM | POA: Diagnosis not present

## 2019-07-07 DIAGNOSIS — Z23 Encounter for immunization: Secondary | ICD-10-CM | POA: Diagnosis not present

## 2019-07-07 DIAGNOSIS — S80862A Insect bite (nonvenomous), left lower leg, initial encounter: Secondary | ICD-10-CM | POA: Diagnosis not present

## 2019-07-07 MED ORDER — DOXYCYCLINE HYCLATE 100 MG PO TABS: 100.0000 mg | ORAL_TABLET | Freq: Two times a day (BID) | ORAL | 0 refills | Status: DC

## 2019-07-07 NOTE — Progress Notes (Signed)
   Subjective:    Patient ID: Kristine Olson, female    DOB: [birth year], 73 y.o.   MRN: [id number]  HPI Tick bite- pt got tick bite 2 weeks ago, tick was engorged.  No headaches, fevers, rashes, malaise.  Bite was initially small behind L knee and over last 3 days has become more red and irritated.  Not painful.  Not draining.   Review of Systems For ROS see HPI     Objective:   Physical Exam Vitals signs reviewed.  Constitutional:      General: She is not in acute distress.    Appearance: Normal appearance. She is not ill-appearing.  HENT:     Head: Normocephalic and atraumatic.  Skin:    General: Skin is warm and dry.     Findings: Lesion (0.5cm red, indurated bite site behind L knee) present. No rash.  Neurological:     General: No focal deficit present.     Mental Status: She is alert and oriented to person, place, and time.  Psychiatric:        Mood and Affect: Mood normal.        Behavior: Behavior normal.        Thought Content: Thought content normal.           Assessment & Plan:  Tick bite- new.  Early signs of infxn present.  Will start Doxy given possibility of tick borne illness and multiple abx allergies.  Reviewed supportive care and red flags that should prompt return.  Pt expressed understanding and is in agreement w/ plan.

## 2019-07-07 NOTE — Patient Instructions (Signed)
Follow up as needed or as scheduled START the Doxycycline twice daily- take w/ food Apply antibiotic ointment to the bite site Call with any questions or concerns Hang in there!!!

## 2019-08-17 ENCOUNTER — Other Ambulatory Visit: Payer: Self-pay | Admitting: Family Medicine

## 2019-08-24 ENCOUNTER — Encounter: Payer: Self-pay | Admitting: Internal Medicine

## 2019-09-23 ENCOUNTER — Other Ambulatory Visit: Payer: Self-pay | Admitting: Family Medicine

## 2019-09-23 ENCOUNTER — Encounter: Payer: Medicare Other | Admitting: Podiatry

## 2019-10-29 DIAGNOSIS — F411 Generalized anxiety disorder: Secondary | ICD-10-CM | POA: Diagnosis not present

## 2019-11-05 ENCOUNTER — Encounter: Payer: Self-pay | Admitting: Family Medicine

## 2019-12-03 DIAGNOSIS — F411 Generalized anxiety disorder: Secondary | ICD-10-CM | POA: Diagnosis not present

## 2019-12-25 ENCOUNTER — Other Ambulatory Visit: Payer: Self-pay

## 2019-12-25 ENCOUNTER — Encounter: Payer: Self-pay | Admitting: Family Medicine

## 2019-12-25 ENCOUNTER — Ambulatory Visit (INDEPENDENT_AMBULATORY_CARE_PROVIDER_SITE_OTHER): Payer: Medicare PPO | Admitting: Family Medicine

## 2019-12-25 DIAGNOSIS — M545 Low back pain, unspecified: Secondary | ICD-10-CM

## 2019-12-25 DIAGNOSIS — M2170 Unequal limb length (acquired), unspecified site: Secondary | ICD-10-CM

## 2019-12-25 DIAGNOSIS — M25552 Pain in left hip: Secondary | ICD-10-CM

## 2019-12-25 MED ORDER — TIZANIDINE HCL 4 MG PO TABS: 4.0000 mg | ORAL_TABLET | Freq: Every day | ORAL | 0 refills | Status: DC

## 2019-12-25 MED ORDER — PREDNISONE 10 MG PO TABS: ORAL_TABLET | ORAL | 0 refills | Status: DC

## 2019-12-25 NOTE — Progress Notes (Signed)
I have discussed the procedure for the virtual visit with the patient who has given consent to proceed with assessment and treatment.   Pt unable to obtain vitals.   Jessica L Brodmerkel, CMA     

## 2019-12-25 NOTE — Progress Notes (Signed)
Virtual Visit via Video   I connected with patient on 12/25/19 at  4:00 PM EST by a video enabled telemedicine application and verified that I am speaking with the correct person using two identifiers.  Location patient: Home Location provider: [facility]up[facility]ist, Office Persons participating in the virtual visit: Patient, Provider, [place] (Jess B)  I discussed the limitations of evaluation and management by telemedicine and the availability of in person appointments. The patient expressed understanding and agreed to proceed.  Subjective:   HPI:   Back pain- sxs started at her L hip 'a couple of weeks ago, like you bumped something really hard and have a bruise'.  Pain is now in lower back, 'both sides'.  Pt has increased her water intake over the last 3 days.  No dysuria, no blood.  Pt notes that when she hems pants, 'I have to hem 1 leg an inch and a half shorter'.  No radiation of back pain.  L hip pain is improving but not resolved.  Hip pain is lateral, not in groin, 'feels deep'.  Pain improves w/ rest.  Pain worsens w/ standing.  Has AM stiffness and has pain w/ turning in bed- particularly in the hip.  Has not tried OTC meds.  ROS:   See pertinent positives and negatives per HPI.  Patient Active Problem List   Diagnosis Date Noted  . Morbid obesity due to excess calories ([place]) 10/31/2017  . Anemia, iron deficiency 05/29/2016  . OA (osteoarthritis) of knee 05/22/2016  . Physical exam 04/17/2016  . Hemorrhoids 12/03/2015  . Elevated serum creatinine 11/27/2014  . OSA (obstructive sleep apnea) 03/28/2013  . HTN (hypertension) 03/06/2013  . Positional vertigo 10/18/2012  . Fasting hyperglycemia 08/23/2012  . Vitamin D deficiency 08/23/2012  . Osteopenia 07/06/2011  . POSTMENOPAUSAL SYNDROME 03/01/2010  . GERD (gastroesophageal reflux disease) 03/05/2009  . Hypothyroidism 11/20/2007  . IBS 11/20/2007  . TACHYCARDIA, HX OF 11/20/2007  . History of colonic polyps 11/20/2007     Social History   Tobacco Use  . Smoking status: Never Smoker  . Smokeless tobacco: Never Used  Substance Use Topics  . Alcohol use: Yes    Alcohol/week: 0.0 standard drinks    Comment:  very rarely    Current Outpatient Medications:  .  cetirizine (ZYRTEC) 10 MG tablet, Take 10 mg by mouth daily as needed for allergies., Disp: , Rfl:  .  cholecalciferol (VITAMIN D) 1000 units tablet, Take 1,000 Units by mouth daily., Disp: , Rfl:  .  citalopram (CELEXA) 20 MG tablet, , Disp: , Rfl:  .  levothyroxine (SYNTHROID) 112 MCG tablet, TAKE 1 TABLET DAILY EXCEPT FOR 1 AND 1/2 TABLETS ON WEDNESDAY, Disp: 97 tablet, Rfl: 1 .  metoprolol tartrate (LOPRESSOR) 25 MG tablet, TAKE 0.5 TABLETS (12.5 MG TOTAL) BY MOUTH 2 (TWO) TIMES DAILY., Disp: 90 tablet, Rfl: 1 .  pantoprazole (PROTONIX) 20 MG tablet, TAKE 1 TABLET BY MOUTH EVERY DAY, Disp: 90 tablet, Rfl: 1 .  spironolactone (ALDACTONE) 25 MG tablet, TAKE 1 TABLET BY MOUTH EVERY DAY, Disp: 90 tablet, Rfl: 1  Allergies  Allergen Reactions  . Cefaclor Rash    Because of a history of documented adverse serious drug reaction;Medi Alert bracelet  is recommended  . Penicillins     REACTION: rash Because of a history of documented adverse serious drug reaction;Medi Alert bracelet  is recommended Has patient had a PCN reaction causing immediate rash, facial/tongue/throat swelling, SOB or lightheadedness with hypotension: no Has patient had  a PCN reaction causing severe rash involving mucus membranes or skin necrosis: {no Has patient had a PCN reaction that required hospitalization no Has patient had a PCN reaction occurring within the last 10 years: no If all of the above ans  . Sulfa Antibiotics Rash    01/22/13 after 9 days of Sulfa Because of a history of documented adverse serious drug reaction;Medi Alert bracelet  is recommended  . Advair Diskus [Fluticasone-Salmeterol]     "Felt weird" and flushing.    Objective:   There were no vitals  taken for this visit.  AAOx3, NAD NCAT, EOMI No obvious CN deficits Coloring WNL Pt is able to speak clearly, coherently without shortness of breath or increased work of breathing.  Thought process is linear.  Mood is appropriate.   Assessment and Plan:   L hip pain- new.  Lateral hip pain.  Hurts to roll over in bed.  Somewhat better today but not gone.  Based on pt report, she has a significant leg length inequality which could be contributing to both her back and hip pain.  Start prednisone taper and muscle relaxer at night.  If no improvement, will need ortho referral.  Pt expressed understanding and is in agreement w/ plan.   Bilateral low back pain- new.  Pt reports bilateral LBP w/o radiation.  No red flags on hx.  Pain is worse w/ prolonged standing.  sxs are consistent w/ muscle spasm which again may be related to the leg length inequality.  If no improvement w/ steroids and muscle relaxers, will refer.  Pt expressed understanding and is in agreement w/ plan.    Annye Asa, MD 12/25/2019

## 2020-01-14 DIAGNOSIS — F411 Generalized anxiety disorder: Secondary | ICD-10-CM | POA: Diagnosis not present

## 2020-01-16 ENCOUNTER — Other Ambulatory Visit: Payer: Self-pay | Admitting: Family Medicine

## 2020-01-26 ENCOUNTER — Other Ambulatory Visit: Payer: Self-pay | Admitting: Family Medicine

## 2020-01-26 NOTE — Telephone Encounter (Signed)
Please advise, this was requested for #[phone]

## 2020-02-03 ENCOUNTER — Encounter: Payer: Self-pay | Admitting: Internal Medicine

## 2020-02-12 ENCOUNTER — Telehealth: Payer: Self-pay | Admitting: Family Medicine

## 2020-02-12 NOTE — Telephone Encounter (Signed)
Spoke with patient.  She was driving and would like for me to call her back on tomorrow.

## 2020-02-16 ENCOUNTER — Other Ambulatory Visit: Payer: Self-pay | Admitting: Family Medicine

## 2020-02-17 ENCOUNTER — Telehealth: Payer: Self-pay | Admitting: Family Medicine

## 2020-02-17 NOTE — Telephone Encounter (Signed)
Left message for patient to schedule Annual Wellness Visit.  Please schedule with Nurse Health Advisor Victoria Britt, RN at [facility]  

## 2020-02-19 ENCOUNTER — Other Ambulatory Visit: Payer: Self-pay

## 2020-02-19 ENCOUNTER — Ambulatory Visit (AMBULATORY_SURGERY_CENTER): Payer: Self-pay

## 2020-02-19 VITALS — Temp 97.8°F | Ht 63.0 in | Wt 200.4 lb

## 2020-02-19 DIAGNOSIS — Z86010 Personal history of colonic polyps: Secondary | ICD-10-CM

## 2020-02-19 NOTE — Progress Notes (Signed)
No allergies to soy or egg Pt is not on blood thinners or diet pills Denies issues with sedation/intubation Denies atrial flutter/fib Denies constipation   Emmi instructions given to pt  Pt is aware of Covid safety and care partner requirements.  

## 2020-02-23 DIAGNOSIS — Z1231 Encounter for screening mammogram for malignant neoplasm of breast: Secondary | ICD-10-CM | POA: Diagnosis not present

## 2020-03-02 ENCOUNTER — Encounter: Payer: Self-pay | Admitting: Internal Medicine

## 2020-03-04 ENCOUNTER — Ambulatory Visit (AMBULATORY_SURGERY_CENTER): Payer: Medicare PPO | Admitting: Internal Medicine

## 2020-03-04 ENCOUNTER — Other Ambulatory Visit: Payer: Self-pay

## 2020-03-04 ENCOUNTER — Encounter: Payer: Self-pay | Admitting: Internal Medicine

## 2020-03-04 VITALS — BP 105/72 | HR 54 | Temp 97.1°F | Resp 14 | Ht 63.0 in | Wt 200.4 lb

## 2020-03-04 DIAGNOSIS — D124 Benign neoplasm of descending colon: Secondary | ICD-10-CM

## 2020-03-04 DIAGNOSIS — Z86010 Personal history of colonic polyps: Secondary | ICD-10-CM

## 2020-03-04 MED ORDER — SODIUM CHLORIDE 0.9 % IV SOLN: 500.0000 mL | INTRAVENOUS | Status: DC

## 2020-03-04 NOTE — Progress Notes (Signed)
Vs -DT  Temp-LS

## 2020-03-04 NOTE — Op Note (Addendum)
[facility] Patient Name: Kristine Olson Procedure Date: 03/04/2020 1:26 PM MRN: [id number] Endoscopist: Gatha Mayer , MD Age: 74 Referring MD:  Date of Birth: 23-Dec-1945 Gender: Female Account #: [id number] Procedure:                Colonoscopy Indications:              Surveillance: Personal history of adenomatous                            polyps on last colonoscopy > 5 years ago Medicines:                Propofol per Anesthesia, Monitored Anesthesia Care Procedure:                Pre-Anesthesia Assessment:                           - Prior to the procedure, a History and Physical                            was performed, and patient medications and                            allergies were reviewed. The patient's tolerance of                            previous anesthesia was also reviewed. The risks                            and benefits of the procedure and the sedation                            options and risks were discussed with the patient.                            All questions were answered, and informed consent                            was obtained. Prior Anticoagulants: The patient has                            taken no previous anticoagulant or antiplatelet                            agents. ASA Grade Assessment: II - A patient with                            mild systemic disease. After reviewing the risks                            and benefits, the patient was deemed in                            satisfactory condition to undergo the procedure.  After obtaining informed consent, the colonoscope                            was passed under direct vision. Throughout the                            procedure, the patient's blood pressure, pulse, and                            oxygen saturations were monitored continuously. The                            Colonoscope was introduced through the anus and     advanced to the the cecum, identified by                            appendiceal orifice and ileocecal valve. The                            colonoscopy was performed without difficulty. The                            patient tolerated the procedure well. The quality                            of the bowel preparation was good. The ileocecal                            valve, appendiceal orifice, and rectum were                            photographed. The bowel preparation used was                            Miralax via split dose instruction. Scope In: 1:40:20 PM Scope Out: 1:53:09 PM Scope Withdrawal Time: 0 hours 8 minutes 21 seconds  Total Procedure Duration: 0 hours 12 minutes 49 seconds  Findings:                 The perianal and digital rectal examinations were                            normal.                           A diminutive polyp was found in the descending                            colon. The polyp was sessile. The polyp was removed                            with a cold snare. Resection and retrieval were                            complete.  Multiple small and large-mouthed diverticula were                            found in the entire colon.                           The exam was otherwise without abnormality on                            direct and retroflexion views. Complications:            No immediate complications. Estimated Blood Loss:     Estimated blood loss was minimal. Impression:               - One diminutive polyp in the descending colon,                            removed with a cold snare. Resected and retrieved.                           - Diverticulosis in the entire examined colon.                           - The examination was otherwise normal on direct                            and retroflexion views. Except does have mildly                            inflamed internal hemorrhoids.                           -  Personal history of colonic polyps. Recommendation:           - Patient has a contact number available for                            emergencies. The signs and symptoms of potential                            delayed complications were discussed with the                            patient. Return to normal activities tomorrow.                            Written discharge instructions were provided to the                            patient.                           - Resume previous diet.                           - Continue present medications.                           -  No recommendation at this time regarding repeat                            colonoscopy. She will consider hemorrhoid banding                            and let me know. Gatha Mayer, MD 03/04/2020 2:08:50 PM This report has been signed electronically.

## 2020-03-04 NOTE — Progress Notes (Signed)
A/ox3, pleased with MAC, report to RN 

## 2020-03-04 NOTE — Patient Instructions (Addendum)
Handouts given:  Polyps, Hemorrhoids, Diverticulosis Resume previous diet Continue present medications Await pathology results   I found and removed one tiny polyp that looks benign.  I do not think you will require a routine repeat colonoscopy - will let you know.  I appreciate the opportunity to care for you. Gatha Mayer, MD, FACG YOU HAD AN ENDOSCOPIC PROCEDURE TODAY AT [facility]:   Refer to the procedure report that was given to you for any specific questions about what was found during the examination.  If the procedure report does not answer your questions, please call your gastroenterologist to clarify.  If you requested that your care partner not be given the details of your procedure findings, then the procedure report has been included in a sealed envelope for you to review at your convenience later.  YOU SHOULD EXPECT: Some feelings of bloating in the abdomen. Passage of more gas than usual.  Walking can help get rid of the air that was put into your GI tract during the procedure and reduce the bloating. If you had a lower endoscopy (such as a colonoscopy or flexible sigmoidoscopy) you may notice spotting of blood in your stool or on the toilet paper. If you underwent a bowel prep for your procedure, you may not have a normal bowel movement for a few days.  Please Note:  You might notice some irritation and congestion in your nose or some drainage.  This is from the oxygen used during your procedure.  There is no need for concern and it should clear up in a day or so.  SYMPTOMS TO REPORT IMMEDIATELY:   Following lower endoscopy (colonoscopy or flexible sigmoidoscopy):  Excessive amounts of blood in the stool  Significant tenderness or worsening of abdominal pains  Swelling of the abdomen that is new, acute  Fever of 100F or higher   For urgent or emergent issues, a gastroenterologist can be reached at any hour by calling [phone]. Do not use  MyChart messaging for urgent concerns.    DIET:  We do recommend a small meal at first, but then you may proceed to your regular diet.  Drink plenty of fluids but you should avoid alcoholic beverages for 24 hours.  ACTIVITY:  You should plan to take it easy for the rest of today and you should NOT DRIVE or use heavy machinery until tomorrow (because of the sedation medicines used during the test).    FOLLOW UP: Our staff will call the number listed on your records 48-72 hours following your procedure to check on you and address any questions or concerns that you may have regarding the information given to you following your procedure. If we do not reach you, we will leave a message.  We will attempt to reach you two times.  During this call, we will ask if you have developed any symptoms of COVID 19. If you develop any symptoms (ie: fever, flu-like symptoms, shortness of breath, cough etc.) before then, please call [phone].  If you test positive for Covid 19 in the 2 weeks post procedure, please call and report this information to [place].    If any biopsies were taken you will be contacted by phone or by letter within the next 1-3 weeks.  Please call us at [phone] if you have not heard about the biopsies in 3 weeks.    SIGNATURES/CONFIDENTIALITY: You and/or your care partner have signed paperwork which will be entered into your electronic medical  record.  These signatures attest to the fact that that the information above on your After Visit Summary has been reviewed and is understood.  Full responsibility of the confidentiality of this discharge information lies with you and/or your care-partner. 

## 2020-03-04 NOTE — Progress Notes (Signed)
Pt's states no medical or surgical changes since previsit or office visit. 

## 2020-03-04 NOTE — Progress Notes (Signed)
Called to room to assist during endoscopic procedure.  Patient ID and intended procedure confirmed with present staff. Received instructions for my participation in the procedure from the performing physician.  

## 2020-03-08 ENCOUNTER — Telehealth: Payer: Self-pay

## 2020-03-08 ENCOUNTER — Telehealth: Payer: Self-pay | Admitting: *Deleted

## 2020-03-08 NOTE — Telephone Encounter (Signed)
Called [phone] and left a messaged we tried to reach pt for a follow up call. maw

## 2020-03-08 NOTE — Telephone Encounter (Signed)
Attempted f/u phone call. No answer. Left message. °

## 2020-03-15 DIAGNOSIS — F411 Generalized anxiety disorder: Secondary | ICD-10-CM | POA: Diagnosis not present

## 2020-03-29 ENCOUNTER — Telehealth: Payer: Self-pay | Admitting: Family Medicine

## 2020-03-29 NOTE — Telephone Encounter (Signed)
Left message for patient to re schedule her Annual Wellness Visit sched for 03/31/20 due to changes in the Health Nurse Advisor's schedule.  Please schedule with Nurse Health Advisor Caroleen Hamman, RN at [facility]

## 2020-03-31 ENCOUNTER — Ambulatory Visit: Payer: Medicare PPO

## 2020-05-16 ENCOUNTER — Other Ambulatory Visit: Payer: Self-pay | Admitting: Family Medicine

## 2020-05-30 ENCOUNTER — Other Ambulatory Visit: Payer: Self-pay | Admitting: Family Medicine

## 2020-05-31 NOTE — Progress Notes (Signed)
Subjective:   Kristine Olson is a 74 y.o. female who presents for Medicare Annual (Subsequent) preventive examination.  I connected with Kasiyah today by telephone and verified that I am speaking with the correct person using two identifiers. Location patient: home Location provider: work Persons participating in the virtual visit: patient, Marine scientist.    I discussed the limitations, risks, security and privacy concerns of performing an evaluation and management service by telephone and the availability of in person appointments. I also discussed with the patient that there may be a patient responsible charge related to this service. The patient expressed understanding and verbally consented to this telephonic visit.    Interactive audio and video telecommunications were attempted between this provider and patient, however failed, due to patient having technical difficulties OR patient did not have access to video capability.  We continued and completed visit with audio only.  Some vital signs may be absent or patient reported.   Time Spent with patient on telephone encounter: 25 minutes  Review of Systems     Cardiac Risk Factors include: advanced age (>41men, >32 women);hypertension;obesity (BMI >30kg/m2);sedentary lifestyle     Objective:    Today's Vitals   06/01/20 1332  Weight: 202 lb (91.6 kg)  Height: 5\' 3"  (1.6 m)   Body mass index is 35.78 kg/m.  Advanced Directives 06/01/2020 04/24/2018 08/27/2017 08/27/2017 08/21/2017 04/20/2017 05/29/2016  Does Patient Have a Medical Advance Directive? Yes Yes Yes Yes Yes Yes Yes  Type of Paramedic of [place];Living will [place];Living will [place];Living will [place];Living will [place];Living will Living will;Healthcare Power of [place]  Does patient want to make changes to medical advance directive?  - - No - Patient declined - - - No - Patient declined  Copy of [place] in Chart? No - copy requested - No - copy requested No - copy requested No - copy requested No - copy requested No - copy requested    Current Medications (verified) Outpatient Encounter Medications as of 06/01/2020  Medication Sig  . cetirizine (ZYRTEC) 10 MG tablet Take 10 mg by mouth daily as needed for allergies.  . cholecalciferol (VITAMIN D) 1000 units tablet Take 1,000 Units by mouth daily.  . citalopram (CELEXA) 20 MG tablet   . levothyroxine (SYNTHROID) 112 MCG tablet TAKE 1 TABLET DAILY EXCEPT FOR 1 AND 1/2 TABLETS ON WEDNESDAY  . metoprolol tartrate (LOPRESSOR) 25 MG tablet TAKE 0.5 TABLETS (12.5 MG TOTAL) BY MOUTH 2 (TWO) TIMES DAILY.  . pantoprazole (PROTONIX) 20 MG tablet TAKE 1 TABLET BY MOUTH EVERY DAY  . spironolactone (ALDACTONE) 25 MG tablet TAKE 1 TABLET BY MOUTH EVERY DAY   No facility-administered encounter medications on file as of 06/01/2020.    Allergies (verified) Cefaclor, Penicillins, Sulfa antibiotics, and Advair diskus [fluticasone-salmeterol]   History: Past Medical History:  Diagnosis Date  . Acute pyloric channel ulcer with hemorrhage on Xarelto   . Anemia    history of anemia after bleeding ulcer  . Arthritis    ostearthritis - right  medial knee  . Cataract    beginning cataracts  . Complication of anesthesia    was given a medication while waiting to go to surgery that caused burning sensation on scalp was given another medication and the burning went away  . Depression   . GERD (gastroesophageal reflux disease)    "heartburn daily"-TUMS helpful  . History  of blood transfusion    2 units PRBC  . Hx of sinus tachycardia    rarely any problems since metoprolol use  . Hyperlipidemia    patient unaware, has never taking medication  . Hypertension   . Hypothyroidism   . Osteoporosis    osteopenia  . Sleep apnea    uses c pap  . Varicose vein of leg      Past Surgical History:  Procedure Laterality Date  . BIOPSY BREAST     2 biopsies & 2 aspirations  . CESAREAN SECTION    . CLOSED REDUCTION TOE FRACTURE    . COLONOSCOPY  2015  . COLONOSCOPY W/ POLYPECTOMY  multiple since 2007   adenomas, diverticulosis;last 2015  . ESOPHAGOGASTRODUODENOSCOPY (EGD) WITH PROPOFOL N/A 05/29/2016   Procedure: ESOPHAGOGASTRODUODENOSCOPY (EGD) WITH PROPOFOL;  Surgeon: Gatha Mayer, MD;  Location: [facility];  Service: Endoscopy;  Laterality: N/A;  . HAMMER TOE SURGERY    . PARTIAL KNEE ARTHROPLASTY Right 05/22/2016   Procedure: RIGHT KNEE MEDIAL UNICOMPARTMENTAL ARTHROPLASTY;  Surgeon: Gaynelle Arabian, MD;  Location: [facility];  Service: Orthopedics;  Laterality: Right;  . TONSILLECTOMY AND ADENOIDECTOMY    . TOTAL KNEE ARTHROPLASTY Left 08/27/2017   Procedure: LEFT TOTAL KNEE ARTHROPLASTY;  Surgeon: Gaynelle Arabian, MD;  Location: [facility];  Service: Orthopedics;  Laterality: Left;  . WISDOM TOOTH EXTRACTION     Family History  Problem Relation Age of Onset  . Osteoporosis Mother   . Thyroid disease Mother   . Rheum arthritis Mother   . Stroke Maternal Aunt         X 3   . Diabetes Maternal Aunt   . Heart attack Maternal Grandfather        > 70  . Melanoma Brother   . Multiple sclerosis Daughter 95  . Deep vein thrombosis Paternal Aunt   . Other Paternal Aunt        Phlebitis without deep venous thrombosis  . Hypertension Father   . Alcohol abuse Father   . Liver disease Father   . Cancer Maternal Grandmother   . Depression Daughter   . Bipolar disorder Daughter   . Colon cancer Neg Hx   . Colon polyps Neg Hx   . Esophageal cancer Neg Hx   . Rectal cancer Neg Hx   . Stomach cancer Neg Hx    Social History   Socioeconomic History  . Marital status: Married    Spouse name: Not on file  . Number of children: Not on file  . Years of education: Not on file  . Highest education level: Not on file  Occupational History  . Occupation: Retired   Tobacco Use  . Smoking status: Never Smoker  . Smokeless tobacco: Never Used  Vaping Use  . Vaping Use: Never used  Substance and Sexual Activity  . Alcohol use: Yes    Alcohol/week: 0.0 standard drinks    Comment:  very rarely  . Drug use: No  . Sexual activity: Yes    Birth control/protection: Post-menopausal  Other Topics Concern  . Not on file  Social History Narrative  . Not on file   Social Determinants of Health   Financial Resource Strain: Low Risk   . Difficulty of Paying Living Expenses: Not hard at all  Food Insecurity: No Food Insecurity  . Worried About Charity fundraiser in the Last Year: Never true  . Ran Out of Food in the Last Year: Never true  Transportation Needs:  No Transportation Needs  . Lack of Transportation (Medical): No  . Lack of Transportation (Non-Medical): No  Physical Activity: Inactive  . Days of Exercise per Week: 0 days  . Minutes of Exercise per Session: 0 min  Stress: No Stress Concern Present  . Feeling of Stress : Not at all  Social Connections: Socially Integrated  . Frequency of Communication with Friends and Family: More than three times a week  . Frequency of Social Gatherings with Friends and Family: More than three times a week  . Attends Religious Services: More than 4 times per year  . Active Member of Clubs or Organizations: Yes  . Attends Archivist Meetings: More than 4 times per year  . Marital Status: Married    Tobacco Counseling Counseling given: Not Answered   Clinical Intake:  Pre-visit preparation completed: No  Pain : No/denies pain     Nutritional Status: BMI > 30  Obese Nutritional Risks: None Diabetes: No  How often do you need to have someone help you when you read instructions, pamphlets, or other written materials from your doctor or pharmacy?: 1 - Never What is the last grade level you completed in school?: Master's Degree  Diabetic?No  Interpreter Needed?: No  Information  entered by :: Caroleen Hamman LPN   Activities of Daily Living In your present state of health, do you have any difficulty performing the following activities: 06/01/2020 12/25/2019  Hearing? N N  Vision? N N  Difficulty concentrating or making decisions? N N  Walking or climbing stairs? N N  Dressing or bathing? N N  Doing errands, shopping? N N  Preparing Food and eating ? N -  Using the Toilet? N -  In the past six months, have you accidently leaked urine? Y -  Comment wears a pad sometimes -  Do you have problems with loss of bowel control? N -  Managing your Medications? N -  Managing your Finances? N -  Housekeeping or managing your Housekeeping? N -  Some recent data might be hidden    Patient Care Team: Midge Minium, MD as PCP - General (Family Medicine) Charlynn Grimes, MD as Referring Physician (Obstetrics and Gynecology) Gatha Mayer, MD as Consulting Physician (Gastroenterology) Marvia Pickles (Psychiatry) Gaynelle Arabian, MD as Consulting Physician (Orthopedic Surgery) Deneise Lever, MD as Consulting Physician (Pulmonary Disease) Minus Breeding, MD as Consulting Physician (Cardiology)  Indicate any recent Medical Services you may have received from other than [facility] providers in the past year (date may be approximate).     Assessment:   This is a routine wellness examination for [place].  Hearing/Vision screen  Hearing Screening   125Hz  250Hz  500Hz  1000Hz  2000Hz  3000Hz  4000Hz  6000Hz  8000Hz   Right ear:           Left ear:           Comments: No issues  Vision Screening Comments: Wears glasses Last eye exam-2021 Sees Dr. Clent Demark  Dietary issues and exercise activities discussed: Current Exercise Habits: The patient does not participate in regular exercise at present  Goals    . Patient Stated     Lose weight with NOOM      Depression Screen PHQ 2/9 Scores 06/01/2020 12/25/2019 07/07/2019 10/28/2018 04/24/2018 10/08/2017 04/20/2017  PHQ - 2  Score 0 0 0 0 0 0 1  PHQ- 9 Score - 0 - 0 1 0 1  Exception Documentation - - - - - - -    Fall Risk  Fall Risk  06/01/2020 12/25/2019 07/07/2019 10/28/2018 04/24/2018  Falls in the past year? 0 0 0 0 No  Number falls in past yr: 0 0 0 - -  Injury with Fall? 0 0 0 - -  Follow up Falls prevention discussed Falls evaluation completed - - -    Any stairs in or around the home? Yes  If so, are there any without handrails? No  Home free of loose throw rugs in walkways, pet beds, electrical cords, etc? Yes  Adequate lighting in your home to reduce risk of falls? Yes   ASSISTIVE DEVICES UTILIZED TO PREVENT FALLS:  Life alert? No  Use of a cane, walker or w/c? No  Grab bars in the bathroom? Yes  Shower chair or bench in shower? No  Elevated toilet seat or a handicapped toilet? No   TIMED UP AND GO:  Was the test performed? No . Telephone visit   Cognitive Function:No cognitive impairment noted. Patient plays Sudoku & other games for brain health. MMSE - Mini Mental State Exam 04/24/2018  Orientation to time 5  Orientation to Place 5  Registration 3  Attention/ Calculation 5  Recall 3  Language- name 2 objects 2  Language- repeat 1  Language- follow 3 step command 3  Language- read & follow direction 1  Write a sentence 1  Copy design 1  Total score 30        Immunizations Immunization History  Administered Date(s) Administered  . Fluad Quad(high Dose 65+) 07/07/2019  . Hepatitis A, Adult 10/04/2012  . Hepatitis B, adult 10/04/2012  . IPV 10/04/2012  . Influenza, High Dose Seasonal PF 09/06/2018  . Influenza,inj,Quad PF,6+ Mos 10/09/2016, 07/24/2017, 09/06/2018  . Influenza-Unspecified 06/23/2012, 06/27/2017  . Meningococcal Conjugate 10/04/2012  . PFIZER SARS-COV-2 Vaccination 11/12/2019, 12/03/2019  . Pneumococcal Conjugate-13 04/17/2016  . Pneumococcal Polysaccharide-23 04/20/2017  . Td 10/24/2003  . Tdap 10/04/2012    TDAP status: Up to date   Flu Vaccine status: Up  to date   Pneumococcal vaccine status: Up to date   Covid-19 vaccine status: Completed vaccines  Qualifies for Shingles Vaccine? Yes   12/13/2023Zostavax completed No   Shingrix Completed?: No.    Education has been provided regarding the importance of this vaccine. Patient has been advised to call insurance company to determine out of pocket expense if they have not yet received this vaccine. Advised may also receive vaccine at local pharmacy or Health Dept. Verbalized acceptance and understanding.  Screening Tests Health Maintenance  Topic Date Due  . DEXA SCAN  08/30/2018  . INFLUENZA VACCINE  05/23/2020  . Hepatitis C Screening  07/06/2020 (Originally 29-May-1946)  . MAMMOGRAM  02/22/2022  . TETANUS/TDAP  10/04/2022  . COLONOSCOPY  03/05/2025  . COVID-19 Vaccine  Completed  . PNA vac Low Risk Adult  Completed    Health Maintenance  Health Maintenance Due  Topic Date Due  . DEXA SCAN  08/30/2018  . INFLUENZA VACCINE  05/23/2020    Colorectal cancer screening: Completed 03/05/2020. Repeat every 5 years   Mammogram status: Completed 02/23/2020. Repeat every year   Bone Density status: Completed 08/30/2016. Results reflect: Bone density results: OSTEOPENIA. Repeat every 2 years. Patient declined at this time.  Lung Cancer Screening: (Low Dose CT Chest recommended if Age 85-80 years, 30 pack-year currently smoking OR have quit w/in 15years.) does not qualify.     Additional Screening:  Hepatitis C Screening: does qualify; Discuss with PCP  Vision Screening: Recommended annual ophthalmology exams for  early detection of glaucoma and other disorders of the eye. Is the patient up to date with their annual eye exam?  Yes  Who is the provider or what is the name of the office in which the patient attends annual eye exams? Dr. Clent Demark   Dental Screening: Recommended annual dental exams for proper oral hygiene  Community Resource Referral / Chronic Care Management: CRR  required this visit?  No   CCM required this visit?  No      Plan:     I have personally reviewed and noted the following in the patient's chart:   . Medical and social history . Use of alcohol, tobacco or illicit drugs  . Current medications and supplements . Functional ability and status . Nutritional status . Physical activity . Advanced directives . List of other physicians . Hospitalizations, surgeries, and ER visits in previous 12 months . Vitals . Screenings to include cognitive, depression, and falls . Referrals and appointments  In addition, I have reviewed and discussed with patient certain preventive protocols, quality metrics, and best practice recommendations. A written personalized care plan for preventive services as well as general preventive health recommendations were provided to patient.    Due to this being a telephonic visit, the after visit summary with patients personalized plan was offered to patient via mail or my-chart.  Patient would like to access on my-chart.  Marta Antu, LPN   [id number]/68/1157  Nurse Health Advisor  Nurse Notes: None

## 2020-06-01 ENCOUNTER — Ambulatory Visit (INDEPENDENT_AMBULATORY_CARE_PROVIDER_SITE_OTHER): Payer: Medicare PPO

## 2020-06-01 VITALS — Ht 63.0 in | Wt 202.0 lb

## 2020-06-01 DIAGNOSIS — Z0000 Encounter for general adult medical examination without abnormal findings: Secondary | ICD-10-CM | POA: Diagnosis not present

## 2020-06-01 NOTE — Patient Instructions (Signed)
Kristine Olson , Thank you for taking time to complete your Medicare Wellness Visit. I appreciate your ongoing commitment to your health goals. Please review the following plan we discussed and let me know if I can assist you in the future.   Screening recommendations/referrals: Colonoscopy: Completed 03/05/2020- Due 03/06/2025 Mammogram: Completed 02/23/2020- Due 02/22/2021 Bone Density: Completed 08/30/2016- Past Due- Declined at this time.Please call the office when you are ready to schedule. Recommended yearly ophthalmology/optometry visit for glaucoma screening and checkup Recommended yearly dental visit for hygiene and checkup  Vaccinations: Influenza vaccine: Due 06/2020 Pneumococcal vaccine: Completed vaccines Tdap vaccine: up to Date- Due-12/213/2023 Shingles vaccine: Discuss with pharmacy   Covid-19:Completed vaccines  Advanced directives: Please bring a copy to your next appointment  Conditions/risks identified: See problem list  Next appointment: Follow up in one year for your annual wellness visit 06/06/2021 @ 1:30pm   Preventive Care 65 Years and Older, Female Preventive care refers to lifestyle choices and visits with your health care provider that can promote health and wellness. What does preventive care include?  A yearly physical exam. This is also called an annual well check.  Dental exams once or twice a year.  Routine eye exams. Ask your health care provider how often you should have your eyes checked.  Personal lifestyle choices, including:  Daily care of your teeth and gums.  Regular physical activity.  Eating a healthy diet.  Avoiding tobacco and drug use.  Limiting alcohol use.  Practicing safe sex.  Taking low-dose aspirin every day.  Taking vitamin and mineral supplements as recommended by your health care provider. What happens during an annual well check? The services and screenings done by your health care provider during your annual well check  will depend on your age, overall health, lifestyle risk factors, and family history of disease. Counseling  Your health care provider may ask you questions about your:  Alcohol use.  Tobacco use.  Drug use.  Emotional well-being.  Home and relationship well-being.  Sexual activity.  Eating habits.  History of falls.  Memory and ability to understand (cognition).  Work and work Statistician.  Reproductive health. Screening  You may have the following tests or measurements:  Height, weight, and BMI.  Blood pressure.  Lipid and cholesterol levels. These may be checked every 5 years, or more frequently if you are over 66 years old.  Skin check.  Lung cancer screening. You may have this screening every year starting at age 70 if you have a 30-pack-year history of smoking and currently smoke or have quit within the past 15 years.  Fecal occult blood test (FOBT) of the stool. You may have this test every year starting at age 55.  Flexible sigmoidoscopy or colonoscopy. You may have a sigmoidoscopy every 5 years or a colonoscopy every 10 years starting at age 66.  Hepatitis C blood test.  Hepatitis B blood test.  Sexually transmitted disease (STD) testing.  Diabetes screening. This is done by checking your blood sugar (glucose) after you have not eaten for a while (fasting). You may have this done every 1-3 years.  Bone density scan. This is done to screen for osteoporosis. You may have this done starting at age 60.  Mammogram. This may be done every 1-2 years. Talk to your health care provider about how often you should have regular mammograms. Talk with your health care provider about your test results, treatment options, and if necessary, the need for more tests. Vaccines  Your health  care provider may recommend certain vaccines, such as:  Influenza vaccine. This is recommended every year.  Tetanus, diphtheria, and acellular pertussis (Tdap, Td) vaccine. You may  need a Td booster every 10 years.  Zoster vaccine. You may need this after age 82.  Pneumococcal 13-valent conjugate (PCV13) vaccine. One dose is recommended after age 72.  Pneumococcal polysaccharide (PPSV23) vaccine. One dose is recommended after age 10. Talk to your health care provider about which screenings and vaccines you need and how often you need them. This information is not intended to replace advice given to you by your health care provider. Make sure you discuss any questions you have with your health care provider. Document Released: 11/05/2015 Document Revised: 06/28/2016 Document Reviewed: 08/10/2015 Elsevier Interactive Patient Education  2017 [place] Prevention in the Home Falls can cause injuries. They can happen to people of all ages. There are many things you can do to make your home safe and to help prevent falls. What can I do on the outside of my home?  Regularly fix the edges of walkways and driveways and fix any cracks.  Remove anything that might make you trip as you walk through a door, such as a raised step or threshold.  Trim any bushes or trees on the path to your home.  Use bright outdoor lighting.  Clear any walking paths of anything that might make someone trip, such as rocks or tools.  Regularly check to see if handrails are loose or broken. Make sure that both sides of any steps have handrails.  Any raised decks and porches should have guardrails on the edges.  Have any leaves, snow, or ice cleared regularly.  Use sand or salt on walking paths during winter.  Clean up any spills in your garage right away. This includes oil or grease spills. What can I do in the bathroom?  Use night lights.  Install grab bars by the toilet and in the tub and shower. Do not use towel bars as grab bars.  Use non-skid mats or decals in the tub or shower.  If you need to sit down in the shower, use a plastic, non-slip stool.  Keep the floor  dry. Clean up any water that spills on the floor as soon as it happens.  Remove soap buildup in the tub or shower regularly.  Attach bath mats securely with double-sided non-slip rug tape.  Do not have throw rugs and other things on the floor that can make you trip. What can I do in the bedroom?  Use night lights.  Make sure that you have a light by your bed that is easy to reach.  Do not use any sheets or blankets that are too big for your bed. They should not hang down onto the floor.  Have a firm chair that has side arms. You can use this for support while you get dressed.  Do not have throw rugs and other things on the floor that can make you trip. What can I do in the kitchen?  Clean up any spills right away.  Avoid walking on wet floors.  Keep items that you use a lot in easy-to-reach places.  If you need to reach something above you, use a strong step stool that has a grab bar.  Keep electrical cords out of the way.  Do not use floor polish or wax that makes floors slippery. If you must use wax, use non-skid floor wax.  Do not have  throw rugs and other things on the floor that can make you trip. What can I do with my stairs?  Do not leave any items on the stairs.  Make sure that there are handrails on both sides of the stairs and use them. Fix handrails that are broken or loose. Make sure that handrails are as long as the stairways.  Check any carpeting to make sure that it is firmly attached to the stairs. Fix any carpet that is loose or worn.  Avoid having throw rugs at the top or bottom of the stairs. If you do have throw rugs, attach them to the floor with carpet tape.  Make sure that you have a light switch at the top of the stairs and the bottom of the stairs. If you do not have them, ask someone to add them for you. What else can I do to help prevent falls?  Wear shoes that:  Do not have high heels.  Have rubber bottoms.  Are comfortable and fit you  well.  Are closed at the toe. Do not wear sandals.  If you use a stepladder:  Make sure that it is fully opened. Do not climb a closed stepladder.  Make sure that both sides of the stepladder are locked into place.  Ask someone to hold it for you, if possible.  Clearly mark and make sure that you can see:  Any grab bars or handrails.  First and last steps.  Where the edge of each step is.  Use tools that help you move around (mobility aids) if they are needed. These include:  Canes.  Walkers.  Scooters.  Crutches.  Turn on the lights when you go into a dark area. Replace any light bulbs as soon as they burn out.  Set up your furniture so you have a clear path. Avoid moving your furniture around.  If any of your floors are uneven, fix them.  If there are any pets around you, be aware of where they are.  Review your medicines with your doctor. Some medicines can make you feel dizzy. This can increase your chance of falling. Ask your doctor what other things that you can do to help prevent falls. This information is not intended to replace advice given to you by your health care provider. Make sure you discuss any questions you have with your health care provider. Document Released: 08/05/2009 Document Revised: 03/16/2016 Document Reviewed: 11/13/2014 Elsevier Interactive Patient Education  2017 [employer].

## 2020-06-14 ENCOUNTER — Telehealth: Payer: Self-pay | Admitting: Family Medicine

## 2020-06-14 NOTE — Telephone Encounter (Signed)
Pt made aware and pt is going to call to schedule testing.

## 2020-06-14 NOTE — Telephone Encounter (Signed)
Please advise 

## 2020-06-14 NOTE — Telephone Encounter (Signed)
Patient was exposed to Covid this weekend - Her daughter was confirmed positive today - daughter was in patients home all weekend.  - Should she be tested - She has been vaccinated, but her daughter had to.  Please advise

## 2020-06-14 NOTE — Telephone Encounter (Signed)
She definitely needs to be tested.  The recommendation is 3-5 days after exposure.  She should go online now and schedule an appt for Thursday.  She also needs to be wearing a mask when around others or outside the home

## 2020-06-20 ENCOUNTER — Other Ambulatory Visit: Payer: Self-pay | Admitting: Family Medicine

## 2020-08-02 DIAGNOSIS — F411 Generalized anxiety disorder: Secondary | ICD-10-CM | POA: Diagnosis not present

## 2020-08-31 ENCOUNTER — Other Ambulatory Visit: Payer: Self-pay | Admitting: Family Medicine

## 2020-10-04 ENCOUNTER — Encounter: Payer: Self-pay | Admitting: Family Medicine

## 2020-10-23 DIAGNOSIS — U071 COVID-19: Secondary | ICD-10-CM

## 2020-10-23 HISTORY — DX: COVID-19: U07.1

## 2020-10-29 ENCOUNTER — Other Ambulatory Visit: Payer: Self-pay

## 2020-10-29 ENCOUNTER — Encounter: Payer: Self-pay | Admitting: Family Medicine

## 2020-10-29 ENCOUNTER — Ambulatory Visit (INDEPENDENT_AMBULATORY_CARE_PROVIDER_SITE_OTHER): Payer: Medicare PPO | Admitting: Family Medicine

## 2020-10-29 VITALS — BP 112/70 | HR 61 | Temp 98.3°F | Resp 19 | Ht 63.0 in | Wt 210.4 lb

## 2020-10-29 DIAGNOSIS — Z23 Encounter for immunization: Secondary | ICD-10-CM | POA: Diagnosis not present

## 2020-10-29 DIAGNOSIS — H66003 Acute suppurative otitis media without spontaneous rupture of ear drum, bilateral: Secondary | ICD-10-CM | POA: Diagnosis not present

## 2020-10-29 MED ORDER — AZITHROMYCIN 250 MG PO TABS: ORAL_TABLET | ORAL | 0 refills | Status: DC

## 2020-10-29 MED ORDER — PREDNISONE 10 MG PO TABS: ORAL_TABLET | ORAL | 0 refills | Status: DC

## 2020-10-29 NOTE — Patient Instructions (Signed)
Schedule your complete physical in 2-3 months (we are overdue) START the Zpack for the ear infection Take the Prednisone as directed- take w/ food Continue the nasal spray and Zyrtec daily Drink plenty of fluids Call with any questions or concerns Hang in there!!!

## 2020-10-29 NOTE — Progress Notes (Signed)
   Subjective:    Patient ID: Kristine Olson, female    DOB: 1946-05-25, 75 y.o.   MRN: [id number]  HPI Fluid in ears- pt reports sxs started 11/30 w/ 'my ears stopping up'.  Waited a week and then started taking her daily Zyrtec.  Has been taking daily x1 month.  I encouraged her by MyChart to start nasal steroid- she did not.  Thought she would wait it out.  Started nasal spray 2 weeks ago- 'and it's still not better'.  + muffled hearing, ear is 'popping'.  L >R.  No fever.  No drainage from ear.  Rare ear pain.   Review of Systems For ROS see HPI   This visit occurred during the SARS-CoV-2 public health emergency.  Safety protocols were in place, including screening questions prior to the visit, additional usage of staff PPE, and extensive cleaning of exam room while observing appropriate contact time as indicated for disinfecting solutions.       Objective:   Physical Exam Vitals reviewed.  Constitutional:      General: She is not in acute distress.    Appearance: Normal appearance. She is obese. She is not ill-appearing or toxic-appearing.  HENT:     Head: Normocephalic and atraumatic.     Right Ear: Ear canal normal. A middle ear effusion is present. Tympanic membrane is injected, erythematous and bulging.     Left Ear: Ear canal normal. A middle ear effusion is present. Tympanic membrane is injected, erythematous and bulging.  Eyes:     Extraocular Movements: Extraocular movements intact.     Conjunctiva/sclera: Conjunctivae normal.     Pupils: Pupils are equal, round, and reactive to light.  Musculoskeletal:     Cervical back: Normal range of motion and neck supple.  Lymphadenopathy:     Cervical: No cervical adenopathy.  Skin:    General: Skin is warm and dry.  Neurological:     General: No focal deficit present.     Mental Status: She is alert and oriented to person, place, and time.  Psychiatric:        Mood and Affect: Mood normal.        Behavior: Behavior  normal.        Thought Content: Thought content normal.           Assessment & Plan:  OM- new.  Bilateral.  Despite lack of severe pain, pt's TMs are red and bulging bilaterally.  Given PCN allergy will start [place].  Will also add Prednisone to help w/ eustachian tube dysfxn/congestion.  Reviewed supportive care and red flags that should prompt return.  Pt expressed understanding and is in agreement w/ plan.

## 2020-11-01 DIAGNOSIS — F411 Generalized anxiety disorder: Secondary | ICD-10-CM | POA: Diagnosis not present

## 2020-11-09 ENCOUNTER — Other Ambulatory Visit: Payer: Self-pay | Admitting: Family Medicine

## 2020-11-09 ENCOUNTER — Telehealth: Payer: Self-pay | Admitting: Emergency Medicine

## 2020-11-09 NOTE — Telephone Encounter (Signed)
Patient was seen on 10/29/20 for ear infection. She has finished her Zpak and Prednisone and her ears do feel a little better but not 100%. She is having muffling, stopped up but little pain. Please advise

## 2020-11-10 ENCOUNTER — Telehealth: Payer: Self-pay

## 2020-11-10 NOTE — Telephone Encounter (Signed)
I'm glad things are improving!  She should make sure she is taking her Zyrtec daily and if not already doing so, add Flonase to improve ear congestion and muffled hearing

## 2020-11-10 NOTE — Telephone Encounter (Signed)
See telephone note for full note 

## 2020-11-10 NOTE — Telephone Encounter (Signed)
Called and left a message for pt to take zyrtec and use the flonase nasal spray per Dr. Birdie Riddle. Pt will call office back if there are any further concerns with ears.

## 2020-11-17 ENCOUNTER — Other Ambulatory Visit: Payer: Self-pay | Admitting: Family Medicine

## 2020-11-22 ENCOUNTER — Telehealth: Payer: Self-pay

## 2020-11-22 ENCOUNTER — Encounter: Payer: Self-pay | Admitting: Family Medicine

## 2020-11-22 ENCOUNTER — Other Ambulatory Visit: Payer: Self-pay

## 2020-11-22 DIAGNOSIS — H9193 Unspecified hearing loss, bilateral: Secondary | ICD-10-CM

## 2020-11-22 NOTE — Telephone Encounter (Signed)
Called and patient a message informing her that her referral has been placed to ENT.

## 2020-12-10 ENCOUNTER — Other Ambulatory Visit: Payer: Self-pay

## 2020-12-10 ENCOUNTER — Ambulatory Visit (INDEPENDENT_AMBULATORY_CARE_PROVIDER_SITE_OTHER): Payer: Medicare PPO | Admitting: Otolaryngology

## 2020-12-10 ENCOUNTER — Encounter (INDEPENDENT_AMBULATORY_CARE_PROVIDER_SITE_OTHER): Payer: Self-pay | Admitting: Otolaryngology

## 2020-12-10 VITALS — Temp 97.7°F

## 2020-12-10 DIAGNOSIS — J310 Chronic rhinitis: Secondary | ICD-10-CM

## 2020-12-10 DIAGNOSIS — H9012 Conductive hearing loss, unilateral, left ear, with unrestricted hearing on the contralateral side: Secondary | ICD-10-CM

## 2020-12-10 DIAGNOSIS — H6522 Chronic serous otitis media, left ear: Secondary | ICD-10-CM | POA: Diagnosis not present

## 2020-12-10 NOTE — Progress Notes (Signed)
HPI: Kristine Olson is a 75 y.o. 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  She developed congestion in her nose as well as pressure in her sinuses and thought this was sinus related.  She was treated with prednisone and a Z-Pak as well as Zyrtec.  She is also tried a nasal decongestant spray.  However the hearing is not improved and has been persistent blockage for several months now.  On exam she was told that she had a left serous otitis media..  Past Medical History:  Diagnosis Date  . Acute pyloric channel ulcer with hemorrhage on Xarelto   . Anemia    history of anemia after bleeding ulcer  . Arthritis    ostearthritis - right  medial knee  . Cataract    beginning cataracts  . Complication of anesthesia    was given a medication while waiting to go to surgery that caused burning sensation on scalp was given another medication and the burning went away  . Depression   . GERD (gastroesophageal reflux disease)    "heartburn daily"-TUMS helpful  . History of blood transfusion    2 units PRBC  . Hx of sinus tachycardia    rarely any problems since metoprolol use  . Hyperlipidemia    patient unaware, has never taking medication  . Hypertension   . Hypothyroidism   . Osteoporosis    osteopenia  . Sleep apnea    uses c pap  . Varicose vein of leg    Past Surgical History:  Procedure Laterality Date  . BIOPSY BREAST     2 biopsies & 2 aspirations  . CESAREAN SECTION    . CLOSED REDUCTION TOE FRACTURE    . COLONOSCOPY  2015  . COLONOSCOPY W/ POLYPECTOMY  multiple since 2007   adenomas, diverticulosis;last 2015  . ESOPHAGOGASTRODUODENOSCOPY (EGD) WITH PROPOFOL N/A 05/29/2016   Procedure: ESOPHAGOGASTRODUODENOSCOPY (EGD) WITH PROPOFOL;  Surgeon: Gatha Mayer, MD;  Location: [facility];  Service: Endoscopy;  Laterality: N/A;  . HAMMER TOE SURGERY    .  PARTIAL KNEE ARTHROPLASTY Right 05/22/2016   Procedure: RIGHT KNEE MEDIAL UNICOMPARTMENTAL ARTHROPLASTY;  Surgeon: Gaynelle Arabian, MD;  Location: [facility];  Service: Orthopedics;  Laterality: Right;  . TONSILLECTOMY AND ADENOIDECTOMY    . TOTAL KNEE ARTHROPLASTY Left 08/27/2017   Procedure: LEFT TOTAL KNEE ARTHROPLASTY;  Surgeon: Gaynelle Arabian, MD;  Location: [facility];  Service: Orthopedics;  Laterality: Left;  . WISDOM TOOTH EXTRACTION     Social History   Socioeconomic History  . Marital status: Married    Spouse name: Not on file  . Number of children: Not on file  . Years of education: Not on file  . Highest education level: Not on file  Occupational History  . Occupation: Retired  Tobacco Use  . Smoking status: Never Smoker  . Smokeless tobacco: Never Used  Vaping Use  . Vaping Use: Never used  Substance and Sexual Activity  . Alcohol use: Yes    Alcohol/week: 0.0 standard drinks    Comment:  very rarely  . Drug use: No  . Sexual activity: Yes    Birth control/protection: Post-menopausal  Other Topics Concern  . Not on file  Social History Narrative  . Not on file   Social Determinants of Health   Financial Resource Strain: Low Risk   . Difficulty of Paying Living Expenses:  Not hard at all  Food Insecurity: No Food Insecurity  . Worried About Charity fundraiser in the Last Year: Never true  . Ran Out of Food in the Last Year: Never true  Transportation Needs: No Transportation Needs  . Lack of Transportation (Medical): No  . Lack of Transportation (Non-Medical): No  Physical Activity: Inactive  . Days of Exercise per Week: 0 days  . Minutes of Exercise per Session: 0 min  Stress: No Stress Concern Present  . Feeling of Stress : Not at all  Social Connections: Socially Integrated  . Frequency of Communication with Friends and Family: More than three times a week  . Frequency of Social Gatherings with Friends and Family: More than three times a week  . Attends  Religious Services: More than 4 times per year  . Active Member of Clubs or Organizations: Yes  . Attends Archivist Meetings: More than 4 times per year  . Marital Status: Married   Family History  Problem Relation Age of Onset  . Osteoporosis Mother   . Thyroid disease Mother   . Rheum arthritis Mother   . Stroke Maternal Aunt         X 3   . Diabetes Maternal Aunt   . Heart attack Maternal Grandfather        > 70  . Melanoma Brother   . Multiple sclerosis Daughter 3  . Deep vein thrombosis Paternal Aunt   . Other Paternal Aunt        Phlebitis without deep venous thrombosis  . Hypertension Father   . Alcohol abuse Father   . Liver disease Father   . Cancer Maternal Grandmother   . Depression Daughter   . Bipolar disorder Daughter   . Colon cancer Neg Hx   . Colon polyps Neg Hx   . Esophageal cancer Neg Hx   . Rectal cancer Neg Hx   . Stomach cancer Neg Hx    Allergies  Allergen Reactions  . Cefaclor Rash    Because of a history of documented adverse serious drug reaction;Medi Alert bracelet  is recommended  . Penicillins     REACTION: rash Because of a history of documented adverse serious drug reaction;Medi Alert bracelet  is recommended Has patient had a PCN reaction causing immediate rash, facial/tongue/throat swelling, SOB or lightheadedness with hypotension: no Has patient had a PCN reaction causing severe rash involving mucus membranes or skin necrosis: {no Has patient had a PCN reaction that required hospitalization no Has patient had a PCN reaction occurring within the last 10 years: no If all of the above ans  . Sulfa Antibiotics Rash    01/22/13 after 9 days of Sulfa Because of a history of documented adverse serious drug reaction;Medi Alert bracelet  is recommended  . Advair Diskus [Fluticasone-Salmeterol]     "Felt weird" and flushing.   Prior to Admission medications   Medication Sig Start Date End Date Taking? Authorizing Provider   azithromycin (ZITHROMAX) 250 MG tablet 2 tabs on day 1, 1 tab on day 2-5 10/29/20   Midge Minium, MD  cetirizine (ZYRTEC) 10 MG tablet Take 10 mg by mouth daily as needed for allergies.    [provider]  cholecalciferol (VITAMIN D) 1000 units tablet Take 1,000 Units by mouth daily.    [provider]  citalopram (CELEXA) 20 MG tablet  04/17/19   [provider]  levothyroxine (SYNTHROID) 112 MCG tablet TAKE 1 TABLET DAILY EXCEPT FOR  1 AND 1/2 TABLETS ON [facility] 08/31/20   Midge Minium, MD  metoprolol tartrate (LOPRESSOR) 25 MG tablet TAKE 0.5 TABLETS (12.5 MG TOTAL) BY MOUTH 2 (TWO) TIMES DAILY. 11/09/20   Midge Minium, MD  pantoprazole (PROTONIX) 20 MG tablet TAKE 1 TABLET BY MOUTH EVERY DAY 06/21/20   Midge Minium, MD  predniSONE (DELTASONE) 10 MG tablet 3 tabs x3 days and then 2 tabs x3 days and then 1 tab x3 days.  Take w/ food. 10/29/20   Midge Minium, MD  spironolactone (ALDACTONE) 25 MG tablet TAKE 1 TABLET BY MOUTH EVERY DAY 11/17/20   Midge Minium, MD     Positive ROS: Otherwise negative  All other systems have been reviewed and were otherwise negative with the exception of those mentioned in the HPI and as above.  Physical Exam: Constitutional: Alert, well-appearing, no acute distress Ears: External ears without lesions or tenderness.  Ear canals are clear.  Right TM is clear with good mobility on pneumatic otoscopy.  Left TM with a's left serous otitis media.  On tuning fork testing AC was greater than BC on the right side and BC was graded AC on the left side.  Weber lateralized to the left.  I was able to insufflate some air behind the left TM in the office today with improved hearing. Nasal: External nose without lesions. Septum with mild deformity.  Moderate rhinitis.  No signs of infection..  Nasopharyngoscopy was performed and on nasopharyngoscopy the left eustachian tube opening was unobstructed although was  edematous.  Middle meatus region was clear with no signs of mucopurulent discharge.  Nasopharynx was otherwise clear. Oral: Lips and gums without lesions. Tongue and palate mucosa without lesions. Posterior oropharynx clear. Neck: No palpable adenopathy or masses Respiratory: Breathing comfortably  Skin: No facial/neck lesions or rash noted.  Procedures  Assessment: Left serous otitis media for close to 3 months. Mild rhinitis with eustachian tube dysfunction.  Plan: Discussed with patient concerning findings.  Would recommend initially use of nasal steroid spray 2 sprays each nostril at night and trying to clear the nose and "pop" the ear with frequent nose blowing.  I also discussed the option of placing a tube in the left ear.  Apparently her past husband developed pneumonia shortly after it having tubes placed in his ears which led to his demise and she has some phobia of this. However if she does not have substantial improvement in her hearing in 2 to 3 weeks she will call us back concerning possible placement of a myringotomy tube.   Radene Journey, MD   CC:

## 2021-01-05 ENCOUNTER — Ambulatory Visit (INDEPENDENT_AMBULATORY_CARE_PROVIDER_SITE_OTHER): Payer: Medicare PPO | Admitting: Otolaryngology

## 2021-01-05 ENCOUNTER — Other Ambulatory Visit: Payer: Self-pay

## 2021-01-05 VITALS — Temp 97.7°F

## 2021-01-05 DIAGNOSIS — H6522 Chronic serous otitis media, left ear: Secondary | ICD-10-CM | POA: Diagnosis not present

## 2021-01-05 DIAGNOSIS — H9012 Conductive hearing loss, unilateral, left ear, with unrestricted hearing on the contralateral side: Secondary | ICD-10-CM

## 2021-01-05 NOTE — Progress Notes (Signed)
HPI: Kristine Olson is a 75 y.o. female who returns today for evaluation of chronic left ear problems dating back to last November.  She is noticing decreased hearing in the left ear as well as popping and crackling in the left ear.  I had seen her several weeks ago and recommended regular use of nasal steroid spray which she used for a while but now is stopped again.  She still has decreased hearing in the left ear and presents today to discuss possible placement of a myringotomy tube in the left ear..  Past Medical History:  Diagnosis Date  . Acute pyloric channel ulcer with hemorrhage on Xarelto   . Anemia    history of anemia after bleeding ulcer  . Arthritis    ostearthritis - right  medial knee  . Cataract    beginning cataracts  . Complication of anesthesia    was given a medication while waiting to go to surgery that caused burning sensation on scalp was given another medication and the burning went away  . Depression   . GERD (gastroesophageal reflux disease)    "heartburn daily"-TUMS helpful  . History of blood transfusion    2 units PRBC  . Hx of sinus tachycardia    rarely any problems since metoprolol use  . Hyperlipidemia    patient unaware, has never taking medication  . Hypertension   . Hypothyroidism   . Osteoporosis    osteopenia  . Sleep apnea    uses c pap  . Varicose vein of leg    Past Surgical History:  Procedure Laterality Date  . BIOPSY BREAST     2 biopsies & 2 aspirations  . CESAREAN SECTION    . CLOSED REDUCTION TOE FRACTURE    . COLONOSCOPY  2015  . COLONOSCOPY W/ POLYPECTOMY  multiple since 2007   adenomas, diverticulosis;last 2015  . ESOPHAGOGASTRODUODENOSCOPY (EGD) WITH PROPOFOL N/A 05/29/2016   Procedure: ESOPHAGOGASTRODUODENOSCOPY (EGD) WITH PROPOFOL;  Surgeon: Gatha Mayer, MD;  Location: [facility];  Service: Endoscopy;  Laterality: N/A;  . HAMMER TOE SURGERY    . PARTIAL KNEE ARTHROPLASTY Right 05/22/2016   Procedure: RIGHT KNEE  MEDIAL UNICOMPARTMENTAL ARTHROPLASTY;  Surgeon: Gaynelle Arabian, MD;  Location: [facility];  Service: Orthopedics;  Laterality: Right;  . TONSILLECTOMY AND ADENOIDECTOMY    . TOTAL KNEE ARTHROPLASTY Left 08/27/2017   Procedure: LEFT TOTAL KNEE ARTHROPLASTY;  Surgeon: Gaynelle Arabian, MD;  Location: [facility];  Service: Orthopedics;  Laterality: Left;  . WISDOM TOOTH EXTRACTION     Social History   Socioeconomic History  . Marital status: Married    Spouse name: Not on file  . Number of children: Not on file  . Years of education: Not on file  . Highest education level: Not on file  Occupational History  . Occupation: Retired  Tobacco Use  . Smoking status: Never Smoker  . Smokeless tobacco: Never Used  Vaping Use  . Vaping Use: Never used  Substance and Sexual Activity  . Alcohol use: Yes    Alcohol/week: 0.0 standard drinks    Comment:  very rarely  . Drug use: No  . Sexual activity: Yes    Birth control/protection: Post-menopausal  Other Topics Concern  . Not on file  Social History Narrative  . Not on file   Social Determinants of Health   Financial Resource Strain: Low Risk   . Difficulty of Paying Living Expenses: Not hard at all  Food Insecurity: No Food Insecurity  .  Worried About Charity fundraiser in the Last Year: Never true  . Ran Out of Food in the Last Year: Never true  Transportation Needs: No Transportation Needs  . Lack of Transportation (Medical): No  . Lack of Transportation (Non-Medical): No  Physical Activity: Inactive  . Days of Exercise per Week: 0 days  . Minutes of Exercise per Session: 0 min  Stress: No Stress Concern Present  . Feeling of Stress : Not at all  Social Connections: Socially Integrated  . Frequency of Communication with Friends and Family: More than three times a week  . Frequency of Social Gatherings with Friends and Family: More than three times a week  . Attends Religious Services: More than 4 times per year  . Active Member of Clubs  or Organizations: Yes  . Attends Archivist Meetings: More than 4 times per year  . Marital Status: Married   Family History  Problem Relation Age of Onset  . Osteoporosis Mother   . Thyroid disease Mother   . Rheum arthritis Mother   . Stroke Maternal Aunt         X 3   . Diabetes Maternal Aunt   . Heart attack Maternal Grandfather        > 70  . Melanoma Brother   . Multiple sclerosis Daughter 6  . Deep vein thrombosis Paternal Aunt   . Other Paternal Aunt        Phlebitis without deep venous thrombosis  . Hypertension Father   . Alcohol abuse Father   . Liver disease Father   . Cancer Maternal Grandmother   . Depression Daughter   . Bipolar disorder Daughter   . Colon cancer Neg Hx   . Colon polyps Neg Hx   . Esophageal cancer Neg Hx   . Rectal cancer Neg Hx   . Stomach cancer Neg Hx    Allergies  Allergen Reactions  . Cefaclor Rash    Because of a history of documented adverse serious drug reaction;Medi Alert bracelet  is recommended  . Penicillins     REACTION: rash Because of a history of documented adverse serious drug reaction;Medi Alert bracelet  is recommended Has patient had a PCN reaction causing immediate rash, facial/tongue/throat swelling, SOB or lightheadedness with hypotension: no Has patient had a PCN reaction causing severe rash involving mucus membranes or skin necrosis: {no Has patient had a PCN reaction that required hospitalization no Has patient had a PCN reaction occurring within the last 10 years: no If all of the above ans  . Sulfa Antibiotics Rash    01/22/13 after 9 days of Sulfa Because of a history of documented adverse serious drug reaction;Medi Alert bracelet  is recommended  . Advair Diskus [Fluticasone-Salmeterol]     "Felt weird" and flushing.   Prior to Admission medications   Medication Sig Start Date End Date Taking? Authorizing Provider  azithromycin (ZITHROMAX) 250 MG tablet 2 tabs on day 1, 1 tab on day 2-5 10/29/20    Midge Minium, MD  cetirizine (ZYRTEC) 10 MG tablet Take 10 mg by mouth daily as needed for allergies.    [provider]  cholecalciferol (VITAMIN D) 1000 units tablet Take 1,000 Units by mouth daily.    [provider]  citalopram (CELEXA) 20 MG tablet  04/17/19   [provider]  levothyroxine (SYNTHROID) 112 MCG tablet TAKE 1 TABLET DAILY EXCEPT FOR 1 AND 1/2 TABLETS ON [facility] 08/31/20   Midge Minium,  MD  metoprolol tartrate (LOPRESSOR) 25 MG tablet TAKE 0.5 TABLETS (12.5 MG TOTAL) BY MOUTH 2 (TWO) TIMES DAILY. 11/09/20   Midge Minium, MD  pantoprazole (PROTONIX) 20 MG tablet TAKE 1 TABLET BY MOUTH EVERY DAY 06/21/20   Midge Minium, MD  predniSONE (DELTASONE) 10 MG tablet 3 tabs x3 days and then 2 tabs x3 days and then 1 tab x3 days.  Take w/ food. 10/29/20   Midge Minium, MD  spironolactone (ALDACTONE) 25 MG tablet TAKE 1 TABLET BY MOUTH EVERY DAY 11/17/20   Midge Minium, MD     Positive ROS: Otherwise negative  All other systems have been reviewed and were otherwise negative with the exception of those mentioned in the HPI and as above.  Physical Exam: Constitutional: Alert, well-appearing, no acute distress Ears: External ears without lesions or tenderness.  Right TM is clear.  Left TM reveals a serous otitis media.  After discussion with the patient I went had performed a left M&T in the office using phenol.  Myringotomy was made in the anterior inferior portion of the TM a serous effusion was aspirated and a palpable type I tube was inserted without any difficulty.  Was able to insufflate air via the section tube out the myringotomy tube.  Hearing was improved. Nasal: External nose without lesions. Septum relatively midline. Clear nasal passages otherwise with no signs of infection. Oral: Lips and gums without lesions. Tongue and palate mucosa without lesions. Posterior oropharynx clear. Neck: No palpable adenopathy or  masses Respiratory: Breathing comfortably  Skin: No facial/neck lesions or rash noted.  Myringotomy  Date/Time: 01/05/2021 12:14 PM Performed by: Rozetta Nunnery, MD Authorized by: Rozetta Nunnery, MD   Consent:    Consent obtained:  Verbal   Consent given by:  Patient   Risks discussed:  Bleeding and pain   Alternatives discussed:  No treatment Pre-procedure details:    Indications: serous otitis media   Anesthesia:    Anesthesia method:  Topical application   Topical anesthetic:  Phenol Procedure Details:    Location:  Left TM   Hole made in:  Anteroinferior aspect of TM   Tube:  Paparella Type 1 Findings:    Fluid:  Serous fluid Post-procedure details:    Patient tolerance of procedure:  Tolerated well, no immediate complications Comments:     Myringotomy was made in the anterior inferior portion of the left TM.  A serous effusion was aspirated.  A palpable type I tube was inserted.    Assessment: Chronic left serous otitis media with conductive hearing loss.  Plan: Performed a left M&T in the office using a palpable type I tube. Recommended regular use of nasal steroid spray. She will follow-up in 2 weeks for recheck. Gave her a prescription for Ciprodex to use if she develops any persistent drainage from the ear.   Radene Journey, MD

## 2021-01-19 ENCOUNTER — Ambulatory Visit (INDEPENDENT_AMBULATORY_CARE_PROVIDER_SITE_OTHER): Payer: Medicare PPO | Admitting: Otolaryngology

## 2021-01-19 ENCOUNTER — Other Ambulatory Visit: Payer: Self-pay

## 2021-01-19 DIAGNOSIS — Z4889 Encounter for other specified surgical aftercare: Secondary | ICD-10-CM

## 2021-01-19 NOTE — Progress Notes (Signed)
HPI: Kristine Olson is a 75 y.o. female who presents 2 weeks s/p placement of a left palpable type I tube for chronic left serous otitis media.  Her hearing is doing much better.  Since the change in weather she feels like she has some slight discomfort in the left ear.  But she has had no drainage from the ear..   Past Medical History:  Diagnosis Date  . Acute pyloric channel ulcer with hemorrhage on Xarelto   . Anemia    history of anemia after bleeding ulcer  . Arthritis    ostearthritis - right  medial knee  . Cataract    beginning cataracts  . Complication of anesthesia    was given a medication while waiting to go to surgery that caused burning sensation on scalp was given another medication and the burning went away  . Depression   . GERD (gastroesophageal reflux disease)    "heartburn daily"-TUMS helpful  . History of blood transfusion    2 units PRBC  . Hx of sinus tachycardia    rarely any problems since metoprolol use  . Hyperlipidemia    patient unaware, has never taking medication  . Hypertension   . Hypothyroidism   . Osteoporosis    osteopenia  . Sleep apnea    uses c pap  . Varicose vein of leg    Past Surgical History:  Procedure Laterality Date  . BIOPSY BREAST     2 biopsies & 2 aspirations  . CESAREAN SECTION    . CLOSED REDUCTION TOE FRACTURE    . COLONOSCOPY  2015  . COLONOSCOPY W/ POLYPECTOMY  multiple since 2007   adenomas, diverticulosis;last 2015  . ESOPHAGOGASTRODUODENOSCOPY (EGD) WITH PROPOFOL N/A 05/29/2016   Procedure: ESOPHAGOGASTRODUODENOSCOPY (EGD) WITH PROPOFOL;  Surgeon: Gatha Mayer, MD;  Location: [facility];  Service: Endoscopy;  Laterality: N/A;  . HAMMER TOE SURGERY    . PARTIAL KNEE ARTHROPLASTY Right 05/22/2016   Procedure: RIGHT KNEE MEDIAL UNICOMPARTMENTAL ARTHROPLASTY;  Surgeon: Gaynelle Arabian, MD;  Location: [facility];  Service: Orthopedics;  Laterality: Right;  . TONSILLECTOMY AND ADENOIDECTOMY    . TOTAL KNEE ARTHROPLASTY  Left 08/27/2017   Procedure: LEFT TOTAL KNEE ARTHROPLASTY;  Surgeon: Gaynelle Arabian, MD;  Location: [facility];  Service: Orthopedics;  Laterality: Left;  . WISDOM TOOTH EXTRACTION     Social History   Socioeconomic History  . Marital status: Married    Spouse name: Not on file  . Number of children: Not on file  . Years of education: Not on file  . Highest education level: Not on file  Occupational History  . Occupation: Retired  Tobacco Use  . Smoking status: Never Smoker  . Smokeless tobacco: Never Used  Vaping Use  . Vaping Use: Never used  Substance and Sexual Activity  . Alcohol use: Yes    Alcohol/week: 0.0 standard drinks    Comment:  very rarely  . Drug use: No  . Sexual activity: Yes    Birth control/protection: Post-menopausal  Other Topics Concern  . Not on file  Social History Narrative  . Not on file   Social Determinants of Health   Financial Resource Strain: Low Risk   . Difficulty of Paying Living Expenses: Not hard at all  Food Insecurity: No Food Insecurity  . Worried About Charity fundraiser in the Last Year: Never true  . Ran Out of Food in the Last Year: Never true  Transportation Needs: No Transportation Needs  .  Lack of Transportation (Medical): No  . Lack of Transportation (Non-Medical): No  Physical Activity: Inactive  . Days of Exercise per Week: 0 days  . Minutes of Exercise per Session: 0 min  Stress: No Stress Concern Present  . Feeling of Stress : Not at all  Social Connections: Socially Integrated  . Frequency of Communication with Friends and Family: More than three times a week  . Frequency of Social Gatherings with Friends and Family: More than three times a week  . Attends Religious Services: More than 4 times per year  . Active Member of Clubs or Organizations: Yes  . Attends Archivist Meetings: More than 4 times per year  . Marital Status: Married   Family History  Problem Relation Age of Onset  . Osteoporosis Mother    . Thyroid disease Mother   . Rheum arthritis Mother   . Stroke Maternal Aunt         X 3   . Diabetes Maternal Aunt   . Heart attack Maternal Grandfather        > 70  . Melanoma Brother   . Multiple sclerosis Daughter 8  . Deep vein thrombosis Paternal Aunt   . Other Paternal Aunt        Phlebitis without deep venous thrombosis  . Hypertension Father   . Alcohol abuse Father   . Liver disease Father   . Cancer Maternal Grandmother   . Depression Daughter   . Bipolar disorder Daughter   . Colon cancer Neg Hx   . Colon polyps Neg Hx   . Esophageal cancer Neg Hx   . Rectal cancer Neg Hx   . Stomach cancer Neg Hx    Allergies  Allergen Reactions  . Cefaclor Rash    Because of a history of documented adverse serious drug reaction;Medi Alert bracelet  is recommended  . Penicillins     REACTION: rash Because of a history of documented adverse serious drug reaction;Medi Alert bracelet  is recommended Has patient had a PCN reaction causing immediate rash, facial/tongue/throat swelling, SOB or lightheadedness with hypotension: no Has patient had a PCN reaction causing severe rash involving mucus membranes or skin necrosis: {no Has patient had a PCN reaction that required hospitalization no Has patient had a PCN reaction occurring within the last 10 years: no If all of the above ans  . Sulfa Antibiotics Rash    01/22/13 after 9 days of Sulfa Because of a history of documented adverse serious drug reaction;Medi Alert bracelet  is recommended  . Advair Diskus [Fluticasone-Salmeterol]     "Felt weird" and flushing.   Prior to Admission medications   Medication Sig Start Date End Date Taking? Authorizing Provider  azithromycin (ZITHROMAX) 250 MG tablet 2 tabs on day 1, 1 tab on day 2-5 10/29/20   Midge Minium, MD  cetirizine (ZYRTEC) 10 MG tablet Take 10 mg by mouth daily as needed for allergies.    [provider]  cholecalciferol (VITAMIN D) 1000 units tablet Take  1,000 Units by mouth daily.    [provider]  citalopram (CELEXA) 20 MG tablet  04/17/19   [provider]  levothyroxine (SYNTHROID) 112 MCG tablet TAKE 1 TABLET DAILY EXCEPT FOR 1 AND 1/2 TABLETS ON [facility] 08/31/20   Midge Minium, MD  metoprolol tartrate (LOPRESSOR) 25 MG tablet TAKE 0.5 TABLETS (12.5 MG TOTAL) BY MOUTH 2 (TWO) TIMES DAILY. 11/09/20   Midge Minium, MD  pantoprazole (PROTONIX) 20 MG  tablet TAKE 1 TABLET BY MOUTH EVERY DAY 06/21/20   Midge Minium, MD  predniSONE (DELTASONE) 10 MG tablet 3 tabs x3 days and then 2 tabs x3 days and then 1 tab x3 days.  Take w/ food. 10/29/20   Midge Minium, MD  spironolactone (ALDACTONE) 25 MG tablet TAKE 1 TABLET BY MOUTH EVERY DAY 11/17/20   Midge Minium, MD     Physical Exam: The Paparella type I tube is patent and dry and the TM is clear.  There is slight crusting around the tube but this is minimal.  Ear canals clear.  She has a little bit of wax in the outer portion of the ear canal that was removed with a curette.   Assessment: S/p placement of left palpable type I tube for chronic left serous otitis media.  She is doing well.  Plan: Reviewed with her concerning the myringotomy tube.  We will keep water any drops out of the left ear.  It is okay to clean the outer portion of the ear canal with her finger or curette. Discussed with her that this will probably extrude in 8 to 46months. Suggested use of nasal steroid spray if she has much congestion in her nose sinuses or eustachian tube region.   Radene Journey, MD

## 2021-01-31 ENCOUNTER — Telehealth: Payer: Self-pay

## 2021-01-31 DIAGNOSIS — F411 Generalized anxiety disorder: Secondary | ICD-10-CM | POA: Diagnosis not present

## 2021-01-31 NOTE — Telephone Encounter (Signed)
Pt wants to see neurologist who specializes in movement disorders

## 2021-01-31 NOTE — Telephone Encounter (Signed)
Pt called stating she would like to speak to Dr. Birdie Riddle about a referral to a neurologist. Pt is looking for a neurologist who specifically specializes in movement disorders.

## 2021-02-01 NOTE — Telephone Encounter (Signed)
Dr Tat ([facility] neuro) specializes in movement disorders but we need a diagnosis or a problem so that we can make the referral.  What does she want to be seen for?

## 2021-02-01 NOTE — Telephone Encounter (Signed)
Pt will need an appointment to have a referral put in for Neuro for her movement issue.

## 2021-02-02 ENCOUNTER — Encounter: Payer: Self-pay | Admitting: Family Medicine

## 2021-02-03 ENCOUNTER — Ambulatory Visit: Payer: Medicare PPO | Admitting: Family Medicine

## 2021-02-03 ENCOUNTER — Other Ambulatory Visit: Payer: Self-pay

## 2021-02-03 ENCOUNTER — Encounter: Payer: Self-pay | Admitting: Family Medicine

## 2021-02-03 VITALS — BP 115/80 | HR 67 | Temp 97.8°F | Resp 19 | Ht 63.0 in | Wt 210.8 lb

## 2021-02-03 DIAGNOSIS — Z0000 Encounter for general adult medical examination without abnormal findings: Secondary | ICD-10-CM

## 2021-02-03 DIAGNOSIS — R251 Tremor, unspecified: Secondary | ICD-10-CM

## 2021-02-03 DIAGNOSIS — M8580 Other specified disorders of bone density and structure, unspecified site: Secondary | ICD-10-CM

## 2021-02-03 DIAGNOSIS — E559 Vitamin D deficiency, unspecified: Secondary | ICD-10-CM

## 2021-02-03 NOTE — Assessment & Plan Note (Signed)
Due for repeat DEXA.  Order placed.  Check Vit D.

## 2021-02-03 NOTE — Assessment & Plan Note (Signed)
Check labs and replete prn. 

## 2021-02-03 NOTE — Assessment & Plan Note (Signed)
Pt's BMI is 37.34 and combined w/ her other medical issues, this qualifies as morbid obesity.  Encouraged healthy diet and regular exercise.  Check labs to risk stratify.  Will follow.

## 2021-02-03 NOTE — Assessment & Plan Note (Signed)
Pt's PE WNL w/ exception of obesity and a very fine tremor of L hand when she approximates her thumb to the other fingers.  UTD on mammo, colonoscopy, immunizations.  Check labs.  Anticipatory guidance provided.

## 2021-02-03 NOTE — Patient Instructions (Signed)
Follow up in 6 months to recheck BP We'll notify you of your lab results and make any changes if needed Continue to work on healthy diet and regular exercise- you can do it! We'll call you to schedule your bone density appt We'll call you with your Neurology referral Call with any questions or concerns Happy Easter!!!

## 2021-02-03 NOTE — Progress Notes (Signed)
Subjective:    Patient ID: Kristine Olson, female    DOB: 25-Jan-1946, 75 y.o.   MRN: [id number]  HPI CPE- UTD on colonoscopy, mammo, COVID, Tdap, flu.  Reviewed past medical, surgical, family and social histories.   Health Maintenance  Topic Date Due  . DEXA SCAN  08/30/2018  . Hepatitis C Screening  02/03/2022 (Originally [birth year])  . INFLUENZA VACCINE  05/23/2021  . MAMMOGRAM  02/22/2022  . TETANUS/TDAP  10/04/2022  . COLONOSCOPY (Pts 45-42yrs Insurance coverage will need to be confirmed)  03/05/2025  . COVID-19 Vaccine  Completed  . PNA vac Low Risk Adult  Completed  . HPV VACCINES  Aged Out    Patient Care Team    Relationship Specialty Notifications Start End  Midge Minium, MD PCP - General Family Medicine  03/02/16   Charlynn Grimes, MD Referring Physician Obstetrics and Gynecology  04/17/16   Gatha Mayer, MD Consulting Physician Gastroenterology  04/17/16   Marvia Pickles  Psychiatry  04/20/17   Gaynelle Arabian, MD Consulting Physician Orthopedic Surgery  04/20/17   Deneise Lever, MD Consulting Physician Pulmonary Disease  04/20/17   Minus Breeding, MD Consulting Physician Cardiology  04/24/18       Review of Systems Patient reports no vision/ hearing changes, adenopathy,fever, weight change,  persistant/recurrent hoarseness , swallowing issues, chest pain, palpitations, edema, persistant/recurrent cough, hemoptysis, dyspnea (rest/exertional/paroxysmal nocturnal), gastrointestinal bleeding (melena, rectal bleeding), abdominal pain, significant heartburn, bowel changes, GU symptoms (dysuria, hematuria, incontinence), Gyn symptoms (abnormal  bleeding, pain),  syncope, focal weakness, memory loss, numbness & tingling, skin/hair/nail changes, abnormal bruising or bleeding, anxiety, or depression.   + tremor- pt sees psych 3-4x/year for med checks.  Pt reports she prefers to use a handrail when possible due to decreased balance.  Tremor tends to only occur when she  is putting her fingers to thumb on L hand.  No tremor at rest, no difficulty carrying coffee cup, able to eat soup w/o difficulty  This visit occurred during the SARS-CoV-2 public health emergency.  Safety protocols were in place, including screening questions prior to the visit, additional usage of staff PPE, and extensive cleaning of exam room while observing appropriate contact time as indicated for disinfecting solutions.       Objective:   Physical Exam General Appearance:    Alert, cooperative, no distress, appears stated age, obese  Head:    Normocephalic, without obvious abnormality, atraumatic  Eyes:    PERRL, conjunctiva/corneas clear, EOM's intact, fundi    benign, both eyes  Ears:    Normal TM's and external ear canals, both ears  Nose:   Deferred due to COVID  Throat:   Neck:   Supple, symmetrical, trachea midline, no adenopathy;    Thyroid: no enlargement/tenderness/nodules  Back:     Symmetric, no curvature, ROM normal, no CVA tenderness  Lungs:     Clear to auscultation bilaterally, respirations unlabored  Chest Wall:    No tenderness or deformity   Heart:    Regular rate and rhythm, S1 and S2 normal, no murmur, rub   or gallop  Breast Exam:    Deferred to mammo  Abdomen:     Soft, non-tender, bowel sounds active all four quadrants,    no masses, no organomegaly  Genitalia:    Deferred  Rectal:    Extremities:   Extremities normal, atraumatic, no cyanosis or edema  Pulses:   2+ and symmetric all extremities  Skin:   Skin color, texture, turgor  normal, no rashes or lesions  Lymph nodes:   Cervical, supraclavicular, and axillary nodes normal  Neurologic:   CNII-XII intact, normal strength, sensation and reflexes    Throughout.  Very fine tremor of L hand when approximating thumb to other fingers          Assessment & Plan:

## 2021-02-04 LAB — LIPID PANEL
Cholesterol: 181 mg/dL (ref <200)
HDL: 73 mg/dL (ref >=50)
LDL Cholesterol (Calc): 86 mg/dL (calc)
Non-HDL Cholesterol (Calc): 108 mg/dL (calc) (ref <130)
Total CHOL/HDL Ratio: 2.5 (calc) (ref <5.0)
Triglycerides: 122 mg/dL (ref <150)

## 2021-02-04 LAB — CBC WITH DIFFERENTIAL/PLATELET
Absolute Monocytes: 687 cells/uL (ref 200–950)
Basophils Absolute: 70 cells/uL (ref 0–200)
Basophils Relative: 0.8 %
Eosinophils Absolute: 252 cells/uL (ref 15–500)
Eosinophils Relative: 2.9 %
HCT: 36 % (ref 35.0–45.0)
Hemoglobin: 11.3 g/dL — ABNORMAL LOW (ref 11.7–15.5)
Lymphs Abs: 1853 cells/uL (ref 850–3900)
MCH: 25.5 pg — ABNORMAL LOW (ref 27.0–33.0)
MCHC: 31.4 g/dL — ABNORMAL LOW (ref 32.0–36.0)
MCV: 81.3 fL (ref 80.0–100.0)
MPV: 12 fL (ref 7.5–12.5)
Monocytes Relative: 7.9 %
Neutro Abs: 5838 cells/uL (ref 1500–7800)
Neutrophils Relative %: 67.1 %
Platelets: 275 10*3/uL (ref 140–400)
RBC: 4.43 10*6/uL (ref 3.80–5.10)
RDW: 14.2 % (ref 11.0–15.0)
Total Lymphocyte: 21.3 %
WBC: 8.7 10*3/uL (ref 3.8–10.8)

## 2021-02-04 LAB — TSH: TSH: 0.57 mIU/L (ref 0.40–4.50)

## 2021-02-04 LAB — HEPATIC FUNCTION PANEL
AG Ratio: 1.4 (calc) (ref 1.0–2.5)
ALT: 13 U/L (ref 6–29)
AST: 18 U/L (ref 10–35)
Albumin: 4.2 g/dL (ref 3.6–5.1)
Alkaline phosphatase (APISO): 64 U/L (ref 37–153)
Bilirubin, Direct: 0.1 mg/dL (ref 0.0–0.2)
Globulin: 2.9 g/dL (calc) (ref 1.9–3.7)
Indirect Bilirubin: 0.3 mg/dL (calc) (ref 0.2–1.2)
Total Bilirubin: 0.4 mg/dL (ref 0.2–1.2)
Total Protein: 7.1 g/dL (ref 6.1–8.1)

## 2021-02-04 LAB — BASIC METABOLIC PANEL
BUN/Creatinine Ratio: 13 (calc) (ref 6–22)
BUN: 18 mg/dL (ref 7–25)
CO2: 26 mmol/L (ref 20–32)
Calcium: 9.5 mg/dL (ref 8.6–10.4)
Chloride: 102 mmol/L (ref 98–110)
Creat: 1.36 mg/dL — ABNORMAL HIGH (ref 0.60–0.93)
Glucose, Bld: 93 mg/dL (ref 65–99)
Potassium: 4 mmol/L (ref 3.5–5.3)
Sodium: 140 mmol/L (ref 135–146)

## 2021-02-04 LAB — VITAMIN D 25 HYDROXY (VIT D DEFICIENCY, FRACTURES): Vit D, 25-Hydroxy: 36 ng/mL (ref 30–100)

## 2021-02-09 ENCOUNTER — Other Ambulatory Visit: Payer: Self-pay

## 2021-02-09 DIAGNOSIS — R7989 Other specified abnormal findings of blood chemistry: Secondary | ICD-10-CM

## 2021-02-11 NOTE — Progress Notes (Signed)
Assessment/Plan:   1.  Tremor  -I do think that this is a form of essential tremor.  She does have some tremor of the outstretched hands on both sides, left greater than right.  We discussed nature and pathophysiology of that.  I really do not think she needs any medication, nor does she.  The worry was that this was coming from the citalopram and I do not think it is.  I also do not see any form of a neurodegenerative condition such as Parkinson's disease and reassured her about that.  She is to let me know should things change or worsen.  Otherwise, she will follow-up with me on an as-needed basis.  Subjective:   Kristine Olson was seen today in the movement disorders clinic for neurologic consultation at the request of Tabori, Aundra Millet, MD.  The consultation is for the evaluation of tremor.  Tremor: Yes.     How long has it been going on? Months to years, but has been intermittent  At rest or with activation?  Activation, occurs when putting approximating the fingers to the thumb (opposition) on the left hand  -states that psychiatrist told her to get it checked out after mentioning to her.  Pt on celexa since at least 2003.  She sees Dr. Marvia Pickles, MD in [place].  Fam hx of tremor?  Yes.  , daughter has bipolar d/o and has tremor from med  Located where?  Left hand  Affected by caffeine:  No.  Affected by alcohol: doesn't drink enough to know   Affected by stress:  No. and she is R hand dominant  Affected by fatigue:  No.  Spills soup if on spoon:  No.  Spills glass of liquid if full:  No.  Affects ADL's (tying shoes, brushing teeth, etc):  No. but does have trouble picking up buttons/coins due to dexterity issues  Tremor inducing meds:  No.,  But does see psychiatry.  Only on Celexa.  Other Specific Symptoms:  Voice: no change Sleep: sleeps well  Vivid Dreams:  Yes.    Acting out dreams:  No. Wet Pillows: No., uses cpap Postural symptoms: minor  Falls?   No. Bradykinesia symptoms: difficulty getting out of a chair (due to knees - s/p bilateral TKA);  Loss of smell:  No. Loss of taste:  No. Urinary Incontinence: wears pad Difficulty Swallowing:  No. Handwriting, micrographia: No. Depression:  No. Memory changes:  No.  N/V:  No. Lightheaded:  rarely  Syncope: No. Diplopia:  No. Dyskinesia:  No.  Neuroimaging of the brain has not previously been performed.      ALLERGIES:   Allergies  Allergen Reactions  . Cefaclor Rash    Because of a history of documented adverse serious drug reaction;Medi Alert bracelet  is recommended  . Penicillins     REACTION: rash Because of a history of documented adverse serious drug reaction;Medi Alert bracelet  is recommended Has patient had a PCN reaction causing immediate rash, facial/tongue/throat swelling, SOB or lightheadedness with hypotension: no Has patient had a PCN reaction causing severe rash involving mucus membranes or skin necrosis: {no Has patient had a PCN reaction that required hospitalization no Has patient had a PCN reaction occurring within the last 10 years: no If all of the above ans  . Sulfa Antibiotics Rash    01/22/13 after 9 days of Sulfa Because of a history of documented adverse serious drug reaction;Medi Alert bracelet  is recommended  . Advair Diskus [  Fluticasone-Salmeterol]     "Felt weird" and flushing.    CURRENT MEDICATIONS:  Current Outpatient Medications  Medication Instructions  . cetirizine (ZYRTEC) 10 mg, Daily PRN  . cholecalciferol (VITAMIN D) 1,000 Units, Oral, Daily  . citalopram (CELEXA) 20 mg, Oral, Daily  . levothyroxine (SYNTHROID) 112 MCG tablet TAKE 1 TABLET DAILY EXCEPT FOR 1 AND 1/2 TABLETS ON WEDNESDAY  . metoprolol tartrate (LOPRESSOR) 12.5 mg, Oral, 2 times daily  . pantoprazole (PROTONIX) 20 MG tablet TAKE 1 TABLET BY MOUTH EVERY DAY  . spironolactone (ALDACTONE) 25 MG tablet TAKE 1 TABLET BY MOUTH EVERY DAY    Objective:   PHYSICAL  EXAMINATION:    VITALS:   Vitals:   02/15/21 0847  BP: 112/78  Pulse: 71  SpO2: 96%  Weight: 211 lb (95.7 kg)  Height: 5\' 3"  (1.6 m)    GEN:  The patient appears stated age and is in NAD. HEENT:  Normocephalic, atraumatic.  The mucous membranes are moist. The superficial temporal arteries are without ropiness or tenderness. CV:  RRR Lungs:  CTAB Neck/HEME:  There are no carotid bruits bilaterally.  Neurological examination:  Orientation: The patient is alert and oriented x3.  Cranial nerves: There is good facial symmetry.  Extraocular muscles are intact. The visual fields are full to confrontational testing. The speech is fluent and clear. Soft palate rises symmetrically and there is no tongue deviation. Hearing is intact to conversational tone. Sensation: Sensation is intact to light touch throughout (facial, trunk, extremities). Vibration is intact at the bilateral big toe. There is no extinction with double simultaneous stimulation.  Motor: Strength is 5/5 in the bilateral upper and lower extremities.   Shoulder shrug is equal and symmetric.  There is no pronator drift. Deep tendon reflexes: Deep tendon reflexes are 2/4 at the bilateral biceps, triceps, brachioradialis, patella and achilles. Plantar responses are downgoing bilaterally.  Movement examination: Tone: There is normal tone in the bilateral upper extremities.  The tone in the lower extremities is normal.  Abnormal movements: no rest tremor, even with distraction procedures.  Min postural tremor bilaterally.  She has mild intention tremor, left greater than right.  She shows me that when she approximates all of the fingers to the thumb on the left, she has some tremor.  She is able to control it by thinking about it.  She does not really experience that on the right side.  She is able to pour water from 1 glass to another. Coordination:  There is no decremation with RAM's, with any form of RAMS, including alternating  supination and pronation of the forearm, hand opening and closing, finger taps, heel taps and toe taps. Gait and Station: The patient has no difficulty arising out of a deep-seated chair without the use of the hands. The patient's stride length is good.  I have reviewed and interpreted the following labs independently   Chemistry      Component Value Date/Time   NA 140 02/03/2021 1507   K 4.0 02/03/2021 1507   CL 102 02/03/2021 1507   CO2 26 02/03/2021 1507   BUN 18 02/03/2021 1507   CREATININE 1.36 (H) 02/03/2021 1507      Component Value Date/Time   CALCIUM 9.5 02/03/2021 1507   ALKPHOS 55 04/24/2018 1048   AST 18 02/03/2021 1507   ALT 13 02/03/2021 1507   BILITOT 0.4 02/03/2021 1507      Lab Results  Component Value Date   TSH 0.57 02/03/2021   Lab Results  Component Value Date   WBC 8.7 02/03/2021   HGB 11.3 (L) 02/03/2021   HCT 36.0 02/03/2021   MCV 81.3 02/03/2021   PLT 275 02/03/2021      Total time spent on today's visit was 45 minutes, including both face-to-face time and nonface-to-face time.  Time included that spent on review of records (prior notes available to me/labs/imaging if pertinent), discussing treatment and goals, answering patient's questions and coordinating care.  Cc:  Midge Minium, MD

## 2021-02-15 ENCOUNTER — Ambulatory Visit: Payer: Medicare PPO | Admitting: Neurology

## 2021-02-15 ENCOUNTER — Other Ambulatory Visit: Payer: Self-pay

## 2021-02-15 ENCOUNTER — Encounter: Payer: Self-pay | Admitting: Neurology

## 2021-02-15 VITALS — BP 112/78 | HR 71 | Ht 63.0 in | Wt 211.0 lb

## 2021-02-15 DIAGNOSIS — G250 Essential tremor: Secondary | ICD-10-CM | POA: Diagnosis not present

## 2021-02-15 NOTE — Patient Instructions (Signed)
I don't see anything worrisome about your tremor.  There is no evidence of Parkinsons Disease.  I don't think that this is related to your celexa.  If things get worse or new neurologic symptoms arise, please reach back out to [place].  The physicians and staff at [facility] are committed to providing excellent care. You may receive a survey requesting feedback about your experience at our office. We strive to receive "very good" responses to the survey questions. If you feel that your experience would prevent you from giving the office a "very good " response, please contact our office to try to remedy the situation. We may be reached at [phone]. Thank you for taking the time out of your busy day to complete the survey.

## 2021-03-18 ENCOUNTER — Other Ambulatory Visit: Payer: Self-pay | Admitting: Family Medicine

## 2021-04-05 ENCOUNTER — Other Ambulatory Visit: Payer: Self-pay

## 2021-04-05 ENCOUNTER — Other Ambulatory Visit (INDEPENDENT_AMBULATORY_CARE_PROVIDER_SITE_OTHER): Payer: Medicare PPO

## 2021-04-05 DIAGNOSIS — R7989 Other specified abnormal findings of blood chemistry: Secondary | ICD-10-CM

## 2021-04-05 LAB — BASIC METABOLIC PANEL
BUN: 18 mg/dL (ref 6–23)
CO2: 27 mEq/L (ref 19–32)
Calcium: 9.7 mg/dL (ref 8.4–10.5)
Chloride: 99 mEq/L (ref 96–112)
Creatinine, Ser: 1.54 mg/dL — ABNORMAL HIGH (ref 0.40–1.20)
GFR: 33.45 mL/min — ABNORMAL LOW (ref >60.00)
Glucose, Bld: 78 mg/dL (ref 70–99)
Potassium: 4 mEq/L (ref 3.5–5.1)
Sodium: 137 mEq/L (ref 135–145)

## 2021-04-06 ENCOUNTER — Other Ambulatory Visit: Payer: Self-pay

## 2021-04-06 DIAGNOSIS — R7989 Other specified abnormal findings of blood chemistry: Secondary | ICD-10-CM

## 2021-04-14 ENCOUNTER — Other Ambulatory Visit (INDEPENDENT_AMBULATORY_CARE_PROVIDER_SITE_OTHER): Payer: Medicare PPO

## 2021-04-14 ENCOUNTER — Other Ambulatory Visit: Payer: Self-pay

## 2021-04-14 DIAGNOSIS — R7989 Other specified abnormal findings of blood chemistry: Secondary | ICD-10-CM

## 2021-04-14 LAB — BASIC METABOLIC PANEL
BUN: 15 mg/dL (ref 6–23)
CO2: 28 mEq/L (ref 19–32)
Calcium: 9.5 mg/dL (ref 8.4–10.5)
Chloride: 101 mEq/L (ref 96–112)
Creatinine, Ser: 1.25 mg/dL — ABNORMAL HIGH (ref 0.40–1.20)
GFR: 42.96 mL/min — ABNORMAL LOW (ref >60.00)
Glucose, Bld: 86 mg/dL (ref 70–99)
Potassium: 4.2 mEq/L (ref 3.5–5.1)
Sodium: 136 mEq/L (ref 135–145)

## 2021-04-18 ENCOUNTER — Ambulatory Visit: Payer: Medicare PPO | Admitting: Family Medicine

## 2021-04-18 ENCOUNTER — Other Ambulatory Visit: Payer: Self-pay

## 2021-04-18 ENCOUNTER — Encounter: Payer: Self-pay | Admitting: Family Medicine

## 2021-04-18 VITALS — BP 128/66 | HR 68 | Temp 98.2°F | Resp 16 | Ht 63.0 in | Wt 208.0 lb

## 2021-04-18 DIAGNOSIS — R7989 Other specified abnormal findings of blood chemistry: Secondary | ICD-10-CM | POA: Diagnosis not present

## 2021-04-18 DIAGNOSIS — M5441 Lumbago with sciatica, right side: Secondary | ICD-10-CM

## 2021-04-18 DIAGNOSIS — M5126 Other intervertebral disc displacement, lumbar region: Secondary | ICD-10-CM

## 2021-04-18 MED ORDER — PREDNISONE 20 MG PO TABS: ORAL_TABLET | ORAL | 0 refills | Status: DC

## 2021-04-18 MED ORDER — CYCLOBENZAPRINE HCL 5 MG PO TABS
5.0000 mg | ORAL_TABLET | Freq: Three times a day (TID) | ORAL | 1 refills | Status: DC | PRN
Start: 2021-04-18 — End: 2021-09-05

## 2021-04-18 NOTE — Patient Instructions (Addendum)
I suspect you may have a flair of the herniated disk.  Okay to try heat in the morning, ice as needed throughout the day for spasm or pain.  Tylenol is fine to use for pain throughout the day as well.  Avoid NSAIDs like Advil or Aleve.  Can try muscle relaxant initially at bedtime (start with 1/2 pill if possible), watch for sedation, dizziness, risk of falling.  Stop that medication if those occur.  If that medicine is tolerated, it can be taken up to 3 times per day.  If not improving in the next few days, can start prednisone as we discussed.  If it is still not improved with prednisone, then follow-up to decide on further imaging/treatment.  Good luck and let me know if there are questions.   Return to the clinic or go to the nearest emergency room if any of your symptoms worsen or new symptoms occur.  Acute Back Pain, Adult Acute back pain is sudden and usually short-lived. It is often caused by an injury to the muscles and tissues in the back. The injury may result from: A muscle or ligament getting overstretched or torn (strained). Ligaments are tissues that connect bones to each other. Lifting something improperly can cause a back strain. Wear and tear (degeneration) of the spinal disks. Spinal disks are circular tissue that provide cushioning between the bones of the spine (vertebrae). Twisting motions, such as while playing sports or doing yard work. A hit to the back. Arthritis. You may have a physical exam, lab tests, and imaging tests to find the cause ofyour pain. Acute back pain usually goes away with rest and home care. Follow these instructions at home: Managing pain, stiffness, and swelling Treatment may include medicines for pain and inflammation that are taken by mouth or applied to the skin, prescription pain medicine, or muscle relaxants. Take over-the-counter and prescription medicines only as told by your health care provider. Your health care provider may recommend applying  ice during the first 24-48 hours after your pain starts. To do this: Put ice in a plastic bag. Place a towel between your skin and the bag. Leave the ice on for 20 minutes, 2-3 times a day. If directed, apply heat to the affected area as often as told by your health care provider. Use the heat source that your health care provider recommends, such as a moist heat pack or a heating pad. Place a towel between your skin and the heat source. Leave the heat on for 20-30 minutes. Remove the heat if your skin turns bright red. This is especially important if you are unable to feel pain, heat, or cold. You have a greater risk of getting burned. Activity  Do not stay in bed. Staying in bed for more than 1-2 days can delay your recovery. Sit up and stand up straight. Avoid leaning forward when you sit or hunching over when you stand. If you work at a desk, sit close to it so you do not need to lean over. Keep your chin tucked in. Keep your neck drawn back, and keep your elbows bent at a 90-degree angle (right angle). Sit high and close to the steering wheel when you drive. Add lower back (lumbar) support to your car seat, if needed. Take short walks on even surfaces as soon as you are able. Try to increase the length of time you walk each day. Do not sit, drive, or stand in one place for more than 30 minutes at  a time. Sitting or standing for long periods of time can put stress on your back. Do not drive or use heavy machinery while taking prescription pain medicine. Use proper lifting techniques. When you bend and lift, use positions that put less stress on your back: Rotonda your knees. Keep the load close to your body. Avoid twisting. Exercise regularly as told by your health care provider. Exercising helps your back heal faster and helps prevent back injuries by keeping muscles strong and flexible. Work with a physical therapist to make a safe exercise program, as recommended by your health care  provider. Do any exercises as told by your physical therapist.  Lifestyle Maintain a healthy weight. Extra weight puts stress on your back and makes it difficult to have good posture. Avoid activities or situations that make you feel anxious or stressed. Stress and anxiety increase muscle tension and can make back pain worse. Learn ways to manage anxiety and stress, such as through exercise. General instructions Sleep on a firm mattress in a comfortable position. Try lying on your side with your knees slightly bent. If you lie on your back, put a pillow under your knees. Follow your treatment plan as told by your health care provider. This may include: Cognitive or behavioral therapy. Acupuncture or massage therapy. Meditation or yoga. Contact a health care provider if: You have pain that is not relieved with rest or medicine. You have increasing pain going down into your legs or buttocks. Your pain does not improve after 2 weeks. You have pain at night. You lose weight without trying. You have a fever or chills. Get help right away if: You develop new bowel or bladder control problems. You have unusual weakness or numbness in your arms or legs. You develop nausea or vomiting. You develop abdominal pain. You feel faint. Summary Acute back pain is sudden and usually short-lived. Use proper lifting techniques. When you bend and lift, use positions that put less stress on your back. Take over-the-counter and prescription medicines and apply heat or ice as directed by your health care provider. This information is not intended to replace advice given to you by your health care provider. Make sure you discuss any questions you have with your healthcare provider. Document Revised: 06/29/2020 Document Reviewed: 07/02/2020 Elsevier Patient Education  2022 [employer].

## 2021-04-18 NOTE — Progress Notes (Signed)
Subjective:  Patient ID: Kristine Olson, female    DOB: 03-Jul-1946  Age: 75 y.o. MRN: [id number]  CC:  Chief Complaint  Patient presents with   Back Pain    Pt reports pain in lower back, into right leg, pt reports aching pain, sometimes relived with laying down resting started Friday     HPI Kristine Olson presents for  Low back pain: Right lower back pain with radiation into right leg.  Some milder midline low back pain past week or so. Improved then worse 3 days ago.sitting for a prolonged period of time with bending forward to pick up pieces.no specific injury, but more sore after this activity.  Pain moves down R side of thigh to R calf. No bowel or bladder incontinence, no saddle anesthesia, no lower extremity weakness.  No dysuria or hematuria.  No rash.   Attempted treatments: Lying down provides some relief, tylenol few times at bedtime. Not sure of benefit.  Avoiding nsaids. Has tolerated mm relaxants.   Lumbar spine MRI noted from 2003 -disc herniation on the right side at L2-3 affecting the L2 nerve root. Of note she has recently had elevated creatinine, avoidance of NSAIDs recommended by primary care provider.  Improvement from 1.54-1.25 from June 14 through June 23.   History Patient Active Problem List   Diagnosis Date Noted   Morbid obesity due to excess calories ([place]) 10/31/2017   Anemia, iron deficiency 05/29/2016   OA (osteoarthritis) of knee 05/22/2016   Physical exam 04/17/2016   Hemorrhoids 12/03/2015   Elevated serum creatinine 11/27/2014   OSA (obstructive sleep apnea) 03/28/2013   HTN (hypertension) 03/06/2013   Positional vertigo 10/18/2012   Fasting hyperglycemia 08/23/2012   Vitamin D deficiency 08/23/2012   Osteopenia 07/06/2011   POSTMENOPAUSAL SYNDROME 03/01/2010   GERD (gastroesophageal reflux disease) 03/05/2009   Hypothyroidism 11/20/2007   IBS 11/20/2007   TACHYCARDIA, HX OF 11/20/2007   History of colonic polyps 11/20/2007    Past Medical History:  Diagnosis Date   Acute pyloric channel ulcer with hemorrhage on Xarelto    Anemia    history of anemia after bleeding ulcer   Arthritis    ostearthritis - right  medial knee   Cataract    beginning cataracts   Complication of anesthesia    was given a medication while waiting to go to surgery that caused burning sensation on scalp was given another medication and the burning went away   Depression    GERD (gastroesophageal reflux disease)    "heartburn daily"-TUMS helpful   History of blood transfusion    2 units PRBC   Hx of sinus tachycardia    rarely any problems since metoprolol use   Hyperlipidemia    patient unaware, has never taking medication   Hypertension    Hypothyroidism    Osteoporosis    osteopenia   Sleep apnea    uses c pap   Varicose vein of leg    Past Surgical History:  Procedure Laterality Date   BIOPSY BREAST     2 biopsies & 2 aspirations   CESAREAN SECTION     CLOSED REDUCTION TOE FRACTURE     COLONOSCOPY  2015   COLONOSCOPY W/ POLYPECTOMY  multiple since 2007   adenomas, diverticulosis;last 2015   ESOPHAGOGASTRODUODENOSCOPY (EGD) WITH PROPOFOL N/A 05/29/2016   Procedure: ESOPHAGOGASTRODUODENOSCOPY (EGD) WITH PROPOFOL;  Surgeon: Gatha Mayer, MD;  Location: [facility];  Service: Endoscopy;  Laterality: N/A;   HAMMER TOE SURGERY  PARTIAL KNEE ARTHROPLASTY Right 05/22/2016   Procedure: RIGHT KNEE MEDIAL UNICOMPARTMENTAL ARTHROPLASTY;  Surgeon: Gaynelle Arabian, MD;  Location: [facility];  Service: Orthopedics;  Laterality: Right;   TONSILLECTOMY AND ADENOIDECTOMY     TOTAL KNEE ARTHROPLASTY Left 08/27/2017   Procedure: LEFT TOTAL KNEE ARTHROPLASTY;  Surgeon: Gaynelle Arabian, MD;  Location: [facility];  Service: Orthopedics;  Laterality: Left;   WISDOM TOOTH EXTRACTION     Allergies  Allergen Reactions   Cefaclor Rash    Because of a history of documented adverse serious drug reaction;Medi Alert bracelet  is recommended    Penicillins     REACTION: rash Because of a history of documented adverse serious drug reaction;Medi Alert bracelet  is recommended Has patient had a PCN reaction causing immediate rash, facial/tongue/throat swelling, SOB or lightheadedness with hypotension: no Has patient had a PCN reaction causing severe rash involving mucus membranes or skin necrosis: {no Has patient had a PCN reaction that required hospitalization no Has patient had a PCN reaction occurring within the last 10 years: no If all of the above ans   Sulfa Antibiotics Rash    01/22/13 after 9 days of Sulfa Because of a history of documented adverse serious drug reaction;Medi Alert bracelet  is recommended   Advair Diskus [Fluticasone-Salmeterol]     "Felt weird" and flushing.   Prior to Admission medications   Medication Sig Start Date End Date Taking? Authorizing Provider  cetirizine (ZYRTEC) 10 MG tablet Take 10 mg by mouth daily as needed for allergies.   Yes [provider]  cholecalciferol (VITAMIN D) 1000 units tablet Take 1,000 Units by mouth daily.   Yes [provider]  citalopram (CELEXA) 20 MG tablet Take 20 mg by mouth daily. 04/17/19  Yes [provider]  levothyroxine (SYNTHROID) 112 MCG tablet TAKE 1 TABLET DAILY EXCEPT FOR 1 AND 1/2 TABLETS ON [facility] 03/18/21  Yes Midge Minium, MD  metoprolol tartrate (LOPRESSOR) 25 MG tablet TAKE 0.5 TABLETS (12.5 MG TOTAL) BY MOUTH 2 (TWO) TIMES DAILY. Patient taking differently: Take 25 mg by mouth once. Take one tablet daily 11/09/20  Yes Midge Minium, MD  pantoprazole (PROTONIX) 20 MG tablet TAKE 1 TABLET BY MOUTH EVERY DAY Patient taking differently: Take 20 mg by mouth. Take one tablet every other day 06/21/20  Yes Midge Minium, MD  spironolactone (ALDACTONE) 25 MG tablet TAKE 1 TABLET BY MOUTH EVERY DAY 11/17/20  Yes Midge Minium, MD   Social History   Socioeconomic History   Marital status: Married    Spouse name:  Not on file   Number of children: 3   Years of education: Not on file   Highest education level: Not on file  Occupational History   Occupation: Retired  Tobacco Use   Smoking status: Never   Smokeless tobacco: Never  Vaping Use   Vaping Use: Never used  Substance and Sexual Activity   Alcohol use: Yes    Alcohol/week: 0.0 standard drinks    Comment:  very rarely   Drug use: No   Sexual activity: Yes    Birth control/protection: Post-menopausal  Other Topics Concern   Not on file  Social History Narrative   3 biological children (Set of Identical twins)    3 Step children    Social Determinants of Health   Financial Resource Strain: Low Risk    Difficulty of Paying Living Expenses: Not hard at all  Food Insecurity: No Food Insecurity   Worried About Running  Out of Food in the Last Year: Never true   Ran Out of Food in the Last Year: Never true  Transportation Needs: No Transportation Needs   Lack of Transportation (Medical): No   Lack of Transportation (Non-Medical): No  Physical Activity: Inactive   Days of Exercise per Week: 0 days   Minutes of Exercise per Session: 0 min  Stress: No Stress Concern Present   Feeling of Stress : Not at all  Social Connections: Socially Integrated   Frequency of Communication with Friends and Family: More than three times a week   Frequency of Social Gatherings with Friends and Family: More than three times a week   Attends Religious Services: More than 4 times per year   Active Member of [employer] or Organizations: Yes   Attends Music therapist: More than 4 times per year   Marital Status: Married  Human resources officer Violence: Not At Risk   Fear of Current or Ex-Partner: No   Emotionally Abused: No   Physically Abused: No   Sexually Abused: No    Review of Systems Per HPI.   Objective:   Vitals:   04/18/21 0909  BP: 128/66  Pulse: 68  Resp: 16  Temp: 98.2 F (36.8 C)  TempSrc: Temporal  SpO2: 99%  Weight:  208 lb (94.3 kg)  Height: 5\' 3"  (1.6 m)     Physical Exam Vitals reviewed.  Constitutional:      General: She is not in acute distress.    Appearance: Normal appearance. She is well-developed.  HENT:     Head: Normocephalic and atraumatic.  Cardiovascular:     Rate and Rhythm: Normal rate.  Pulmonary:     Effort: Pulmonary effort is normal.  Musculoskeletal:     Comments: Lumbar spine: No midline bony tenderness, slight spasm right lower paraspinals towards right sciatic notch.  Negative seated straight leg raise on left, pain in the low back on the right with right-sided straight leg raise, with slight delay to right lateral thigh pain.  Able to heel and toe walk without difficulty.  Reflexes 2+ at patella, Achilles and Babinski negative bilaterally.  No rash.  Neurological:     Mental Status: She is alert and oriented to person, place, and time.  Psychiatric:        Mood and Affect: Mood normal.     33 minutes spent during visit, including chart review, counseling and assimilation of information, exam, discussion of plan, and chart completion.    Assessment & Plan:  Kristine Olson is a 75 y.o. female . Herniation of intervertebral disc of lumbar region - Plan: cyclobenzaprine (FLEXERIL) 5 MG tablet, predniSONE (DELTASONE) 20 MG tablet  Acute right-sided low back pain with right-sided sciatica - Plan: cyclobenzaprine (FLEXERIL) 5 MG tablet, predniSONE (DELTASONE) 20 MG tablet  Elevated serum creatinine  Suspected flare of previous right-sided L2 impingement with herniated disc.  Did not have surgery for previous noted disc herniation in 2003.  Without specific trauma imaging deferred.   - Although improvement in creatinine recently, will avoid NSAIDs.   -Initial treatment of symptomatic care with heat or ice, gentle range of motion and stretching.  Handout given on symptomatic care.  - Flexeril 2.5 to 5 mg up to 3 times daily but potential side effects and risk discussed.   If not improving this week, start prednisone with potential side effects and risk discussed.  - RTC precautions for imaging or referral to specialist if not improving with this approach.  Meds ordered this encounter  Medications   cyclobenzaprine (FLEXERIL) 5 MG tablet    Sig: Take 1 tablet (5 mg total) by mouth 3 (three) times daily as needed for muscle spasms (start qhs prn due to sedation).    Dispense:  15 tablet    Refill:  1   predniSONE (DELTASONE) 20 MG tablet    Sig: 3 by mouth for 3 days, then 2 by mouth for 2 days, then 1 by mouth for 2 days, then 1/2 by mouth for 2 days.    Dispense:  16 tablet    Refill:  0   Patient Instructions  I suspect you may have a flair of the herniated disk.  Okay to try heat in the morning, ice as needed throughout the day for spasm or pain.  Tylenol is fine to use for pain throughout the day as well.  Avoid NSAIDs like Advil or Aleve.  Can try muscle relaxant initially at bedtime (start with 1/2 pill if possible), watch for sedation, dizziness, risk of falling.  Stop that medication if those occur.  If that medicine is tolerated, it can be taken up to 3 times per day.  If not improving in the next few days, can start prednisone as we discussed.  If it is still not improved with prednisone, then follow-up to decide on further imaging/treatment.  Good luck and let me know if there are questions.   Return to the clinic or go to the nearest emergency room if any of your symptoms worsen or new symptoms occur.  Acute Back Pain, Adult Acute back pain is sudden and usually short-lived. It is often caused by an injury to the muscles and tissues in the back. The injury may result from: A muscle or ligament getting overstretched or torn (strained). Ligaments are tissues that connect bones to each other. Lifting something improperly can cause a back strain. Wear and tear (degeneration) of the spinal disks. Spinal disks are circular tissue that provide cushioning  between the bones of the spine (vertebrae). Twisting motions, such as while playing sports or doing yard work. A hit to the back. Arthritis. You may have a physical exam, lab tests, and imaging tests to find the cause ofyour pain. Acute back pain usually goes away with rest and home care. Follow these instructions at home: Managing pain, stiffness, and swelling Treatment may include medicines for pain and inflammation that are taken by mouth or applied to the skin, prescription pain medicine, or muscle relaxants. Take over-the-counter and prescription medicines only as told by your health care provider. Your health care provider may recommend applying ice during the first 24-48 hours after your pain starts. To do this: Put ice in a plastic bag. Place a towel between your skin and the bag. Leave the ice on for 20 minutes, 2-3 times a day. If directed, apply heat to the affected area as often as told by your health care provider. Use the heat source that your health care provider recommends, such as a moist heat pack or a heating pad. Place a towel between your skin and the heat source. Leave the heat on for 20-30 minutes. Remove the heat if your skin turns bright red. This is especially important if you are unable to feel pain, heat, or cold. You have a greater risk of getting burned. Activity  Do not stay in bed. Staying in bed for more than 1-2 days can delay your recovery. Sit up and stand up straight. Avoid leaning  forward when you sit or hunching over when you stand. If you work at a desk, sit close to it so you do not need to lean over. Keep your chin tucked in. Keep your neck drawn back, and keep your elbows bent at a 90-degree angle (right angle). Sit high and close to the steering wheel when you drive. Add lower back (lumbar) support to your car seat, if needed. Take short walks on even surfaces as soon as you are able. Try to increase the length of time you walk each day. Do not sit,  drive, or stand in one place for more than 30 minutes at a time. Sitting or standing for long periods of time can put stress on your back. Do not drive or use heavy machinery while taking prescription pain medicine. Use proper lifting techniques. When you bend and lift, use positions that put less stress on your back: [place] your knees. Keep the load close to your body. Avoid twisting. Exercise regularly as told by your health care provider. Exercising helps your back heal faster and helps prevent back injuries by keeping muscles strong and flexible. Work with a physical therapist to make a safe exercise program, as recommended by your health care provider. Do any exercises as told by your physical therapist.  Lifestyle Maintain a healthy weight. Extra weight puts stress on your back and makes it difficult to have good posture. Avoid activities or situations that make you feel anxious or stressed. Stress and anxiety increase muscle tension and can make back pain worse. Learn ways to manage anxiety and stress, such as through exercise. General instructions Sleep on a firm mattress in a comfortable position. Try lying on your side with your knees slightly bent. If you lie on your back, put a pillow under your knees. Follow your treatment plan as told by your health care provider. This may include: Cognitive or behavioral therapy. Acupuncture or massage therapy. Meditation or yoga. Contact a health care provider if: You have pain that is not relieved with rest or medicine. You have increasing pain going down into your legs or buttocks. Your pain does not improve after 2 weeks. You have pain at night. You lose weight without trying. You have a fever or chills. Get help right away if: You develop new bowel or bladder control problems. You have unusual weakness or numbness in your arms or legs. You develop nausea or vomiting. You develop abdominal pain. You feel faint. Summary Acute back pain  is sudden and usually short-lived. Use proper lifting techniques. When you bend and lift, use positions that put less stress on your back. Take over-the-counter and prescription medicines and apply heat or ice as directed by your health care provider. This information is not intended to replace advice given to you by your health care provider. Make sure you discuss any questions you have with your healthcare provider. Document Revised: 06/29/2020 Document Reviewed: 07/02/2020 Elsevier Patient Education  2022 [place],   Merri Ray, MD [place] 04/18/21 9:54 AM

## 2021-04-20 ENCOUNTER — Encounter: Payer: Self-pay | Admitting: *Deleted

## 2021-04-22 ENCOUNTER — Telehealth: Payer: Self-pay | Admitting: Family Medicine

## 2021-04-22 DIAGNOSIS — M5441 Lumbago with sciatica, right side: Secondary | ICD-10-CM

## 2021-04-22 DIAGNOSIS — M5126 Other intervertebral disc displacement, lumbar region: Secondary | ICD-10-CM

## 2021-04-22 MED ORDER — PREDNISONE 20 MG PO TABS: ORAL_TABLET | ORAL | 0 refills | Status: DC

## 2021-04-22 NOTE — Telephone Encounter (Signed)
I do see that prescription was printed but not sure if it was taken at time of visit.  I have sent that electronically to her pharmacy and I called/advised patient.

## 2021-04-22 NOTE — Telephone Encounter (Signed)
Patient was seen in office on 04/18/2021 - She was told that Dr. Carlota Raspberry would give her a written prescription for Prednisone in case she needed it.  Patient states that she never received the rx and that she would like one called in because she is still experiencing pain from the inflamation.

## 2021-05-03 ENCOUNTER — Other Ambulatory Visit: Payer: Self-pay | Admitting: Family Medicine

## 2021-05-09 DIAGNOSIS — F411 Generalized anxiety disorder: Secondary | ICD-10-CM | POA: Diagnosis not present

## 2021-05-12 ENCOUNTER — Telehealth: Payer: Self-pay

## 2021-05-12 NOTE — Telephone Encounter (Signed)
Called patient with provider recommendations. Patient voiced understanding. 

## 2021-05-12 NOTE — Telephone Encounter (Signed)
Saw you on 04/18/2021 for herniated disc, tried muscle relaxer which helped but did not eliminate pain so pt started prednisone yesterday taking 2 of the 3 doses prescribed on medication due to elevated HR high of 106 pt reports feeling hot flashes, denies chest pain dizziness and any numbness in extremities.  Pt waited to take until now due to being on vacation for some time after her initial visit   Pt asking if she should be seen or discontinue medication? Back feels better despite side effects

## 2021-05-12 NOTE — Telephone Encounter (Signed)
Prednisone can cause elevated heart rate.  If she is symptomatic, can stop prednisone but if it is just the elevated heart rate without other symptoms okay to continue if she is getting some relief for her back issue.  Can stop temporarily and try restarting to see if the hot flashes will return.  Let me know if any new symptoms or worsening of the heart rate.

## 2021-05-16 ENCOUNTER — Telehealth: Payer: Self-pay

## 2021-05-16 DIAGNOSIS — M5441 Lumbago with sciatica, right side: Secondary | ICD-10-CM

## 2021-05-16 MED ORDER — PREDNISONE 10 MG PO TABS: 10.0000 mg | ORAL_TABLET | Freq: Every day | ORAL | 0 refills | Status: DC

## 2021-05-16 NOTE — Telephone Encounter (Signed)
Pt has questions about predniSONE (DELTASONE) 20 MG tablet she was having some reactions to it and also how to take it.  Pt call back [phone]

## 2021-05-16 NOTE — Telephone Encounter (Signed)
Called pt - she was taking 2 pills  throughout the day - several hours apart, caused hot flushes, fast heart rate - 100-106. No chest pain, not dizzy or other new side effects.  She then skipped next day, followed by 1 on another day, then 2 on other days. None today. Less symptoms when she took 2 pills on the following days - not same as initial side effects. Some increased agitation. Back was better on muscle relaxer, but pain returned off muscle relaxer. Does feel like was better on small dose of prednisone. 1 prednisone yesterday.   As back is improving, and tolerated '20mg'$  dose, will decrease prednisone to '20mg'$  per day for next 3 days, then fill Rx for '10mg'$  per day for following 3 days. Update on sx's in next 1 week.

## 2021-05-16 NOTE — Telephone Encounter (Signed)
Called patient to get additional information. Patient states she is not having a new reaction to the medicine. She states she has taken the prednisone all wrong. Patient was not following the instructions. She wants to know how to proceed, if she needs to start over and throw the remaining pills out. Patient also states she would like to lower the dose as she is very sensitive to medications and fears taking 3 pills at a time.

## 2021-05-24 ENCOUNTER — Other Ambulatory Visit: Payer: Self-pay | Admitting: Family Medicine

## 2021-06-06 ENCOUNTER — Ambulatory Visit: Payer: Medicare PPO

## 2021-06-06 ENCOUNTER — Telehealth: Payer: Self-pay | Admitting: *Deleted

## 2021-06-06 NOTE — Telephone Encounter (Signed)
Called patient on video,home, cell. Patient showed up at office , called patient on cell disconnected 3 times.   Unable to complete appointment.

## 2021-08-01 DIAGNOSIS — F411 Generalized anxiety disorder: Secondary | ICD-10-CM | POA: Diagnosis not present

## 2021-08-28 ENCOUNTER — Encounter: Payer: Self-pay | Admitting: Family Medicine

## 2021-08-30 ENCOUNTER — Telehealth (INDEPENDENT_AMBULATORY_CARE_PROVIDER_SITE_OTHER): Payer: Medicare PPO | Admitting: Registered Nurse

## 2021-08-30 ENCOUNTER — Encounter: Payer: Self-pay | Admitting: Registered Nurse

## 2021-08-30 ENCOUNTER — Other Ambulatory Visit: Payer: Self-pay

## 2021-08-30 VITALS — Temp 100.5°F

## 2021-08-30 DIAGNOSIS — U071 COVID-19: Secondary | ICD-10-CM

## 2021-08-30 MED ORDER — NIRMATRELVIR/RITONAVIR (PAXLOVID) TABLET (RENAL DOSING): 2.0000 | ORAL_TABLET | Freq: Two times a day (BID) | ORAL | 0 refills | Status: AC

## 2021-08-30 MED ORDER — DM-GUAIFENESIN ER 30-600 MG PO TB12: 1.0000 | ORAL_TABLET | Freq: Two times a day (BID) | ORAL | 0 refills | Status: DC | PRN

## 2021-08-30 MED ORDER — AZELASTINE HCL 0.1 % NA SOLN: 1.0000 | Freq: Two times a day (BID) | NASAL | 12 refills | Status: DC

## 2021-08-30 NOTE — Progress Notes (Signed)
Telemedicine Encounter- SOAP NOTE Established Patient  This Video encounter was conducted with the patient's (or proxy's) verbal consent via audio telecommunications: yes/no: Yes Patient was instructed to have this encounter in a suitably private space; and to only have persons present to whom they give permission to participate. In addition, patient identity was confirmed by use of name plus two identifiers (DOB and address).  I discussed the limitations, risks, security and privacy concerns of performing an evaluation and management service by telephone and the availability of in person appointments. I also discussed with the patient that there may be a patient responsible charge related to this service. The patient expressed understanding and agreed to proceed.  I spent a total of 14 minutes talking with the patient or their proxy.  Patient at home Provider in office  Participants: [name], NP and Kirke Corin  Chief Complaint  Patient presents with   Covid Positive    Patient states she tested positive covid on Sunday  . Patient states she starting feeling bad on Friday . She has been experiencing a cough, fever of 100.5, congestion, and feeling fatigue. She took multiple OTC medications and still not feeling better.    Subjective   Kristine Olson is a 75 y.o. established patient. Video visit today for covid+  HPI She thinks symptoms started Friday/Saturday Has severe seasonal allergies, but started with more coughing Fatigue set in over the weekend She has been working at [employer], which is where she thinks she was exposed Nasal and sinus congestion.  No shob, doe, nvd  Does have low grade temp. Some aching from coughs  Has been vaccinated x2, booster x 1  OTC meds not helping.   Patient Active Problem List   Diagnosis Date Noted   Morbid obesity due to excess calories ([place]) 10/31/2017   Anemia, iron deficiency 05/29/2016   OA (osteoarthritis) of  knee 05/22/2016   Physical exam 04/17/2016   Hemorrhoids 12/03/2015   Elevated serum creatinine 11/27/2014   OSA (obstructive sleep apnea) 03/28/2013   HTN (hypertension) 03/06/2013   Positional vertigo 10/18/2012   Fasting hyperglycemia 08/23/2012   Vitamin D deficiency 08/23/2012   Osteopenia 07/06/2011   POSTMENOPAUSAL SYNDROME 03/01/2010   GERD (gastroesophageal reflux disease) 03/05/2009   Hypothyroidism 11/20/2007   IBS 11/20/2007   TACHYCARDIA, HX OF 11/20/2007   History of colonic polyps 11/20/2007    Past Medical History:  Diagnosis Date   Acute pyloric channel ulcer with hemorrhage on Xarelto    Anemia    history of anemia after bleeding ulcer   Arthritis    ostearthritis - right  medial knee   Cataract    beginning cataracts   Complication of anesthesia    was given a medication while waiting to go to surgery that caused burning sensation on scalp was given another medication and the burning went away   Depression    GERD (gastroesophageal reflux disease)    "heartburn daily"-TUMS helpful   History of blood transfusion    2 units PRBC   Hx of sinus tachycardia    rarely any problems since metoprolol use   Hyperlipidemia    patient unaware, has never taking medication   Hypertension    Hypothyroidism    Osteoporosis    osteopenia   Sleep apnea    uses c pap   Varicose vein of leg     Current Outpatient Medications  Medication Sig Dispense Refill   azelastine (ASTELIN) 0.1 % nasal  spray Place 1 spray into both nostrils 2 (two) times daily. Use in each nostril as directed 30 mL 12   cetirizine (ZYRTEC) 10 MG tablet Take 10 mg by mouth daily as needed for allergies.     cholecalciferol (VITAMIN D) 1000 units tablet Take 1,000 Units by mouth daily.     citalopram (CELEXA) 20 MG tablet Take 20 mg by mouth daily.     cyclobenzaprine (FLEXERIL) 5 MG tablet Take 1 tablet (5 mg total) by mouth 3 (three) times daily as needed for muscle spasms (start qhs prn due  to sedation). 15 tablet 1   dextromethorphan-guaiFENesin (MUCINEX DM) 30-600 MG 12hr tablet Take 1 tablet by mouth 2 (two) times daily as needed for cough. 20 tablet 0   levothyroxine (SYNTHROID) 112 MCG tablet TAKE 1 TABLET DAILY EXCEPT FOR 1 AND 1/2 TABLETS ON WEDNESDAY 97 tablet 1   metoprolol tartrate (LOPRESSOR) 25 MG tablet TAKE 0.5 TABLETS BY MOUTH 2 TIMES DAILY. 90 tablet 1   nirmatrelvir/ritonavir EUA, renal dosing, (PAXLOVID) 10 x 150 MG & 10 x 100MG  TABS Take 2 tablets by mouth 2 (two) times daily for 5 days. (Take nirmatrelvir 150 mg one tablet twice daily for 5 days and ritonavir 100 mg one tablet twice daily for 5 days) Patient GFR is 44 20 tablet 0   pantoprazole (PROTONIX) 20 MG tablet TAKE 1 TABLET BY MOUTH EVERY DAY (Patient taking differently: Take 20 mg by mouth. Take one tablet every other day) 90 tablet 1   predniSONE (DELTASONE) 10 MG tablet Take 1 tablet (10 mg total) by mouth daily with breakfast. 3 tablet 0   spironolactone (ALDACTONE) 25 MG tablet TAKE 1 TABLET BY MOUTH EVERY DAY 90 tablet 1   No current facility-administered medications for this visit.    Allergies  Allergen Reactions   Cefaclor Rash    Because of a history of documented adverse serious drug reaction;Medi Alert bracelet  is recommended   Penicillins     REACTION: rash Because of a history of documented adverse serious drug reaction;Medi Alert bracelet  is recommended Has patient had a PCN reaction causing immediate rash, facial/tongue/throat swelling, SOB or lightheadedness with hypotension: no Has patient had a PCN reaction causing severe rash involving mucus membranes or skin necrosis: {no Has patient had a PCN reaction that required hospitalization no Has patient had a PCN reaction occurring within the last 10 years: no If all of the above ans   Sulfa Antibiotics Rash    01/22/13 after 9 days of Sulfa Because of a history of documented adverse serious drug reaction;Medi Alert bracelet  is  recommended   Advair Diskus [Fluticasone-Salmeterol]     "Felt weird" and flushing.    Social History   Socioeconomic History   Marital status: Married    Spouse name: Not on file   Number of children: 3   Years of education: Not on file   Highest education level: Not on file  Occupational History   Occupation: Retired  Tobacco Use   Smoking status: Never   Smokeless tobacco: Never  Vaping Use   Vaping Use: Never used  Substance and Sexual Activity   Alcohol use: Yes    Alcohol/week: 0.0 standard drinks    Comment:  very rarely   Drug use: No   Sexual activity: Yes    Birth control/protection: Post-menopausal  Other Topics Concern   Not on file  Social History Narrative   3 biological children (Set of Identical twins)  3 Step children    Social Determinants of Health   Financial Resource Strain: Not on file  Food Insecurity: Not on file  Transportation Needs: Not on file  Physical Activity: Not on file  Stress: Not on file  Social Connections: Not on file  Intimate Partner Violence: Not on file    ROS Per hpi   Objective   Vitals as reported by the patient: Today's Vitals   08/30/21 0909  Temp: (!) 100.5 F (38.1 C)  TempSrc: Temporal    Ceci was seen today for covid positive.  Diagnoses and all orders for this visit:  COVID-19 -     nirmatrelvir/ritonavir EUA, renal dosing, (PAXLOVID) 10 x 150 MG & 10 x 100MG  TABS; Take 2 tablets by mouth 2 (two) times daily for 5 days. (Take nirmatrelvir 150 mg one tablet twice daily for 5 days and ritonavir 100 mg one tablet twice daily for 5 days) Patient GFR is 44 -     dextromethorphan-guaiFENesin (MUCINEX DM) 30-600 MG 12hr tablet; Take 1 tablet by mouth 2 (two) times daily as needed for cough. -     azelastine (ASTELIN) 0.1 % nasal spray; Place 1 spray into both nostrils 2 (two) times daily. Use in each nostril as directed   PLAN Discussed risks, benefits, and alternatives for treatment options with  patient who voices understanding. Will pursue renal dosing paxlovid as above Mucinex dm and azelastine nasal spray for symptom relief Discussed supportive care and isolation guidelines with patient, who voices understanding Patient encouraged to call clinic with any questions, comments, or concerns.  I discussed the assessment and treatment plan with the patient. The patient was provided an opportunity to ask questions and all were answered. The patient agreed with the plan and demonstrated an understanding of the instructions.   The patient was advised to call back or seek an in-person evaluation if the symptoms worsen or if the condition fails to improve as anticipated.  I provided 14 minutes of face-to-face time during this encounter.  Maximiano Coss, NP

## 2021-08-30 NOTE — Patient Instructions (Signed)
° ° ° °  If you have lab work done today you will be contacted with your lab results within the next 2 weeks.  If you have not heard from us then please contact us. The fastest way to get your results is to register for My Chart. ° ° °IF you received an x-ray today, you will receive an invoice from [facility]. Please contact [facility] at [phone] with questions or concerns regarding your invoice.  ° °IF you received labwork today, you will receive an invoice from [facility]. Please contact [facility] at [phone] with questions or concerns regarding your invoice.  ° °Our billing staff will not be able to assist you with questions regarding bills from these companies. ° °You will be contacted with the lab results as soon as they are available. The fastest way to get your results is to activate your My Chart account. Instructions are located on the last page of this paperwork. If you have not heard from us regarding the results in 2 weeks, please contact this office. °  ° ° ° °

## 2021-09-01 ENCOUNTER — Telehealth: Payer: Self-pay | Admitting: Family Medicine

## 2021-09-01 NOTE — Telephone Encounter (Signed)
If she is having severe diarrhea and already feeling better, she can stop meds

## 2021-09-01 NOTE — Telephone Encounter (Signed)
Called and spoke with patient about concerns. Patient started having symptoms last Saturday and tested positive Sunday. Patient was seen and did not get any medication until Tuesday and by then she had started to feel better but went ahead and took the paxlovid due to feeling it will help against having long covid but ever since taking the paxlovid she has been experiencing some sever diarrhea and wanted to know if she she should continue or not with the mediation regimen.

## 2021-09-01 NOTE — Telephone Encounter (Signed)
..  Caller name:  Kristine Olson  On DPR? :yes/no: Yes  Call back number:[phone]  Provider they see: Birdie Riddle  Reason for call: Patient wants to know if she should continue Paxlovid - needs to know by lunch today so she will know whether to take it or not.  Please advise

## 2021-09-01 NOTE — Telephone Encounter (Signed)
Please advise 

## 2021-09-01 NOTE — Telephone Encounter (Signed)
Called patient back and advised her per you that if she is having severe diarrhea and already feeling better she can stop the paxlovid. Patient understood. No further concerns at this time.

## 2021-09-01 NOTE — Telephone Encounter (Signed)
I don't understand why she wouldn't take the medication if it was prescribed.  Is she having problems w/ it?  Does she have a specific concern?  Unless she is having an issue, I would take the medication as prescribed.

## 2021-09-05 ENCOUNTER — Encounter: Payer: Self-pay | Admitting: Family Medicine

## 2021-09-05 ENCOUNTER — Ambulatory Visit: Payer: Medicare PPO | Admitting: Family Medicine

## 2021-09-05 DIAGNOSIS — Z23 Encounter for immunization: Secondary | ICD-10-CM

## 2021-09-05 NOTE — Progress Notes (Signed)
   Subjective:    Patient ID: Kristine Olson, female    DOB: April 27, 1946, 75 y.o.   MRN: [id number]  HPI Obesity- ongoing issue for pt.  BMI is 37.98 and given her HTN and fasting hyperglycemia she qualifies as morbidly obese.  Pt reports she has been struggling to lose weight for ~20 yrs.  Pt has done [facility] in the past and lost 23 lbs.  Gained this back when she stopped participating in program.  Has done [facility] 'at least 3 times'.  Did [employer].  Pt reports she has very hard time w/ 'self control'.  + emotional component to eating.  Is considering Optivia but isn't sure this is sustainable due to lack of calories and cost   Review of Systems For ROS see HPI   This visit occurred during the SARS-CoV-2 public health emergency.  Safety protocols were in place, including screening questions prior to the visit, additional usage of staff PPE, and extensive cleaning of exam room while observing appropriate contact time as indicated for disinfecting solutions.      Objective:   Physical Exam Vitals reviewed.  Constitutional:      General: She is not in acute distress.    Appearance: Normal appearance. She is obese. She is not ill-appearing.  HENT:     Head: Normocephalic and atraumatic.  Eyes:     Extraocular Movements: Extraocular movements intact.     Conjunctiva/sclera: Conjunctivae normal.     Pupils: Pupils are equal, round, and reactive to light.  Cardiovascular:     Rate and Rhythm: Normal rate and regular rhythm.     Pulses: Normal pulses.     Heart sounds: Normal heart sounds.  Pulmonary:     Effort: Pulmonary effort is normal. No respiratory distress.     Breath sounds: Normal breath sounds. No wheezing or rales.  Musculoskeletal:     Right lower leg: No edema.     Left lower leg: No edema.  Skin:    General: Skin is warm and dry.  Neurological:     General: No focal deficit present.     Mental Status: She is alert and oriented to person, place, and time.  Psychiatric:         Mood and Affect: Mood normal.        Behavior: Behavior normal.        Thought Content: Thought content normal.          Assessment & Plan:

## 2021-09-05 NOTE — Assessment & Plan Note (Signed)
Ongoing issue for pt.  We spent the entire visit- 28 minutes- talking about weight loss.  We discussed previous efforts and what made them successful and why she stopped.  We talked about will power, regular exercise, establishing good habits.  She doesn't think she wants to take medication but wants a program that will show results and hold her accountable.  Will refer to [facility] and in the meantime, since she has already paid for a year's subscription to [facility], I encouraged her to restart.  Pt expressed understanding and is in agreement w/ plan.  Will follow.

## 2021-09-05 NOTE — Patient Instructions (Addendum)
Schedule your complete physical after 4/14 We'll call you with your Medical Weight Management appts Restart [facility] while we're waiting for the appt Call with any questions or concerns Stay Safe!  Stay Healthy! Happy Holidays!!

## 2021-09-13 ENCOUNTER — Other Ambulatory Visit: Payer: Self-pay

## 2021-09-13 ENCOUNTER — Ambulatory Visit: Payer: Medicare PPO | Admitting: Registered Nurse

## 2021-09-13 ENCOUNTER — Encounter: Payer: Self-pay | Admitting: Registered Nurse

## 2021-09-13 VITALS — BP 125/71 | HR 69 | Temp 98.3°F | Resp 18 | Ht 63.0 in | Wt 212.8 lb

## 2021-09-13 DIAGNOSIS — M5126 Other intervertebral disc displacement, lumbar region: Secondary | ICD-10-CM

## 2021-09-13 DIAGNOSIS — U071 COVID-19: Secondary | ICD-10-CM

## 2021-09-13 DIAGNOSIS — R7989 Other specified abnormal findings of blood chemistry: Secondary | ICD-10-CM

## 2021-09-13 DIAGNOSIS — M5441 Lumbago with sciatica, right side: Secondary | ICD-10-CM | POA: Diagnosis not present

## 2021-09-13 MED ORDER — CYCLOBENZAPRINE HCL 5 MG PO TABS: 5.0000 mg | ORAL_TABLET | Freq: Three times a day (TID) | ORAL | 1 refills | Status: DC | PRN

## 2021-09-13 MED ORDER — PREDNISONE 10 MG PO TABS: ORAL_TABLET | ORAL | 0 refills | Status: AC

## 2021-09-13 NOTE — Progress Notes (Signed)
Established Patient Office Visit  Subjective:  Patient ID: Kristine Olson, female    DOB: [birth year]  Age: 75 y.o. MRN: [id number]  CC:  Chief Complaint  Patient presents with   Sciatica    Patient states she is having a sciatica flare up. She is having back pain and right right leg pain since last weekend.    HPI Kristine Olson presents for back pain, congestion  Back pain Hx of hernated disc in lumbar spine Notes she was sitting watching a movie, poor posture, then pain onset Usually can sleep flat on firm mattress for resolution, did not help this time Pain onset on Thursday, marginally improved today. Taking conservative nonpharm measures.  R lumbar spine pain with R sciatica Has used prednisone and flexeril in the past with good effect Does note prednisone gave her some rapid heart rate and anxiety - would prefer lower dose. Hx of urinary urgency, question of this being worse since onset of back pain. No other urinary or constitutional symptoms.  Congestion Ongoing since COVID 14 days ago Continues to use nasal spray and OTC antihistamine with good effect. Negative at home covid test this morning No new or worsening symptoms.    Past Medical History:  Diagnosis Date   Acute pyloric channel ulcer with hemorrhage on Xarelto    Anemia    history of anemia after bleeding ulcer   Arthritis    ostearthritis - right  medial knee   Cataract    beginning cataracts   Complication of anesthesia    was given a medication while waiting to go to surgery that caused burning sensation on scalp was given another medication and the burning went away   Depression    GERD (gastroesophageal reflux disease)    "heartburn daily"-TUMS helpful   History of blood transfusion    2 units PRBC   Hx of sinus tachycardia    rarely any problems since metoprolol use   Hyperlipidemia    patient unaware, has never taking medication   Hypertension    Hypothyroidism    Osteoporosis     osteopenia   Sleep apnea    uses c pap   Varicose vein of leg     Past Surgical History:  Procedure Laterality Date   BIOPSY BREAST     2 biopsies & 2 aspirations   CESAREAN SECTION     CLOSED REDUCTION TOE FRACTURE     COLONOSCOPY  2015   COLONOSCOPY W/ POLYPECTOMY  multiple since 2007   adenomas, diverticulosis;last 2015   ESOPHAGOGASTRODUODENOSCOPY (EGD) WITH PROPOFOL N/A 05/29/2016   Procedure: ESOPHAGOGASTRODUODENOSCOPY (EGD) WITH PROPOFOL;  Surgeon: Gatha Mayer, MD;  Location: [facility];  Service: Endoscopy;  Laterality: N/A;   HAMMER TOE SURGERY     PARTIAL KNEE ARTHROPLASTY Right 05/22/2016   Procedure: RIGHT KNEE MEDIAL UNICOMPARTMENTAL ARTHROPLASTY;  Surgeon: Gaynelle Arabian, MD;  Location: [facility];  Service: Orthopedics;  Laterality: Right;   TONSILLECTOMY AND ADENOIDECTOMY     TOTAL KNEE ARTHROPLASTY Left 08/27/2017   Procedure: LEFT TOTAL KNEE ARTHROPLASTY;  Surgeon: Gaynelle Arabian, MD;  Location: [facility];  Service: Orthopedics;  Laterality: Left;   WISDOM TOOTH EXTRACTION      Family History  Problem Relation Age of Onset   Osteoporosis Mother    Thyroid disease Mother    Rheum arthritis Mother    Stroke Maternal Aunt         X 3    Diabetes Maternal Aunt    Heart  attack Maternal Grandfather        > 12   Melanoma Brother    Multiple sclerosis Daughter 68   Deep vein thrombosis Paternal Aunt    Other Paternal Aunt        Phlebitis without deep venous thrombosis   Hypertension Father    Alcohol abuse Father    Liver disease Father    Cancer Maternal Grandmother    Depression Daughter    Bipolar disorder Daughter    Colon cancer Neg Hx    Colon polyps Neg Hx    Esophageal cancer Neg Hx    Rectal cancer Neg Hx    Stomach cancer Neg Hx     Social History   Socioeconomic History   Marital status: Married    Spouse name: Not on file   Number of children: 3   Years of education: Not on file   Highest education level: Not on file  Occupational  History   Occupation: Retired  Tobacco Use   Smoking status: Never   Smokeless tobacco: Never  Vaping Use   Vaping Use: Never used  Substance and Sexual Activity   Alcohol use: Yes    Alcohol/week: 0.0 standard drinks    Comment:  very rarely   Drug use: No   Sexual activity: Yes    Birth control/protection: Post-menopausal  Other Topics Concern   Not on file  Social History Narrative   3 biological children (Set of Identical twins)    3 Step children    Social Determinants of Health   Financial Resource Strain: Not on file  Food Insecurity: Not on file  Transportation Needs: Not on file  Physical Activity: Not on file  Stress: Not on file  Social Connections: Not on file  Intimate Partner Violence: Not on file    Outpatient Medications Prior to Visit  Medication Sig Dispense Refill   cetirizine (ZYRTEC) 10 MG tablet Take 10 mg by mouth daily as needed for allergies.     cholecalciferol (VITAMIN D) 1000 units tablet Take 1,000 Units by mouth daily.     citalopram (CELEXA) 20 MG tablet Take 20 mg by mouth daily.     levothyroxine (SYNTHROID) 112 MCG tablet TAKE 1 TABLET DAILY EXCEPT FOR 1 AND 1/2 TABLETS ON WEDNESDAY 97 tablet 1   metoprolol tartrate (LOPRESSOR) 25 MG tablet TAKE 0.5 TABLETS BY MOUTH 2 TIMES DAILY. 90 tablet 1   pantoprazole (PROTONIX) 20 MG tablet TAKE 1 TABLET BY MOUTH EVERY DAY (Patient taking differently: Take 20 mg by mouth. Take one tablet every other day) 90 tablet 1   spironolactone (ALDACTONE) 25 MG tablet TAKE 1 TABLET BY MOUTH EVERY DAY 90 tablet 1   No facility-administered medications prior to visit.    Allergies  Allergen Reactions   Cefaclor Rash    Because of a history of documented adverse serious drug reaction;Medi Alert bracelet  is recommended   Penicillins     REACTION: rash Because of a history of documented adverse serious drug reaction;Medi Alert bracelet  is recommended Has patient had a PCN reaction causing immediate rash,  facial/tongue/throat swelling, SOB or lightheadedness with hypotension: no Has patient had a PCN reaction causing severe rash involving mucus membranes or skin necrosis: {no Has patient had a PCN reaction that required hospitalization no Has patient had a PCN reaction occurring within the last 10 years: no If all of the above ans   Sulfa Antibiotics Rash    01/22/13 after 9 days of Sulfa  Because of a history of documented adverse serious drug reaction;Medi Alert bracelet  is recommended   Advair Diskus [Fluticasone-Salmeterol]     "Felt weird" and flushing.    ROS Review of Systems  Constitutional: Negative.   HENT:  Positive for congestion.   Eyes: Negative.   Respiratory: Negative.    Cardiovascular: Negative.   Gastrointestinal: Negative.   Genitourinary: Negative.   Musculoskeletal:  Positive for back pain.  Skin: Negative.   Neurological: Negative.   Psychiatric/Behavioral: Negative.    All other systems reviewed and are negative.    Objective:    Physical Exam Vitals and nursing note reviewed.  Constitutional:      General: She is not in acute distress.    Appearance: Normal appearance. She is normal weight. She is not ill-appearing, toxic-appearing or diaphoretic.  Cardiovascular:     Rate and Rhythm: Normal rate and regular rhythm.     Heart sounds: Normal heart sounds. No murmur heard.   No friction rub. No gallop.  Pulmonary:     Effort: Pulmonary effort is normal. No respiratory distress.     Breath sounds: Normal breath sounds. No stridor. No wheezing, rhonchi or rales.  Chest:     Chest wall: No tenderness.  Abdominal:     Tenderness: There is no right CVA tenderness or left CVA tenderness.  Musculoskeletal:        General: Tenderness (R lumbar spine and R posterior hip) present. No swelling, deformity or signs of injury. Normal range of motion.     Right lower leg: No edema.     Left lower leg: No edema.     Comments: Positive R straight leg raise   Skin:    General: Skin is warm and dry.  Neurological:     General: No focal deficit present.     Mental Status: She is alert and oriented to person, place, and time. Mental status is at baseline.  Psychiatric:        Mood and Affect: Mood normal.        Behavior: Behavior normal.        Thought Content: Thought content normal.        Judgment: Judgment normal.    BP 125/71   Pulse 69   Temp 98.3 F (36.8 C) (Temporal)   Resp 18   Ht 5\' 3"  (1.6 m)   Wt 212 lb 12.8 oz (96.5 kg)   SpO2 99%   BMI 37.70 kg/m  Wt Readings from Last 3 Encounters:  09/13/21 212 lb 12.8 oz (96.5 kg)  09/05/21 214 lb 6.4 oz (97.3 kg)  04/18/21 208 lb (94.3 kg)     Health Maintenance Due  Topic Date Due   Zoster Vaccines- Shingrix (1 of 2) Never done   DEXA SCAN  08/30/2018   COVID-19 Vaccine (4 - Booster for Pfizer series) 11/16/2020    There are no preventive care reminders to display for this patient.  Lab Results  Component Value Date   TSH 0.57 02/03/2021   Lab Results  Component Value Date   WBC 8.7 02/03/2021   HGB 11.3 (L) 02/03/2021   HCT 36.0 02/03/2021   MCV 81.3 02/03/2021   PLT 275 02/03/2021   Lab Results  Component Value Date   NA 136 04/14/2021   K 4.2 04/14/2021   CO2 28 04/14/2021   GLUCOSE 86 04/14/2021   BUN 15 04/14/2021   CREATININE 1.25 (H) 04/14/2021   BILITOT 0.4 02/03/2021   ALKPHOS 55 04/24/2018  AST 18 02/03/2021   ALT 13 02/03/2021   PROT 7.1 02/03/2021   ALBUMIN 4.1 04/24/2018   CALCIUM 9.5 04/14/2021   ANIONGAP 9 08/29/2017   GFR 42.96 (L) 04/14/2021   Lab Results  Component Value Date   CHOL 181 02/03/2021   Lab Results  Component Value Date   HDL 73 02/03/2021   Lab Results  Component Value Date   LDLCALC 86 02/03/2021   Lab Results  Component Value Date   TRIG 122 02/03/2021   Lab Results  Component Value Date   CHOLHDL 2.5 02/03/2021   Lab Results  Component Value Date   HGBA1C 5.9 11/25/2014      Assessment &  Plan:   Problem List Items Addressed This Visit       Other   Elevated serum creatinine   Other Visit Diagnoses     Acute right-sided low back pain with right-sided sciatica    -  Primary   Relevant Medications   cyclobenzaprine (FLEXERIL) 5 MG tablet   predniSONE (DELTASONE) 10 MG tablet   Herniation of intervertebral disc of lumbar region       Relevant Medications   cyclobenzaprine (FLEXERIL) 5 MG tablet   predniSONE (DELTASONE) 10 MG tablet   COVID-19           Meds ordered this encounter  Medications   cyclobenzaprine (FLEXERIL) 5 MG tablet    Sig: Take 1 tablet (5 mg total) by mouth 3 (three) times daily as needed for muscle spasms.    Dispense:  30 tablet    Refill:  1    Order Specific Question:   Supervising Provider    Answer:   Carlota Raspberry, JEFFREY R [[id number]]   predniSONE (DELTASONE) 10 MG tablet    Sig: Take 1 tablet (10 mg total) by mouth daily with breakfast for 3 days, THEN 0.5 tablets (5 mg total) daily with breakfast for 4 days.    Dispense:  5 tablet    Refill:  0    Order Specific Question:   Supervising Provider    Answer:   Carlota Raspberry, JEFFREY R [[id number]]    Follow-up: Return if symptoms worsen or fail to improve.   PLAN Reassured patient that she's no longer contagious with covid. Continue conservative measures. Return if worsening. Lower back pain - no acute injury, will pursue same meds as last time with lower dose of prednisone and flexeril as above. Will collect urine to rule out UTI - addendum: pt unable to leave sample, testing canceled. Will return if worsening urinary symptoms arise.  Return precautions reviewed Patient encouraged to call clinic with any questions, comments, or concerns.   Maximiano Coss, NP

## 2021-09-13 NOTE — Patient Instructions (Addendum)
Ms. Kristine Olson to meet you in person!  Flexeril and prednisone as above.  I'll call with urinary testing results.  Keep going with zyrtec and flonase  Thank you!  Rich     If you have lab work done today you will be contacted with your lab results within the next 2 weeks.  If you have not heard from [place] then please contact us. The fastest way to get your results is to register for My Chart.   IF you received an x-ray today, you will receive an invoice from [facility]. Please contact [facility] at [phone] with questions or concerns regarding your invoice.   IF you received labwork today, you will receive an invoice from [facility]. Please contact [facility] at [phone] with questions or concerns regarding your invoice.   Our billing staff will not be able to assist you with questions regarding bills from these companies.  You will be contacted with the lab results as soon as they are available. The fastest way to get your results is to activate your My Chart account. Instructions are located on the last page of this paperwork. If you have not heard from [place] regarding the results in 2 weeks, please contact this office.

## 2021-09-17 ENCOUNTER — Other Ambulatory Visit: Payer: Self-pay | Admitting: Family Medicine

## 2021-09-22 ENCOUNTER — Encounter: Payer: Self-pay | Admitting: Family Medicine

## 2021-09-22 MED ORDER — PREDNISONE 10 MG PO TABS: ORAL_TABLET | ORAL | 0 refills | Status: DC

## 2021-10-01 ENCOUNTER — Other Ambulatory Visit: Payer: Self-pay | Admitting: Family Medicine

## 2021-10-06 ENCOUNTER — Ambulatory Visit (INDEPENDENT_AMBULATORY_CARE_PROVIDER_SITE_OTHER): Payer: Medicare PPO

## 2021-10-06 DIAGNOSIS — Z0000 Encounter for general adult medical examination without abnormal findings: Secondary | ICD-10-CM | POA: Diagnosis not present

## 2021-10-06 DIAGNOSIS — Z1231 Encounter for screening mammogram for malignant neoplasm of breast: Secondary | ICD-10-CM

## 2021-10-06 NOTE — Progress Notes (Signed)
Subjective:   Kristine Olson is a 75 y.o. female who presents for Medicare Annual (Subsequent) preventive examination.  I connected with Anihya Tuma today by telephone and verified that I am speaking with the correct person using two identifiers. Location patient: home Location provider: work Persons participating in the virtual visit: patient, provider.   I discussed the limitations, risks, security and privacy concerns of performing an evaluation and management service by telephone and the availability of in person appointments. I also discussed with the patient that there may be a patient responsible charge related to this service. The patient expressed understanding and verbally consented to this telephonic visit.    Interactive audio and video telecommunications were attempted between this provider and patient, however failed, due to patient having technical difficulties OR patient did not have access to video capability.  We continued and completed visit with audio only.    Review of Systems     Cardiac Risk Factors include: advanced age (>49men, >46 women);hypertension     Objective:    Today's Vitals   10/06/21 1436  PainSc: 2    There is no height or weight on file to calculate BMI.  Advanced Directives 10/06/2021 02/15/2021 06/01/2020 04/24/2018 08/27/2017 08/27/2017 08/21/2017  Does Patient Have a Medical Advance Directive? Yes Yes Yes Yes Yes Yes Yes  Type of Paramedic of [place];Living will [place];Living will;Out of facility DNR (pink MOST or yellow form) [place];Living will [place];Living will [place];Living will [place];Living will [place];Living will  Does patient want to make changes to medical advance directive? - - - - No - Patient declined - -  Copy of [place] in Chart? No - copy requested -  No - copy requested - No - copy requested No - copy requested No - copy requested    Current Medications (verified) Outpatient Encounter Medications as of 10/06/2021  Medication Sig   cetirizine (ZYRTEC) 10 MG tablet Take 10 mg by mouth daily as needed for allergies.   cholecalciferol (VITAMIN D) 1000 units tablet Take 1,000 Units by mouth daily.   citalopram (CELEXA) 20 MG tablet Take 20 mg by mouth daily.   cyclobenzaprine (FLEXERIL) 5 MG tablet Take 1 tablet (5 mg total) by mouth 3 (three) times daily as needed for muscle spasms.   levothyroxine (SYNTHROID) 112 MCG tablet TAKE 1 TABLET DAILY EXCEPT FOR 1 AND 1/2 TABLETS ON WEDNESDAY   metoprolol tartrate (LOPRESSOR) 25 MG tablet TAKE 0.5 TABLETS BY MOUTH 2 TIMES DAILY.   pantoprazole (PROTONIX) 20 MG tablet TAKE 1 TABLET BY MOUTH EVERY DAY   spironolactone (ALDACTONE) 25 MG tablet TAKE 1 TABLET BY MOUTH EVERY DAY   predniSONE (DELTASONE) 10 MG tablet 3 tabs x3 days and then 2 tabs x3 days and then 1 tab x3 days.  Take w/ food.   No facility-administered encounter medications on file as of 10/06/2021.    Allergies (verified) Cefaclor, Penicillins, Sulfa antibiotics, and Advair diskus [fluticasone-salmeterol]   History: Past Medical History:  Diagnosis Date   Acute pyloric channel ulcer with hemorrhage on Xarelto    Anemia    history of anemia after bleeding ulcer   Arthritis    ostearthritis - right  medial knee   Cataract    beginning cataracts   Complication of anesthesia    was given a medication while waiting to go to surgery that caused burning sensation  on scalp was given another medication and the burning went away   Depression    GERD (gastroesophageal reflux disease)    "heartburn daily"-TUMS helpful   History of blood transfusion    2 units PRBC   Hx of sinus tachycardia    rarely any problems since metoprolol use   Hyperlipidemia    patient unaware, has never taking medication   Hypertension    Hypothyroidism     Osteoporosis    osteopenia   Sleep apnea    uses c pap   Varicose vein of leg    Past Surgical History:  Procedure Laterality Date   BIOPSY BREAST     2 biopsies & 2 aspirations   CESAREAN SECTION     CLOSED REDUCTION TOE FRACTURE     COLONOSCOPY  2015   COLONOSCOPY W/ POLYPECTOMY  multiple since 2007   adenomas, diverticulosis;last 2015   ESOPHAGOGASTRODUODENOSCOPY (EGD) WITH PROPOFOL N/A 05/29/2016   Procedure: ESOPHAGOGASTRODUODENOSCOPY (EGD) WITH PROPOFOL;  Surgeon: Gatha Mayer, MD;  Location: [facility];  Service: Endoscopy;  Laterality: N/A;   HAMMER TOE SURGERY     PARTIAL KNEE ARTHROPLASTY Right 05/22/2016   Procedure: RIGHT KNEE MEDIAL UNICOMPARTMENTAL ARTHROPLASTY;  Surgeon: Gaynelle Arabian, MD;  Location: [facility];  Service: Orthopedics;  Laterality: Right;   TONSILLECTOMY AND ADENOIDECTOMY     TOTAL KNEE ARTHROPLASTY Left 08/27/2017   Procedure: LEFT TOTAL KNEE ARTHROPLASTY;  Surgeon: Gaynelle Arabian, MD;  Location: [facility];  Service: Orthopedics;  Laterality: Left;   WISDOM TOOTH EXTRACTION     Family History  Problem Relation Age of Onset   Osteoporosis Mother    Thyroid disease Mother    Rheum arthritis Mother    Stroke Maternal Aunt         X 3    Diabetes Maternal Aunt    Heart attack Maternal Grandfather        > 4   Melanoma Brother    Multiple sclerosis Daughter 34   Deep vein thrombosis Paternal Aunt    Other Paternal Aunt        Phlebitis without deep venous thrombosis   Hypertension Father    Alcohol abuse Father    Liver disease Father    Cancer Maternal Grandmother    Depression Daughter    Bipolar disorder Daughter    Colon cancer Neg Hx    Colon polyps Neg Hx    Esophageal cancer Neg Hx    Rectal cancer Neg Hx    Stomach cancer Neg Hx    Social History   Socioeconomic History   Marital status: Married    Spouse name: Not on file   Number of children: 3   Years of education: Not on file   Highest education level: Not on file   Occupational History   Occupation: Retired  Tobacco Use   Smoking status: Never   Smokeless tobacco: Never  Vaping Use   Vaping Use: Never used  Substance and Sexual Activity   Alcohol use: Yes    Alcohol/week: 0.0 standard drinks    Comment:  very rarely   Drug use: No   Sexual activity: Yes    Birth control/protection: Post-menopausal  Other Topics Concern   Not on file  Social History Narrative   3 biological children (Set of Identical twins)    3 Step children    Social Determinants of Health   Financial Resource Strain: Low Risk    Difficulty of Paying Living Expenses: Not hard at  all  Food Insecurity: No Food Insecurity   Worried About Charity fundraiser in the Last Year: Never true   Ran Out of Food in the Last Year: Never true  Transportation Needs: No Transportation Needs   Lack of Transportation (Medical): No   Lack of Transportation (Non-Medical): No  Physical Activity: Inactive   Days of Exercise per Week: 0 days   Minutes of Exercise per Session: 0 min  Stress: No Stress Concern Present   Feeling of Stress : Not at all  Social Connections: Socially Integrated   Frequency of Communication with Friends and Family: Twice a week   Frequency of Social Gatherings with Friends and Family: Twice a week   Attends Religious Services: More than 4 times per year   Active Member of [employer] or Organizations: Yes   Attends Music therapist: More than 4 times per year   Marital Status: Married    Tobacco Counseling Counseling given: Not Answered   Clinical Intake:  Pre-visit preparation completed: Yes  Pain : 0-10 Pain Score: 2  Pain Type: Acute pain Pain Location: Back Pain Onset: In the past 7 days Pain Relieving Factors: meds  Pain Relieving Factors: meds  Nutritional Risks: None Diabetes: No  How often do you need to have someone help you when you read instructions, pamphlets, or other written materials from your doctor or pharmacy?: 1 -  Never What is the last grade level you completed in school?: masters  Diabetic?no   Interpreter Needed?: No  Information entered by :: l.Thatcher Doberstein,LPn   Activities of Daily Living In your present state of health, do you have any difficulty performing the following activities: 10/06/2021 02/03/2021  Hearing? N N  Vision? N Y  Difficulty concentrating or making decisions? N N  Walking or climbing stairs? N N  Dressing or bathing? N N  Doing errands, shopping? N N  Preparing Food and eating ? N -  Using the Toilet? N -  In the past six months, have you accidently leaked urine? N -  Do you have problems with loss of bowel control? N -  Managing your Medications? N -  Managing your Finances? N -  Housekeeping or managing your Housekeeping? N -  Some recent data might be hidden    Patient Care Team: Midge Minium, MD as PCP - General (Family Medicine) Charlynn Grimes, MD as Referring Physician (Obstetrics and Gynecology) Gatha Mayer, MD as Consulting Physician (Gastroenterology) Marvia Pickles (Psychiatry) Gaynelle Arabian, MD as Consulting Physician (Orthopedic Surgery) Deneise Lever, MD as Consulting Physician (Pulmonary Disease) Minus Breeding, MD as Consulting Physician (Cardiology) Tat, Eustace Quail, DO as Consulting Physician (Neurology)  Indicate any recent Medical Services you may have received from other than [facility] providers in the past year (date may be approximate).     Assessment:   This is a routine wellness examination for [place].  Hearing/Vision screen Vision Screening - Comments:: Annual eye exams wear glasses   Dietary issues and exercise activities discussed: Current Exercise Habits: The patient does not participate in regular exercise at present, Exercise limited by: None identified   Goals Addressed   None    Depression Screen PHQ 2/9 Scores 10/06/2021 10/06/2021 09/13/2021 09/05/2021 08/30/2021 04/18/2021 02/03/2021  PHQ - 2 Score 0 0 0 0 0 0  0  PHQ- 9 Score - - 0 4 0 0 0  Exception Documentation - - - - - - -    Fall Risk Fall Risk  10/06/2021 09/13/2021  09/05/2021 08/30/2021 04/18/2021  Falls in the past year? 0 0 0 0 0  Number falls in past yr: 0 0 - 0 -  Injury with Fall? 0 0 - 0 -  Risk for fall due to : - No Fall Risks No Fall Risks No Fall Risks -  Follow up Falls evaluation completed Falls evaluation completed Falls evaluation completed Falls evaluation completed Falls evaluation completed    [place]:  Any stairs in or around the home? Yes  If so, are there any without handrails? No  Home free of loose throw rugs in walkways, pet beds, electrical cords, etc? Yes  Adequate lighting in your home to reduce risk of falls? Yes   ASSISTIVE DEVICES UTILIZED TO PREVENT FALLS:  Life alert? No  Use of a cane, walker or w/c? No  Grab bars in the bathroom? No  Shower chair or bench in shower? Yes  Elevated toilet seat or a handicapped toilet? No   Cognitive Function: Normal cognitive status assessed by direct observation by this Nurse Health Advisor. No abnormalities found.   MMSE - Mini Mental State Exam 04/24/2018  Orientation to time 5  Orientation to Place 5  Registration 3  Attention/ Calculation 5  Recall 3  Language- name 2 objects 2  Language- repeat 1  Language- follow 3 step command 3  Language- read & follow direction 1  Write a sentence 1  Copy design 1  Total score 30        Immunizations Immunization History  Administered Date(s) Administered   Fluad Quad(high Dose 65+) 07/07/2019, 10/29/2020, 09/05/2021   Hepatitis A, Adult 10/04/2012   Hepatitis B, adult 10/04/2012   IPV 10/04/2012   Influenza, High Dose Seasonal PF 09/06/2018   Influenza,inj,Quad PF,6+ Mos 10/09/2016, 07/24/2017, 09/06/2018   Influenza-Unspecified 06/23/2012, 06/27/2017   Meningococcal Conjugate 10/04/2012   PFIZER(Purple Top)SARS-COV-2 Vaccination 11/12/2019, 12/03/2019, 09/21/2020    Pneumococcal Conjugate-13 04/17/2016   Pneumococcal Polysaccharide-23 04/20/2017   Td 10/24/2003   Tdap 10/04/2012    TDAP status: Up to date  Flu Vaccine status: Up to date  Pneumococcal vaccine status: Up to date  Covid-19 vaccine status: Completed vaccines  Qualifies for Shingles Vaccine? Yes   Zostavax completed No   Shingrix Completed?: No.    Education has been provided regarding the importance of this vaccine. Patient has been advised to call insurance company to determine out of pocket expense if they have not yet received this vaccine. Advised may also receive vaccine at local pharmacy or Health Dept. Verbalized acceptance and understanding.  Screening Tests Health Maintenance  Topic Date Due   Zoster Vaccines- Shingrix (1 of 2) Never done   DEXA SCAN  08/30/2018   COVID-19 Vaccine (4 - Booster for Pfizer series) 11/16/2020   MAMMOGRAM  02/22/2021   Hepatitis C Screening  02/03/2022 (Originally 06/11/1964)   TETANUS/TDAP  10/04/2022   COLONOSCOPY (Pts 45-31yrs Insurance coverage will need to be confirmed)  03/05/2025   Pneumonia Vaccine 59+ Years old  Completed   INFLUENZA VACCINE  Completed   HPV VACCINES  Aged Out    Health Maintenance  Health Maintenance Due  Topic Date Due   Zoster Vaccines- Shingrix (1 of 2) Never done   DEXA SCAN  08/30/2018   COVID-19 Vaccine (4 - Booster for [place] series) 11/16/2020   MAMMOGRAM  02/22/2021    Colorectal cancer screening: Type of screening: Colonoscopy. Completed 03/05/2020. Repeat every 5 years  Mammogram status: Ordered 10/06/2021. Pt provided  with contact info and advised to call to schedule appt.   Bone Density status: Ordered 04/141/2022. Pt provided with contact info and advised to call to schedule appt.  Lung Cancer Screening: (Low Dose CT Chest recommended if Age 8-80 years, 30 pack-year currently smoking OR have quit w/in 15years.) does not qualify.   Lung Cancer Screening Referral: n/a  Additional  Screening:  Hepatitis C Screening: does qualify;   Vision Screening: Recommended annual ophthalmology exams for early detection of glaucoma and other disorders of the eye. Is the patient up to date with their annual eye exam?  Yes  Who is the provider or what is the name of the office in which the patient attends annual eye exams? Dr.Butterfield  If pt is not established with a provider, would they like to be referred to a provider to establish care? No .   Dental Screening: Recommended annual dental exams for proper oral hygiene  Community Resource Referral / Chronic Care Management: CRR required this visit?  No   CCM required this visit?  No      Plan:     I have personally reviewed and noted the following in the patients chart:   Medical and social history Use of alcohol, tobacco or illicit drugs  Current medications and supplements including opioid prescriptions.  Functional ability and status Nutritional status Physical activity Advanced directives List of other physicians Hospitalizations, surgeries, and ER visits in previous 12 months Vitals Screenings to include cognitive, depression, and falls Referrals and appointments  In addition, I have reviewed and discussed with patient certain preventive protocols, quality metrics, and best practice recommendations. A written personalized care plan for preventive services as well as general preventive health recommendations were provided to patient.     Randel Pigg, LPN   [id number]/07/[id number]   Nurse Notes: none

## 2021-10-06 NOTE — Patient Instructions (Signed)
Kristine Olson , Thank you for taking time to come for your Medicare Wellness Visit. I appreciate your ongoing commitment to your health goals. Please review the following plan we discussed and let me know if I can assist you in the future.   Screening recommendations/referrals: Colonoscopy: 03/05/2020  due 2026 Mammogram: referral completed  Bone Density: referral completed  Recommended yearly ophthalmology/optometry visit for glaucoma screening and checkup Recommended yearly dental visit for hygiene and checkup  Vaccinations: Influenza vaccine: completed  Pneumococcal vaccine: completed  Tdap vaccine: 10/04/2012 Shingles vaccine: declined     Advanced directives: will provide copies   Conditions/risks identified: none   Next appointment: none    Preventive Care 79 Years and Older, Female Preventive care refers to lifestyle choices and visits with your health care provider that can promote health and wellness. What does preventive care include? A yearly physical exam. This is also called an annual well check. Dental exams once or twice a year. Routine eye exams. Ask your health care provider how often you should have your eyes checked. Personal lifestyle choices, including: Daily care of your teeth and gums. Regular physical activity. Eating a healthy diet. Avoiding tobacco and drug use. Limiting alcohol use. Practicing safe sex. Taking low-dose aspirin every day. Taking vitamin and mineral supplements as recommended by your health care provider. What happens during an annual well check? The services and screenings done by your health care provider during your annual well check will depend on your age, overall health, lifestyle risk factors, and family history of disease. Counseling  Your health care provider may ask you questions about your: Alcohol use. Tobacco use. Drug use. Emotional well-being. Home and relationship well-being. Sexual activity. Eating  habits. History of falls. Memory and ability to understand (cognition). Work and work Statistician. Reproductive health. Screening  You may have the following tests or measurements: Height, weight, and BMI. Blood pressure. Lipid and cholesterol levels. These may be checked every 5 years, or more frequently if you are over 68 years old. Skin check. Lung cancer screening. You may have this screening every year starting at age 70 if you have a 30-pack-year history of smoking and currently smoke or have quit within the past 15 years. Fecal occult blood test (FOBT) of the stool. You may have this test every year starting at age 57. Flexible sigmoidoscopy or colonoscopy. You may have a sigmoidoscopy every 5 years or a colonoscopy every 10 years starting at age 63. Hepatitis C blood test. Hepatitis B blood test. Sexually transmitted disease (STD) testing. Diabetes screening. This is done by checking your blood sugar (glucose) after you have not eaten for a while (fasting). You may have this done every 1-3 years. Bone density scan. This is done to screen for osteoporosis. You may have this done starting at age 47. Mammogram. This may be done every 1-2 years. Talk to your health care provider about how often you should have regular mammograms. Talk with your health care provider about your test results, treatment options, and if necessary, the need for more tests. Vaccines  Your health care provider may recommend certain vaccines, such as: Influenza vaccine. This is recommended every year. Tetanus, diphtheria, and acellular pertussis (Tdap, Td) vaccine. You may need a Td booster every 10 years. Zoster vaccine. You may need this after age 61. Pneumococcal 13-valent conjugate (PCV13) vaccine. One dose is recommended after age 36. Pneumococcal polysaccharide (PPSV23) vaccine. One dose is recommended after age 32. Talk to your health care provider about which  screenings and vaccines you need and how  often you need them. This information is not intended to replace advice given to you by your health care provider. Make sure you discuss any questions you have with your health care provider. Document Released: 11/05/2015 Document Revised: 06/28/2016 Document Reviewed: 08/10/2015 Elsevier Interactive Patient Education  2017 [place] Prevention in the Home Falls can cause injuries. They can happen to people of all ages. There are many things you can do to make your home safe and to help prevent falls. What can I do on the outside of my home? Regularly fix the edges of walkways and driveways and fix any cracks. Remove anything that might make you trip as you walk through a door, such as a raised step or threshold. Trim any bushes or trees on the path to your home. Use bright outdoor lighting. Clear any walking paths of anything that might make someone trip, such as rocks or tools. Regularly check to see if handrails are loose or broken. Make sure that both sides of any steps have handrails. Any raised decks and porches should have guardrails on the edges. Have any leaves, snow, or ice cleared regularly. Use sand or salt on walking paths during winter. Clean up any spills in your garage right away. This includes oil or grease spills. What can I do in the bathroom? Use night lights. Install grab bars by the toilet and in the tub and shower. Do not use towel bars as grab bars. Use non-skid mats or decals in the tub or shower. If you need to sit down in the shower, use a plastic, non-slip stool. Keep the floor dry. Clean up any water that spills on the floor as soon as it happens. Remove soap buildup in the tub or shower regularly. Attach bath mats securely with double-sided non-slip rug tape. Do not have throw rugs and other things on the floor that can make you trip. What can I do in the bedroom? Use night lights. Make sure that you have a light by your bed that is easy to  reach. Do not use any sheets or blankets that are too big for your bed. They should not hang down onto the floor. Have a firm chair that has side arms. You can use this for support while you get dressed. Do not have throw rugs and other things on the floor that can make you trip. What can I do in the kitchen? Clean up any spills right away. Avoid walking on wet floors. Keep items that you use a lot in easy-to-reach places. If you need to reach something above you, use a strong step stool that has a grab bar. Keep electrical cords out of the way. Do not use floor polish or wax that makes floors slippery. If you must use wax, use non-skid floor wax. Do not have throw rugs and other things on the floor that can make you trip. What can I do with my stairs? Do not leave any items on the stairs. Make sure that there are handrails on both sides of the stairs and use them. Fix handrails that are broken or loose. Make sure that handrails are as long as the stairways. Check any carpeting to make sure that it is firmly attached to the stairs. Fix any carpet that is loose or worn. Avoid having throw rugs at the top or bottom of the stairs. If you do have throw rugs, attach them to the floor with carpet  tape. Make sure that you have a light switch at the top of the stairs and the bottom of the stairs. If you do not have them, ask someone to add them for you. What else can I do to help prevent falls? Wear shoes that: Do not have high heels. Have rubber bottoms. Are comfortable and fit you well. Are closed at the toe. Do not wear sandals. If you use a stepladder: Make sure that it is fully opened. Do not climb a closed stepladder. Make sure that both sides of the stepladder are locked into place. Ask someone to hold it for you, if possible. Clearly mark and make sure that you can see: Any grab bars or handrails. First and last steps. Where the edge of each step is. Use tools that help you move  around (mobility aids) if they are needed. These include: Canes. Walkers. Scooters. Crutches. Turn on the lights when you go into a dark area. Replace any light bulbs as soon as they burn out. Set up your furniture so you have a clear path. Avoid moving your furniture around. If any of your floors are uneven, fix them. If there are any pets around you, be aware of where they are. Review your medicines with your doctor. Some medicines can make you feel dizzy. This can increase your chance of falling. Ask your doctor what other things that you can do to help prevent falls. This information is not intended to replace advice given to you by your health care provider. Make sure you discuss any questions you have with your health care provider. Document Released: 08/05/2009 Document Revised: 03/16/2016 Document Reviewed: 11/13/2014 Elsevier Interactive Patient Education  2017 [employer].

## 2021-10-21 ENCOUNTER — Encounter: Payer: Self-pay | Admitting: Family Medicine

## 2021-10-21 ENCOUNTER — Other Ambulatory Visit: Payer: Self-pay

## 2021-10-21 ENCOUNTER — Ambulatory Visit: Payer: Medicare PPO | Admitting: Family Medicine

## 2021-10-21 VITALS — BP 130/80 | HR 76 | Temp 98.3°F | Ht 63.0 in | Wt 220.1 lb

## 2021-10-21 DIAGNOSIS — R310 Gross hematuria: Secondary | ICD-10-CM | POA: Diagnosis not present

## 2021-10-21 DIAGNOSIS — M545 Low back pain, unspecified: Secondary | ICD-10-CM | POA: Diagnosis not present

## 2021-10-21 DIAGNOSIS — N939 Abnormal uterine and vaginal bleeding, unspecified: Secondary | ICD-10-CM | POA: Diagnosis not present

## 2021-10-21 LAB — POCT URINALYSIS DIPSTICK
Bilirubin, UA: NEGATIVE
Blood, UA: NEGATIVE
Glucose, UA: NEGATIVE
Ketones, UA: NEGATIVE
Leukocytes, UA: NEGATIVE
Nitrite, UA: NEGATIVE
Protein, UA: NEGATIVE
Spec Grav, UA: 1.015 (ref 1.010–1.025)
Urobilinogen, UA: 0.2 E.U./dL
pH, UA: 6 (ref 5.0–8.0)

## 2021-10-21 NOTE — Progress Notes (Signed)
Subjective:     Patient ID: Kristine Olson, female    DOB: 18-Dec-1945, 75 y.o.   MRN: [id number]  Chief Complaint  Patient presents with   Back Pain    Lower back pain, dull and constant     HPI Low back pain for 2 months.on pred.  Finished 3 wks ago-better.Has disc dx diagnoses sev decades ago.  This residual is different Sev days after finished pred, dull lower back pain all across lower back. Spot of blood on pad of poise pads sev days ago. Bright red and ?mucus.  No urinary complaints. Low back-dull ache. No rad. Can't walk/stand long periods. Not keeping awake. Not taking meds. Chronic numbness R toe/knee-not worse.  No f/c/abd pain.   States has had UTI in past and UA neg.  Health Maintenance Due  Topic Date Due   Zoster Vaccines- Shingrix (1 of 2) Never done   DEXA SCAN  08/30/2018   MAMMOGRAM  02/22/2021    Past Medical History:  Diagnosis Date   Acute pyloric channel ulcer with hemorrhage on Xarelto    Anemia    history of anemia after bleeding ulcer   Arthritis    ostearthritis - right  medial knee   Cataract    beginning cataracts   Complication of anesthesia    was given a medication while waiting to go to surgery that caused burning sensation on scalp was given another medication and the burning went away   Depression    GERD (gastroesophageal reflux disease)    "heartburn daily"-TUMS helpful   History of blood transfusion    2 units PRBC   Hx of sinus tachycardia    rarely any problems since metoprolol use   Hyperlipidemia    patient unaware, has never taking medication   Hypertension    Hypothyroidism    Osteoporosis    osteopenia   Sleep apnea    uses c pap   Varicose vein of leg     Past Surgical History:  Procedure Laterality Date   BIOPSY BREAST     2 biopsies & 2 aspirations   CESAREAN SECTION     CLOSED REDUCTION TOE FRACTURE     COLONOSCOPY  2015   COLONOSCOPY W/ POLYPECTOMY  multiple since 2007   adenomas,  diverticulosis;last 2015   ESOPHAGOGASTRODUODENOSCOPY (EGD) WITH PROPOFOL N/A 05/29/2016   Procedure: ESOPHAGOGASTRODUODENOSCOPY (EGD) WITH PROPOFOL;  Surgeon: Gatha Mayer, MD;  Location: [facility];  Service: Endoscopy;  Laterality: N/A;   HAMMER TOE SURGERY     PARTIAL KNEE ARTHROPLASTY Right 05/22/2016   Procedure: RIGHT KNEE MEDIAL UNICOMPARTMENTAL ARTHROPLASTY;  Surgeon: Gaynelle Arabian, MD;  Location: [facility];  Service: Orthopedics;  Laterality: Right;   TONSILLECTOMY AND ADENOIDECTOMY     TOTAL KNEE ARTHROPLASTY Left 08/27/2017   Procedure: LEFT TOTAL KNEE ARTHROPLASTY;  Surgeon: Gaynelle Arabian, MD;  Location: [facility];  Service: Orthopedics;  Laterality: Left;   WISDOM TOOTH EXTRACTION      Outpatient Medications Prior to Visit  Medication Sig Dispense Refill   cetirizine (ZYRTEC) 10 MG tablet Take 10 mg by mouth daily as needed for allergies.     cholecalciferol (VITAMIN D) 1000 units tablet Take 1,000 Units by mouth daily.     citalopram (CELEXA) 20 MG tablet Take 20 mg by mouth daily.     cyclobenzaprine (FLEXERIL) 5 MG tablet Take 1 tablet (5 mg total) by mouth 3 (three) times daily as needed for muscle spasms. 30 tablet 1   levothyroxine (  SYNTHROID) 112 MCG tablet TAKE 1 TABLET DAILY EXCEPT FOR 1 AND 1/2 TABLETS ON WEDNESDAY 97 tablet 1   metoprolol tartrate (LOPRESSOR) 25 MG tablet TAKE 0.5 TABLETS BY MOUTH 2 TIMES DAILY. 90 tablet 1   pantoprazole (PROTONIX) 20 MG tablet TAKE 1 TABLET BY MOUTH EVERY DAY 90 tablet 1   spironolactone (ALDACTONE) 25 MG tablet TAKE 1 TABLET BY MOUTH EVERY DAY 90 tablet 1   predniSONE (DELTASONE) 10 MG tablet 3 tabs x3 days and then 2 tabs x3 days and then 1 tab x3 days.  Take w/ food. (Patient not taking: Reported on 10/21/2021) 18 tablet 0   No facility-administered medications prior to visit.    Allergies  Allergen Reactions   Cefaclor Rash    Because of a history of documented adverse serious drug reaction;Medi Alert bracelet  is recommended    Penicillins     REACTION: rash Because of a history of documented adverse serious drug reaction;Medi Alert bracelet  is recommended Has patient had a PCN reaction causing immediate rash, facial/tongue/throat swelling, SOB or lightheadedness with hypotension: no Has patient had a PCN reaction causing severe rash involving mucus membranes or skin necrosis: {no Has patient had a PCN reaction that required hospitalization no Has patient had a PCN reaction occurring within the last 10 years: no If all of the above ans   Sulfa Antibiotics Rash    01/22/13 after 9 days of Sulfa Because of a history of documented adverse serious drug reaction;Medi Alert bracelet  is recommended   Advair Diskus [Fluticasone-Salmeterol]     "Felt weird" and flushing.   ENI:[id number] except as noted in HPI      Objective:     BP 130/80    Pulse 76    Temp 98.3 F (36.8 C) (Temporal)    Ht 5\' 3"  (1.6 m)    Wt 220 lb 2 oz (99.8 kg)    SpO2 96%    BMI 38.99 kg/m  Wt Readings from Last 3 Encounters:  10/21/21 220 lb 2 oz (99.8 kg)  09/13/21 212 lb 12.8 oz (96.5 kg)  09/05/21 214 lb 6.4 oz (97.3 kg)        Gen: [facility] NAD [facility] HEENT: NCAT, conjunctiva not injected, sclera nonicteric NECK:  supple, no thyromegaly, no nodes, no carotid bruits CARDIAC: RRR, S1S2+, no murmur. DP 2+B LUNGS: CTAB. No wheezes ABDOMEN:  BS+, soft, NTND, No HSM, no masses EXT:  no edema MSK: Back: no CVAT, no pain to palp.  FROM. Can stand on heels/toes/1 leg. MS 5/5 BLE.  SLR neg B.  NEURO: A&O x3.  CN II-XII intact.  PSYCH: normal mood. Good eye contact  Results for orders placed or performed in visit on 10/21/21  POCT urinalysis dipstick  Result Value Ref Range   Color, UA YELLOW    Clarity, UA CLEAR    Glucose, UA Negative Negative   Bilirubin, UA NEGATIVE    Ketones, UA NEGATIVE    Spec Grav, UA 1.015 1.010 - 1.025   Blood, UA NEGATIVE    pH, UA 6.0 5.0 - 8.0   Protein, UA Negative Negative    Urobilinogen, UA 0.2 0.2 or 1.0 E.U./dL   Nitrite, UA NEGATIVE    Leukocytes, UA Negative Negative   Appearance     Odor NONE      Assessment & Plan:   Problem List Items Addressed This Visit   None Visit Diagnoses     Bilateral low back pain, unspecified chronicity, unspecified whether  sciatica present    -  Primary   Relevant Orders   POCT urinalysis dipstick (Completed)   Urine Culture   DG Abd 1 View   DG Lumbar Spine 2-3 Views   CBC   Comprehensive metabolic panel   Vaginal bleeding       Relevant Orders   Urine Culture   DG Abd 1 View   US Pelvic Complete With Transvaginal   CBC   Comprehensive metabolic panel      LBP-may still be residual from sciatica.  Doubt UTI, but will send for Cx.  Check labs as Cr has been abn in past.  Check Xray abd and lower back as still present and poss vag spotting (but could be from hemorrhoids as well).  Mixed picture.  Tylenol for pain, heat, icey hot. Limit NSAIDS as borderline kidney function per pt  Vag bleeding vs hemorrhoids or urine.  Will check pelvic u/s to r/o endometrium.  No orders of the defined types were placed in this encounter.   Wellington Hampshire., MD

## 2021-10-21 NOTE — Patient Instructions (Signed)
Icey hot, heat/ice You can take tylenol for pain/fevers If worsening symptoms, let us know or go to the Emergency room

## 2021-10-21 NOTE — Addendum Note (Signed)
Addended by: Zacarias Pontes on: 10/21/2021 03:06 PM   Modules accepted: Orders

## 2021-10-21 NOTE — Addendum Note (Signed)
Addended by: Cranston Neighbor on: 10/21/2021 03:32 PM   Modules accepted: Orders

## 2021-10-22 LAB — URINE CULTURE
MICRO NUMBER:: 12813639
Result:: NO GROWTH
SPECIMEN QUALITY:: ADEQUATE

## 2021-10-25 ENCOUNTER — Ambulatory Visit
Admission: RE | Admit: 2021-10-25 | Discharge: 2021-10-25 | Disposition: A | Discharge status: Home / Self Care | Payer: Medicare PPO | Source: Ambulatory Visit | Attending: Family Medicine | Admitting: Family Medicine

## 2021-10-25 ENCOUNTER — Ambulatory Visit (INDEPENDENT_AMBULATORY_CARE_PROVIDER_SITE_OTHER)
Admission: RE | Admit: 2021-10-25 | Discharge: 2021-10-25 | Disposition: A | Discharge status: Home / Self Care | Payer: Medicare PPO | Source: Ambulatory Visit | Attending: Family Medicine | Admitting: Family Medicine

## 2021-10-25 ENCOUNTER — Other Ambulatory Visit (INDEPENDENT_AMBULATORY_CARE_PROVIDER_SITE_OTHER): Payer: Medicare PPO

## 2021-10-25 ENCOUNTER — Other Ambulatory Visit: Payer: Self-pay

## 2021-10-25 DIAGNOSIS — R109 Unspecified abdominal pain: Secondary | ICD-10-CM | POA: Diagnosis not present

## 2021-10-25 DIAGNOSIS — M4126 Other idiopathic scoliosis, lumbar region: Secondary | ICD-10-CM | POA: Diagnosis not present

## 2021-10-25 DIAGNOSIS — N939 Abnormal uterine and vaginal bleeding, unspecified: Secondary | ICD-10-CM | POA: Diagnosis not present

## 2021-10-25 DIAGNOSIS — M545 Low back pain, unspecified: Secondary | ICD-10-CM

## 2021-10-25 DIAGNOSIS — M48061 Spinal stenosis, lumbar region without neurogenic claudication: Secondary | ICD-10-CM | POA: Diagnosis not present

## 2021-10-25 DIAGNOSIS — N950 Postmenopausal bleeding: Secondary | ICD-10-CM | POA: Diagnosis not present

## 2021-10-25 LAB — CBC
HCT: 34.1 % — ABNORMAL LOW (ref 36.0–46.0)
Hemoglobin: 10.8 g/dL — ABNORMAL LOW (ref 12.0–15.0)
MCHC: 31.6 g/dL (ref 30.0–36.0)
MCV: 77.7 fl — ABNORMAL LOW (ref 78.0–100.0)
Platelets: 256 10*3/uL (ref 150.0–400.0)
RBC: 4.39 Mil/uL (ref 3.87–5.11)
RDW: 17.6 % — ABNORMAL HIGH (ref 11.5–15.5)
WBC: 8 10*3/uL (ref 4.0–10.5)

## 2021-10-25 LAB — COMPREHENSIVE METABOLIC PANEL
ALT: 11 U/L (ref 0–35)
AST: 18 U/L (ref 0–37)
Albumin: 4 g/dL (ref 3.5–5.2)
Alkaline Phosphatase: 54 U/L (ref 39–117)
BUN: 17 mg/dL (ref 6–23)
CO2: 29 mEq/L (ref 19–32)
Calcium: 9.5 mg/dL (ref 8.4–10.5)
Chloride: 101 mEq/L (ref 96–112)
Creatinine, Ser: 1.28 mg/dL — ABNORMAL HIGH (ref 0.40–1.20)
GFR: 41.6 mL/min — ABNORMAL LOW (ref >60.00)
Glucose, Bld: 69 mg/dL — ABNORMAL LOW (ref 70–99)
Potassium: 3.9 mEq/L (ref 3.5–5.1)
Sodium: 137 mEq/L (ref 135–145)
Total Bilirubin: 0.4 mg/dL (ref 0.2–1.2)
Total Protein: 7.3 g/dL (ref 6.0–8.3)

## 2021-10-26 ENCOUNTER — Encounter: Payer: Self-pay | Admitting: Family Medicine

## 2021-11-14 ENCOUNTER — Ambulatory Visit: Payer: Medicare PPO | Admitting: Family Medicine

## 2021-11-14 ENCOUNTER — Encounter: Payer: Self-pay | Admitting: Family Medicine

## 2021-11-14 VITALS — BP 118/74 | HR 70 | Resp 16 | Ht 63.5 in | Wt 217.2 lb

## 2021-11-14 DIAGNOSIS — D509 Iron deficiency anemia, unspecified: Secondary | ICD-10-CM

## 2021-11-14 DIAGNOSIS — N939 Abnormal uterine and vaginal bleeding, unspecified: Secondary | ICD-10-CM

## 2021-11-14 DIAGNOSIS — M545 Low back pain, unspecified: Secondary | ICD-10-CM

## 2021-11-14 MED ORDER — TIZANIDINE HCL 4 MG PO TABS: 4.0000 mg | ORAL_TABLET | Freq: Three times a day (TID) | ORAL | 0 refills | Status: DC | PRN

## 2021-11-14 MED ORDER — PREDNISONE 10 MG PO TABS: ORAL_TABLET | ORAL | 0 refills | Status: DC

## 2021-11-14 NOTE — Patient Instructions (Signed)
Follow up by phone or MyChart at the end of the week and let me know how the back is doing We'll notify you of your lab results and make any changes if needed START the Prednisone as directed- take w/ food USE the Tizanidine as needed for spasm HEAT for pain relief Call with any questions or concerns Hang in there!!!

## 2021-11-14 NOTE — Progress Notes (Signed)
° °  Subjective:    Patient ID: Kristine Olson, female    DOB: July 26, 1946, 76 y.o.   MRN: [id number]  HPI Vaginal spotting- pt saw Dr Franz Dell on 32/99.  Had pelvic US that was unrevealing.  Has not recurred  Back pain- pt has been seen multiple times for this (11/22 and 12/30).  Was treated w/ Prednisone and Flexeril.  Now has constant dull pain.  Worse w/ standing for any period of time or bending over.  No change to bowel or bladder habits.  UA WNL.  Lumbar xrays show mild DDD.  No radiation of pain.  No sciatica.    Anemia- pt's Hgb was 10.8 on 10/25/21.  Did have an episode of vaginal spotting but denies heavy bleeding.  No blood in stool.   Review of Systems For ROS see HPI   This visit occurred during the SARS-CoV-2 public health emergency.  Safety protocols were in place, including screening questions prior to the visit, additional usage of staff PPE, and extensive cleaning of exam room while observing appropriate contact time as indicated for disinfecting solutions.      Objective:   Physical Exam Vitals reviewed.  Constitutional:      General: She is not in acute distress.    Appearance: Normal appearance. She is well-developed. She is obese. She is not ill-appearing.  HENT:     Head: Normocephalic and atraumatic.  Eyes:     Extraocular Movements: Extraocular movements intact.     Conjunctiva/sclera: Conjunctivae normal.     Pupils: Pupils are equal, round, and reactive to light.  Neck:     Thyroid: No thyromegaly.  Cardiovascular:     Rate and Rhythm: Normal rate and regular rhythm.     Pulses: Normal pulses.     Heart sounds: Normal heart sounds. No murmur heard. Pulmonary:     Effort: Pulmonary effort is normal. No respiratory distress.     Breath sounds: Normal breath sounds.  Abdominal:     General: There is no distension.     Palpations: Abdomen is soft.     Tenderness: There is no abdominal tenderness.  Musculoskeletal:        General: Tenderness (TTP over SI  joints bilaterally) present.     Cervical back: Normal range of motion and neck supple.     Right lower leg: No edema.     Left lower leg: No edema.  Lymphadenopathy:     Cervical: No cervical adenopathy.  Skin:    General: Skin is warm and dry.  Neurological:     Mental Status: She is alert and oriented to person, place, and time.  Psychiatric:        Behavior: Behavior normal.          Assessment & Plan:   Bilateral lower back pain- new.  Pt reports this is different from the sciatica she has been experiencing.  Pain is now constant and described as a dull ache that will episodically worsen at times.  Recent xray showed mild DDD.  Her previous prednisone regimen was likely insufficient.  Will repeat at a slightly higher dose.  If no improvement w/ prednisone, will progress to PT.  Pt expressed understanding and is in agreement w/ plan.   Vaginal bleeding- thankfully resolved.  Normal pelvic US.  No further work up at this time.

## 2021-11-15 ENCOUNTER — Other Ambulatory Visit: Payer: Self-pay | Admitting: Family Medicine

## 2021-11-15 LAB — IRON,TIBC AND FERRITIN PANEL
%SAT: 5 % (calc) — ABNORMAL LOW (ref 16–45)
Ferritin: 5 ng/mL — ABNORMAL LOW (ref 16–288)
Iron: 26 ug/dL — ABNORMAL LOW (ref 45–160)
TIBC: 475 mcg/dL (calc) — ABNORMAL HIGH (ref 250–450)

## 2021-11-15 NOTE — Assessment & Plan Note (Signed)
Pt has hx of similar.  Will get repeat CBC and iron panel today.  If CBC has decreased, will do iFOB to r/o GI blood loss.  Pt expressed understanding and is in agreement w/ plan.

## 2021-11-18 ENCOUNTER — Other Ambulatory Visit: Payer: Self-pay | Admitting: Family Medicine

## 2021-11-18 ENCOUNTER — Other Ambulatory Visit (INDEPENDENT_AMBULATORY_CARE_PROVIDER_SITE_OTHER): Payer: Medicare PPO

## 2021-11-18 DIAGNOSIS — D509 Iron deficiency anemia, unspecified: Secondary | ICD-10-CM | POA: Diagnosis not present

## 2021-11-18 LAB — CBC WITH DIFFERENTIAL/PLATELET
Basophils Absolute: 0.1 10*3/uL (ref 0.0–0.1)
Basophils Relative: 1 % (ref 0.0–3.0)
Eosinophils Absolute: 0.2 10*3/uL (ref 0.0–0.7)
Eosinophils Relative: 3.1 % (ref 0.0–5.0)
HCT: 31.8 % — ABNORMAL LOW (ref 36.0–46.0)
Hemoglobin: 10.1 g/dL — ABNORMAL LOW (ref 12.0–15.0)
Lymphocytes Relative: 28.8 % (ref 12.0–46.0)
Lymphs Abs: 2.2 10*3/uL (ref 0.7–4.0)
MCHC: 31.6 g/dL (ref 30.0–36.0)
MCV: 77.5 fl — ABNORMAL LOW (ref 78.0–100.0)
Monocytes Absolute: 0.6 10*3/uL (ref 0.1–1.0)
Monocytes Relative: 8.1 % (ref 3.0–12.0)
Neutro Abs: 4.4 10*3/uL (ref 1.4–7.7)
Neutrophils Relative %: 59 % (ref 43.0–77.0)
Platelets: 209 10*3/uL (ref 150.0–400.0)
RBC: 4.1 Mil/uL (ref 3.87–5.11)
RDW: 17.1 % — ABNORMAL HIGH (ref 11.5–15.5)
WBC: 7.5 10*3/uL (ref 4.0–10.5)

## 2022-01-16 DIAGNOSIS — F411 Generalized anxiety disorder: Secondary | ICD-10-CM | POA: Diagnosis not present

## 2022-02-08 ENCOUNTER — Other Ambulatory Visit: Payer: Self-pay

## 2022-02-08 ENCOUNTER — Telehealth: Payer: Self-pay

## 2022-02-08 ENCOUNTER — Ambulatory Visit (INDEPENDENT_AMBULATORY_CARE_PROVIDER_SITE_OTHER): Payer: Medicare PPO | Admitting: Family Medicine

## 2022-02-08 ENCOUNTER — Encounter: Payer: Self-pay | Admitting: Family Medicine

## 2022-02-08 VITALS — BP 122/74 | HR 68 | Temp 98.4°F | Resp 19 | Ht 63.5 in | Wt 221.8 lb

## 2022-02-08 DIAGNOSIS — H9193 Unspecified hearing loss, bilateral: Secondary | ICD-10-CM

## 2022-02-08 DIAGNOSIS — E559 Vitamin D deficiency, unspecified: Secondary | ICD-10-CM | POA: Diagnosis not present

## 2022-02-08 DIAGNOSIS — Z0000 Encounter for general adult medical examination without abnormal findings: Secondary | ICD-10-CM | POA: Diagnosis not present

## 2022-02-08 DIAGNOSIS — Z1159 Encounter for screening for other viral diseases: Secondary | ICD-10-CM

## 2022-02-08 LAB — BASIC METABOLIC PANEL
BUN: 15 mg/dL (ref 6–23)
CO2: 28 mEq/L (ref 19–32)
Calcium: 9.2 mg/dL (ref 8.4–10.5)
Chloride: 103 mEq/L (ref 96–112)
Creatinine, Ser: 1.2 mg/dL (ref 0.40–1.20)
GFR: 44.86 mL/min — ABNORMAL LOW (ref >60.00)
Glucose, Bld: 89 mg/dL (ref 70–99)
Potassium: 4.6 mEq/L (ref 3.5–5.1)
Sodium: 138 mEq/L (ref 135–145)

## 2022-02-08 LAB — LIPID PANEL
Cholesterol: 177 mg/dL (ref 0–200)
HDL: 69.6 mg/dL (ref >39.00)
LDL Cholesterol: 88 mg/dL (ref 0–99)
NonHDL: 107.56
Total CHOL/HDL Ratio: 3
Triglycerides: 100 mg/dL (ref 0.0–149.0)
VLDL: 20 mg/dL (ref 0.0–40.0)

## 2022-02-08 LAB — HEPATIC FUNCTION PANEL
ALT: 10 U/L (ref 0–35)
AST: 17 U/L (ref 0–37)
Albumin: 4 g/dL (ref 3.5–5.2)
Alkaline Phosphatase: 57 U/L (ref 39–117)
Bilirubin, Direct: 0.1 mg/dL (ref 0.0–0.3)
Total Bilirubin: 0.5 mg/dL (ref 0.2–1.2)
Total Protein: 6.9 g/dL (ref 6.0–8.3)

## 2022-02-08 LAB — CBC WITH DIFFERENTIAL/PLATELET
Basophils Absolute: 0.1 10*3/uL (ref 0.0–0.1)
Basophils Relative: 1.1 % (ref 0.0–3.0)
Eosinophils Absolute: 0.2 10*3/uL (ref 0.0–0.7)
Eosinophils Relative: 4.2 % (ref 0.0–5.0)
HCT: 40 % (ref 36.0–46.0)
Hemoglobin: 12.8 g/dL (ref 12.0–15.0)
Lymphocytes Relative: 17.5 % (ref 12.0–46.0)
Lymphs Abs: 1 10*3/uL (ref 0.7–4.0)
MCHC: 32.1 g/dL (ref 30.0–36.0)
MCV: 84.7 fl (ref 78.0–100.0)
Monocytes Absolute: 0.4 10*3/uL (ref 0.1–1.0)
Monocytes Relative: 7.4 % (ref 3.0–12.0)
Neutro Abs: 4.1 10*3/uL (ref 1.4–7.7)
Neutrophils Relative %: 69.8 % (ref 43.0–77.0)
Platelets: 199 10*3/uL (ref 150.0–400.0)
RBC: 4.72 Mil/uL (ref 3.87–5.11)
RDW: 18.9 % — ABNORMAL HIGH (ref 11.5–15.5)
WBC: 5.9 10*3/uL (ref 4.0–10.5)

## 2022-02-08 LAB — TSH: TSH: 0.95 u[IU]/mL (ref 0.35–5.50)

## 2022-02-08 LAB — VITAMIN D 25 HYDROXY (VIT D DEFICIENCY, FRACTURES): VITD: 31.76 ng/mL (ref 30.00–100.00)

## 2022-02-08 NOTE — Assessment & Plan Note (Signed)
Check labs and replete prn. 

## 2022-02-08 NOTE — Patient Instructions (Addendum)
Follow up in 6 months to recheck BP and thyroid ?We'll notify you of your lab results and make any changes ?Continue to work on healthy diet and regular physical activity such as walking or swimming ?Call and schedule your mammogram at your convenience ?We'll call you with your ENT appt ?Call with any questions or concerns ?Stay Safe!  Stay Healthy! ?Happy Spring!!! ?

## 2022-02-08 NOTE — Telephone Encounter (Signed)
Entered in error

## 2022-02-08 NOTE — Progress Notes (Signed)
? ?  Subjective:  ? ? Patient ID: Kristine Olson, female    DOB: 03/31/1946, 76 y.o.   MRN: [id number] ? ?HPI ?CPE- UTD on flu, Tdap, colonoscopy.  Mammo ordered but not scheduled ? ?Patient Care Team  ?  Relationship Specialty Notifications Start End  ?Midge Minium, MD PCP - General Family Medicine  03/02/16   ?Charlynn Grimes, MD Referring Physician Obstetrics and Gynecology  04/17/16   ?Gatha Mayer, MD Consulting Physician Gastroenterology  04/17/16   ?Marvia Pickles  Psychiatry  04/20/17   ?Gaynelle Arabian, MD Consulting Physician Orthopedic Surgery  04/20/17   ?Deneise Lever, MD Consulting Physician Pulmonary Disease  04/20/17   ?Minus Breeding, MD Consulting Physician Cardiology  04/24/18   ?Ludwig Clarks, [place] Physician Neurology  02/15/21   ?  ?Health Maintenance  ?Topic Date Due  ?? Hepatitis C Screening  Never done  ?? DEXA SCAN  08/30/2018  ?? MAMMOGRAM  02/22/2021  ?? Zoster Vaccines- Shingrix (1 of 2) 02/12/2022 (Originally 06/11/1998)  ?? COVID-19 Vaccine (4 - Booster for [place] series) 02/24/2022 (Originally 11/01/2020)  ?? INFLUENZA VACCINE  05/23/2022  ?? TETANUS/TDAP  10/04/2022  ?? COLONOSCOPY (Pts 45-15yr Insurance coverage will need to be confirmed)  03/05/2025  ?? Pneumonia Vaccine 76 Years old  Completed  ?? HPV VACCINES  Aged Out  ?  ? ? ?Review of Systems ?Patient reports no vision changes, adenopathy,fever, weight change,  persistant/recurrent hoarseness , swallowing issues, chest pain, palpitations, edema, persistant/recurrent cough, hemoptysis, dyspnea (rest/exertional/paroxysmal nocturnal), gastrointestinal bleeding (melena, rectal bleeding), abdominal pain, significant heartburn, bowel changes, GU symptoms (dysuria, hematuria, incontinence), Gyn symptoms (abnormal  bleeding, pain),  syncope, focal weakness, memory loss, numbness & tingling, skin/hair/nail changes, abnormal bruising or bleeding, anxiety, or depression.  ? ?+ decreased hearing ?   ?Objective:  ? Physical  Exam ?General Appearance:    Alert, cooperative, no distress, appears stated age, obese  ?Head:    Normocephalic, without obvious abnormality, atraumatic  ?Eyes:    PERRL, conjunctiva/corneas clear, EOM's intact, both eyes  ?Ears:    L ear tube in place, R TM WNL  ?Nose:   Nares normal, septum midline, mucosa normal, no drainage  ?  or sinus tenderness  ?Throat:   Lips, mucosa, and tongue normal; teeth and gums normal  ?Neck:   Supple, symmetrical, trachea midline, no adenopathy;  ?  Thyroid: no enlargement/tenderness/nodules  ?Back:     Symmetric, no curvature, ROM normal, no CVA tenderness  ?Lungs:     Clear to auscultation bilaterally, respirations unlabored  ?Chest Wall:    No tenderness or deformity  ? Heart:    Regular rate and rhythm, S1 and S2 normal, no murmur, rub ?  or gallop  ?Breast Exam:    Deferred to mammo  ?Abdomen:     Soft, non-tender, bowel sounds active all four quadrants,  ?  no masses, no organomegaly  ?Genitalia:    Deferred   ?Rectal:    ?Extremities:   Extremities normal, atraumatic, no cyanosis or edema  ?Pulses:   2+ and symmetric all extremities  ?Skin:   Skin color, texture, turgor normal, no rashes or lesions  ?Lymph nodes:   Cervical, supraclavicular, and axillary nodes normal  ?Neurologic:   CNII-XII intact, normal strength, sensation and reflexes  ?  throughout  ?  ? ? ? ?   ?Assessment & Plan:  ? ? ?

## 2022-02-08 NOTE — Assessment & Plan Note (Signed)
Pt's PE WNL w/ exception of obesity.  UTD on flu, Tdap.  UTD on colonoscopy.  Due for mammo- pt to schedule.  Check labs.  Anticipatory guidance provided.  ?

## 2022-02-08 NOTE — Assessment & Plan Note (Signed)
Pt's BMI today is 38.67 and coupled w/ her medical comorbidities, this qualifies as morbidly obese.  Encouraged low carb diet, regular exercise.  She is waiting on a call from [facility].  Will continue to follow. ?

## 2022-02-09 LAB — HEPATITIS C ANTIBODY
Hepatitis C Ab: NONREACTIVE
SIGNAL TO CUT-OFF: 0.04 (ref <1.00)

## 2022-02-10 ENCOUNTER — Encounter: Payer: Self-pay | Admitting: Family Medicine

## 2022-02-19 ENCOUNTER — Encounter: Payer: Self-pay | Admitting: Family Medicine

## 2022-02-20 ENCOUNTER — Telehealth: Payer: Self-pay | Admitting: Internal Medicine

## 2022-02-20 NOTE — Telephone Encounter (Signed)
Patient called stating that yesterday she had a significant amount of blood in her stool; however, today she has not seen any.  She is questioning whether or not she should make an appointment to come in for an office visit.  Please call patient and advise.  Thank you. ?

## 2022-02-20 NOTE — Telephone Encounter (Signed)
Pt stated that she had a Moderate amount of BRB on Sunday: Pt stated that she does have a hx of hemorrhoids: Pt stated that she did not have any BRP today: Pt was scheduled for an office visit with Dr. Carlean Purl on 03/01/2022 at 9:30: Pt made aware: Address Provided: ?Pt made aware if she does start to experience any more bleeding to please contact us or go don't hesitate to go to the ED for evaluation and treatment ?Pt verbalized understanding with all questions answered.  ? ?

## 2022-02-22 DIAGNOSIS — Z1231 Encounter for screening mammogram for malignant neoplasm of breast: Secondary | ICD-10-CM | POA: Diagnosis not present

## 2022-02-22 DIAGNOSIS — H524 Presbyopia: Secondary | ICD-10-CM | POA: Diagnosis not present

## 2022-02-22 DIAGNOSIS — H52223 Regular astigmatism, bilateral: Secondary | ICD-10-CM | POA: Diagnosis not present

## 2022-02-22 DIAGNOSIS — H5203 Hypermetropia, bilateral: Secondary | ICD-10-CM | POA: Diagnosis not present

## 2022-02-22 DIAGNOSIS — H35363 Drusen (degenerative) of macula, bilateral: Secondary | ICD-10-CM | POA: Diagnosis not present

## 2022-02-22 DIAGNOSIS — H2513 Age-related nuclear cataract, bilateral: Secondary | ICD-10-CM | POA: Diagnosis not present

## 2022-02-22 LAB — HM MAMMOGRAPHY

## 2022-02-27 ENCOUNTER — Encounter: Payer: Self-pay | Admitting: Family Medicine

## 2022-03-01 ENCOUNTER — Ambulatory Visit: Payer: Medicare PPO | Admitting: Internal Medicine

## 2022-03-01 ENCOUNTER — Encounter: Payer: Self-pay | Admitting: Internal Medicine

## 2022-03-01 VITALS — BP 118/88 | HR 65 | Ht 63.0 in | Wt 218.4 lb

## 2022-03-01 DIAGNOSIS — D509 Iron deficiency anemia, unspecified: Secondary | ICD-10-CM

## 2022-03-01 DIAGNOSIS — K219 Gastro-esophageal reflux disease without esophagitis: Secondary | ICD-10-CM

## 2022-03-01 DIAGNOSIS — K648 Other hemorrhoids: Secondary | ICD-10-CM | POA: Diagnosis not present

## 2022-03-01 DIAGNOSIS — Z6838 Body mass index (BMI) 38.0-38.9, adult: Secondary | ICD-10-CM

## 2022-03-01 NOTE — Patient Instructions (Addendum)
?  These are two books about intermittent fasting worth checking out. Dr. Luis Abed also has you tube channel - check out first. His book is more science and less how to. The other book is more how to. ? ?The Obesity Code by Dr. Sharman Cheek ? ?Intermittent Fasting Diet Guide and Cookbook by Dr. Alberteen Spindle ? ? ?Dr. Enrigue Catena is at [facility] and is a keto specialist - he also has a website and on-line presence worth checking out. ? ?I have provide a low-carb basics handout also. ? ?You have been scheduled for an endoscopy. Please follow written instructions given to you at your visit today. ?If you use inhalers (even only as needed), please bring them with you on the day of your procedure. ? ? ?Good luck and see you soon! ? ?I appreciate the opportunity to care for you. ?Gatha Mayer, MD, [facility] ? ?

## 2022-03-01 NOTE — Progress Notes (Signed)
? ?Kristine Olson 76 y.o. 1945/11/09 [id number] ? ?Assessment & Plan:  ? ?Encounter Diagnoses  ?Name Primary?  ? Bleeding internal hemorrhoids Yes  ? Iron deficiency anemia, unspecified iron deficiency anemia type   ? Gastroesophageal reflux disease, unspecified whether esophagitis present   ? BMI 38.0-38.9,adult   ? ?She is reassured by the diagnosis of bleeding hemorrhoids.  She will think about banding.  Handout provided on hemorrhoids and the banding procedure. ? ?Cause of iron deficiency anemia not clear.  I do not think it is hemorrhoidal bleeding.  She says she never took iron after her 2017 GI bleed so maybe over time she used up her stores?.  I think for completeness an EGD is appropriate.  Colonoscopy is recent enough to not repeat that. ? ?She does have GERD issues she is concerned about PPI.  PPI can actually cause malabsorption of iron through acid suppression I should say though not common possible.  At any rate she will continue her every other day PPI therapy and we will see if there is any esophagitis etc. and that will help guide therapy.  I have reassured her that the side effects reported with PPI that include dementia kidney failure and fractures are weak associations at best and we reviewed how correlation does not prove causality.  Weight loss efforts will help with GERD as well, see below. ? ?We talked about her efforts to lose weight and how it is understandable that that is a struggle and that the food environment and recommendations made by the government and nutrition experts have been backward and that they are higher carbohydrate and that is fueling metabolic health problems and obesity. ? ?She is encouraged to keep trying.  I have provided resources about lower carbohydrate eating and to consider time restricted eating/intermittent fasting.  She may have some food addiction issues which can be challenging but she is encouraged to keep trying and congratulated for her efforts so  far. ? ?I appreciate the opportunity to care for this patient. ?CC: Kristine Olson, Kristine Olson ? ? ?Subjective:  ? ?Chief Complaint: Rectal bleeding ? ?HPI ?76 year old white woman with a history of internal hemorrhoids and bleeding off and on who had 1 day of rectal bleeding with clots a couple of weeks ago.  It was concerning so she was recommended she return.  Previous colonoscopy 2021 with hemorrhoids, diverticulosis and a diminutive adenoma.  She has minor bleeding off and on for a number of years that we knew about this was a bit more.  More recently as well she had a spot of vaginal bleeding seen by primary care, was found to be iron deficient.  Anemic slightly.  Pelvic ultrasound negative.  No more bleeding from the vagina.  On iron supplementation with results as below.  In 2017 she had an EGD she had perhaps a small ulcer that was bleeding in the setting of Xarelto.  She has iron in her diet and she does not donate blood.  She has not seen signs of GI bleeding. ? ?She is using her pantoprazole about every other day because she is concerned about the potential side effects.  She takes Tums sometimes as well on the off days.  She does not have dysphagia but she is still having heartburn with this regimen.  We talked some about her obesity and her efforts to try to lose weight that have included Noom and weight watchers and she will lose weight regain.  She is on the waiting  list for the [place] [facility].  She does admit to overusing sugar and carbohydrates and understands that is a problem but is an emotional eater as well.  Caffeine usage is that of sweet tea.  A couple of times a day it sounds like. ?Lab Results  ?Component Value Date  ? IRON 26 (L) 11/14/2021  ? TIBC 475 (H) 11/14/2021  ? FERRITIN 5 (L) 11/14/2021  ? ? ?  Latest Ref Rng & Units 02/08/2022  ? 10:49 AM 11/18/2021  ? 10:49 AM 10/25/2021  ?  4:37 PM  ?CBC  ?WBC 4.0 - 10.5 K/uL 5.9   7.5   8.0    ?Hemoglobin 12.0 - 15.0 g/dL 12.8   10.1    10.8    ?Hematocrit 36.0 - 46.0 % 40.0   31.8   34.1    ?Platelets 150.0 - 400.0 K/uL 199.0   209.0   256.0    ? ? ?Allergies  ?Allergen Reactions  ? Cefaclor Rash  ?  Because of a history of documented adverse serious drug reaction;Medi Alert bracelet  is recommended  ? Penicillins   ?  REACTION: rash ?Because of a history of documented adverse serious drug reaction;Medi Alert bracelet  is recommended ?Has patient had a PCN reaction causing immediate rash, facial/tongue/throat swelling, SOB or lightheadedness with hypotension: no ?Has patient had a PCN reaction causing severe rash involving mucus membranes or skin necrosis: {no ?Has patient had a PCN reaction that required hospitalization no ?Has patient had a PCN reaction occurring within the last 10 years: no ?If all of the above ans  ? Sulfa Antibiotics Rash  ?  01/22/13 after 9 days of Sulfa ?Because of a history of documented adverse serious drug reaction;Medi Alert bracelet  is recommended  ? Advair Diskus [Fluticasone-Salmeterol]   ?  "Felt weird" and flushing.  ? ?Current Meds  ?Medication Sig  ? cetirizine (ZYRTEC) 10 MG tablet Take 10 mg by mouth daily as needed for allergies.  ? cholecalciferol (VITAMIN D) 1000 units tablet Take 1,000 Units by mouth daily.  ? citalopram (CELEXA) 20 MG tablet Take 20 mg by mouth daily.  ? levothyroxine (SYNTHROID) 112 MCG tablet TAKE 1 TABLET DAILY EXCEPT FOR 1 AND 1/2 TABLETS ON WEDNESDAY  ? metoprolol tartrate (LOPRESSOR) 25 MG tablet TAKE 1/2 TABLET BY MOUTH TWICE A DAY  ? Multiple Vitamins-Minerals (PRESERVISION AREDS 2 PO) Take 2 capsules by mouth daily.  ? pantoprazole (PROTONIX) 20 MG tablet TAKE 1 TABLET BY MOUTH EVERY DAY  ? spironolactone (ALDACTONE) 25 MG tablet TAKE 1 TABLET BY MOUTH EVERY DAY  ? ?Past Medical History:  ?Diagnosis Date  ? Acute pyloric channel ulcer with hemorrhage on Xarelto   ? Anemia   ? history of anemia after bleeding ulcer  ? Arthritis   ? ostearthritis - right  medial knee  ? Cataract    ? beginning cataracts  ? Complication of anesthesia   ? was given a medication while waiting to go to surgery that caused burning sensation on scalp was given another medication and the burning went away  ? COVID-19 2022  ? Depression   ? GERD (gastroesophageal reflux disease)   ? "heartburn daily"-TUMS helpful  ? Hemorrhoid   ? History of blood transfusion   ? 2 units PRBC  ? Hx of sinus tachycardia   ? rarely any problems since metoprolol use  ? Hyperlipidemia   ? patient unaware, has never taking medication  ? Hypertension   ?  Hypothyroidism   ? Osteoporosis   ? osteopenia  ? Sleep apnea   ? uses c pap  ? Varicose vein of leg   ? ?Past Surgical History:  ?Procedure Laterality Date  ? BIOPSY BREAST    ? 2 biopsies & 2 aspirations  ? CESAREAN SECTION    ? CLOSED REDUCTION TOE FRACTURE    ? COLONOSCOPY  2015  ? COLONOSCOPY W/ POLYPECTOMY  multiple since 2007  ? adenomas, diverticulosis;last 2015  ? ESOPHAGOGASTRODUODENOSCOPY (EGD) WITH PROPOFOL N/A 05/29/2016  ? Procedure: ESOPHAGOGASTRODUODENOSCOPY (EGD) WITH PROPOFOL;  Surgeon: Gatha Mayer, Kristine Olson;  Location: [facility];  Service: Endoscopy;  Laterality: N/A;  ? HAMMER TOE SURGERY    ? PARTIAL KNEE ARTHROPLASTY Right 05/22/2016  ? Procedure: RIGHT KNEE MEDIAL UNICOMPARTMENTAL ARTHROPLASTY;  Surgeon: Gaynelle Arabian, Kristine Olson;  Location: [facility];  Service: Orthopedics;  Laterality: Right;  ? TONSILLECTOMY AND ADENOIDECTOMY    ? TOTAL KNEE ARTHROPLASTY Left 08/27/2017  ? Procedure: LEFT TOTAL KNEE ARTHROPLASTY;  Surgeon: Gaynelle Arabian, Kristine Olson;  Location: [facility];  Service: Orthopedics;  Laterality: Left;  ? WISDOM TOOTH EXTRACTION    ? ?Social History  ? ?Social History Narrative  ? 3 biological children (Set of Identical twins)   ? 3 Step children   ? ?family history includes Alcohol abuse in her father; Bipolar disorder in her daughter; Cancer in her maternal grandmother; Deep vein thrombosis in her paternal aunt; Depression in her daughter; Diabetes in her maternal aunt; Heart  attack in her maternal grandfather; Hypertension in her father; Liver disease in her father; Melanoma in her brother; Multiple sclerosis (age of onset: 103) in her daughter; Osteoporosis in her mothe

## 2022-03-01 NOTE — Progress Notes (Deleted)
? ?Kristine Olson 76 y.o. Oct 21, 1946 [id number] ? ?Assessment & Plan:  ? ? ? ? ?Subjective:  ? ?Chief Complaint: ? ?HPI ? ?Had vaginal bleeding January 2023 ? ? ?  Latest Ref Rng & Units 02/08/2022  ? 10:49 AM 11/18/2021  ? 10:49 AM 10/25/2021  ?  4:37 PM  ?CBC  ?WBC 4.0 - 10.5 K/uL 5.9   7.5   8.0    ?Hemoglobin 12.0 - 15.0 g/dL 12.8   10.1   10.8    ?Hematocrit 36.0 - 46.0 % 40.0   31.8   34.1    ?Platelets 150.0 - 400.0 K/uL 199.0   209.0   256.0    ? ? ?Lab Results  ?Component Value Date  ? IRON 26 (L) 11/14/2021  ? TIBC 475 (H) 11/14/2021  ? FERRITIN 5 (L) 11/14/2021  ? ? ?Colonoscopy 03/04/2020 ?- One diminutive polyp in the descending colon, removed with a cold snare. Resected and ?retrieved. ?- Diverticulosis in the entire examined colon. ?- The examination was otherwise normal on direct and retroflexion views. Except does have ?mildly inflamed internal hemorrhoids. ?- Personal history of colonic polyps. ? ? ?Allergies  ?Allergen Reactions  ? Cefaclor Rash  ?  Because of a history of documented adverse serious drug reaction;Medi Alert bracelet  is recommended  ? Penicillins   ?  REACTION: rash ?Because of a history of documented adverse serious drug reaction;Medi Alert bracelet  is recommended ?Has patient had a PCN reaction causing immediate rash, facial/tongue/throat swelling, SOB or lightheadedness with hypotension: no ?Has patient had a PCN reaction causing severe rash involving mucus membranes or skin necrosis: {no ?Has patient had a PCN reaction that required hospitalization no ?Has patient had a PCN reaction occurring within the last 10 years: no ?If all of the above ans  ? Sulfa Antibiotics Rash  ?  01/22/13 after 9 days of Sulfa ?Because of a history of documented adverse serious drug reaction;Medi Alert bracelet  is recommended  ? Advair Diskus [Fluticasone-Salmeterol]   ?  "Felt weird" and flushing.  ? ?Current Meds  ?Medication Sig  ? cetirizine (ZYRTEC) 10 MG tablet Take 10 mg by mouth daily as  needed for allergies.  ? cholecalciferol (VITAMIN D) 1000 units tablet Take 1,000 Units by mouth daily.  ? citalopram (CELEXA) 20 MG tablet Take 20 mg by mouth daily.  ? levothyroxine (SYNTHROID) 112 MCG tablet TAKE 1 TABLET DAILY EXCEPT FOR 1 AND 1/2 TABLETS ON WEDNESDAY  ? metoprolol tartrate (LOPRESSOR) 25 MG tablet TAKE 1/2 TABLET BY MOUTH TWICE A DAY  ? Multiple Vitamins-Minerals (PRESERVISION AREDS 2 PO) Take 2 capsules by mouth daily.  ? pantoprazole (PROTONIX) 20 MG tablet TAKE 1 TABLET BY MOUTH EVERY DAY  ? spironolactone (ALDACTONE) 25 MG tablet TAKE 1 TABLET BY MOUTH EVERY DAY  ? ?Past Medical History:  ?Diagnosis Date  ? Acute pyloric channel ulcer with hemorrhage on Xarelto   ? Anemia   ? history of anemia after bleeding ulcer  ? Arthritis   ? ostearthritis - right  medial knee  ? Cataract   ? beginning cataracts  ? Complication of anesthesia   ? was given a medication while waiting to go to surgery that caused burning sensation on scalp was given another medication and the burning went away  ? Depression   ? GERD (gastroesophageal reflux disease)   ? "heartburn daily"-TUMS helpful  ? Hemorrhoid   ? History of blood transfusion   ? 2 units PRBC  ? Hx  of sinus tachycardia   ? rarely any problems since metoprolol use  ? Hyperlipidemia   ? patient unaware, has never taking medication  ? Hypertension   ? Hypothyroidism   ? Osteoporosis   ? osteopenia  ? Sleep apnea   ? uses c pap  ? Varicose vein of leg   ? ?Past Surgical History:  ?Procedure Laterality Date  ? BIOPSY BREAST    ? 2 biopsies & 2 aspirations  ? CESAREAN SECTION    ? CLOSED REDUCTION TOE FRACTURE    ? COLONOSCOPY  2015  ? COLONOSCOPY W/ POLYPECTOMY  multiple since 2007  ? adenomas, diverticulosis;last 2015  ? ESOPHAGOGASTRODUODENOSCOPY (EGD) WITH PROPOFOL N/A 05/29/2016  ? Procedure: ESOPHAGOGASTRODUODENOSCOPY (EGD) WITH PROPOFOL;  Surgeon: Gatha Mayer, MD;  Location: [facility];  Service: Endoscopy;  Laterality: N/A;  ? HAMMER TOE SURGERY     ? PARTIAL KNEE ARTHROPLASTY Right 05/22/2016  ? Procedure: RIGHT KNEE MEDIAL UNICOMPARTMENTAL ARTHROPLASTY;  Surgeon: Gaynelle Arabian, MD;  Location: [facility];  Service: Orthopedics;  Laterality: Right;  ? TONSILLECTOMY AND ADENOIDECTOMY    ? TOTAL KNEE ARTHROPLASTY Left 08/27/2017  ? Procedure: LEFT TOTAL KNEE ARTHROPLASTY;  Surgeon: Gaynelle Arabian, MD;  Location: [facility];  Service: Orthopedics;  Laterality: Left;  ? WISDOM TOOTH EXTRACTION    ? ?Social History  ? ?Social History Narrative  ? 3 biological children (Set of Identical twins)   ? 3 Step children   ? ?family history includes Alcohol abuse in her father; Bipolar disorder in her daughter; Cancer in her maternal grandmother; Deep vein thrombosis in her paternal aunt; Depression in her daughter; Diabetes in her maternal aunt; Heart attack in her maternal grandfather; Hypertension in her father; Liver disease in her father; Melanoma in her brother; Multiple sclerosis (age of onset: 2) in her daughter; Osteoporosis in her mother; Other in her paternal aunt; Rheum arthritis in her mother; Stroke in her maternal aunt; Thyroid disease in her mother. ? ? ?Review of Systems ? ? ?Objective:  ? Physical Exam ? ? ?

## 2022-03-03 ENCOUNTER — Ambulatory Visit (AMBULATORY_SURGERY_CENTER): Payer: Medicare PPO | Admitting: Internal Medicine

## 2022-03-03 ENCOUNTER — Encounter: Payer: Self-pay | Admitting: Internal Medicine

## 2022-03-03 VITALS — BP 118/74 | HR 57 | Temp 97.1°F | Resp 20 | Ht 63.0 in | Wt 218.0 lb

## 2022-03-03 DIAGNOSIS — K2970 Gastritis, unspecified, without bleeding: Secondary | ICD-10-CM | POA: Diagnosis not present

## 2022-03-03 DIAGNOSIS — K317 Polyp of stomach and duodenum: Secondary | ICD-10-CM | POA: Diagnosis not present

## 2022-03-03 DIAGNOSIS — K449 Diaphragmatic hernia without obstruction or gangrene: Secondary | ICD-10-CM | POA: Diagnosis not present

## 2022-03-03 DIAGNOSIS — D509 Iron deficiency anemia, unspecified: Secondary | ICD-10-CM

## 2022-03-03 DIAGNOSIS — K219 Gastro-esophageal reflux disease without esophagitis: Secondary | ICD-10-CM | POA: Diagnosis not present

## 2022-03-03 DIAGNOSIS — K2950 Unspecified chronic gastritis without bleeding: Secondary | ICD-10-CM | POA: Diagnosis not present

## 2022-03-03 MED ORDER — SODIUM CHLORIDE 0.9 % IV SOLN: 500.0000 mL | Freq: Once | INTRAVENOUS | Status: DC

## 2022-03-03 NOTE — Op Note (Signed)
[place] ?Patient Name: Kristine Olson ?Procedure Date: 03/03/2022 10:19 AM ?MRN: [id number] ?Endoscopist: Gatha Mayer , MD ?Age: 76 ?Referring MD:  ?Date of Birth: 02-20-1946 ?Gender: Female ?Account #: [id number] ?Procedure:                Upper GI endoscopy ?Indications:              Iron deficiency anemia, Heartburn, Suspected  ?                          gastro-esophageal reflux disease ?Medicines:                Monitored Anesthesia Care ?Procedure:                Pre-Anesthesia Assessment: ?                          - Prior to the procedure, a History and Physical  ?                          was performed, and patient medications and  ?                          allergies were reviewed. The patient's tolerance of  ?                          previous anesthesia was also reviewed. The risks  ?                          and benefits of the procedure and the sedation  ?                          options and risks were discussed with the patient.  ?                          All questions were answered, and informed consent  ?                          was obtained. Prior Anticoagulants: The patient has  ?                          taken no previous anticoagulant or antiplatelet  ?                          agents. ASA Grade Assessment: III - A patient with  ?                          severe systemic disease. After reviewing the risks  ?                          and benefits, the patient was deemed in  ?                          satisfactory condition to undergo the procedure. ?  After obtaining informed consent, the endoscope was  ?                          passed under direct vision. Throughout the  ?                          procedure, the patient's blood pressure, pulse, and  ?                          oxygen saturations were monitored continuously. The  ?                          GIF [id number] #[id number] was introduced through the  ?                          mouth, and advanced to  the second part of duodenum.  ?                          The upper GI endoscopy was accomplished without  ?                          difficulty. The patient tolerated the procedure  ?                          well. ?Scope In: ?Scope Out: ?Findings:                 LA Grade A (one or more mucosal breaks less than 5  ?                          mm, not extending between tops of 2 mucosal folds)  ?                          esophagitis was found in the distal esophagus. ?                          A 5 cm hiatal hernia was present. ?                          Patchy inflammation characterized by congestion  ?                          (edema), erosions, erythema and friability was  ?                          found in the gastric body. Biopsies were taken with  ?                          a cold forceps for histology. Verification of  ?                          patient identification for the specimen was done.  ?  Estimated blood loss was minimal. ?                          Multiple diminutive sessile polyps were found in  ?                          the gastric fundus and in the gastric body.  ?                          Biopsies were taken with a cold forceps for  ?                          histology. Verification of patient identification  ?                          for the specimen was done. Estimated blood loss was  ?                          minimal. ?                          The exam was otherwise without abnormality. ?                          The gastroesophageal flap valve was visualized  ?                          endoscopically and classified as Hill Grade IV (no  ?                          fold, wide open lumen, hiatal hernia present). ?                          Biopsies for histology were taken with a cold  ?                          forceps in the entire duodenum for evaluation of  ?                          celiac disease. Verification of patient  ?                          identification  for the specimen was done. Estimated  ?                          blood loss was minimal. ?Complications:            No immediate complications. ?Estimated Blood Loss:     Estimated blood loss was minimal. ?Impression:               - LA Grade A reflux esophagitis. ?                          - 5 cm hiatal hernia. No signs of cameron erosions ?                          -  Gastritis. Biopsied. ?                          - Multiple gastric polyps. Biopsied. Look like  ?                          benign and innocent fundic gland polyps. no signs  ?                          of bleeding. ?                          - The examination was otherwise normal. ?                          - Gastroesophageal flap valve classified as Hill  ?                          Grade IV (no fold, wide open lumen, hiatal hernia  ?                          present). ?                          - Biopsies were taken with a cold forceps for  ?                          evaluation of celiac disease. ?Recommendation:           - Patient has a contact number available for  ?                          emergencies. The signs and symptoms of potential  ?                          delayed complications were discussed with the  ?                          patient. Return to normal activities tomorrow.  ?                          Written discharge instructions were provided to the  ?                          patient. ?                          - Continue present medications. Take pantoprazole  ?                          every day instead of every other day ?                          - Await pathology results. ?                          - reflux diet  and consider low carb and time  ?                          restricted eating as discussed at office visit ?Gatha Mayer, MD ?03/03/2022 10:51:01 AM ?This report has been signed electronically. ?

## 2022-03-03 NOTE — Patient Instructions (Addendum)
I did not see anything bad going on. ? ?You do have reflux esophagitis - inflamed esophagus due to reflux. Please take your pantoprazole every day not every other day. ? ?There was stomach inflammation and some tiny polyps that look innocent. Biopsies taken. ? ?I also took intestinal biopsies to check for a rare problem where iron absorption might be impaired (gluten allergy). ? ?I will let you know the results and recommendations. ? ?I appreciate the opportunity to care for you. ?Gatha Mayer, MD, [facility] ? ?YOU HAD AN ENDOSCOPIC PROCEDURE TODAY AT [facility]:   Refer to the procedure report that was given to you for any specific questions about what was found during the examination.  If the procedure report does not answer your questions, please call your gastroenterologist to clarify.  If you requested that your care partner not be given the details of your procedure findings, then the procedure report has been included in a sealed envelope for you to review at your convenience later. ? ?YOU SHOULD EXPECT: Some feelings of bloating in the abdomen. Passage of more gas than usual.  Walking can help get rid of the air that was put into your GI tract during the procedure and reduce the bloating. If you had a lower endoscopy (such as a colonoscopy or flexible sigmoidoscopy) you may notice spotting of blood in your stool or on the toilet paper. If you underwent a bowel prep for your procedure, you may not have a normal bowel movement for a few days. ? ?Please Note:  You might notice some irritation and congestion in your nose or some drainage.  This is from the oxygen used during your procedure.  There is no need for concern and it should clear up in a day or so. ? ?SYMPTOMS TO REPORT IMMEDIATELY: ? ?Following upper endoscopy (EGD) ? Vomiting of blood or coffee ground material ? New chest pain or pain under the shoulder blades ? Painful or persistently difficult swallowing ? New shortness of  breath ? Fever of 100?F or higher ? Black, tarry-looking stools ? ?For urgent or emergent issues, a gastroenterologist can be reached at any hour by calling [phone]. ?Do not use MyChart messaging for urgent concerns.  ? ? ?DIET:  We do recommend a small meal at first, but then you may proceed to your regular diet.  Drink plenty of fluids but you should avoid alcoholic beverages for 24 hours. ? ?ACTIVITY:  You should plan to take it easy for the rest of today and you should NOT DRIVE or use heavy machinery until tomorrow (because of the sedation medicines used during the test).   ? ?FOLLOW UP: ?Our staff will call the number listed on your records 48-72 hours following your procedure to check on you and address any questions or concerns that you may have regarding the information given to you following your procedure. If we do not reach you, we will leave a message.  We will attempt to reach you two times.  During this call, we will ask if you have developed any symptoms of COVID 19. If you develop any symptoms (ie: fever, flu-like symptoms, shortness of breath, cough etc.) before then, please call [phone].  If you test positive for Covid 19 in the 2 weeks post procedure, please call and report this information to [place].   ? ?If any biopsies were taken you will be contacted by phone or by letter within the next 1-3 weeks.  Please call us  at [phone] if you have not heard about the biopsies in 3 weeks.  ? ? ?SIGNATURES/CONFIDENTIALITY: ?You and/or your care partner have signed paperwork which will be entered into your electronic medical record.  These signatures attest to the fact that that the information above on your After Visit Summary has been reviewed and is understood.  Full responsibility of the confidentiality of this discharge information lies with you and/or your care-partner.  ?

## 2022-03-03 NOTE — Progress Notes (Signed)
Called to room to assist during endoscopic procedure.  Patient ID and intended procedure confirmed with present staff. Received instructions for my participation in the procedure from the performing physician.  

## 2022-03-03 NOTE — Progress Notes (Signed)
History and Physical Interval Note: ? ?03/03/2022 ?10:22 AM ? ?[name]  has presented today for endoscopic procedure(s), with the diagnosis of  ?Encounter Diagnosis  ?Name Primary?  ? Iron deficiency anemia, unspecified iron deficiency anemia type Yes  ?Marland Kitchen  The various methods of evaluation and treatment have been discussed with the patient and/or family. After consideration of risks, benefits and other options for treatment, the patient has consented to  the endoscopic procedure(s). ? ? The patient's history has been reviewed, patient examined, no change in status, stable for endoscopic procedure(s).  I have reviewed the patient's chart and labs.  Questions were answered to the patient's satisfaction.   ? ? ?Gatha Mayer, MD, [facility] ? ?

## 2022-03-03 NOTE — Progress Notes (Signed)
Sedate, gd SR, tolerated procedure well, VSS, report to RN 

## 2022-03-07 ENCOUNTER — Telehealth: Payer: Self-pay | Admitting: *Deleted

## 2022-03-07 ENCOUNTER — Telehealth: Payer: Self-pay

## 2022-03-07 NOTE — Telephone Encounter (Signed)
?  Follow up Call- ? ? ?  03/03/2022  ?  9:42 AM 03/04/2020  ? 12:55 PM  ?Call back number  ?Post procedure Call Back phone  # [phone]  ?Permission to leave phone message Yes Yes  ?  ? ?Patient questions: ? ?Do you have a fever, pain , or abdominal swelling? No. ?Pain Score  0 * ? ?Have you tolerated food without any problems? Yes.   ? ?Have you been able to return to your normal activities? Yes.   ? ?Do you have any questions about your discharge instructions: ?Diet   No. ?Medications  No. ?Follow up visit  No. ? ?Do you have questions or concerns about your Care? No. ? ?Actions: ?* If pain score is 4 or above: ?No action needed, pain <4. ? ? ?

## 2022-03-07 NOTE — Telephone Encounter (Signed)
Attempted f/u call. No answer, left VM. 

## 2022-04-14 ENCOUNTER — Other Ambulatory Visit: Payer: Self-pay | Admitting: Family Medicine

## 2022-04-17 DIAGNOSIS — F411 Generalized anxiety disorder: Secondary | ICD-10-CM | POA: Diagnosis not present

## 2022-04-20 DIAGNOSIS — H90A31 Mixed conductive and sensorineural hearing loss, unilateral, right ear with restricted hearing on the contralateral side: Secondary | ICD-10-CM | POA: Diagnosis not present

## 2022-04-20 DIAGNOSIS — H6591 Unspecified nonsuppurative otitis media, right ear: Secondary | ICD-10-CM | POA: Diagnosis not present

## 2022-04-20 DIAGNOSIS — H90A22 Sensorineural hearing loss, unilateral, left ear, with restricted hearing on the contralateral side: Secondary | ICD-10-CM | POA: Diagnosis not present

## 2022-05-08 DIAGNOSIS — H6591 Unspecified nonsuppurative otitis media, right ear: Secondary | ICD-10-CM | POA: Diagnosis not present

## 2022-05-08 DIAGNOSIS — H90A31 Mixed conductive and sensorineural hearing loss, unilateral, right ear with restricted hearing on the contralateral side: Secondary | ICD-10-CM | POA: Diagnosis not present

## 2022-05-19 ENCOUNTER — Other Ambulatory Visit: Payer: Self-pay | Admitting: Family Medicine

## 2022-06-09 ENCOUNTER — Other Ambulatory Visit: Payer: Self-pay | Admitting: Family Medicine

## 2022-06-09 ENCOUNTER — Other Ambulatory Visit: Payer: Self-pay | Admitting: Lab

## 2022-06-12 DIAGNOSIS — H903 Sensorineural hearing loss, bilateral: Secondary | ICD-10-CM | POA: Diagnosis not present

## 2022-06-12 DIAGNOSIS — Z9622 Myringotomy tube(s) status: Secondary | ICD-10-CM | POA: Diagnosis not present

## 2022-06-12 DIAGNOSIS — H6981 Other specified disorders of Eustachian tube, right ear: Secondary | ICD-10-CM | POA: Diagnosis not present

## 2022-07-10 ENCOUNTER — Telehealth: Payer: Self-pay | Admitting: Family Medicine

## 2022-07-10 NOTE — Telephone Encounter (Signed)
Caller name: Chad   On DPR? :yes/no: Yes  Call back number: [phone]  Provider they see:  Birdie Riddle   Reason for call: pt dropped off renewal of disability parking placard form for Dr.Tabori to fill out. Form will be in the front bin.

## 2022-07-10 NOTE — Telephone Encounter (Signed)
Will place in Dr Birdie Riddle to be signed folder . Once signed I will call pt

## 2022-07-12 DIAGNOSIS — F411 Generalized anxiety disorder: Secondary | ICD-10-CM | POA: Diagnosis not present

## 2022-07-14 ENCOUNTER — Telehealth: Payer: Self-pay

## 2022-07-14 NOTE — Telephone Encounter (Signed)
I have left the pt a VM stating her handicap parking form is ready for pick up. Copy made and placed in scan folder . Original is in file cabinet up front .

## 2022-08-07 ENCOUNTER — Encounter: Payer: Self-pay | Admitting: Family Medicine

## 2022-08-07 ENCOUNTER — Ambulatory Visit: Payer: Medicare PPO | Admitting: Family Medicine

## 2022-08-07 VITALS — BP 130/82 | HR 70 | Temp 98.9°F | Resp 17 | Ht 63.0 in | Wt 221.2 lb

## 2022-08-07 DIAGNOSIS — I10 Essential (primary) hypertension: Secondary | ICD-10-CM | POA: Diagnosis not present

## 2022-08-07 DIAGNOSIS — E038 Other specified hypothyroidism: Secondary | ICD-10-CM | POA: Diagnosis not present

## 2022-08-07 LAB — BASIC METABOLIC PANEL
BUN: 20 mg/dL (ref 6–23)
CO2: 28 mEq/L (ref 19–32)
Calcium: 9.6 mg/dL (ref 8.4–10.5)
Chloride: 103 mEq/L (ref 96–112)
Creatinine, Ser: 1.06 mg/dL (ref 0.40–1.20)
GFR: 51.88 mL/min — ABNORMAL LOW (ref >60.00)
Glucose, Bld: 102 mg/dL — ABNORMAL HIGH (ref 70–99)
Potassium: 4.7 mEq/L (ref 3.5–5.1)
Sodium: 140 mEq/L (ref 135–145)

## 2022-08-07 LAB — CBC WITH DIFFERENTIAL/PLATELET
Basophils Absolute: 0.1 10*3/uL (ref 0.0–0.1)
Basophils Relative: 0.9 % (ref 0.0–3.0)
Eosinophils Absolute: 0.3 10*3/uL (ref 0.0–0.7)
Eosinophils Relative: 3.5 % (ref 0.0–5.0)
HCT: 38.1 % (ref 36.0–46.0)
Hemoglobin: 12.4 g/dL (ref 12.0–15.0)
Lymphocytes Relative: 15.2 % (ref 12.0–46.0)
Lymphs Abs: 1.1 10*3/uL (ref 0.7–4.0)
MCHC: 32.4 g/dL (ref 30.0–36.0)
MCV: 84.9 fl (ref 78.0–100.0)
Monocytes Absolute: 0.7 10*3/uL (ref 0.1–1.0)
Monocytes Relative: 9.1 % (ref 3.0–12.0)
Neutro Abs: 5.3 10*3/uL (ref 1.4–7.7)
Neutrophils Relative %: 71.3 % (ref 43.0–77.0)
Platelets: 223 10*3/uL (ref 150.0–400.0)
RBC: 4.49 Mil/uL (ref 3.87–5.11)
RDW: 14.8 % (ref 11.5–15.5)
WBC: 7.5 10*3/uL (ref 4.0–10.5)

## 2022-08-07 LAB — LIPID PANEL
Cholesterol: 170 mg/dL (ref 0–200)
HDL: 58.7 mg/dL (ref >39.00)
LDL Cholesterol: 95 mg/dL (ref 0–99)
NonHDL: 111.67
Total CHOL/HDL Ratio: 3
Triglycerides: 85 mg/dL (ref 0.0–149.0)
VLDL: 17 mg/dL (ref 0.0–40.0)

## 2022-08-07 LAB — HEPATIC FUNCTION PANEL
ALT: 10 U/L (ref 0–35)
AST: 15 U/L (ref 0–37)
Albumin: 4.1 g/dL (ref 3.5–5.2)
Alkaline Phosphatase: 70 U/L (ref 39–117)
Bilirubin, Direct: 0 mg/dL (ref 0.0–0.3)
Total Bilirubin: 0.2 mg/dL (ref 0.2–1.2)
Total Protein: 7.5 g/dL (ref 6.0–8.3)

## 2022-08-07 LAB — TSH: TSH: 0.46 u[IU]/mL (ref 0.35–5.50)

## 2022-08-07 NOTE — Assessment & Plan Note (Signed)
Chronic problem.  On Levothyroxine 140mg daily and is asymptomatic.  Check labs.  Adjust meds prn

## 2022-08-07 NOTE — Progress Notes (Signed)
   Subjective:    Patient ID: Kristine Olson, female    DOB: October 16, 1946, 76 y.o.   MRN: [id number]  HPI HTN- chronic problem, on Metoprolol '25mg'$  BID and Spironolactone '25mg'$  daily w/ adequate control.  No CP, SOB above baseline, HAs, visual changes, edema.  Hypothyroid- chronic problem, on Levothyroxine 141mg daily.  Energy level is unchanged.  No changes to skin/hair/nails.  Obesity- pt has gained 3 lbs since May and BMI now 39.19.  'i just cant get a handle on it'.  No regular exercise.  Pt admits to emotional eating   Review of Systems For ROS see HPI     Objective:   Physical Exam Vitals reviewed.  Constitutional:      General: She is not in acute distress.    Appearance: Normal appearance. She is well-developed. She is obese. She is not ill-appearing.  HENT:     Head: Normocephalic and atraumatic.  Eyes:     Conjunctiva/sclera: Conjunctivae normal.     Pupils: Pupils are equal, round, and reactive to light.  Neck:     Thyroid: No thyromegaly.  Cardiovascular:     Rate and Rhythm: Normal rate and regular rhythm.     Heart sounds: Normal heart sounds. No murmur heard. Pulmonary:     Effort: Pulmonary effort is normal. No respiratory distress.     Breath sounds: Normal breath sounds.  Abdominal:     General: There is no distension.     Palpations: Abdomen is soft.     Tenderness: There is no abdominal tenderness.  Musculoskeletal:     Cervical back: Normal range of motion and neck supple.     Right lower leg: No edema.     Left lower leg: No edema.  Lymphadenopathy:     Cervical: No cervical adenopathy.  Skin:    General: Skin is warm and dry.  Neurological:     General: No focal deficit present.     Mental Status: She is alert and oriented to person, place, and time.  Psychiatric:        Mood and Affect: Mood normal.        Behavior: Behavior normal.           Assessment & Plan:

## 2022-08-07 NOTE — Assessment & Plan Note (Signed)
Chronic problem.  On Metoprolol '25mg'$  BID and spironolactone '25mg'$  daily w/ adequate control.  Currently asymptomatic.  Check labs due to diuretic but no anticipated med changes.

## 2022-08-07 NOTE — Progress Notes (Signed)
Informed pt of lab results  

## 2022-08-07 NOTE — Patient Instructions (Addendum)
Schedule your complete physical in 6 months We'll notify you of your lab results and make any changes if needed Continue to work on low carb diet and try and get regular physical activity Call [facility] at [phone] and see where you are on the wait list Call with any questions or concerns Stay Safe!  Stay Healthy! [place]!!!

## 2022-08-07 NOTE — Assessment & Plan Note (Signed)
Ongoing issue for pt.  She was referred to [facility] last November but has not heard from them regarding scheduling.  She was told the wait list was ~10 months but it has been longer than that.  Encouraged her to call and see if she can schedule.  Check labs to risk stratify.  Will follow.

## 2022-08-15 DIAGNOSIS — H903 Sensorineural hearing loss, bilateral: Secondary | ICD-10-CM | POA: Diagnosis not present

## 2022-09-06 DIAGNOSIS — F411 Generalized anxiety disorder: Secondary | ICD-10-CM | POA: Diagnosis not present

## 2022-09-11 ENCOUNTER — Encounter (INDEPENDENT_AMBULATORY_CARE_PROVIDER_SITE_OTHER): Payer: Self-pay | Admitting: Family Medicine

## 2022-09-11 ENCOUNTER — Ambulatory Visit (INDEPENDENT_AMBULATORY_CARE_PROVIDER_SITE_OTHER): Payer: Medicare PPO | Admitting: Family Medicine

## 2022-09-11 VITALS — BP 143/84 | HR 83 | Temp 97.6°F | Ht 63.0 in | Wt 219.0 lb

## 2022-09-11 DIAGNOSIS — E669 Obesity, unspecified: Secondary | ICD-10-CM | POA: Diagnosis not present

## 2022-09-11 DIAGNOSIS — Z0289 Encounter for other administrative examinations: Secondary | ICD-10-CM

## 2022-09-11 DIAGNOSIS — Z6838 Body mass index (BMI) 38.0-38.9, adult: Secondary | ICD-10-CM | POA: Diagnosis not present

## 2022-09-11 DIAGNOSIS — G4733 Obstructive sleep apnea (adult) (pediatric): Secondary | ICD-10-CM

## 2022-09-11 DIAGNOSIS — I10 Essential (primary) hypertension: Secondary | ICD-10-CM

## 2022-09-21 NOTE — Progress Notes (Signed)
Office: [phone]  /  Fax: [phone]   Initial Visit  Kristine Olson was seen in clinic today to evaluate for obesity. She is interested in losing weight to improve overall health and reduce the risk of weight related complications. She presents today to review program treatment options, initial physical assessment, and evaluation.     She was referred by: PCP  When asked what else they would like to accomplish? She states: Adopt healthier eating patterns, Improve existing medical conditions, and Improve appearance  When asked how has your weight affected you? She states: Has affected self-esteem, Contributed to medical problems, and Problems with eating patterns  Some associated conditions: Hypertension, Arthritis, and OSA  Contributing factors: Reduced physical activity  Current nutrition plan: Other: Tried weight watchers, Noom and [employer],  Current level of physical activity: Step counting  Current or previous pharmacotherapy: None  Response to medication: Never tried medications   Past medical history includes:   Past Medical History:  Diagnosis Date   Acute pyloric channel ulcer with hemorrhage on Xarelto    Anemia    history of anemia after bleeding ulcer   Arthritis    ostearthritis - right  medial knee   Cataract    beginning cataracts   Complication of anesthesia    was given a medication while waiting to go to surgery that caused burning sensation on scalp was given another medication and the burning went away   COVID-19 2022   Depression    GERD (gastroesophageal reflux disease)    "heartburn daily"-TUMS helpful   Hemorrhoid    History of blood transfusion    2 units PRBC   Hx of sinus tachycardia    rarely any problems since metoprolol use   Hyperlipidemia    patient unaware, has never taking medication   Hypertension    Hypothyroidism    Osteoporosis    osteopenia   Sleep apnea    uses c pap   Varicose vein of leg    Objective:    BP (!) 143/84   Pulse 83   Temp 97.6 F (36.4 C)   Ht '5\' 3"'$  (1.6 m)   Wt 219 lb (99.3 kg)   SpO2 96%   BMI 38.79 kg/m  She was weighed on the bioimpedance scale: Body mass index is 38.79 kg/m.  Peak [id number] ,Visceral Fat Rating:17, Body Fat%:48.1  General:  Alert, oriented and cooperative. Patient is in no acute distress.  Respiratory: Normal respiratory effort, no problems with respiration noted  Extremities: Normal range of motion.    Mental Status: Normal mood and affect. Normal behavior. Normal judgment and thought content.   Assessment and Plan:  1. Essential hypertension Blood pressure mildly elevated today.  She is taking metoprolol 12.5 mg BID, Spironolactone 25 mg daily.   Continue blood pressure medications per PCP.  Monitor resting blood pressures at home.   2. OSA on CPAP She wears her CPAP nightly.   Look for  improvements in sleep apnea with weight reduction.  Aim for 8 hours of sleep with CPAP.   3. Obesity,current BMI 38.9 1) Reviewed obesity as a disease. 2) Reviewed information about our program. 3) Reviewed today's Bioimpedance results.   We reviewed weight, biometrics, associated medical conditions and contributing factors with patient. She would benefit from weight loss therapy via a modified calorie, low-carb, high-protein nutritional plan tailored to their REE (resting energy expenditure) which will be determined by indirect calorimetry.  We will also assess for cardiometabolic risk and nutritional  derangements via fasting serologies at her next appointment.    Obesity Treatment / Action Plan:  Patient will work on garnering support from family and friends to begin weight loss journey. Will complete provided nutritional and psychosocial assessment questionnaire before the next appointment. Will be scheduled for indirect calorimetry to determine resting energy expenditure in a fasting state.  This will allow [place] to create a reduced calorie,  high-protein meal plan to promote loss of fat mass while preserving muscle mass. Was counseled on nutritional approaches to weight loss and benefits of complex carbs and high quality protein as part of nutritional weight management.  Obesity Education Performed Today:  She was weighed on the bioimpedance scale and results were discussed and documented in the synopsis.  We discussed obesity as a disease and the importance of a more detailed evaluation of all the factors contributing to the disease.  We discussed the importance of long term lifestyle changes which include nutrition, exercise and behavioral modifications as well as the importance of customizing this to her specific health and social needs.  We discussed the benefits of reaching a healthier weight to alleviate the symptoms of existing conditions and reduce the risks of the biomechanical, metabolic and psychological effects of obesity.  Kristine Olson appears to be in the action stage of change and states they are ready to start intensive lifestyle modifications and behavioral modifications.  30 minutes was spent today on this visit including the above counseling, pre-visit chart review, and post-visit documentation.  Reviewed by clinician on day of visit: allergies, medications, problem list, medical history, surgical history, family history, social history, and previous encounter notes.  I, Davy Pique, am acting as Location manager for Loyal Gambler, DO.   I have reviewed the above documentation for accuracy and completeness, and I agree with the above. Dell Ponto, DO

## 2022-10-05 IMAGING — DX DG ABDOMEN 1V
2 series · 2 of 2 positions shown · non-contrast
Comparison: None

CLINICAL DATA: Lower back pain and some gas/bloating for 1 month,
no known injury

EXAM:
ABDOMEN - 1 VIEW

[abdomen kub (1 of 2)]
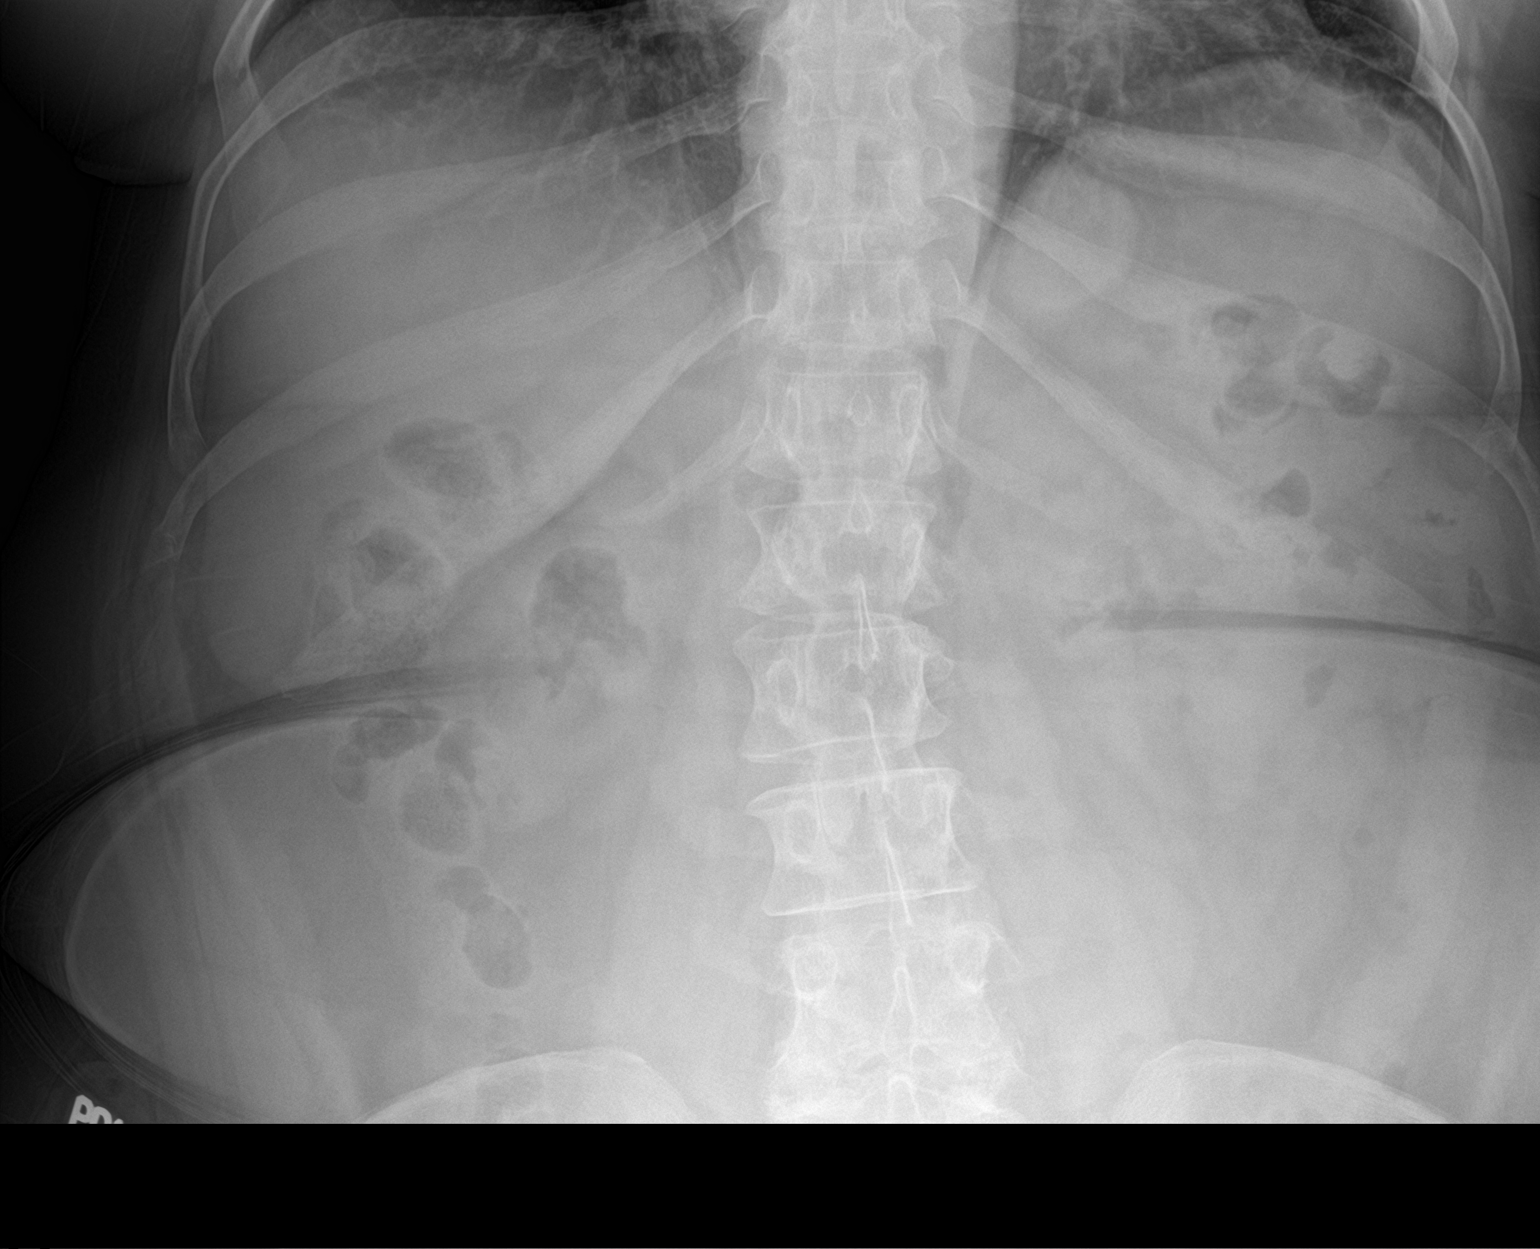

[abdomen kub (2 of 2)]
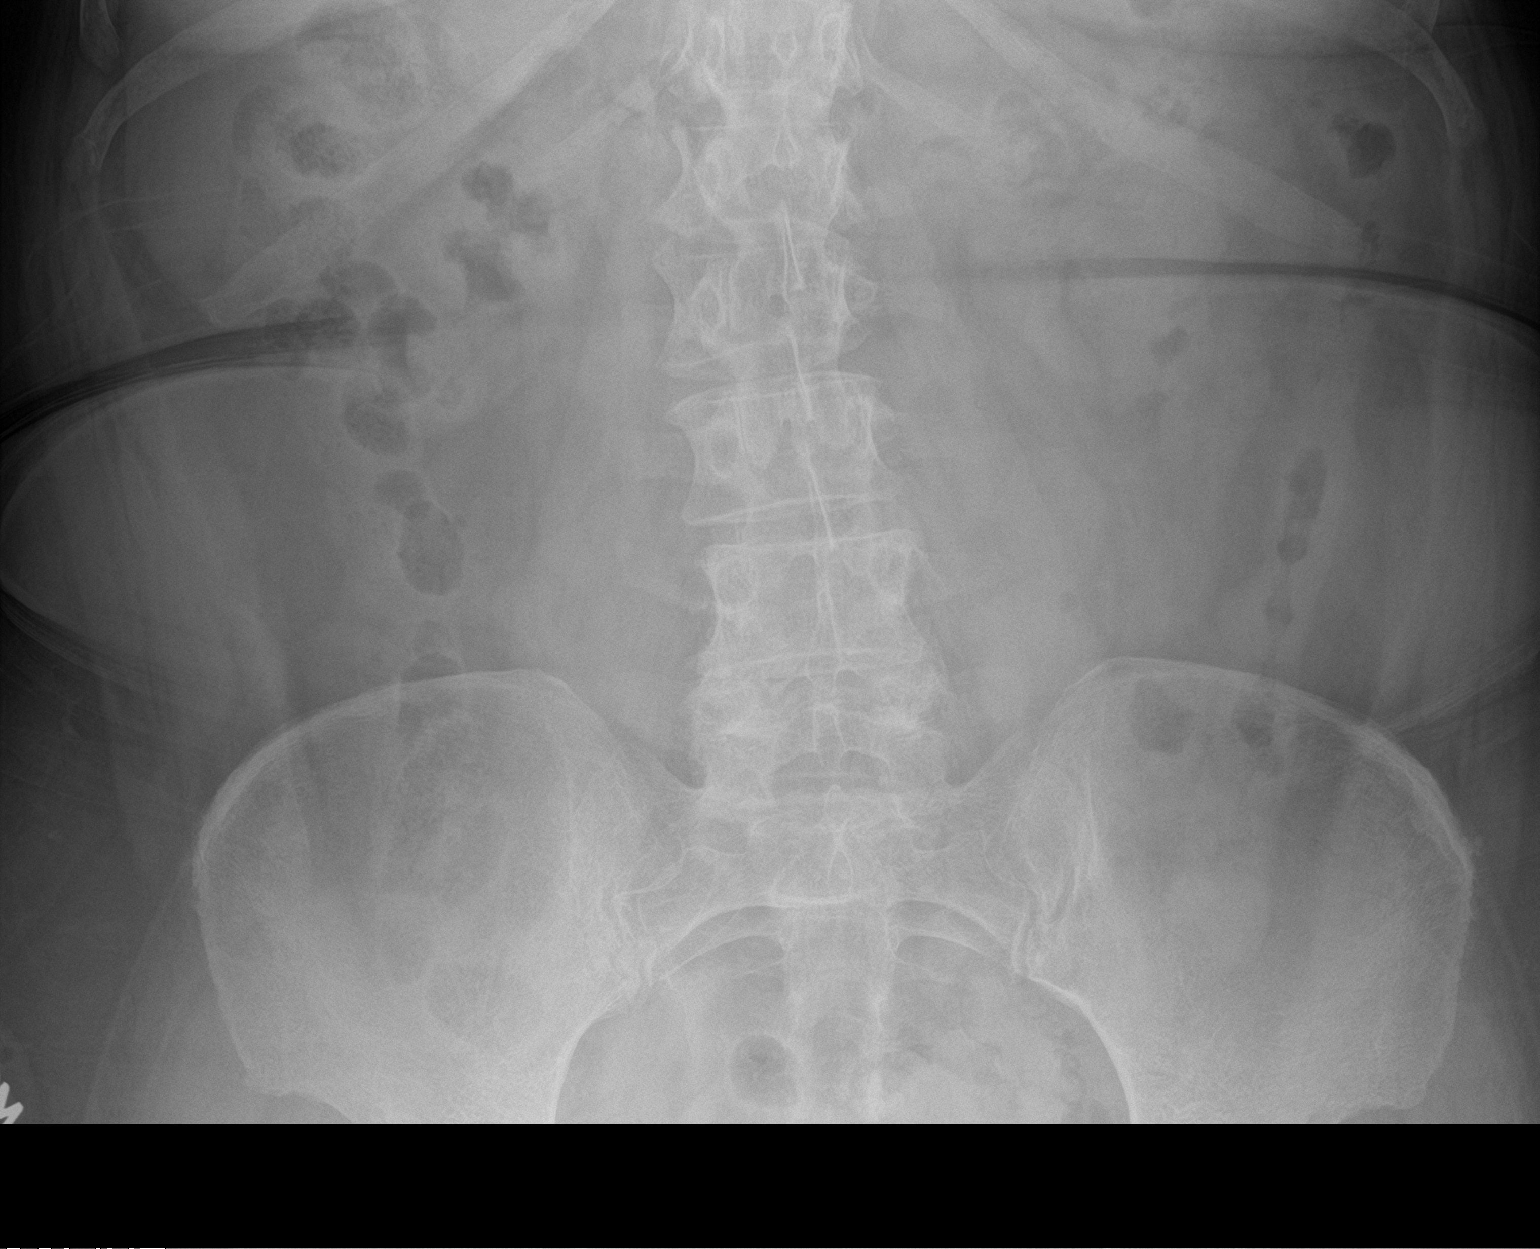

[2 of 2 positions shown; findings below may reference images not displayed]

FINDINGS: Normal bowel gas pattern without bowel dilatation or wall
thickening.

Normal retained stool burden.

Bones demineralized.

No urinary tract calcification.
IMPRESSION: Normal exam.

## 2022-10-05 IMAGING — US US PELVIS COMPLETE WITH TRANSVAGINAL
2 series · 13 of 25 positions shown · non-contrast
Comparison: None

CLINICAL DATA: Postmenopausal bleeding, spotting



[Series 1: us pelvis complete with transvaginal · 0.30mm/px · 4 of 17 slices shown (1 of 2)]
[im 1/17]
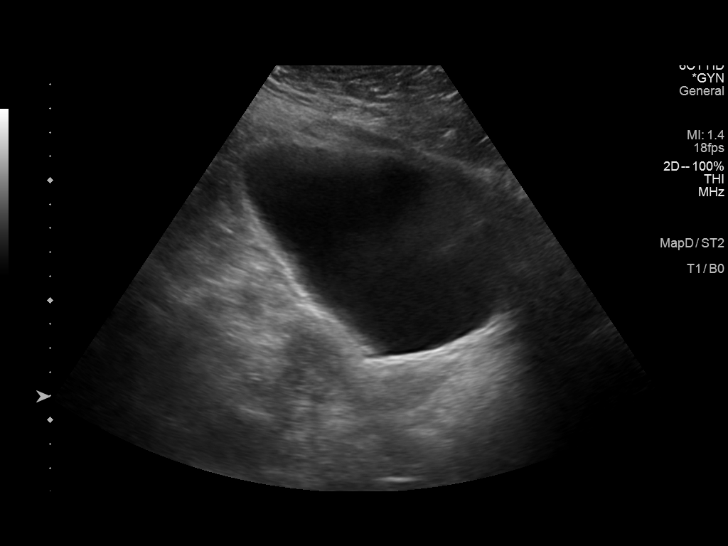
[im 5/17]
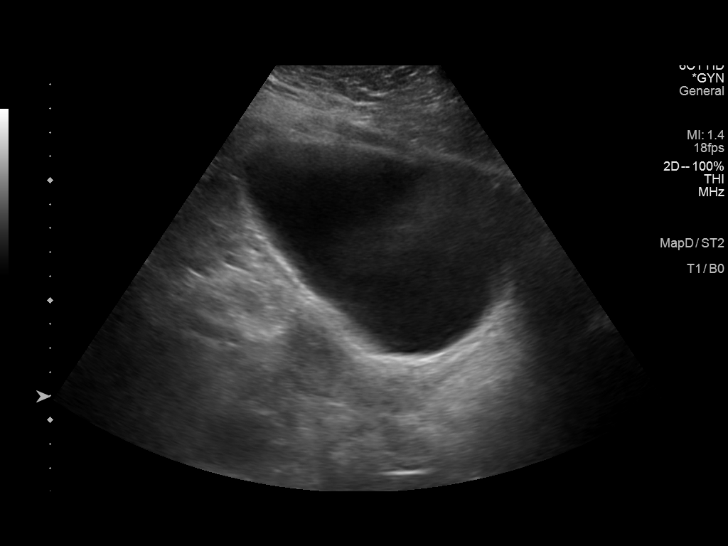
[im 10/17]
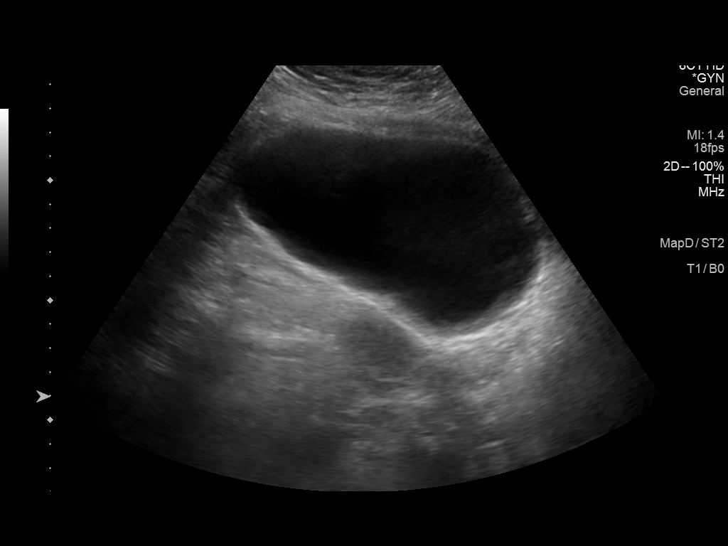
[im 14/17]
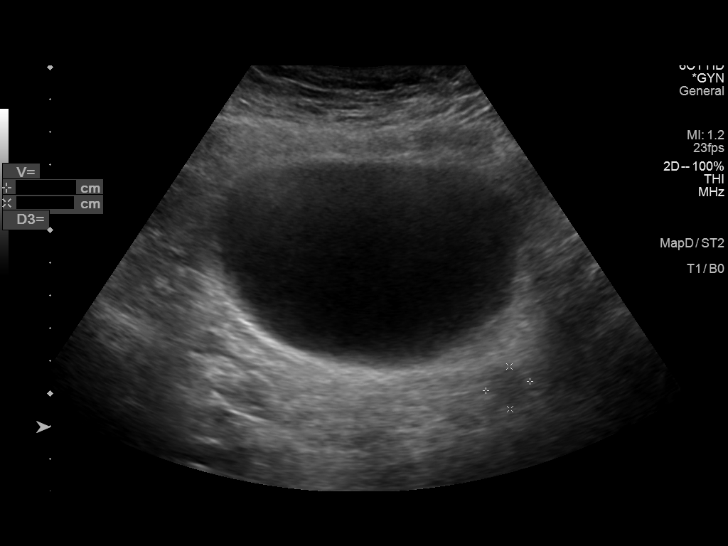

[Series 2: us pelvis complete with transvaginal · 0.11mm/px · 9 of 36 slices shown (2 of 2)]
[im 1/36]
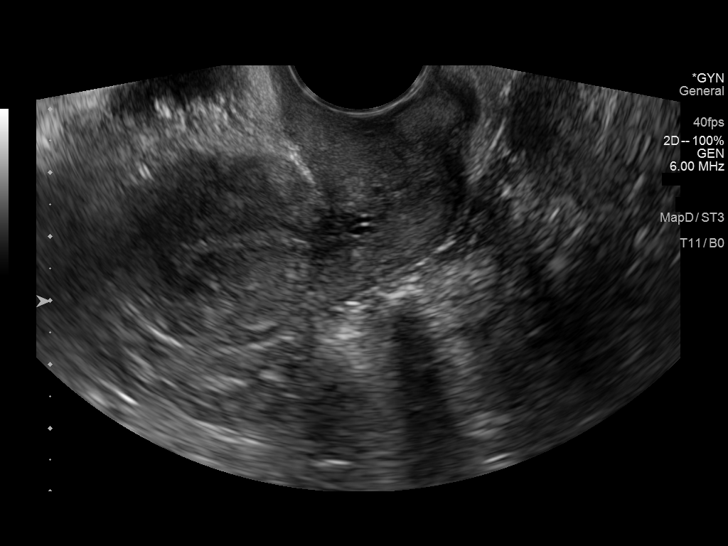
[im 5/36]
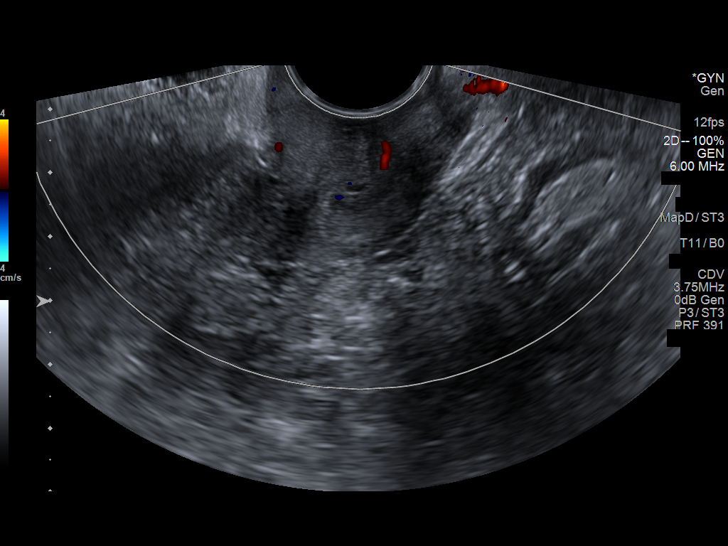
[im 9/36]
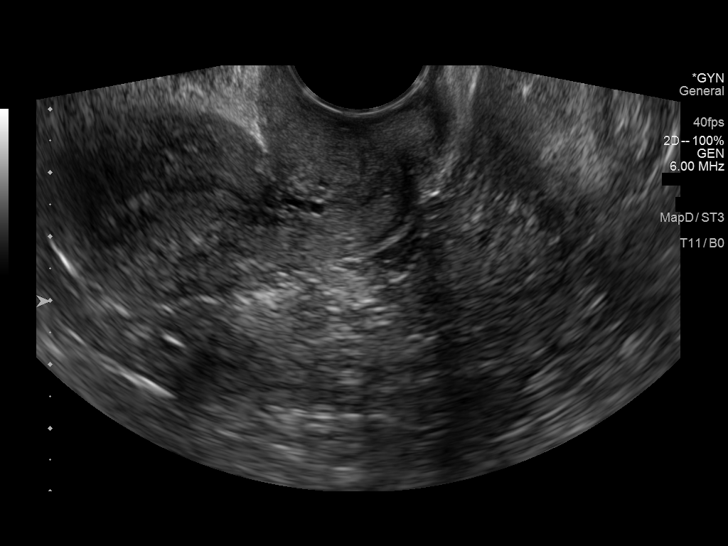
[im 14/36]
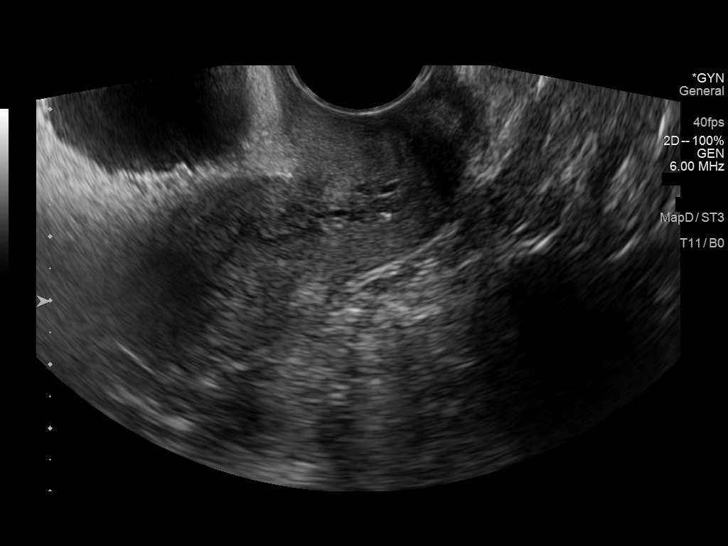
[im 18/36]
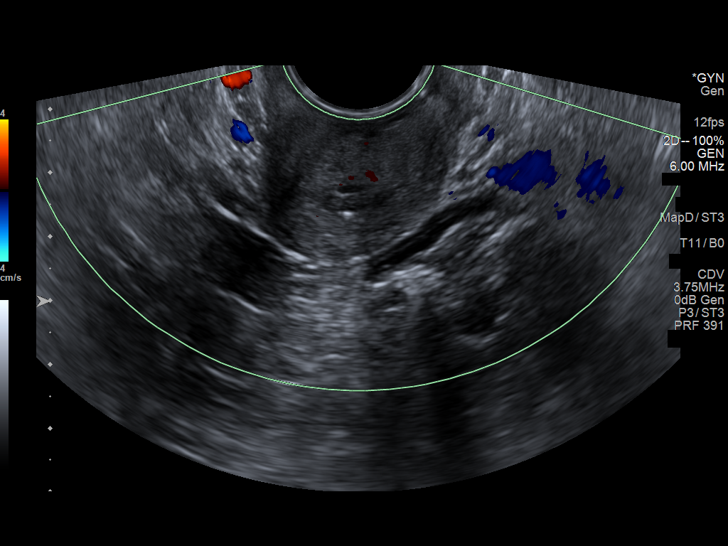
[im 22/36]
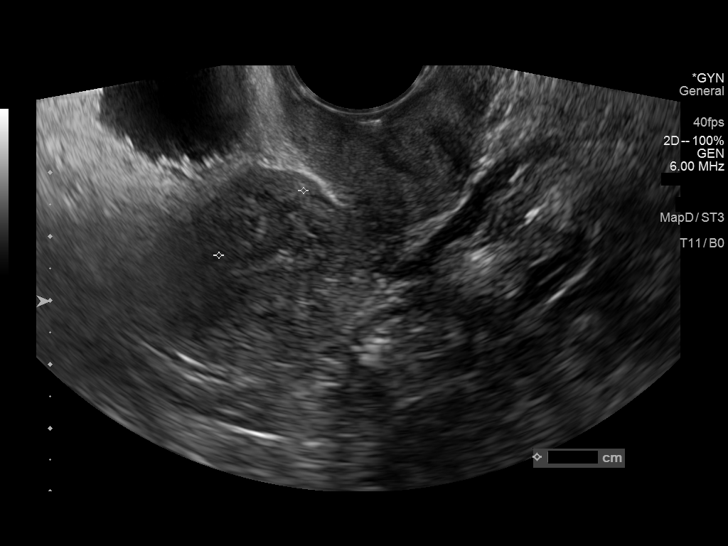
[im 27/36]
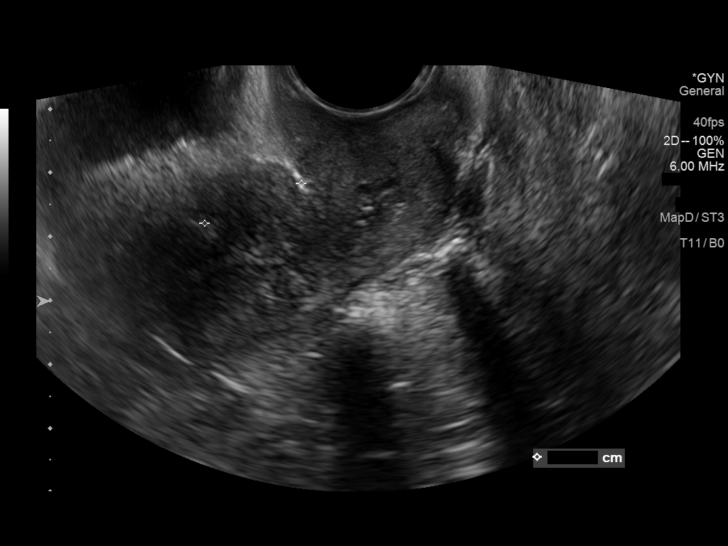
[im 31/36]
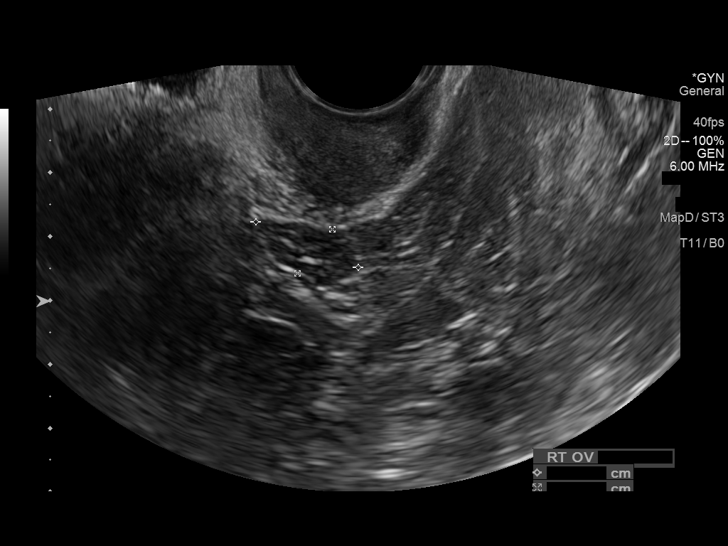
[im 36/36]
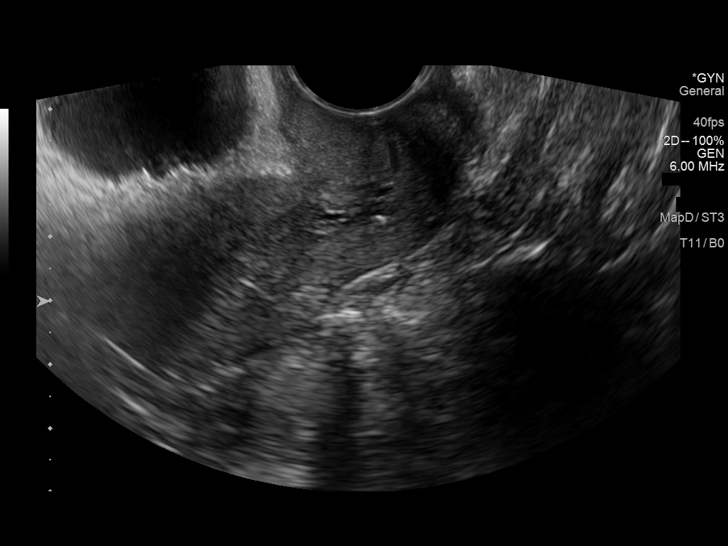

[13 of 25 positions shown; findings below may reference images not displayed]

FINDINGS: Uterus

Measurements: 6.9 x 2.9 x 4.6 cm = volume: 48 mL. Anteverted.
Anterior wall Caesarean section scar. Heterogeneous myometrium. No
definite focal mass.

Endometrium

Thickness: 3 mm.  No endometrial fluid or mass

Right ovary

Measurements: 1.8 x 0.9 x 1.3 cm = volume: 1 mL. Normal morphology
without mass

Left ovary

Measurements: 1.4 x 0.9 x 1.3 cm = volume: 0.9 mL. Normal morphology
without mass

Other findings

No free pelvic fluid.  No adnexal masses.
IMPRESSION: Anterior wall Caesarean section scar.

Otherwise negative exam.

## 2022-10-12 ENCOUNTER — Ambulatory Visit (INDEPENDENT_AMBULATORY_CARE_PROVIDER_SITE_OTHER): Payer: Medicare PPO | Admitting: Family Medicine

## 2022-10-12 ENCOUNTER — Other Ambulatory Visit: Payer: Self-pay | Admitting: Family Medicine

## 2022-10-12 ENCOUNTER — Encounter (INDEPENDENT_AMBULATORY_CARE_PROVIDER_SITE_OTHER): Payer: Self-pay | Admitting: Family Medicine

## 2022-10-12 VITALS — BP 126/76 | HR 70 | Temp 98.0°F | Ht 63.0 in | Wt 222.0 lb

## 2022-10-12 DIAGNOSIS — G4733 Obstructive sleep apnea (adult) (pediatric): Secondary | ICD-10-CM

## 2022-10-12 DIAGNOSIS — E669 Obesity, unspecified: Secondary | ICD-10-CM | POA: Diagnosis not present

## 2022-10-12 DIAGNOSIS — E559 Vitamin D deficiency, unspecified: Secondary | ICD-10-CM | POA: Diagnosis not present

## 2022-10-12 DIAGNOSIS — R5383 Other fatigue: Secondary | ICD-10-CM | POA: Diagnosis not present

## 2022-10-12 DIAGNOSIS — R0602 Shortness of breath: Secondary | ICD-10-CM

## 2022-10-12 DIAGNOSIS — Z1331 Encounter for screening for depression: Secondary | ICD-10-CM

## 2022-10-12 DIAGNOSIS — Z6839 Body mass index (BMI) 39.0-39.9, adult: Secondary | ICD-10-CM

## 2022-10-12 DIAGNOSIS — I10 Essential (primary) hypertension: Secondary | ICD-10-CM | POA: Diagnosis not present

## 2022-10-13 LAB — T4, FREE: Free T4: 1.3 ng/dL (ref 0.82–1.77)

## 2022-10-13 LAB — INSULIN, RANDOM: INSULIN: 17.9 u[IU]/mL (ref 2.6–24.9)

## 2022-10-13 LAB — VITAMIN D 25 HYDROXY (VIT D DEFICIENCY, FRACTURES): Vit D, 25-Hydroxy: 26.8 ng/mL — ABNORMAL LOW (ref 30.0–100.0)

## 2022-10-13 LAB — FOLATE: Folate: 8.8 ng/mL (ref >3.0)

## 2022-10-13 LAB — TSH: TSH: 0.936 u[IU]/mL (ref 0.450–4.500)

## 2022-10-13 LAB — HEMOGLOBIN A1C
Est. average glucose Bld gHb Est-mCnc: 120 mg/dL
Hgb A1c MFr Bld: 5.8 % — ABNORMAL HIGH (ref 4.8–5.6)

## 2022-10-13 LAB — VITAMIN B12: Vitamin B-12: 230 pg/mL — ABNORMAL LOW (ref 232–1245)

## 2022-10-19 NOTE — Progress Notes (Signed)
Chief Complaint:   OBESITY Kristine Olson (MR# [id number]) is a 76 y.o. female who presents for evaluation and treatment of obesity and related comorbidities. Current BMI is Body mass index is 39.33 kg/m. Kristine Olson has been struggling with her weight for many years and has been unsuccessful in either losing weight, maintaining weight loss, or reaching her healthy weight goal.  Kristine Olson is currently in the action stage of change and ready to dedicate time achieving and maintaining a healthier weight. Kristine Olson is interested in becoming our patient and working on intensive lifestyle modifications including (but not limited to) diet and exercise for weight loss.  Kristine Olson's habits were reviewed today and are as follows: Her family eats meals together, she thinks her family will eat healthier with her, she struggles with family and or coworkers weight loss sabotage, her desired weight loss is 67 lbs, she started gaining weight in the year 2000, her heaviest weight ever was 223 pounds, she snacks frequently in the evenings, she skips meals frequently, she is frequently drinking liquids with calories, she frequently makes poor food choices, she frequently eats larger portions than normal, and she struggles with emotional eating.  Depression Screen Kristine Olson's Food and Mood (modified PHQ-9) score was 11.  Subjective:   1. Other fatigue Kristine Olson admits to daytime somnolence and admits to waking up still tired. Patient has a history of symptoms of daytime fatigue, morning fatigue, and hypertension. Kristine Olson generally gets 6 or 7 hours of sleep per night, and states that she has nightime awakenings. Snoring is present. Apneic episodes are present. Epworth Sleepiness Score is 6.  EKG and IC reviewed.  2. SOBOE (shortness of breath on exertion) Kristine Olson notes increasing shortness of breath with exercising and seems to be worsening over time with weight gain. She notes getting out of breath sooner  with activity than she used to. This has not gotten worse recently. Kristine Olson denies shortness of breath at rest or orthopnea.  3. OSA on CPAP Patient wears CPAP, getting about 6 hours of sleep at night.  4. Vitamin D deficiency Patient is taking OTC vitamin D 1000 IU daily.  Last vitamin D level 31.7 on 02/08/2022.  History of osteopenia on DEXA scan 2017, reviewed.  5. Essential hypertension Blood pressure well-controlled.  Patient is on metoprolol 25 mg half tablet twice daily, spironolactone 25 mg daily.  Assessment/Plan:   1. Other fatigue Kristine Olson does feel that her weight is causing her energy to be lower than it should be. Fatigue may be related to obesity, depression or many other causes. Labs will be ordered, and in the meanwhile, Kristine Olson will focus on self care including making healthy food choices, increasing physical activity and focusing on stress reduction. Update labs today.  Labs reviewed from 07/28/2022.  - EKG 12-Lead - VITAMIN D 25 Hydroxy (Vit-D Deficiency, Fractures) - TSH - T4, free - Insulin, random - Hemoglobin A1c - Folate - Vitamin B12  2. SOBOE (shortness of breath on exertion) Kristine Olson does feel that she gets out of breath more easily that she used to when she exercises. Kristine Olson's shortness of breath appears to be obesity related and exercise induced. She has agreed to work on weight loss and gradually increase exercise to treat her exercise induced shortness of breath. Will continue to monitor closely.  3. OSA on CPAP Began active plan for weight reduction.  Increase sleep time to 8 hours per night.  4. Vitamin D deficiency Check vitamin D level today.  - VITAMIN D  25 Hydroxy (Vit-D Deficiency, Fractures)  5. Essential hypertension Continue current medications.  Look for blood pressure improvements with weight loss, healthy lifestyle changes.  6. Depression screen Kristine Olson had a positive depression screening. Depression is commonly associated  with obesity and often results in emotional eating behaviors. We will monitor this closely and work on CBT to help improve the non-hunger eating patterns. Referral to Psychology may be required if no improvement is seen as she continues in our clinic.  7. Obesity.current BMI 39.4 1.  Allow one 100-calorie snack after dinner. 2.  100-calorie snack list given.  Kristine Olson is currently in the action stage of change and her goal is to continue with weight loss efforts. I recommend Kristine Olson begin the structured treatment plan as follows:  She has agreed to the Category 1 Plan.  Exercise goals: Thinking about walking options.   Behavioral modification strategies: increasing lean protein intake, decreasing simple carbohydrates, increasing vegetables, increasing water intake, decreasing liquid calories, decreasing eating out, no skipping meals, meal planning and cooking strategies, keeping healthy foods in the home, and holiday eating strategies .  She was informed of the importance of frequent follow-up visits to maximize her success with intensive lifestyle modifications for her multiple health conditions. She was informed we would discuss her lab results at her next visit unless there is a critical issue that needs to be addressed sooner. Kristine Olson agreed to keep her next visit at the agreed upon time to discuss these results.  Objective:   Blood pressure 126/76, pulse 70, temperature 98 F (36.7 C), height '5\' 3"'$  (1.6 m), weight 222 lb (100.7 kg), SpO2 96 %. Body mass index is 39.33 kg/m.  EKG: Normal sinus rhythm, rate 64 BPM.  Indirect Calorimeter completed today shows a VO2 of 194 and a REE of 1339.  Her calculated basal metabolic rate is 2440 thus her basal metabolic rate is worse than expected.  General: Cooperative, alert, well developed, in no acute distress. HEENT: Conjunctivae and lids unremarkable. Cardiovascular: Regular rhythm.  Lungs: Normal work of breathing. Neurologic: No  focal deficits.   Lab Results  Component Value Date   CREATININE 1.06 08/07/2022   BUN 20 08/07/2022   NA 140 08/07/2022   K 4.7 08/07/2022   CL 103 08/07/2022   CO2 28 08/07/2022   Lab Results  Component Value Date   ALT 10 08/07/2022   AST 15 08/07/2022   ALKPHOS 70 08/07/2022   BILITOT 0.2 08/07/2022   Lab Results  Component Value Date   HGBA1C 5.8 (H) 10/12/2022   HGBA1C 5.9 11/25/2014   HGBA1C 5.6 08/23/2012   Lab Results  Component Value Date   INSULIN 17.9 10/12/2022   Lab Results  Component Value Date   TSH 0.936 10/12/2022   Lab Results  Component Value Date   CHOL 170 08/07/2022   HDL 58.70 08/07/2022   LDLCALC 95 08/07/2022   TRIG 85.0 08/07/2022   CHOLHDL 3 08/07/2022   Lab Results  Component Value Date   WBC 7.5 08/07/2022   HGB 12.4 08/07/2022   HCT 38.1 08/07/2022   MCV 84.9 08/07/2022   PLT 223.0 08/07/2022   Lab Results  Component Value Date   IRON 26 (L) 11/14/2021   TIBC 475 (H) 11/14/2021   FERRITIN 5 (L) 11/14/2021   Attestation Statements:   Reviewed by clinician on day of visit: allergies, medications, problem list, medical history, surgical history, family history, social history, and previous encounter notes.  I have personally spent 40 minutes total  time today in preparation, patient care, and documentation for this visit, including the following: review of clinical lab tests; review of medical tests/procedures/services.    I, Davy Pique, am acting as Location manager for Loyal Gambler, DO.  I have reviewed the above documentation for accuracy and completeness, and I agree with the above. Dell Ponto, DO

## 2022-10-20 DIAGNOSIS — R030 Elevated blood-pressure reading, without diagnosis of hypertension: Secondary | ICD-10-CM | POA: Diagnosis not present

## 2022-10-20 DIAGNOSIS — Z6839 Body mass index (BMI) 39.0-39.9, adult: Secondary | ICD-10-CM | POA: Diagnosis not present

## 2022-10-20 DIAGNOSIS — R059 Cough, unspecified: Secondary | ICD-10-CM | POA: Diagnosis not present

## 2022-10-20 DIAGNOSIS — E669 Obesity, unspecified: Secondary | ICD-10-CM | POA: Diagnosis not present

## 2022-10-20 DIAGNOSIS — J209 Acute bronchitis, unspecified: Secondary | ICD-10-CM | POA: Diagnosis not present

## 2022-10-26 ENCOUNTER — Ambulatory Visit (INDEPENDENT_AMBULATORY_CARE_PROVIDER_SITE_OTHER): Payer: Medicare PPO | Admitting: Family Medicine

## 2022-10-26 ENCOUNTER — Encounter (INDEPENDENT_AMBULATORY_CARE_PROVIDER_SITE_OTHER): Payer: Self-pay | Admitting: Family Medicine

## 2022-10-26 VITALS — BP 135/81 | HR 77 | Temp 98.2°F | Ht 63.0 in | Wt 215.0 lb

## 2022-10-26 DIAGNOSIS — E669 Obesity, unspecified: Secondary | ICD-10-CM

## 2022-10-26 DIAGNOSIS — R7303 Prediabetes: Secondary | ICD-10-CM | POA: Diagnosis not present

## 2022-10-26 DIAGNOSIS — Z6838 Body mass index (BMI) 38.0-38.9, adult: Secondary | ICD-10-CM | POA: Diagnosis not present

## 2022-10-26 DIAGNOSIS — E538 Deficiency of other specified B group vitamins: Secondary | ICD-10-CM

## 2022-10-26 DIAGNOSIS — E559 Vitamin D deficiency, unspecified: Secondary | ICD-10-CM | POA: Diagnosis not present

## 2022-10-26 DIAGNOSIS — E88819 Insulin resistance, unspecified: Secondary | ICD-10-CM | POA: Insufficient documentation

## 2022-10-26 MED ORDER — VITAMIN D (ERGOCALCIFEROL) 1.25 MG (50000 UNIT) PO CAPS: 50000.0000 [IU] | ORAL_CAPSULE | ORAL | 0 refills | Status: DC

## 2022-10-26 MED ORDER — CYANOCOBALAMIN 500 MCG PO TABS: 500.0000 ug | ORAL_TABLET | Freq: Every day | ORAL | 0 refills | Status: DC

## 2022-11-13 NOTE — Progress Notes (Signed)
Chief Complaint:   OBESITY Kristine Olson is here to discuss her progress with her obesity treatment plan along with follow-up of her obesity related diagnoses. Kristine Olson is on the Category 1 Plan and states she is following her eating plan approximately 40% of the time. Kristine Olson states she is not exercising.  Today's visit was #: 2 Starting weight: 222 lbs Starting date: 10/12/2022 Today's weight: 215 lbs Today's date: 10/26/2022 Total lbs lost to date: 7 lbs Total lbs lost since last in-office visit: 7 lbs  Interim History: Patient was sick with bronchitis starting on 10/14/2022.  Required azithromycin and steroid injection.  Denies much of an appetite.  Hydrating well with water.  Still snacking at night.  Plans to get back on meal plan foods this week.  Subjective:   1. B12 deficiency Worsening.   Discussed labs with patient today.  B12 level low at 230, 10/12/2022.  Patient complains of fatigue. Denies vegan diet. Patient denies paresthesias, and has never taken a B12 supplement.  2. Vitamin D deficiency Worsening.  Discussed labs with patient today. Vitamin D level 26.8 on 10/12/2022.  Patient has a history of osteopenia.  Patient complains of fatigue.  Patient is taking OTC vitamin D 1000 IU daily.  3. Insulin resistance Discussed labs with patient today. Patient is fasting insulin 17.9 on 10/12/2022.  Patient complains of cravings and hunger at night.  4. Pre-diabetes Patient A1c on 10/12/2022, was 5.8.  Patient has never taken metformin.  Assessment/Plan:   1. B12 deficiency Recheck in 3 to 4 months.  Begin- cyanocobalamin (VITAMIN B12) 500 MCG tablet; Take 1 tablet (500 mcg total) by mouth daily.  Dispense: 30 tablet; Refill: 0  2. Vitamin D deficiency Recheck level in 3 to 4 months.  Begin- Vitamin D, Ergocalciferol, (DRISDOL) 1.25 MG (50000 UNIT) CAPS capsule; Take 1 capsule (50,000 Units total) by mouth every 7 (seven) days.  Dispense: 5 capsule; Refill:  0  3. Insulin resistance Actively working on increasing protein intake and decreasing sugar intake.  Plan to increase walking time.  4. Pre-diabetes Patient is actively working on prescribed diet, consider metformin use.  5. Obesity,current BMI 38.1 1.  Restart category 1 meal plan. 2.  Hydrate well with water, herbal tea while sick.  Kristine Olson is currently in the action stage of change. As such, her goal is to continue with weight loss efforts. She has agreed to the Category 1 Plan.   Exercise goals: All adults should avoid inactivity. Some physical activity is better than none, and adults who participate in any amount of physical activity gain some health benefits.  Behavioral modification strategies: increasing lean protein intake, increasing vegetables, increasing water intake, meal planning and cooking strategies, keeping healthy foods in the home, better snacking choices, avoiding temptations, and planning for success.  Kristine Olson has agreed to follow-up with our clinic in 3-4 weeks. She was informed of the importance of frequent follow-up visits to maximize her success with intensive lifestyle modifications for her multiple health conditions.   Objective:   Blood pressure 135/81, pulse 77, temperature 98.2 F (36.8 C), height '5\' 3"'$  (1.6 m), weight 215 lb (97.5 kg), SpO2 97 %. Body mass index is 38.09 kg/m.  General: Cooperative, alert, well developed, in no acute distress. HEENT: Conjunctivae and lids unremarkable. Cardiovascular: Regular rhythm.  Lungs: Normal work of breathing. Neurologic: No focal deficits.   Lab Results  Component Value Date   CREATININE 1.06 08/07/2022   BUN 20 08/07/2022   NA 140 08/07/2022  K 4.7 08/07/2022   CL 103 08/07/2022   CO2 28 08/07/2022   Lab Results  Component Value Date   ALT 10 08/07/2022   AST 15 08/07/2022   ALKPHOS 70 08/07/2022   BILITOT 0.2 08/07/2022   Lab Results  Component Value Date   HGBA1C 5.8 (H) 10/12/2022    HGBA1C 5.9 11/25/2014   HGBA1C 5.6 08/23/2012   Lab Results  Component Value Date   INSULIN 17.9 10/12/2022   Lab Results  Component Value Date   TSH 0.936 10/12/2022   Lab Results  Component Value Date   CHOL 170 08/07/2022   HDL 58.70 08/07/2022   LDLCALC 95 08/07/2022   TRIG 85.0 08/07/2022   CHOLHDL 3 08/07/2022   Lab Results  Component Value Date   VD25OH 26.8 (L) 10/12/2022   VD25OH 31.76 02/08/2022   VD25OH 36 02/03/2021   Lab Results  Component Value Date   WBC 7.5 08/07/2022   HGB 12.4 08/07/2022   HCT 38.1 08/07/2022   MCV 84.9 08/07/2022   PLT 223.0 08/07/2022   Lab Results  Component Value Date   IRON 26 (L) 11/14/2021   TIBC 475 (H) 11/14/2021   FERRITIN 5 (L) 11/14/2021   Attestation Statements:   Reviewed by clinician on day of visit: allergies, medications, problem list, medical history, surgical history, family history, social history, and previous encounter notes.  I, Davy Pique, am acting as Location manager for Loyal Gambler, DO.  I have reviewed the above documentation for accuracy and completeness, and I agree with the above. Dell Ponto, DO

## 2022-11-16 DIAGNOSIS — L738 Other specified follicular disorders: Secondary | ICD-10-CM | POA: Diagnosis not present

## 2022-11-22 ENCOUNTER — Encounter (INDEPENDENT_AMBULATORY_CARE_PROVIDER_SITE_OTHER): Payer: Self-pay | Admitting: Family Medicine

## 2022-11-22 ENCOUNTER — Other Ambulatory Visit: Payer: Self-pay | Admitting: Family Medicine

## 2022-11-22 ENCOUNTER — Ambulatory Visit (INDEPENDENT_AMBULATORY_CARE_PROVIDER_SITE_OTHER): Payer: Medicare PPO | Admitting: Family Medicine

## 2022-11-22 VITALS — BP 122/73 | HR 74 | Temp 97.5°F | Ht 63.0 in | Wt 206.0 lb

## 2022-11-22 DIAGNOSIS — E669 Obesity, unspecified: Secondary | ICD-10-CM

## 2022-11-22 DIAGNOSIS — R7303 Prediabetes: Secondary | ICD-10-CM | POA: Diagnosis not present

## 2022-11-22 DIAGNOSIS — M8580 Other specified disorders of bone density and structure, unspecified site: Secondary | ICD-10-CM | POA: Diagnosis not present

## 2022-11-22 DIAGNOSIS — G47411 Narcolepsy with cataplexy: Secondary | ICD-10-CM | POA: Diagnosis not present

## 2022-11-22 DIAGNOSIS — E559 Vitamin D deficiency, unspecified: Secondary | ICD-10-CM

## 2022-11-22 DIAGNOSIS — E88819 Insulin resistance, unspecified: Secondary | ICD-10-CM

## 2022-11-22 DIAGNOSIS — Z6841 Body Mass Index (BMI) 40.0 and over, adult: Secondary | ICD-10-CM | POA: Diagnosis not present

## 2022-11-22 MED ORDER — VITAMIN D (ERGOCALCIFEROL) 1.25 MG (50000 UNIT) PO CAPS: 50000.0000 [IU] | ORAL_CAPSULE | ORAL | 0 refills | Status: DC

## 2022-11-23 ENCOUNTER — Other Ambulatory Visit: Payer: Self-pay | Admitting: Family Medicine

## 2022-11-23 DIAGNOSIS — R7303 Prediabetes: Secondary | ICD-10-CM | POA: Insufficient documentation

## 2022-11-23 DIAGNOSIS — G47411 Narcolepsy with cataplexy: Secondary | ICD-10-CM | POA: Insufficient documentation

## 2022-12-06 DIAGNOSIS — F411 Generalized anxiety disorder: Secondary | ICD-10-CM | POA: Diagnosis not present

## 2022-12-11 ENCOUNTER — Ambulatory Visit (INDEPENDENT_AMBULATORY_CARE_PROVIDER_SITE_OTHER): Payer: Medicare PPO | Admitting: Family Medicine

## 2022-12-14 NOTE — Progress Notes (Signed)
Chief Complaint:   OBESITY Kristine Olson is here to discuss her progress with her obesity treatment plan along with follow-up of her obesity related diagnoses. Kristine Olson is on the Category 1 Plan and states she is following her eating plan approximately 95% of the time. Kristine Olson states she is not currently exercising.  Today's visit was #: 3 Starting weight: 222 lbs Starting date: 10/12/2022 Today's weight: 206 lbs Today's date: 11/22/2022 Total lbs lost to date: 16 Total lbs lost since last in-office visit: 9  Interim History: Pt is cutting back on sweets. She is getting in all meats most days, and getting in vegetables and some fruits. She feels full but craves sweets. Pt was used to having a sweet treat after lunch and dinner. He energy level and knee pain are improving.  Subjective:   1. Transient total loss of muscle tone Lost 3.4 lbs of muscle mass in the past 4 weeks.  2. Insulin resistance Fasting insulin 17.9 on 12/21 Sugar cravings are still there but controlling them well. Long history of eating desserts.  3. Prediabetes A1c 5.8 on 12/21 Actively down 16 lbs in 1 month. Pt is adherent to meal plan.   4. Osteopenia, unspecified location Doing well on prescription Vitamin D weekly. Last Vit D level 26.8  5. Vitamin D deficiency Taking prescription Vitamin D 50,000 IU weekly.  Assessment/Plan:   1. Transient total loss of muscle tone Increase protein intake with options given on meat alternatives. Add in more consistent exercises.  2. Insulin resistance Focus on behavior changes, portion control, and avoiding triggers  3. Prediabetes Continue prescribed diet, adding in more walking. Recheck A1c in 2 months.  4. Osteopenia, unspecified location Recheck Vit D in 2 months. Continue current treatment plan.  5. Vitamin D deficiency Continue current treatment plan.  Refill- Vitamin D, Ergocalciferol, (DRISDOL) 1.25 MG (50000 UNIT) CAPS capsule; Take 1 capsule  (50,000 Units total) by mouth every 7 (seven) days.  Dispense: 5 capsule; Refill: 0  6. Obesity, current BMI 43.1 Look at home exercise options; has a bike at home. Low cal/low sugar snacks and drink options reviewed.  Kristine Olson is currently in the action stage of change. As such, her goal is to continue with weight loss efforts. She has agreed to the Category 1 Plan.   Exercise goals: All adults should avoid inactivity. Some physical activity is better than none, and adults who participate in any amount of physical activity gain some health benefits.  Behavioral modification strategies: increasing lean protein intake, increasing water intake, decreasing liquid calories, increasing high fiber foods, meal planning and cooking strategies, keeping healthy foods in the home, better snacking choices, and decreasing junk food.  Kristine Olson has agreed to follow-up with our clinic in 4 weeks. She was informed of the importance of frequent follow-up visits to maximize her success with intensive lifestyle modifications for her multiple health conditions.   Objective:   Blood pressure 122/73, pulse 74, temperature (!) 97.5 F (36.4 C), height '5\' 3"'$  (1.6 m), weight 206 lb (93.4 kg), SpO2 98 %. Body mass index is 36.49 kg/m.  General: Cooperative, alert, well developed, in no acute distress. HEENT: Conjunctivae and lids unremarkable. Cardiovascular: Regular rhythm.  Lungs: Normal work of breathing. Neurologic: No focal deficits.   Lab Results  Component Value Date   CREATININE 1.06 08/07/2022   BUN 20 08/07/2022   NA 140 08/07/2022   K 4.7 08/07/2022   CL 103 08/07/2022   CO2 28 08/07/2022   Lab  Results  Component Value Date   ALT 10 08/07/2022   AST 15 08/07/2022   ALKPHOS 70 08/07/2022   BILITOT 0.2 08/07/2022   Lab Results  Component Value Date   HGBA1C 5.8 (H) 10/12/2022   HGBA1C 5.9 11/25/2014   HGBA1C 5.6 08/23/2012   Lab Results  Component Value Date   INSULIN 17.9  10/12/2022   Lab Results  Component Value Date   TSH 0.936 10/12/2022   Lab Results  Component Value Date   CHOL 170 08/07/2022   HDL 58.70 08/07/2022   LDLCALC 95 08/07/2022   TRIG 85.0 08/07/2022   CHOLHDL 3 08/07/2022   Lab Results  Component Value Date   VD25OH 26.8 (L) 10/12/2022   VD25OH 31.76 02/08/2022   VD25OH 36 02/03/2021   Lab Results  Component Value Date   WBC 7.5 08/07/2022   HGB 12.4 08/07/2022   HCT 38.1 08/07/2022   MCV 84.9 08/07/2022   PLT 223.0 08/07/2022   Lab Results  Component Value Date   IRON 26 (L) 11/14/2021   TIBC 475 (H) 11/14/2021   FERRITIN 5 (L) 11/14/2021    Attestation Statements:   Reviewed by clinician on day of visit: allergies, medications, problem list, medical history, surgical history, family history, social history, and previous encounter notes.  I, Kathlene November, BS, CMA, am acting as transcriptionist for Loyal Gambler, DO.   I have reviewed the above documentation for accuracy and completeness, and I agree with the above. Dell Ponto, DO

## 2022-12-18 ENCOUNTER — Ambulatory Visit (INDEPENDENT_AMBULATORY_CARE_PROVIDER_SITE_OTHER): Payer: Medicare PPO | Admitting: Family Medicine

## 2022-12-21 ENCOUNTER — Ambulatory Visit (INDEPENDENT_AMBULATORY_CARE_PROVIDER_SITE_OTHER): Payer: Medicare PPO | Admitting: Family Medicine

## 2022-12-27 DIAGNOSIS — H903 Sensorineural hearing loss, bilateral: Secondary | ICD-10-CM | POA: Diagnosis not present

## 2023-01-01 DIAGNOSIS — F411 Generalized anxiety disorder: Secondary | ICD-10-CM | POA: Diagnosis not present

## 2023-01-03 ENCOUNTER — Telehealth: Payer: Self-pay | Admitting: Family Medicine

## 2023-01-03 NOTE — Telephone Encounter (Signed)
Called patient to schedule Medicare Annual Wellness Visit (AWV). Left message for patient to call back and schedule Medicare Annual Wellness Visit (AWV).  Last date of AWV: 10/06/2021   Please schedule an AWVS appointment at any time with [facility].  If any questions, please contact me at [phone].   Thank you,  [place] Direct dial  [phone]

## 2023-01-04 ENCOUNTER — Ambulatory Visit (INDEPENDENT_AMBULATORY_CARE_PROVIDER_SITE_OTHER): Payer: Medicare PPO | Admitting: Family Medicine

## 2023-01-04 ENCOUNTER — Encounter (INDEPENDENT_AMBULATORY_CARE_PROVIDER_SITE_OTHER): Payer: Self-pay | Admitting: Family Medicine

## 2023-01-04 VITALS — BP 121/66 | HR 75 | Temp 97.5°F | Ht 63.0 in | Wt 197.0 lb

## 2023-01-04 DIAGNOSIS — E559 Vitamin D deficiency, unspecified: Secondary | ICD-10-CM | POA: Diagnosis not present

## 2023-01-04 DIAGNOSIS — E669 Obesity, unspecified: Secondary | ICD-10-CM | POA: Diagnosis not present

## 2023-01-04 DIAGNOSIS — Z6835 Body mass index (BMI) 35.0-35.9, adult: Secondary | ICD-10-CM | POA: Insufficient documentation

## 2023-01-04 MED ORDER — VITAMIN D (ERGOCALCIFEROL) 1.25 MG (50000 UNIT) PO CAPS: 50000.0000 [IU] | ORAL_CAPSULE | ORAL | 0 refills | Status: DC

## 2023-01-10 NOTE — Progress Notes (Signed)
Chief Complaint:   OBESITY Kristine Olson is here to discuss her progress with her obesity treatment plan along with follow-up of her obesity related diagnoses. Kristine Olson is on the Category 1 Plan and states she is following her eating plan approximately 80% of the time. Kristine Olson states she has been walking.  Today's visit was #: 4 Starting weight: 222 lbs Starting date: 10/12/2022 Today's weight: 197 lbs Today's date: 01/04/2023 Total lbs lost to date: 25 Total lbs lost since last in-office visit: 9  Interim History: Kristine Olson continues to do well with weight loss.  She is struggling to eat all of her protein and her muscle mass has decreased.  Subjective:   1. Vitamin D deficiency Kristine Olson is on vitamin D, with no nausea, vomiting, or muscle weakness.  She is at risk of over replacement.  Assessment/Plan:   1. Vitamin D deficiency Kristine Olson will continue prescription vitamin D, and we will refill for 1 month.  We will recheck labs in 1 month.  - Vitamin D, Ergocalciferol, (DRISDOL) 1.25 MG (50000 UNIT) CAPS capsule; Take 1 capsule (50,000 Units total) by mouth every 7 (seven) days.  Dispense: 5 capsule; Refill: 0  2. BMI 35.0-35.9,adult  3. Obesity, Beginning BMI 38.79 Kristine Olson is currently in the action stage of change. As such, her goal is to continue with weight loss efforts. She has agreed to the Category 1 Plan.   We will recheck fasting labs at her next visit.   Exercise goals: As is.   Behavioral modification strategies: increasing lean protein intake and meal planning and cooking strategies.  Kristine Olson has agreed to follow-up with our clinic in 3 weeks. She was informed of the importance of frequent follow-up visits to maximize her success with intensive lifestyle modifications for her multiple health conditions.   Objective:   Blood pressure 121/66, pulse 75, temperature (!) 97.5 F (36.4 C), height 5\' 3"  (1.6 m), weight 197 lb (89.4 kg), SpO2 93 %. Body  mass index is 34.9 kg/m.  Lab Results  Component Value Date   CREATININE 1.06 08/07/2022   BUN 20 08/07/2022   NA 140 08/07/2022   K 4.7 08/07/2022   CL 103 08/07/2022   CO2 28 08/07/2022   Lab Results  Component Value Date   ALT 10 08/07/2022   AST 15 08/07/2022   ALKPHOS 70 08/07/2022   BILITOT 0.2 08/07/2022   Lab Results  Component Value Date   HGBA1C 5.8 (H) 10/12/2022   HGBA1C 5.9 11/25/2014   HGBA1C 5.6 08/23/2012   Lab Results  Component Value Date   INSULIN 17.9 10/12/2022   Lab Results  Component Value Date   TSH 0.936 10/12/2022   Lab Results  Component Value Date   CHOL 170 08/07/2022   HDL 58.70 08/07/2022   LDLCALC 95 08/07/2022   TRIG 85.0 08/07/2022   CHOLHDL 3 08/07/2022   Lab Results  Component Value Date   VD25OH 26.8 (L) 10/12/2022   VD25OH 31.76 02/08/2022   VD25OH 36 02/03/2021   Lab Results  Component Value Date   WBC 7.5 08/07/2022   HGB 12.4 08/07/2022   HCT 38.1 08/07/2022   MCV 84.9 08/07/2022   PLT 223.0 08/07/2022   Lab Results  Component Value Date   IRON 26 (L) 11/14/2021   TIBC 475 (H) 11/14/2021   FERRITIN 5 (L) 11/14/2021   Attestation Statements:   Reviewed by clinician on day of visit: allergies, medications, problem list, medical history, surgical history, family history, social history, and  previous encounter notes.   I, Trixie Dredge, am acting as transcriptionist for Dennard Nip, MD.  I have reviewed the above documentation for accuracy and completeness, and I agree with the above. -  Dennard Nip, MD

## 2023-01-12 ENCOUNTER — Telehealth: Payer: Self-pay | Admitting: Family Medicine

## 2023-01-12 NOTE — Telephone Encounter (Signed)
Called patient to schedule Medicare Annual Wellness Visit (AWV). Left message for patient to call back and schedule Medicare Annual Wellness Visit (AWV).  Last date of AWV: 10/06/2021   Please schedule an AWVS appointment at any time with [place] [facility].  If any questions, please contact me at [phone].   Thank you,  Kristine Olson Direct dial  [phone]

## 2023-01-18 ENCOUNTER — Telehealth: Payer: Self-pay | Admitting: Family Medicine

## 2023-01-18 NOTE — Telephone Encounter (Signed)
Contacted Kirke Corin to schedule their annual wellness visit. Appointment made for 01/25/2023.  Thank you,  [place] Direct dial  [phone]

## 2023-01-24 ENCOUNTER — Ambulatory Visit (INDEPENDENT_AMBULATORY_CARE_PROVIDER_SITE_OTHER): Payer: Medicare PPO | Admitting: Physician Assistant

## 2023-01-25 ENCOUNTER — Telehealth: Payer: Self-pay | Admitting: Family Medicine

## 2023-01-25 ENCOUNTER — Ambulatory Visit (INDEPENDENT_AMBULATORY_CARE_PROVIDER_SITE_OTHER): Payer: Medicare PPO | Admitting: *Deleted

## 2023-01-25 DIAGNOSIS — Z0000 Encounter for general adult medical examination without abnormal findings: Secondary | ICD-10-CM

## 2023-01-25 NOTE — Telephone Encounter (Signed)
Pt would like to know if she can go to family function this weekend and calling tomorrow for same day visit ... Advise

## 2023-01-25 NOTE — Telephone Encounter (Signed)
Patient will need an appointment if she is looking for advice from Dr Birdie Riddle

## 2023-01-25 NOTE — Telephone Encounter (Signed)
Pt is coming in tomorrow just an FYI pt wants to know if she can go to a family function this weekend she is still testing positive for COVID

## 2023-01-25 NOTE — Patient Instructions (Signed)
Kristine Olson , Thank you for taking time to come for your Medicare Wellness Visit. I appreciate your ongoing commitment to your health goals. Please review the following plan we discussed and let me know if I can assist you in the future.   Screening recommendations/referrals: Colonoscopy: up to date Mammogram: Education provided Bone Density: Education provided Recommended yearly ophthalmology/optometry visit for glaucoma screening and checkup Recommended yearly dental visit for hygiene and checkup  Vaccinations: Influenza vaccine: up to date Pneumococcal vaccine: up to date Tdap vaccine: education provided Shingles vaccine: never done         Preventive Care 75 Years and Older, Female Preventive care refers to lifestyle choices and visits with your health care provider that can promote health and wellness. What does preventive care include? A yearly physical exam. This is also called an annual well check. Dental exams once or twice a year. Routine eye exams. Ask your health care provider how often you should have your eyes checked. Personal lifestyle choices, including: Daily care of your teeth and gums. Regular physical activity. Eating a healthy diet. Avoiding tobacco and drug use. Limiting alcohol use. Practicing safe sex. Taking low-dose aspirin every day. Taking vitamin and mineral supplements as recommended by your health care provider. What happens during an annual well check? The services and screenings done by your health care provider during your annual well check will depend on your age, overall health, lifestyle risk factors, and family history of disease. Counseling  Your health care provider may ask you questions about your: Alcohol use. Tobacco use. Drug use. Emotional well-being. Home and relationship well-being. Sexual activity. Eating habits. History of falls. Memory and ability to understand (cognition). Work and work Statistician. Reproductive  health. Screening  You may have the following tests or measurements: Height, weight, and BMI. Blood pressure. Lipid and cholesterol levels. These may be checked every 5 years, or more frequently if you are over 75 years old. Skin check. Lung cancer screening. You may have this screening every year starting at age 75 if you have a 30-pack-year history of smoking and currently smoke or have quit within the past 15 years. Fecal occult blood test (FOBT) of the stool. You may have this test every year starting at age 75. Flexible sigmoidoscopy or colonoscopy. You may have a sigmoidoscopy every 5 years or a colonoscopy every 10 years starting at age 75. Hepatitis C blood test. Hepatitis B blood test. Sexually transmitted disease (STD) testing. Diabetes screening. This is done by checking your blood sugar (glucose) after you have not eaten for a while (fasting). You may have this done every 1-3 years. Bone density scan. This is done to screen for osteoporosis. You may have this done starting at age 75. Mammogram. This may be done every 1-2 years. Talk to your health care provider about how often you should have regular mammograms. Talk with your health care provider about your test results, treatment options, and if necessary, the need for more tests. Vaccines  Your health care provider may recommend certain vaccines, such as: Influenza vaccine. This is recommended every year. Tetanus, diphtheria, and acellular pertussis (Tdap, Td) vaccine. You may need a Td booster every 10 years. Zoster vaccine. You may need this after age 75. Pneumococcal 13-valent conjugate (PCV13) vaccine. One dose is recommended after age 75. Pneumococcal polysaccharide (PPSV23) vaccine. One dose is recommended after age 75. Talk to your health care provider about which screenings and vaccines you need and how often you need them. This information  is not intended to replace advice given to you by your health care provider.  Make sure you discuss any questions you have with your health care provider. Document Released: 11/05/2015 Document Revised: 06/28/2016 Document Reviewed: 08/10/2015 Elsevier Interactive Patient Education  2017 [place] Prevention in the Home Falls can cause injuries. They can happen to people of all ages. There are many things you can do to make your home safe and to help prevent falls. What can I do on the outside of my home? Regularly fix the edges of walkways and driveways and fix any cracks. Remove anything that might make you trip as you walk through a door, such as a raised step or threshold. Trim any bushes or trees on the path to your home. Use bright outdoor lighting. Clear any walking paths of anything that might make someone trip, such as rocks or tools. Regularly check to see if handrails are loose or broken. Make sure that both sides of any steps have handrails. Any raised decks and porches should have guardrails on the edges. Have any leaves, snow, or ice cleared regularly. Use sand or salt on walking paths during winter. Clean up any spills in your garage right away. This includes oil or grease spills. What can I do in the bathroom? Use night lights. Install grab bars by the toilet and in the tub and shower. Do not use towel bars as grab bars. Use non-skid mats or decals in the tub or shower. If you need to sit down in the shower, use a plastic, non-slip stool. Keep the floor dry. Clean up any water that spills on the floor as soon as it happens. Remove soap buildup in the tub or shower regularly. Attach bath mats securely with double-sided non-slip rug tape. Do not have throw rugs and other things on the floor that can make you trip. What can I do in the bedroom? Use night lights. Make sure that you have a light by your bed that is easy to reach. Do not use any sheets or blankets that are too big for your bed. They should not hang down onto the floor. Have a  firm chair that has side arms. You can use this for support while you get dressed. Do not have throw rugs and other things on the floor that can make you trip. What can I do in the kitchen? Clean up any spills right away. Avoid walking on wet floors. Keep items that you use a lot in easy-to-reach places. If you need to reach something above you, use a strong step stool that has a grab bar. Keep electrical cords out of the way. Do not use floor polish or wax that makes floors slippery. If you must use wax, use non-skid floor wax. Do not have throw rugs and other things on the floor that can make you trip. What can I do with my stairs? Do not leave any items on the stairs. Make sure that there are handrails on both sides of the stairs and use them. Fix handrails that are broken or loose. Make sure that handrails are as long as the stairways. Check any carpeting to make sure that it is firmly attached to the stairs. Fix any carpet that is loose or worn. Avoid having throw rugs at the top or bottom of the stairs. If you do have throw rugs, attach them to the floor with carpet tape. Make sure that you have a light switch at the top of  the stairs and the bottom of the stairs. If you do not have them, ask someone to add them for you. What else can I do to help prevent falls? Wear shoes that: Do not have high heels. Have rubber bottoms. Are comfortable and fit you well. Are closed at the toe. Do not wear sandals. If you use a stepladder: Make sure that it is fully opened. Do not climb a closed stepladder. Make sure that both sides of the stepladder are locked into place. Ask someone to hold it for you, if possible. Clearly mark and make sure that you can see: Any grab bars or handrails. First and last steps. Where the edge of each step is. Use tools that help you move around (mobility aids) if they are needed. These include: Canes. Walkers. Scooters. Crutches. Turn on the lights when you  go into a dark area. Replace any light bulbs as soon as they burn out. Set up your furniture so you have a clear path. Avoid moving your furniture around. If any of your floors are uneven, fix them. If there are any pets around you, be aware of where they are. Review your medicines with your doctor. Some medicines can make you feel dizzy. This can increase your chance of falling. Ask your doctor what other things that you can do to help prevent falls. This information is not intended to replace advice given to you by your health care provider. Make sure you discuss any questions you have with your health care provider. Document Released: 08/05/2009 Document Revised: 03/16/2016 Document Reviewed: 11/13/2014 Elsevier Interactive Patient Education  2017 [employer].

## 2023-01-25 NOTE — Telephone Encounter (Signed)
Caller name: Kirke Corin  On DPR?: Yes  Call back [phone]  Provider they see: Midge Minium, MD  Reason for call:Took Covid test exposed 3/17 negative 3/21 tested positive 3/26 didn't test  but was probably positive and is still positive 01/25/23. Advise

## 2023-01-25 NOTE — Progress Notes (Signed)
Subjective:   Kristine Olson is a 77 y.o. female who presents for Medicare Annual (Subsequent) preventive examination.  I connected with  Kirke Corin on 01/25/23 by a telephone enabled telemedicine application and verified that I am speaking with the correct person using two identifiers.   I discussed the limitations of evaluation and management by telemedicine. The patient expressed understanding and agreed to proceed.  Patient location: home  Provider location: telephone/home    Review of Systems     Cardiac Risk Factors include: advanced age (>62men, >34 women);obesity (BMI >30kg/m2);sedentary lifestyle;hypertension     Objective:    Today's Vitals   There is no height or weight on file to calculate BMI.     01/25/2023    2:03 PM 10/06/2021    2:39 PM 02/15/2021    8:52 AM 06/01/2020    1:38 PM 04/24/2018    9:33 AM 08/27/2017    4:00 PM 08/27/2017   11:18 AM  Advanced Directives  Does Patient Have a Medical Advance Directive? Yes Yes Yes Yes Yes Yes Yes  Type of Paramedic of [place];Living will [place];Living will;Out of facility DNR (pink MOST or yellow form) [place];Living will [place];Living will [place];Living will [place];Living will  Does patient want to make changes to medical advance directive?      No - Patient declined   Copy of [place] in Chart? No - copy requested No - copy requested  No - copy requested  No - copy requested No - copy requested    Current Medications (verified) Outpatient Encounter Medications as of 01/25/2023  Medication Sig   cetirizine (ZYRTEC) 10 MG tablet Take 10 mg by mouth daily as needed for allergies.   cholecalciferol (VITAMIN D) 1000 units tablet Take 1,000 Units by mouth daily.   citalopram (CELEXA) 20 MG tablet Take 20 mg by mouth daily.    cyanocobalamin (VITAMIN B12) 500 MCG tablet Take 1 tablet (500 mcg total) by mouth daily.   levothyroxine (SYNTHROID) 112 MCG tablet TAKE 1 TABLET DAILY EXCEPT FOR 1 AND 1/2 TABLETS ON WEDNESDAY   metoprolol tartrate (LOPRESSOR) 25 MG tablet TAKE 1/2 TABLET TWICE A DAY BY MOUTH   Multiple Vitamins-Minerals (PRESERVISION AREDS 2+MULTI VIT PO) Take by mouth.   pantoprazole (PROTONIX) 20 MG tablet TAKE 1 TABLET BY MOUTH EVERY DAY   spironolactone (ALDACTONE) 25 MG tablet TAKE 1 TABLET BY MOUTH EVERY DAY   Vitamin D, Ergocalciferol, (DRISDOL) 1.25 MG (50000 UNIT) CAPS capsule Take 1 capsule (50,000 Units total) by mouth every 7 (seven) days.   No facility-administered encounter medications on file as of 01/25/2023.    Allergies (verified) Cefaclor, Penicillins, Sulfa antibiotics, and Advair diskus [fluticasone-salmeterol]   History: Past Medical History:  Diagnosis Date   Acute pyloric channel ulcer with hemorrhage on Xarelto    Anemia    history of anemia after bleeding ulcer   Arthritis    ostearthritis - right  medial knee   Back pain    Cataract    beginning cataracts   Complication of anesthesia    was given a medication while waiting to go to surgery that caused burning sensation on scalp was given another medication and the burning went away   COVID-19 2022   Depression    Edema    GERD (gastroesophageal reflux disease)    "heartburn daily"-TUMS helpful  Hemorrhoid    History of blood transfusion    2 units PRBC   History of stomach ulcers    Hx of sinus tachycardia    rarely any problems since metoprolol use   Hyperlipidemia    patient unaware, has never taking medication   Hypertension    Hypothyroidism    Joint pain    Osteoporosis    osteopenia   Sleep apnea    uses c pap   SOB (shortness of breath)    Varicose vein of leg    Vitamin D deficiency    Past Surgical History:  Procedure Laterality Date   BIOPSY BREAST     2 biopsies & 2 aspirations   CESAREAN  SECTION     CLOSED REDUCTION TOE FRACTURE     COLONOSCOPY  2015   COLONOSCOPY W/ POLYPECTOMY  multiple since 2007   adenomas, diverticulosis;last 2015   ESOPHAGOGASTRODUODENOSCOPY (EGD) WITH PROPOFOL N/A 05/29/2016   Procedure: ESOPHAGOGASTRODUODENOSCOPY (EGD) WITH PROPOFOL;  Surgeon: Gatha Mayer, MD;  Location: [facility];  Service: Endoscopy;  Laterality: N/A;   HAMMER TOE SURGERY     PARTIAL KNEE ARTHROPLASTY Right 05/22/2016   Procedure: RIGHT KNEE MEDIAL UNICOMPARTMENTAL ARTHROPLASTY;  Surgeon: Gaynelle Arabian, MD;  Location: [facility];  Service: Orthopedics;  Laterality: Right;   TONSILLECTOMY AND ADENOIDECTOMY     TOTAL KNEE ARTHROPLASTY Left 08/27/2017   Procedure: LEFT TOTAL KNEE ARTHROPLASTY;  Surgeon: Gaynelle Arabian, MD;  Location: [facility];  Service: Orthopedics;  Laterality: Left;   WISDOM TOOTH EXTRACTION     Family History  Problem Relation Age of Onset   Hypertension Mother    Osteoporosis Mother    Thyroid disease Mother    Rheum arthritis Mother    Hypertension Father    Alcohol abuse Father    Liver disease Father    Obesity Father    Alcoholism Father    Depression Father    Melanoma Brother    Cancer Maternal Grandmother    Heart attack Maternal Grandfather        > 16   Multiple sclerosis Daughter 52   Depression Daughter    Bipolar disorder Daughter    Stroke Maternal Aunt         X 3    Diabetes Maternal Aunt    Deep vein thrombosis Paternal Aunt    Other Paternal Aunt        Phlebitis without deep venous thrombosis   Colon cancer Neg Hx    Colon polyps Neg Hx    Esophageal cancer Neg Hx    Rectal cancer Neg Hx    Stomach cancer Neg Hx    Social History   Socioeconomic History   Marital status: Married    Spouse name: Not on file   Number of children: 3   Years of education: Not on file   Highest education level: Not on file  Occupational History   Occupation: Retired  Tobacco Use   Smoking status: Never   Smokeless tobacco: Never  Vaping  Use   Vaping Use: Never used  Substance and Sexual Activity   Alcohol use: Yes    Alcohol/week: 0.0 standard drinks of alcohol    Comment:  very rarely   Drug use: No   Sexual activity: Yes    Birth control/protection: Post-menopausal  Other Topics Concern   Not on file  Social History Narrative   3 biological children (Set of Identical twins)    3 Step children  Social Determinants of Health   Financial Resource Strain: Low Risk  (10/06/2021)   Overall Financial Resource Strain (CARDIA)    Difficulty of Paying Living Expenses: Not hard at all  Food Insecurity: No Food Insecurity (01/25/2023)   Hunger Vital Sign    Worried About Running Out of Food in the Last Year: Never true    [place] in the Last Year: Never true  Transportation Needs: Unknown (01/25/2023)   PRAPARE - Hydrologist (Medical): No    Lack of Transportation (Non-Medical): Not on file  Physical Activity: Inactive (01/25/2023)   Exercise Vital Sign    Days of Exercise per Week: 0 days    Minutes of Exercise per Session: 0 min  Stress: No Stress Concern Present (01/25/2023)   [place] of Stress : Not at all  Social Connections: [place] (01/25/2023)   Social Connection and Isolation Panel [NHANES]    Frequency of Communication with Friends and Family: More than three times a week    Frequency of Social Gatherings with Friends and Family: More than three times a week    Attends Religious Services: More than 4 times per year    Active Member of [employer] or Organizations: Yes    Attends Music therapist: More than 4 times per year    Marital Status: Married    Tobacco Counseling Counseling given: Not Answered   Clinical Intake:  Pre-visit preparation completed: Yes  Pain : No/denies pain     Diabetes: No  How often do you need to have someone help you when you read  instructions, pamphlets, or other written materials from your doctor or pharmacy?: (P) 1 - Never  Diabetic?  no  Interpreter Needed?: No  Information entered by :: Leroy Kennedy LPN   Activities of Daily Living    01/25/2023    2:04 PM 01/23/2023    6:51 PM  In your present state of health, do you have any difficulty performing the following activities:  Hearing? 1 0  Vision? 0 0  Difficulty concentrating or making decisions? 0 0  Walking or climbing stairs? 1 0  Dressing or bathing? 0 0  Doing errands, shopping? 0 0  Preparing Food and eating ? N N  Using the Toilet? N N  In the past six months, have you accidently leaked urine? N Y  Do you have problems with loss of bowel control? N N  Managing your Medications? N N  Managing your Finances? N N  Housekeeping or managing your Housekeeping? N N    Patient Care Team: Midge Minium, MD as PCP - General (Family Medicine) Charlynn Grimes, MD as Referring Physician (Obstetrics and Gynecology) Gatha Mayer, MD as Consulting Physician (Gastroenterology) Marvia Pickles (Psychiatry) Gaynelle Arabian, MD as Consulting Physician (Orthopedic Surgery) Deneise Lever, MD as Consulting Physician (Pulmonary Disease) Minus Breeding, MD as Consulting Physician (Cardiology) Tat, Eustace Quail, DO as Consulting Physician (Neurology)  Indicate any recent Medical Services you may have received from other than [facility] providers in the past year (date may be approximate).     Assessment:   This is a routine wellness examination for [place].  Hearing/Vision screen Hearing Screening - Comments:: Hearing aids bilateral  Vision Screening - Comments:: Up to date [place]  Dietary issues and exercise activities discussed: Current Exercise Habits: The patient does not participate in regular exercise at present  Goals Addressed             This Visit's Progress    Increase physical activity         Depression Screen    01/25/2023     2:07 PM 08/07/2022   10:06 AM 02/08/2022   10:16 AM 11/14/2021    1:08 PM 10/06/2021    2:46 PM 10/06/2021    2:38 PM 09/13/2021   11:11 AM  PHQ 2/9 Scores  PHQ - 2 Score 0 0 0 0 0 0 0  PHQ- 9 Score 0 2 1    0    Fall Risk    01/25/2023    2:02 PM 01/23/2023    6:51 PM 08/07/2022   10:06 AM 02/08/2022   10:16 AM 11/14/2021    1:08 PM  [place] in the past year? 0 0 0 0 0  Number falls in past yr: 0 0  0 0  Injury with Fall? 0 0  0   Risk for fall due to :   No Fall Risks No Fall Risks   Follow up Falls evaluation completed;Education provided;Falls prevention discussed  Falls evaluation completed Falls evaluation completed     FALL RISK PREVENTION PERTAINING TO THE HOME:  Any stairs in or around the home? Yes  If so, are there any without handrails? No  Home free of loose throw rugs in walkways, pet beds, electrical cords, etc? Yes  Adequate lighting in your home to reduce risk of falls? Yes   ASSISTIVE DEVICES UTILIZED TO PREVENT FALLS:  Life alert? No  Use of a cane, walker or w/c? No  Grab bars in the bathroom? Yes  Shower chair or bench in shower? No  Elevated toilet seat or a handicapped toilet? No   TIMED UP AND GO:  Was the test performed? No .    Cognitive Function:    04/24/2018    9:36 AM  MMSE - Mini Mental State Exam  Orientation to time 5  Orientation to Place 5  Registration 3  Attention/ Calculation 5  Recall 3  Language- name 2 objects 2  Language- repeat 1  Language- follow 3 step command 3  Language- read & follow direction 1  Write a sentence 1  Copy design 1  Total score 30        01/25/2023    2:05 PM 02/08/2022   10:18 AM  6CIT Screen  What Year? 0 points 0 points  What month? 0 points 0 points  What time?  0 points  Count back from 20 0 points 0 points  Months in reverse 0 points 0 points  Repeat phrase 0 points 0 points  Total Score  0 points    Immunizations Immunization History  Administered Date(s)  Administered   Fluad Quad(high Dose 65+) 07/07/2019, 10/29/2020, 09/05/2021   Hepatitis A, Adult 10/04/2012   Hepatitis B, ADULT 10/04/2012   IPV 10/04/2012   Influenza, High Dose Seasonal PF 09/06/2018   Influenza,inj,Quad PF,6+ Mos 10/09/2016, 07/24/2017, 09/06/2018   Influenza-Unspecified 06/23/2012, 06/27/2017   Meningococcal Conjugate 10/04/2012   PFIZER(Purple Top)SARS-COV-2 Vaccination 11/12/2019, 12/03/2019, 09/06/2020   Pneumococcal Conjugate-13 04/17/2016   Pneumococcal Polysaccharide-23 04/20/2017   Td 10/24/2003   Tdap 10/04/2012    TDAP status: Due, Education has been provided regarding the importance of this vaccine. Advised may receive this vaccine at local pharmacy or Health Dept. Aware to provide a copy of the vaccination record if obtained from local pharmacy or Health  Dept. Verbalized acceptance and understanding.  Flu Vaccine status: Up to date  Pneumococcal vaccine status: Up to date  Covid-19 vaccine status: Information provided on how to obtain vaccines.   Qualifies for Shingles Vaccine? Yes   Zostavax completed No   Shingrix Completed?: No.    Education has been provided regarding the importance of this vaccine. Patient has been advised to call insurance company to determine out of pocket expense if they have not yet received this vaccine. Advised may also receive vaccine at local pharmacy or Health Dept. Verbalized acceptance and understanding.  Screening Tests Health Maintenance  Topic Date Due   DEXA SCAN  08/30/2018   DTaP/Tdap/Td (3 - Td or Tdap) 10/04/2022   MAMMOGRAM  02/23/2023   INFLUENZA VACCINE  05/24/2023   Medicare Annual Wellness (AWV)  01/25/2024   COLONOSCOPY (Pts 45-42yrs Insurance coverage will need to be confirmed)  03/05/2025   Pneumonia Vaccine 73+ Years old  Completed   Hepatitis C Screening  Completed   HPV VACCINES  Aged Out   COVID-19 Vaccine  Discontinued   Zoster Vaccines- Shingrix  Discontinued    Health  Maintenance  Health Maintenance Due  Topic Date Due   DEXA SCAN  08/30/2018   DTaP/Tdap/Td (3 - Td or Tdap) 10/04/2022    Colorectal cancer screening: Type of screening: Colonoscopy. Completed 2021. Repeat every 5 years  Mammogram status: Completed 2023. Repeat every year  Bone Density status: Completed 2017. Results reflect: Bone density results: OSTEOPENIA. Repeat every   years.   Will schedule with mammogram  Lung Cancer Screening: (Low Dose CT Chest recommended if Age 26-80 years, 30 pack-year currently smoking OR have quit w/in 15years.) does not qualify.   Lung Cancer Screening Referral:   Additional Screening:  Hepatitis C Screening: does not qualify; Completed 2023  Vision Screening: Recommended annual ophthalmology exams for early detection of glaucoma and other disorders of the eye. Is the patient up to date with their annual eye exam?  Yes  Who is the provider or what is the name of the office in which the patient attends annual eye exams? [facility] If pt is not established with a provider, would they like to be referred to a provider to establish care? No .   Dental Screening: Recommended annual dental exams for proper oral hygiene  Community Resource Referral / Chronic Care Management: CRR required this visit?  No   CCM required this visit?  No      Plan:     I have personally reviewed and noted the following in the patient's chart:   Medical and social history Use of alcohol, tobacco or illicit drugs  Current medications and supplements including opioid prescriptions. Patient is not currently taking opioid prescriptions. Functional ability and status Nutritional status Physical activity Advanced directives List of other physicians Hospitalizations, surgeries, and ER visits in previous 12 months Vitals Screenings to include cognitive, depression, and falls Referrals and appointments  In addition, I have reviewed and discussed with patient certain  preventive protocols, quality metrics, and best practice recommendations. A written personalized care plan for preventive services as well as general preventive health recommendations were provided to patient.     Leroy Kennedy, LPN   [id number]   Nurse Notes:

## 2023-01-29 ENCOUNTER — Encounter: Payer: Self-pay | Admitting: Family Medicine

## 2023-01-29 ENCOUNTER — Ambulatory Visit: Payer: Medicare PPO | Admitting: Family Medicine

## 2023-01-29 VITALS — BP 122/80 | HR 72 | Temp 98.4°F | Resp 16 | Ht 63.0 in | Wt 199.0 lb

## 2023-01-29 DIAGNOSIS — U071 COVID-19: Secondary | ICD-10-CM | POA: Diagnosis not present

## 2023-01-29 DIAGNOSIS — Z1152 Encounter for screening for COVID-19: Secondary | ICD-10-CM

## 2023-01-29 LAB — POC COVID19 BINAXNOW: SARS Coronavirus 2 Ag: NEGATIVE

## 2023-01-29 NOTE — Progress Notes (Signed)
   Subjective:    Patient ID: Kristine Olson, female    DOB: January 08, 1946, 77 y.o.   MRN: [id number]  HPI COVID- pt reports she is still testing + after 3 weeks.  Pt reports feeling 'ok'  Was exposed on 3/17 and tested + 1 week later.  Pt reports sxs are mild.  Pt has been concerned bc she continues to test +.  Pt continues to have nasal congestion and ear fullness but also pollen counts are very high.   Review of Systems For ROS see HPI     Objective:   Physical Exam Vitals reviewed.  Constitutional:      General: She is not in acute distress.    Appearance: Normal appearance. She is not ill-appearing.  HENT:     Head: Normocephalic and atraumatic.     Right Ear: Tympanic membrane and ear canal normal.     Left Ear: Tympanic membrane and ear canal normal.     Nose: Congestion present. No rhinorrhea.  Eyes:     Extraocular Movements: Extraocular movements intact.     Conjunctiva/sclera: Conjunctivae normal.     Pupils: Pupils are equal, round, and reactive to light.  Cardiovascular:     Rate and Rhythm: Normal rate and regular rhythm.     Pulses: Normal pulses.     Heart sounds: Normal heart sounds.  Pulmonary:     Effort: Pulmonary effort is normal. No respiratory distress.     Breath sounds: Normal breath sounds. No wheezing or rhonchi.  Musculoskeletal:     Cervical back: Normal range of motion and neck supple.  Skin:    General: Skin is warm and dry.  Neurological:     General: No focal deficit present.     Mental Status: She is alert and oriented to person, place, and time.  Psychiatric:        Mood and Affect: Mood normal.        Behavior: Behavior normal.        Thought Content: Thought content normal.           Assessment & Plan:  COVID- thankfully pt is testing negative today.  She was very concerned about the overlapping symptoms of COVID and seasonal allergies.  Test is negative for COVID so she will focus on treating allergy sxs.

## 2023-01-29 NOTE — Patient Instructions (Signed)
Follow up as needed or as scheduled Thankfully you are testing negative You are ok to rejoin the rest of the planet! Continue to take Claritin or Zyrtec daily- you can add Flonase if needed Drink lots of fluids Call with any questions or concerns [place]!!!

## 2023-01-30 ENCOUNTER — Ambulatory Visit (INDEPENDENT_AMBULATORY_CARE_PROVIDER_SITE_OTHER): Payer: Medicare PPO | Admitting: Family Medicine

## 2023-01-30 ENCOUNTER — Encounter (INDEPENDENT_AMBULATORY_CARE_PROVIDER_SITE_OTHER): Payer: Self-pay | Admitting: Family Medicine

## 2023-01-30 VITALS — BP 119/78 | HR 68 | Temp 98.1°F | Ht 63.0 in | Wt 196.0 lb

## 2023-01-30 DIAGNOSIS — E559 Vitamin D deficiency, unspecified: Secondary | ICD-10-CM | POA: Diagnosis not present

## 2023-01-30 DIAGNOSIS — E538 Deficiency of other specified B group vitamins: Secondary | ICD-10-CM | POA: Diagnosis not present

## 2023-01-30 DIAGNOSIS — Z6834 Body mass index (BMI) 34.0-34.9, adult: Secondary | ICD-10-CM

## 2023-01-30 DIAGNOSIS — R7303 Prediabetes: Secondary | ICD-10-CM | POA: Diagnosis not present

## 2023-01-30 DIAGNOSIS — E88819 Insulin resistance, unspecified: Secondary | ICD-10-CM

## 2023-01-30 MED ORDER — VITAMIN D (ERGOCALCIFEROL) 1.25 MG (50000 UNIT) PO CAPS: 50000.0000 [IU] | ORAL_CAPSULE | ORAL | 0 refills | Status: DC

## 2023-01-30 NOTE — Assessment & Plan Note (Signed)
Last fasting insulin level high 12/21 at 17.9.  she was having a lot of hyperphagia and sugar cravings but has done fairly well with dietary changes and weight loss.    Plan: recheck fasting insulin level today.  Consider use of metformin.  Increase exercise time to 20 min 3+ days/ wk.

## 2023-01-30 NOTE — Assessment & Plan Note (Signed)
Lab Results  Component Value Date   HGBA1C 5.8 (H) 10/12/2022   She has done well with dietary changes in the past 3.5 mos and is down 11.7% TBW.  She is not on metformin.   Plan: continue prescribed dietary plan.  Limit intake of sweets.  Recheck A1c today.

## 2023-01-30 NOTE — Progress Notes (Signed)
Office: [phone]  /  Fax: [phone]  WEIGHT SUMMARY AND BIOMETRICS  Starting Date: 10/12/22  Starting Weight: 222lb   Weight Lost Since Last Visit: 1lb   Vitals Temp: 98.1 F (36.7 C) BP: 119/78 Pulse Rate: 68 SpO2: 97 %   Body Composition  Body Fat %: 46.4 % Fat Mass (lbs): 91.2 lbs Muscle Mass (lbs): 99.8 lbs Total Body Water (lbs): 74.8 lbs Visceral Fat Rating : 15   HPI  Chief Complaint: OBESITY  Kristine Olson is here to discuss her progress with her obesity treatment plan. She is on the the Category 1 Plan and states she is following her eating plan approximately 60 % of the time. She states she is exercising 0 minutes 0 times per week.   Interval History:  Since last office visit she is down 1 lb Her net weight loss is 26 lb in 3 mos which is an 11.7% TBW loss She was sick with COVID  She is not doing any exercise but has an exercise bike.  She has not renewed her gym membership She does feel adequately full but would like more variety  Breakfast handout given Joint pain is improving  Pharmacotherapy: none  PHYSICAL EXAM:  Blood pressure 119/78, pulse 68, temperature 98.1 F (36.7 C), height 5\' 3"  (1.6 m), weight 196 lb (88.9 kg), SpO2 97 %. Body mass index is 34.72 kg/m.  General: She is overweight, cooperative, alert, well developed, and in no acute distress. PSYCH: Has normal mood, affect and thought process.   Lungs: Normal breathing effort, no conversational dyspnea.   ASSESSMENT AND PLAN  TREATMENT PLAN FOR OBESITY:  Recommended Dietary Goals  [name] is currently in the action stage of change. As such, her goal is to continue weight management plan. She has agreed to the Category 1 Plan.  Behavioral Intervention  We discussed the following Behavioral Modification Strategies today: increasing lean protein intake, increasing vegetables, increasing lower glycemic fruits, avoiding skipping meals, increasing water intake, work on  meal planning and preparation, and better snacking choices.  Additional resources provided today: NA  Recommended Physical Activity Goals  [name] has been advised to work up to 150 minutes of moderate intensity aerobic activity a week and strengthening exercises 2-3 times per week for cardiovascular health, weight loss maintenance and preservation of muscle mass.   She has agreed to Think about ways to increase physical activity  Pharmacotherapy changes for the treatment of obesity: none  ASSOCIATED CONDITIONS ADDRESSED TODAY  Prediabetes Assessment & Plan: Lab Results  Component Value Date   HGBA1C 5.8 (H) 10/12/2022   She has done well with dietary changes in the past 3.5 mos and is down 11.7% TBW.  She is not on metformin.   Plan: continue prescribed dietary plan.  Limit intake of sweets.  Recheck A1c today.  Orders: -     Hemoglobin A1c  Vitamin D deficiency Assessment & Plan: Last vitamin D Lab Results  Component Value Date   VD25OH 26.8 (L) 10/12/2022   She has been taking RX vitamin D 50,0000 IU weekly.  Target range 50-70.  Energy level is improving.  Denies GI side effects.  Recheck Vitamin D level today.  Orders: -     Vitamin D (Ergocalciferol); Take 1 capsule (50,000 Units total) by mouth every 7 (seven) days.  Dispense: 5 capsule; Refill: 0 -     VITAMIN D 25 Hydroxy (Vit-D Deficiency, Fractures)  Insulin resistance Assessment & Plan: Last fasting insulin level high 12/21 at 17.9.  she was having a lot of hyperphagia and sugar cravings but has done fairly well with dietary changes and weight loss.    Plan: recheck fasting insulin level today.  Consider use of metformin.  Increase exercise time to 20 min 3+ days/ wk.    Orders: -     Insulin, random  B12 deficiency Assessment & Plan: Last B12 level 12/21 230.  Has been taking Vitamin B12 500 mcg daily > 3 mos.  Energy level is improving.  Denies paraesthesias.  Recheck B12 level  today.  Orders: -     Vitamin B12  Morbid obesity  BMI 34.0-34.9,adult      She was informed of the importance of frequent follow up visits to maximize her success with intensive lifestyle modifications for her multiple health conditions.   ATTESTASTION STATEMENTS:  Reviewed by clinician on day of visit: allergies, medications, problem list, medical history, surgical history, family history, social history, and previous encounter notes pertinent to obesity diagnosis.   I have personally spent 30 minutes total time today in preparation, patient care, nutritional counseling and documentation for this visit, including the following: review of clinical lab tests; review of medical tests/procedures/services.      Glennis Brink, DO DABFM, DABOM [facility] [address] [place], [place] [address] [phone]

## 2023-01-30 NOTE — Assessment & Plan Note (Signed)
Last vitamin D Lab Results  Component Value Date   VD25OH 26.8 (L) 10/12/2022   She has been taking RX vitamin D 50,0000 IU weekly.  Target range 50-70.  Energy level is improving.  Denies GI side effects.  Recheck Vitamin D level today.

## 2023-01-30 NOTE — Assessment & Plan Note (Signed)
Last B12 level 12/21 230.  Has been taking Vitamin B12 500 mcg daily > 3 mos.  Energy level is improving.  Denies paraesthesias.  Recheck B12 level today.

## 2023-01-31 ENCOUNTER — Telehealth: Payer: Self-pay | Admitting: Family Medicine

## 2023-01-31 DIAGNOSIS — M8580 Other specified disorders of bone density and structure, unspecified site: Secondary | ICD-10-CM

## 2023-01-31 LAB — VITAMIN B12: Vitamin B-12: 634 pg/mL (ref 232–1245)

## 2023-01-31 LAB — HEMOGLOBIN A1C
Est. average glucose Bld gHb Est-mCnc: 120 mg/dL
Hgb A1c MFr Bld: 5.8 % — ABNORMAL HIGH (ref 4.8–5.6)

## 2023-01-31 LAB — VITAMIN D 25 HYDROXY (VIT D DEFICIENCY, FRACTURES): Vit D, 25-Hydroxy: 38.9 ng/mL (ref 30.0–100.0)

## 2023-01-31 LAB — INSULIN, RANDOM: INSULIN: 14.1 u[IU]/mL (ref 2.6–24.9)

## 2023-01-31 NOTE — Telephone Encounter (Signed)
We place referral ?

## 2023-01-31 NOTE — Telephone Encounter (Signed)
Caller name: CHANTELE CAYANAN  On DPR?: Yes  Call back number: [phone] (mobile)  Provider they see: Sheliah Hatch, MD  Reason for call: Patient calling get a referral for a bone density scan. Patient states that she want to be referral someone in [employer]. Patient has a follow up Dr.Tabori on 02/07/23. Patient wants to know if she could get this scan done before her appt.

## 2023-02-01 NOTE — Telephone Encounter (Signed)
Called and LM to call back, was trying to inform her that it has been ordered

## 2023-02-01 NOTE — Telephone Encounter (Signed)
I ordered a bone density for her to be done at the [place] office.  I doubt they will be able to do this prior to her upcoming appt (since it is next week) but we can try.

## 2023-02-07 ENCOUNTER — Ambulatory Visit (INDEPENDENT_AMBULATORY_CARE_PROVIDER_SITE_OTHER): Payer: Medicare PPO | Admitting: Family Medicine

## 2023-02-07 ENCOUNTER — Encounter: Payer: Self-pay | Admitting: Family Medicine

## 2023-02-07 VITALS — BP 116/66 | HR 80 | Temp 97.9°F | Resp 17 | Ht 63.0 in | Wt 199.5 lb

## 2023-02-07 DIAGNOSIS — I10 Essential (primary) hypertension: Secondary | ICD-10-CM | POA: Diagnosis not present

## 2023-02-07 DIAGNOSIS — E559 Vitamin D deficiency, unspecified: Secondary | ICD-10-CM

## 2023-02-07 DIAGNOSIS — Z0000 Encounter for general adult medical examination without abnormal findings: Secondary | ICD-10-CM

## 2023-02-07 LAB — CBC WITH DIFFERENTIAL/PLATELET
Basophils Absolute: 0 10*3/uL (ref 0.0–0.1)
Basophils Relative: 0.7 % (ref 0.0–3.0)
Eosinophils Absolute: 0.4 10*3/uL (ref 0.0–0.7)
Eosinophils Relative: 5 % (ref 0.0–5.0)
HCT: 37.2 % (ref 36.0–46.0)
Hemoglobin: 12 g/dL (ref 12.0–15.0)
Lymphocytes Relative: 17.3 % (ref 12.0–46.0)
Lymphs Abs: 1.3 10*3/uL (ref 0.7–4.0)
MCHC: 32.3 g/dL (ref 30.0–36.0)
MCV: 82.6 fl (ref 78.0–100.0)
Monocytes Absolute: 0.5 10*3/uL (ref 0.1–1.0)
Monocytes Relative: 6.7 % (ref 3.0–12.0)
Neutro Abs: 5.1 10*3/uL (ref 1.4–7.7)
Neutrophils Relative %: 70.3 % (ref 43.0–77.0)
Platelets: 200 10*3/uL (ref 150.0–400.0)
RBC: 4.51 Mil/uL (ref 3.87–5.11)
RDW: 17.8 % — ABNORMAL HIGH (ref 11.5–15.5)
WBC: 7.2 10*3/uL (ref 4.0–10.5)

## 2023-02-07 LAB — BASIC METABOLIC PANEL
BUN: 21 mg/dL (ref 6–23)
CO2: 29 mEq/L (ref 19–32)
Calcium: 9.4 mg/dL (ref 8.4–10.5)
Chloride: 102 mEq/L (ref 96–112)
Creatinine, Ser: 1.13 mg/dL (ref 0.40–1.20)
GFR: 47.88 mL/min — ABNORMAL LOW (ref >60.00)
Glucose, Bld: 129 mg/dL — ABNORMAL HIGH (ref 70–99)
Potassium: 4.4 mEq/L (ref 3.5–5.1)
Sodium: 140 mEq/L (ref 135–145)

## 2023-02-07 LAB — LIPID PANEL
Cholesterol: 179 mg/dL (ref 0–200)
HDL: 58.1 mg/dL (ref >39.00)
LDL Cholesterol: 104 mg/dL — ABNORMAL HIGH (ref 0–99)
NonHDL: 121.29
Total CHOL/HDL Ratio: 3
Triglycerides: 85 mg/dL (ref 0.0–149.0)
VLDL: 17 mg/dL (ref 0.0–40.0)

## 2023-02-07 LAB — HEPATIC FUNCTION PANEL
ALT: 11 U/L (ref 0–35)
AST: 15 U/L (ref 0–37)
Albumin: 4.1 g/dL (ref 3.5–5.2)
Alkaline Phosphatase: 78 U/L (ref 39–117)
Bilirubin, Direct: 0.1 mg/dL (ref 0.0–0.3)
Total Bilirubin: 0.4 mg/dL (ref 0.2–1.2)
Total Protein: 7.4 g/dL (ref 6.0–8.3)

## 2023-02-07 LAB — TSH: TSH: 0.11 u[IU]/mL — ABNORMAL LOW (ref 0.35–5.50)

## 2023-02-07 NOTE — Patient Instructions (Signed)
Follow up in 6 months to recheck BP and cholesterol We'll notify you of your lab results and make any changes if needed Continue to work on healthy diet and regular exercise- you look great!! Call with any questions or concerns Stay Safe!  Stay Healthy! Happy Spring!!!

## 2023-02-07 NOTE — Assessment & Plan Note (Signed)
Labs recently checked by Bariatrics.  No need to repeat

## 2023-02-07 NOTE — Assessment & Plan Note (Signed)
Pt's PE WNL w/ exception of BMI.  UTD on mammo, colonoscopy, PNA.  DEXA entered.  Check labs.  Anticipatory guidance provided.

## 2023-02-07 NOTE — Progress Notes (Signed)
   Subjective:    Patient ID: Kristine Olson, female    DOB: 06-Aug-1946, 77 y.o.   MRN: [id number]  HPI CPE- UTD on mammo, colonoscopy, PNA.  Patient Care Team    Relationship Specialty Notifications Start End  Sheliah Hatch, MD PCP - General Family Medicine  03/02/16   Peggye Fothergill, MD Referring Physician Obstetrics and Gynecology  04/17/16   Iva Boop, MD Consulting Physician Gastroenterology  04/17/16   Jerene Dilling  Psychiatry  04/20/17   Ollen Gross, MD Consulting Physician Orthopedic Surgery  04/20/17   Waymon Budge, MD Consulting Physician Pulmonary Disease  04/20/17   Rollene Rotunda, MD Consulting Physician Cardiology  04/24/18   Tat, Octaviano Batty, DO Consulting Physician Neurology  02/15/21      Health Maintenance  Topic Date Due   DEXA SCAN  08/30/2018   MAMMOGRAM  02/23/2023   INFLUENZA VACCINE  05/24/2023   Medicare Annual Wellness (AWV)  01/25/2024   COLONOSCOPY (Pts 45-30yrs Insurance coverage will need to be confirmed)  03/05/2025   Pneumonia Vaccine 62+ Years old  Completed   Hepatitis C Screening  Completed   HPV VACCINES  Aged Out   DTaP/Tdap/Td  Discontinued   COVID-19 Vaccine  Discontinued   Zoster Vaccines- Shingrix  Discontinued      Review of Systems Patient reports no vision/ hearing changes, adenopathy, fever, weight change,  persistant/recurrent hoarseness , swallowing issues, chest pain, palpitations, edema, persistant/recurrent cough, hemoptysis, dyspnea (rest/exertional/paroxysmal nocturnal), gastrointestinal bleeding (melena, rectal bleeding), abdominal pain, significant heartburn, bowel changes, GU symptoms (dysuria, hematuria, incontinence), Gyn symptoms (abnormal  bleeding, pain),  syncope, focal weakness, memory loss, numbness & tingling, skin/hair/nail changes, abnormal bruising or bleeding, anxiety, or depression.     Objective:   Physical Exam General Appearance:    Alert, cooperative, no distress, appears stated age  Head:     Normocephalic, without obvious abnormality, atraumatic  Eyes:    PERRL, conjunctiva/corneas clear, EOM's intact both eyes  Ears:    Normal TM's and external ear canals, both ears  Nose:   Nares normal, septum midline, mucosa normal, no drainage    or sinus tenderness  Throat:   Lips, mucosa, and tongue normal; teeth and gums normal  Neck:   Supple, symmetrical, trachea midline, no adenopathy;    Thyroid: no enlargement/tenderness/nodules  Back:     Symmetric, no curvature, ROM normal, no CVA tenderness  Lungs:     Clear to auscultation bilaterally, respirations unlabored  Chest Wall:    No tenderness or deformity   Heart:    Regular rate and rhythm, S1 and S2 normal, no murmur, rub   or gallop  Breast Exam:    Deferred to GYN  Abdomen:     Soft, non-tender, bowel sounds active all four quadrants,    no masses, no organomegaly  Genitalia:    Deferred to GYN  Rectal:    Extremities:   Extremities normal, atraumatic, no cyanosis or edema  Pulses:   2+ and symmetric all extremities  Skin:   Skin color, texture, turgor normal, no rashes or lesions  Lymph nodes:   Cervical, supraclavicular, and axillary nodes normal  Neurologic:   CNII-XII intact, normal strength, sensation and reflexes    throughout          Assessment & Plan:

## 2023-02-07 NOTE — Assessment & Plan Note (Signed)
Chronic problem.  Excellent control.  No med changes at this time 

## 2023-02-08 ENCOUNTER — Other Ambulatory Visit (INDEPENDENT_AMBULATORY_CARE_PROVIDER_SITE_OTHER): Payer: Medicare PPO

## 2023-02-08 ENCOUNTER — Telehealth: Payer: Self-pay

## 2023-02-08 ENCOUNTER — Other Ambulatory Visit: Payer: Self-pay

## 2023-02-08 DIAGNOSIS — E039 Hypothyroidism, unspecified: Secondary | ICD-10-CM | POA: Diagnosis not present

## 2023-02-08 LAB — T4, FREE: Free T4: 1.19 ng/dL (ref 0.60–1.60)

## 2023-02-08 NOTE — Telephone Encounter (Signed)
Pt seen results Via my chart  T3 and T4 has been added and faxed to lab

## 2023-02-08 NOTE — Telephone Encounter (Signed)
-----   Message from Sheliah Hatch, MD sent at 02/08/2023  7:28 AM EDT ----- Your TSH is slightly low.  We are going to add a free T3 and free T4 to assess whether we need to adjust your Levothyroxine (dx hypothyroid).  Remainder of labs are stable and look good!

## 2023-02-09 LAB — T3: T3, Total: 91 ng/dL (ref 76–181)

## 2023-02-13 ENCOUNTER — Ambulatory Visit (INDEPENDENT_AMBULATORY_CARE_PROVIDER_SITE_OTHER)
Admission: RE | Admit: 2023-02-13 | Discharge: 2023-02-13 | Disposition: A | Discharge status: Home / Self Care | Payer: Medicare PPO | Source: Ambulatory Visit | Attending: Family Medicine | Admitting: Family Medicine

## 2023-02-13 DIAGNOSIS — M8580 Other specified disorders of bone density and structure, unspecified site: Secondary | ICD-10-CM | POA: Diagnosis not present

## 2023-02-13 DIAGNOSIS — M8588 Other specified disorders of bone density and structure, other site: Secondary | ICD-10-CM | POA: Diagnosis not present

## 2023-02-14 ENCOUNTER — Other Ambulatory Visit: Payer: Medicare PPO

## 2023-02-26 ENCOUNTER — Ambulatory Visit (INDEPENDENT_AMBULATORY_CARE_PROVIDER_SITE_OTHER): Payer: Medicare PPO | Admitting: Family Medicine

## 2023-02-26 ENCOUNTER — Encounter (INDEPENDENT_AMBULATORY_CARE_PROVIDER_SITE_OTHER): Payer: Self-pay | Admitting: Family Medicine

## 2023-02-26 VITALS — BP 113/74 | HR 60 | Temp 98.0°F | Ht 63.0 in | Wt 195.0 lb

## 2023-02-26 DIAGNOSIS — Z6834 Body mass index (BMI) 34.0-34.9, adult: Secondary | ICD-10-CM | POA: Diagnosis not present

## 2023-02-26 DIAGNOSIS — R7303 Prediabetes: Secondary | ICD-10-CM | POA: Diagnosis not present

## 2023-02-26 DIAGNOSIS — E559 Vitamin D deficiency, unspecified: Secondary | ICD-10-CM

## 2023-02-26 NOTE — Progress Notes (Signed)
Office: [phone]  /  Fax: [phone]  WEIGHT SUMMARY AND BIOMETRICS  Starting Date: 10/12/22  Starting Weight: 222lb   Weight Lost Since Last Visit: 1lb   Vitals Temp: 98 F (36.7 C) BP: 113/74 Pulse Rate: 60 SpO2: 99 %   Body Composition  Body Fat %: 46.1 % Fat Mass (lbs): 90 lbs Muscle Mass (lbs): 100 lbs Total Body Water (lbs): 74.8 lbs Visceral Fat Rating : 15     HPI  Chief Complaint: OBESITY  Kristine Olson is here to discuss her progress with her obesity treatment plan. She is on the the Category 1 Plan and states she is following her eating plan approximately 60 % of the time. She states she is exercising 30 minutes 2 times per week.   Interval History:  Since last office visit she is down 1 lb She occasionally skips breakfast and staying up late She is snacking on popcorn at night She is down 27 lb since her first visit 6 mos ago She did walk more while on vacation and was wearing her smartwatch Her husband is supportive She has never liked to exercise She would like to lose additional weight and has hit a plateau in weight loss.   Pharmacotherapy: None  PHYSICAL EXAM:  Blood pressure 113/74, pulse 60, temperature 98 F (36.7 C), height 5\' 3"  (1.6 m), weight 195 lb (88.5 kg), SpO2 99 %. Body mass index is 34.54 kg/m.  General: She is overweight, cooperative, alert, well developed, and in no acute distress. PSYCH: Has normal mood, affect and thought process.   Lungs: Normal breathing effort, no conversational dyspnea.   ASSESSMENT AND PLAN  TREATMENT PLAN FOR OBESITY:  Recommended Dietary Goals  Kristine Olson is currently in the action stage of change. As such, her goal is to continue weight management plan. She has agreed to the Category 1 Plan.  Behavioral Intervention  We discussed the following Behavioral Modification Strategies today: increasing lean protein intake, decreasing simple carbohydrates , increasing vegetables, increasing  lower glycemic fruits, increasing fiber rich foods, avoiding skipping meals, increasing water intake, work on managing stress, creating time for self-care and relaxation measures, avoiding temptations and identifying enticing environmental cues, continue to practice mindfulness when eating, and planning for success.  Additional resources provided today: NA  Recommended Physical Activity Goals  Kristine Olson has been advised to work up to 150 minutes of moderate intensity aerobic activity a week and strengthening exercises 2-3 times per week for cardiovascular health, weight loss maintenance and preservation of muscle mass.   She has agreed to Think about ways to increase physical activity  Pharmacotherapy changes for the treatment of obesity: None   ASSOCIATED CONDITIONS ADDRESSED TODAY  Vitamin D deficiency Assessment & Plan: Last vitamin D Lab Results  Component Value Date   VD25OH 38.9 01/30/2023   She is doing well on prescription vitamin D 50,000 IU once weekly.  Her vitamin D level did improve on last check from 26.8-38.9.  We discussed a target vitamin D level 50-70.  She is up-to-date with her bone density testing.  Will continue prescription vitamin D 50,000 IU once weekly.  Recheck level in July.   BMI 34.0-34.9,adult  Morbid obesity with BMI 39 (+HTN, OSA, GERD) Assessment & Plan: Reviewed overall progress with patient.  She has successfully lost 27 pounds in the past 6 months of medically supervised weight management.  She is still losing body fat and gaining muscle mass.  She is working on practicing mindful eating, stress reduction and eating  on a schedule.  She still seems to be lacking adequate protein intake which is causing more nighttime snacking.  We discussed adding more protein and fiber with meals and made suggestions for improved snack choices at night.  She has room for improvement with regular exercise.  We discussed the option for [facility] [employer] [facility].  Continue to work on approximately 1200 kcal/day diet including 80 g of dietary protein intake daily.  Will aim for 30 to 45 minutes of exercise at least 3 days a week.   Prediabetes Assessment & Plan: Lab Results  Component Value Date   HGBA1C 5.8 (H) 01/30/2023   Her A1c remains in the prediabetic range at 5.8.  Reviewed labs from last visit.  Her fasting insulin did improve from 17.9-14.1.  She is doing a good job of reducing her intake of added sugar and refined carbohydrates but did backslide after a recent vacation.  She has room for improvement with regular exercise.  She seems to be under eating lean protein and fiber, sometimes consuming only 2 meals a day which is leading to increased snacking at night.  Increase dietary protein intake to a total of 80 g/day.  She may add in more nonstarchy vegetables and up to 2 fruit servings per day.  Work on increasing walking time or water aerobics for insulin sensitivity.  Plan to recheck A1c in the next 5 months.       She was informed of the importance of frequent follow up visits to maximize her success with intensive lifestyle modifications for her multiple health conditions.   ATTESTASTION STATEMENTS:  Reviewed by clinician on day of visit: allergies, medications, problem list, medical history, surgical history, family history, social history, and previous encounter notes pertinent to obesity diagnosis.   I have personally spent 30 minutes total time today in preparation, patient care, nutritional counseling and documentation for this visit, including the following: review of clinical lab tests; review of medical tests/procedures/services.      Glennis Brink, DO DABFM, DABOM [facility] [address] [place], [place] [address] [phone]

## 2023-02-26 NOTE — Assessment & Plan Note (Signed)
Reviewed overall progress with patient.  She has successfully lost 27 pounds in the past 6 months of medically supervised weight management.  She is still losing body fat and gaining muscle mass.  She is working on practicing mindful eating, stress reduction and eating on a schedule.  She still seems to be lacking adequate protein intake which is causing more nighttime snacking.  We discussed adding more protein and fiber with meals and made suggestions for improved snack choices at night.  She has room for improvement with regular exercise.  We discussed the option for [facility] [employer] [facility].  Continue to work on approximately 1200 kcal/day diet including 80 g of dietary protein intake daily.  Will aim for 30 to 45 minutes of exercise at least 3 days a week.

## 2023-02-26 NOTE — Assessment & Plan Note (Signed)
Last vitamin D Lab Results  Component Value Date   VD25OH 38.9 01/30/2023   She is doing well on prescription vitamin D 50,000 IU once weekly.  Her vitamin D level did improve on last check from 26.8-38.9.  We discussed a target vitamin D level 50-70.  She is up-to-date with her bone density testing.  Will continue prescription vitamin D 50,000 IU once weekly.  Recheck level in July.

## 2023-02-26 NOTE — Assessment & Plan Note (Signed)
Lab Results  Component Value Date   HGBA1C 5.8 (H) 01/30/2023   Her A1c remains in the prediabetic range at 5.8.  Reviewed labs from last visit.  Her fasting insulin did improve from 17.9-14.1.  She is doing a good job of reducing her intake of added sugar and refined carbohydrates but did backslide after a recent vacation.  She has room for improvement with regular exercise.  She seems to be under eating lean protein and fiber, sometimes consuming only 2 meals a day which is leading to increased snacking at night.  Increase dietary protein intake to a total of 80 g/day.  She may add in more nonstarchy vegetables and up to 2 fruit servings per day.  Work on increasing walking time or water aerobics for insulin sensitivity.  Plan to recheck A1c in the next 5 months.

## 2023-02-27 DIAGNOSIS — Z1231 Encounter for screening mammogram for malignant neoplasm of breast: Secondary | ICD-10-CM | POA: Diagnosis not present

## 2023-02-27 DIAGNOSIS — R92323 Mammographic fibroglandular density, bilateral breasts: Secondary | ICD-10-CM | POA: Diagnosis not present

## 2023-02-28 ENCOUNTER — Encounter: Payer: Self-pay | Admitting: Family Medicine

## 2023-02-28 LAB — HM MAMMOGRAPHY

## 2023-03-15 ENCOUNTER — Telehealth (INDEPENDENT_AMBULATORY_CARE_PROVIDER_SITE_OTHER): Payer: Self-pay | Admitting: Family Medicine

## 2023-03-15 NOTE — Telephone Encounter (Signed)
Pt called 03/15/23 need refill on vitamin D

## 2023-03-20 NOTE — Telephone Encounter (Signed)
Phone call to patient to let her know per our office policy we do not refill in between visit. Patient has a upcoming appointment in June and we will refill the vitamin D then.

## 2023-03-21 DIAGNOSIS — F411 Generalized anxiety disorder: Secondary | ICD-10-CM | POA: Diagnosis not present

## 2023-04-03 ENCOUNTER — Ambulatory Visit (INDEPENDENT_AMBULATORY_CARE_PROVIDER_SITE_OTHER): Payer: Medicare PPO | Admitting: Family Medicine

## 2023-04-03 ENCOUNTER — Encounter (INDEPENDENT_AMBULATORY_CARE_PROVIDER_SITE_OTHER): Payer: Self-pay | Admitting: Family Medicine

## 2023-04-03 VITALS — BP 125/80 | HR 68 | Temp 97.7°F | Ht 63.0 in | Wt 195.0 lb

## 2023-04-03 DIAGNOSIS — E559 Vitamin D deficiency, unspecified: Secondary | ICD-10-CM

## 2023-04-03 DIAGNOSIS — Z6834 Body mass index (BMI) 34.0-34.9, adult: Secondary | ICD-10-CM | POA: Diagnosis not present

## 2023-04-03 MED ORDER — VITAMIN D (ERGOCALCIFEROL) 1.25 MG (50000 UNIT) PO CAPS
50000.0000 [IU] | ORAL_CAPSULE | ORAL | 0 refills | Status: DC
Start: 2023-04-03 — End: 2023-05-22

## 2023-04-03 NOTE — Assessment & Plan Note (Signed)
Reviewed bioimpedence results She is still losing body fat and gaining muscle mass despite being off plan with social outings She has only recently started her workouts at [facility] and remains motivated to continue She has talked to her spouse about not bringing junk food into the house She plans to start knitting at night to keep herself from boredom snacking  We discussed logging daily intake allowing 1200 cal/ day Eat 3 meals.  The lack of breakfast is leading to oversnacking at night By logging, she can allow a roll with dinner when eating out if she chooses.

## 2023-04-03 NOTE — Assessment & Plan Note (Signed)
Last vitamin D Lab Results  Component Value Date   VD25OH 38.9 01/30/2023   She has been on RX vitamin D 50,000 IU weekly Denies adverse SE Energy level is improving  Refilled RX vitamin D 50,000 IU weekly and recheck level in 2 mos

## 2023-04-03 NOTE — Progress Notes (Signed)
Office: [phone]  /  Fax: [phone]  WEIGHT SUMMARY AND BIOMETRICS  Starting Date: 10/12/22  Starting Weight: 222lb   Weight Lost Since Last Visit: 0   Vitals Temp: 97.7 F (36.5 C) BP: 125/80 Pulse Rate: 68 SpO2: 99 %   Body Composition  Body Fat %: 45.5 % Fat Mass (lbs): 89 lbs Muscle Mass (lbs): 101 lbs Total Body Water (lbs): 73.6 lbs Visceral Fat Rating : 14   HPI  Chief Complaint: OBESITY  April is here to discuss her progress with her obesity treatment plan. She is on the the Category 1 Plan and states she is following her eating plan approximately 60 % of the time. She states she is exercising 30-60 minutes 2 times per week.  Interval History:  Since last office visit she is down 0 lb She has a net weight loss of 27 lb in the past 7 mos She would like to lose 30 more pounds She has had travel, social outings and has splurged more on bread and sweets Her husband is bringing her some junk food She tends to snack at night when bored or stressed She did join [facility]-- doing a 9 week program  Pharmacotherapy: none  PHYSICAL EXAM:  Blood pressure 125/80, pulse 68, temperature 97.7 F (36.5 C), height 5\' 3"  (1.6 m), weight 195 lb (88.5 kg), SpO2 99 %. Body mass index is 34.54 kg/m.  General: She is overweight, cooperative, alert, well developed, and in no acute distress. PSYCH: Has normal mood, affect and thought process.   Lungs: Normal breathing effort, no conversational dyspnea.   ASSESSMENT AND PLAN  TREATMENT PLAN FOR OBESITY:  Recommended Dietary Goals  Jernei is currently in the action stage of change. As such, her goal is to continue weight management plan. She has agreed to the Category 1 Plan. + 200 cal   Behavioral Intervention  We discussed the following Behavioral Modification Strategies today: increasing lean protein intake, decreasing simple carbohydrates , increasing vegetables, increasing lower glycemic fruits,  avoiding skipping meals, increasing water intake, work on meal planning and preparation, continue to practice mindfulness when eating, and planning for success.  Additional resources provided today: NA  Recommended Physical Activity Goals  Barrie has been advised to work up to 150 minutes of moderate intensity aerobic activity a week and strengthening exercises 2-3 times per week for cardiovascular health, weight loss maintenance and preservation of muscle mass.   She has agreed to [employer] strengthening exercises with a goal of 2-3 sessions a week   Pharmacotherapy changes for the treatment of obesity: none  ASSOCIATED CONDITIONS ADDRESSED TODAY  Vitamin D deficiency Assessment & Plan: Last vitamin D Lab Results  Component Value Date   VD25OH 38.9 01/30/2023   She has been on RX vitamin D 50,000 IU weekly Denies adverse SE Energy level is improving  Refilled RX vitamin D 50,000 IU weekly and recheck level in 2 mos  Orders: -     Vitamin D (Ergocalciferol); Take 1 capsule (50,000 Units total) by mouth every 7 (seven) days.  Dispense: 5 capsule; Refill: 0  Morbid obesity with BMI 39 (+HTN, OSA, GERD) Assessment & Plan: Reviewed bioimpedence results She is still losing body fat and gaining muscle mass despite being off plan with social outings She has only recently started her workouts at [facility] and remains motivated to continue She has talked to her spouse about not bringing junk food into the house She plans to start knitting at night to keep herself  from boredom snacking  We discussed logging daily intake allowing 1200 cal/ day Eat 3 meals.  The lack of breakfast is leading to oversnacking at night By logging, she can allow a roll with dinner when eating out if she chooses.   BMI 34.0-34.9,adult      She was informed of the importance of frequent follow up visits to maximize her success with intensive lifestyle modifications for her multiple health  conditions.   ATTESTASTION STATEMENTS:  Reviewed by clinician on day of visit: allergies, medications, problem list, medical history, surgical history, family history, social history, and previous encounter notes pertinent to obesity diagnosis.   I have personally spent 30 minutes total time today in preparation, patient care, nutritional counseling and documentation for this visit, including the following: review of clinical lab tests; review of medical tests/procedures/services.      Glennis Brink, DO DABFM, DABOM [facility] [address] [place], [place] [address] [phone]

## 2023-04-05 DIAGNOSIS — H35363 Drusen (degenerative) of macula, bilateral: Secondary | ICD-10-CM | POA: Diagnosis not present

## 2023-04-05 DIAGNOSIS — H524 Presbyopia: Secondary | ICD-10-CM | POA: Diagnosis not present

## 2023-04-05 DIAGNOSIS — H2513 Age-related nuclear cataract, bilateral: Secondary | ICD-10-CM | POA: Diagnosis not present

## 2023-04-05 DIAGNOSIS — H52223 Regular astigmatism, bilateral: Secondary | ICD-10-CM | POA: Diagnosis not present

## 2023-04-12 ENCOUNTER — Other Ambulatory Visit: Payer: Self-pay | Admitting: Family Medicine

## 2023-04-16 DIAGNOSIS — F411 Generalized anxiety disorder: Secondary | ICD-10-CM | POA: Diagnosis not present

## 2023-05-03 ENCOUNTER — Ambulatory Visit (INDEPENDENT_AMBULATORY_CARE_PROVIDER_SITE_OTHER): Payer: Medicare PPO | Admitting: Family Medicine

## 2023-05-07 ENCOUNTER — Ambulatory Visit (INDEPENDENT_AMBULATORY_CARE_PROVIDER_SITE_OTHER): Payer: Medicare PPO | Admitting: Family Medicine

## 2023-05-16 DIAGNOSIS — F411 Generalized anxiety disorder: Secondary | ICD-10-CM | POA: Diagnosis not present

## 2023-05-18 ENCOUNTER — Other Ambulatory Visit: Payer: Self-pay | Admitting: Family Medicine

## 2023-05-20 ENCOUNTER — Other Ambulatory Visit: Payer: Self-pay | Admitting: Family Medicine

## 2023-05-22 ENCOUNTER — Encounter (INDEPENDENT_AMBULATORY_CARE_PROVIDER_SITE_OTHER): Payer: Self-pay | Admitting: Adult Health

## 2023-05-22 ENCOUNTER — Ambulatory Visit (INDEPENDENT_AMBULATORY_CARE_PROVIDER_SITE_OTHER): Payer: Medicare PPO | Admitting: Adult Health

## 2023-05-22 VITALS — BP 109/73 | HR 56 | Temp 97.7°F | Ht 63.0 in | Wt 194.0 lb

## 2023-05-22 DIAGNOSIS — E559 Vitamin D deficiency, unspecified: Secondary | ICD-10-CM

## 2023-05-22 DIAGNOSIS — I10 Essential (primary) hypertension: Secondary | ICD-10-CM

## 2023-05-22 DIAGNOSIS — E669 Obesity, unspecified: Secondary | ICD-10-CM | POA: Diagnosis not present

## 2023-05-22 DIAGNOSIS — E88819 Insulin resistance, unspecified: Secondary | ICD-10-CM | POA: Diagnosis not present

## 2023-05-22 DIAGNOSIS — Z6838 Body mass index (BMI) 38.0-38.9, adult: Secondary | ICD-10-CM

## 2023-05-22 MED ORDER — VITAMIN D (ERGOCALCIFEROL) 1.25 MG (50000 UNIT) PO CAPS
50000.0000 [IU] | ORAL_CAPSULE | ORAL | 0 refills | Status: DC
Start: 2023-05-22 — End: 2023-06-05

## 2023-05-22 NOTE — Progress Notes (Signed)
WEIGHT SUMMARY AND BIOMETRICS  Vitals Temp: 97.7 F (36.5 C) BP: 109/73 Pulse Rate: (!) 56 SpO2: 96 %   Anthropometric Measurements Height: 5\' 3"  (1.6 m) Weight: 194 lb (88 kg) BMI (Calculated): 34.37 Weight at Last Visit: 195lb Weight Lost Since Last Visit: 1lb Weight Gained Since Last Visit: 0 Starting Weight: 222lb Total Weight Loss (lbs): 28 lb (12.7 kg)   Body Composition  Body Fat %: 44.7 % Fat Mass (lbs): 86.8 lbs Muscle Mass (lbs): 101.8 lbs Total Body Water (lbs): 74 lbs Visceral Fat Rating : 14   Other Clinical Data Fasting: yes Labs: no Today's Visit #: 8 Starting Date: 10/12/22    Chief Complaint:   OBESITY Kristine Olson is here to discuss her progress with her obesity treatment plan. She is on the the Category 1 Plan and states she is following her eating plan approximately 50 % of the time. She states she is exercising Gym 60 minutes 2 times per week.   Interim History:  Ms. Reynen recently started the Right Start Program at [facility]- great!  Ms. Byers and her extended family will travel to [place] and [place] this weekend for western vacation. Discussed travel strategies  Subjective:   1. Vitamin D deficiency  Latest Reference Range & Units 10/12/22 10:18 01/30/23 10:02  Vitamin D, 25-Hydroxy 30.0 - 100.0 ng/mL 26.8 (L) 38.9  (L): Data is abnormally low  She is on weekly Ergocalciferol- denies N/V/Muscle Weakness  2. Insulin resistance  Latest Reference Range & Units 10/12/22 10:18 01/30/23 10:02  INSULIN 2.6 - 24.9 uIU/mL 17.9 14.1   She denies polyphagia, and is not currently on any antidiabetic medications  3. Essential hypertension BP at goal at OV She is on Lopressor 25mg  1/2 tab BID- however she prefers to take 1 tab each morning, rather than splitting dose  Assessment/Plan:   1. Vitamin D deficiency Refill Vitamin D, Ergocalciferol, (DRISDOL) 1.25 MG (50000 UNIT) CAPS capsule Take 1 capsule (50,000 Units  total) by mouth every 7 (seven) days. Dispense: 5 capsule, Refills: 0 ordered   2. Insulin resistance Continue to reduce simple CHO/sugar and increase protein intake  3. Essential hypertension Continue antihypertensive therapy Limit Na+ intake  4. Obesity, Beginning BMI 38.79  Doshia is currently in the action stage of change. As such, her goal is to continue with weight loss efforts. She has agreed to the Category 1 Plan.   Exercise goals: Older adults should follow the adult guidelines. When older adults cannot meet the adult guidelines, they should be as physically active as their abilities and conditions will allow.  Older adults should do exercises that maintain or improve balance if they are at risk of falling.  Older adults should determine their level of effort for physical activity relative to their level of fitness.   Behavioral modification strategies: increasing lean protein intake, decreasing simple carbohydrates, increasing vegetables, increasing water intake, decreasing sodium intake, meal planning and cooking strategies, keeping healthy foods in the home, ways to avoid boredom eating, travel eating strategies, and planning for success.  Donnarae has agreed to follow-up with our clinic in 4 weeks. She was informed of the importance of frequent follow-up visits to maximize her success with intensive lifestyle modifications for her multiple health conditions.   Objective:   Blood pressure 109/73, pulse (!) 56, temperature 97.7 F (36.5 C), height 5\' 3"  (1.6 m), weight 194 lb (88 kg), SpO2 96%. Body mass index is 34.37 kg/m.  General: Cooperative, alert, well developed, in  no acute distress. HEENT: Conjunctivae and lids unremarkable. Cardiovascular: Regular rhythm.  Lungs: Normal work of breathing. Neurologic: No focal deficits.   Lab Results  Component Value Date   CREATININE 1.13 02/07/2023   BUN 21 02/07/2023   NA 140 02/07/2023   K 4.4 02/07/2023   CL 102  02/07/2023   CO2 29 02/07/2023   Lab Results  Component Value Date   ALT 11 02/07/2023   AST 15 02/07/2023   ALKPHOS 78 02/07/2023   BILITOT 0.4 02/07/2023   Lab Results  Component Value Date   HGBA1C 5.8 (H) 01/30/2023   HGBA1C 5.8 (H) 10/12/2022   HGBA1C 5.9 11/25/2014   HGBA1C 5.6 08/23/2012   Lab Results  Component Value Date   INSULIN 14.1 01/30/2023   INSULIN 17.9 10/12/2022   Lab Results  Component Value Date   TSH 0.11 (L) 02/07/2023   Lab Results  Component Value Date   CHOL 179 02/07/2023   HDL 58.10 02/07/2023   LDLCALC 104 (H) 02/07/2023   TRIG 85.0 02/07/2023   CHOLHDL 3 02/07/2023   Lab Results  Component Value Date   VD25OH 38.9 01/30/2023   VD25OH 26.8 (L) 10/12/2022   VD25OH 31.76 02/08/2022   Lab Results  Component Value Date   WBC 7.2 02/07/2023   HGB 12.0 02/07/2023   HCT 37.2 02/07/2023   MCV 82.6 02/07/2023   PLT 200.0 02/07/2023   Lab Results  Component Value Date   IRON 26 (L) 11/14/2021   TIBC 475 (H) 11/14/2021   FERRITIN 5 (L) 11/14/2021    Attestation Statements:   Reviewed by clinician on day of visit: allergies, medications, problem list, medical history, surgical history, family history, social history, and previous encounter notes.  I have reviewed the above documentation for accuracy and completeness, and I agree with the above. -  Linzee Depaul d. Zyia Kaneko, NP-C

## 2023-06-05 ENCOUNTER — Ambulatory Visit (INDEPENDENT_AMBULATORY_CARE_PROVIDER_SITE_OTHER): Payer: Medicare PPO | Admitting: Family Medicine

## 2023-06-05 ENCOUNTER — Encounter (INDEPENDENT_AMBULATORY_CARE_PROVIDER_SITE_OTHER): Payer: Self-pay | Admitting: Family Medicine

## 2023-06-05 VITALS — BP 113/73 | HR 65 | Temp 97.8°F | Ht 63.0 in | Wt 193.0 lb

## 2023-06-05 DIAGNOSIS — E669 Obesity, unspecified: Secondary | ICD-10-CM

## 2023-06-05 DIAGNOSIS — R7303 Prediabetes: Secondary | ICD-10-CM

## 2023-06-05 DIAGNOSIS — Z6834 Body mass index (BMI) 34.0-34.9, adult: Secondary | ICD-10-CM

## 2023-06-05 DIAGNOSIS — E559 Vitamin D deficiency, unspecified: Secondary | ICD-10-CM

## 2023-06-05 MED ORDER — VITAMIN D (ERGOCALCIFEROL) 1.25 MG (50000 UNIT) PO CAPS: 50000.0000 [IU] | ORAL_CAPSULE | ORAL | 0 refills | Status: DC
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
## 2023-06-05 NOTE — Assessment & Plan Note (Signed)
Last vitamin D Lab Results  Component Value Date   VD25OH 38.9 01/30/2023   She has been taking prescription vitamin D 50,000 IU once weekly.  Energy level has improved.  She denies adverse side effects.  Recheck level today

## 2023-06-05 NOTE — Assessment & Plan Note (Signed)
Lab Results  Component Value Date   HGBA1C 5.8 (H) 01/30/2023   Kristine Olson has done well with dietary change, improving physical activity and weight reduction.  She has lost 13% total body weight in the past 8 months of medically supervised weight management.  She is still consuming some high sugar drinks on occasion.  Continue working on prescribed dietary plan, reducing sugar sweetened beverage intake.  Reduce intake of starches and focus on lean protein and fiber with meals.  Continue regular exercise increasing total daily exercise frequency to 4 days a week.  Recheck A1c today.

## 2023-06-15 DIAGNOSIS — H938X2 Other specified disorders of left ear: Secondary | ICD-10-CM | POA: Diagnosis not present

## 2023-06-15 DIAGNOSIS — Z9622 Myringotomy tube(s) status: Secondary | ICD-10-CM | POA: Diagnosis not present

## 2023-06-15 DIAGNOSIS — H903 Sensorineural hearing loss, bilateral: Secondary | ICD-10-CM | POA: Diagnosis not present

## 2023-07-03 ENCOUNTER — Ambulatory Visit: Payer: Medicare PPO | Admitting: Family Medicine

## 2023-07-03 ENCOUNTER — Encounter: Payer: Self-pay | Admitting: Family Medicine

## 2023-07-03 VITALS — BP 109/67 | HR 69 | Temp 97.6°F | Ht 63.0 in | Wt 192.0 lb

## 2023-07-03 DIAGNOSIS — R7303 Prediabetes: Secondary | ICD-10-CM

## 2023-07-03 DIAGNOSIS — E538 Deficiency of other specified B group vitamins: Secondary | ICD-10-CM | POA: Diagnosis not present

## 2023-07-03 DIAGNOSIS — E559 Vitamin D deficiency, unspecified: Secondary | ICD-10-CM

## 2023-07-03 DIAGNOSIS — Z6834 Body mass index (BMI) 34.0-34.9, adult: Secondary | ICD-10-CM

## 2023-07-03 NOTE — Progress Notes (Signed)
Office: [phone]  /  Fax: [phone]  WEIGHT SUMMARY AND BIOMETRICS  Starting Date: 10/12/22  Starting Weight: 222lb   Weight Lost Since Last Visit: 1lb   Vitals Temp: 97.6 F (36.4 C) BP: 109/67 Pulse Rate: 69 SpO2: 93 %   Body Composition  Body Fat %: 41.1 % Fat Mass (lbs): 79 lbs Muscle Mass (lbs): 107.6 lbs Total Body Water (lbs): 73.4 lbs Visceral Fat Rating : 13   HPI  Chief Complaint: OBESITY  Kristine Olson is here to discuss her progress with her obesity treatment plan. She is on the the Category 1 Plan and states she is following her eating plan approximately 80 % of the time. She states she is exercising 50 minutes 2 times per week.  Interval History:  Since last office visit she is down 1 lb She went on vacation to [facility] and did well maintaining her weight  She is up 7.8 pounds of muscle mass and down 9.4 pounds of body fat since her last visit Her percent body fat reduced from 45.7 to 41.1 in the past month Her net weight loss is 30 pounds in the past 9 months of medically supervised weight management This is a 13.5% total body weight loss without use of antiobesity medications She has not been tracking her daily intake She has restarted the right start program at [facility] well doing cardio and resistance training 2 days a week She has an upcoming trip to [place] in November  Pharmacotherapy: None  PHYSICAL EXAM:  Blood pressure 109/67, pulse 69, temperature 97.6 F (36.4 C), height 5\' 3"  (1.6 m), weight 192 lb (87.1 kg), SpO2 93%. Body mass index is 34.01 kg/m.  General: She is overweight, cooperative, alert, well developed, and in no acute distress. PSYCH: Has normal mood, affect and thought process.   Lungs: Normal breathing effort, no conversational dyspnea.   ASSESSMENT AND PLAN  TREATMENT PLAN FOR OBESITY:  Recommended Dietary Goals  Kristine Olson is currently in the action stage of change. As such, her goal is to continue weight  management plan. She has agreed to keeping a food journal and adhering to recommended goals of 1200 calories and 85 g of protein.  Behavioral Intervention  We discussed the following Behavioral Modification Strategies today: increasing lean protein intake, decreasing simple carbohydrates , increasing vegetables, increasing lower glycemic fruits, increasing water intake, work on meal planning and preparation, work on tracking and journaling calories using tracking application, reading food labels , avoiding temptations and identifying enticing environmental cues, continue to practice mindfulness when eating, and planning for success.  Additional resources provided today: NA  Recommended Physical Activity Goals  Kristine Olson has been advised to work up to 150 minutes of moderate intensity aerobic activity a week and strengthening exercises 2-3 times per week for cardiovascular health, weight loss maintenance and preservation of muscle mass.   She has agreed to Increase the intensity, frequency or duration of strengthening exercises  and Increase the intensity, frequency or duration of aerobic exercises   -Reviewed options to add in home exercise. -She plans on joining [employer] [employer] [facility] when she completes the right start program  Pharmacotherapy changes for the treatment of obesity: None  ASSOCIATED CONDITIONS ADDRESSED TODAY  Prediabetes Assessment & Plan: Lab Results  Component Value Date   HGBA1C 5.6 06/05/2023   Improved.  Reviewed labs from last visit.  Her A1c has dropped from 5.8-5.6, now normal with 13.5% total body weight loss in the past 9 months of medically supervised  weight management using a reduced calorie high-protein/low sugar diet.  She has been more physically active and is happy to see her progress.  Continue current dietary changes.  Increase walking time with a target goal of 150 minutes/week   Vitamin D deficiency Assessment & Plan: Last vitamin D Lab  Results  Component Value Date   VD25OH 44.3 06/05/2023  Reviewed labs from last visit.  Her vitamin D level is improving.  She was taking vitamin D 50,000 IU once weekly.  She has 2 more pills to take at home.  She will finish out her vitamin D 50,000 IU once weekly and then change over to over-the-counter vitamin D 3000 IU once daily.  Plan to recheck this winter.   Morbid obesity (HCC) with starting BMI 39  BMI 34.0-34.9,adult  B12 deficiency Assessment & Plan: Her last B12 level improved to normal.  She has been taking over-the-counter vitamin B12 500 mcg once daily.  She denies paresthesias, brain fog or extreme fatigue.  Continue vitamin B12 500 mcg once daily and plan to recheck her vitamin B12 level with her next labs.       She was informed of the importance of frequent follow up visits to maximize her success with intensive lifestyle modifications for her multiple health conditions.   ATTESTASTION STATEMENTS:  Reviewed by clinician on day of visit: allergies, medications, problem list, medical history, surgical history, family history, social history, and previous encounter notes pertinent to obesity diagnosis.   I have personally spent 30 minutes total time today in preparation, patient care, nutritional counseling and documentation for this visit, including the following: review of clinical lab tests; review of medical tests/procedures/services.      Kristine Brink, DO DABFM, DABOM [facility] [address] [place], [place] [address] [phone]

## 2023-07-03 NOTE — Assessment & Plan Note (Signed)
Last vitamin D Lab Results  Component Value Date   VD25OH 44.3 06/05/2023  Reviewed labs from last visit.  Her vitamin D level is improving.  She was taking vitamin D 50,000 IU once weekly.  She has 2 more pills to take at home.  She will finish out her vitamin D 50,000 IU once weekly and then change over to over-the-counter vitamin D 3000 IU once daily.  Plan to recheck this winter.

## 2023-07-03 NOTE — Assessment & Plan Note (Signed)
Lab Results  Component Value Date   HGBA1C 5.6 06/05/2023   Improved.  Reviewed labs from last visit.  Her A1c has dropped from 5.8-5.6, now normal with 13.5% total body weight loss in the past 9 months of medically supervised weight management using a reduced calorie high-protein/low sugar diet.  She has been more physically active and is happy to see her progress.  Continue current dietary changes.  Increase walking time with a target goal of 150 minutes/week

## 2023-07-03 NOTE — Assessment & Plan Note (Signed)
Her last B12 level improved to normal.  She has been taking over-the-counter vitamin B12 500 mcg once daily.  She denies paresthesias, brain fog or extreme fatigue.  Continue vitamin B12 500 mcg once daily and plan to recheck her vitamin B12 level with her next labs.

## 2023-07-04 DIAGNOSIS — F411 Generalized anxiety disorder: Secondary | ICD-10-CM | POA: Diagnosis not present

## 2023-08-01 ENCOUNTER — Encounter: Payer: Self-pay | Admitting: Family Medicine

## 2023-08-01 ENCOUNTER — Ambulatory Visit: Payer: Medicare PPO | Admitting: Family Medicine

## 2023-08-01 VITALS — BP 109/71 | HR 61 | Temp 97.8°F | Ht 63.0 in | Wt 198.0 lb

## 2023-08-01 DIAGNOSIS — R7303 Prediabetes: Secondary | ICD-10-CM | POA: Diagnosis not present

## 2023-08-01 DIAGNOSIS — E559 Vitamin D deficiency, unspecified: Secondary | ICD-10-CM | POA: Diagnosis not present

## 2023-08-01 DIAGNOSIS — E66812 Obesity, class 2: Secondary | ICD-10-CM

## 2023-08-01 DIAGNOSIS — E538 Deficiency of other specified B group vitamins: Secondary | ICD-10-CM

## 2023-08-01 DIAGNOSIS — Z6835 Body mass index (BMI) 35.0-35.9, adult: Secondary | ICD-10-CM

## 2023-08-01 NOTE — Progress Notes (Signed)
Office: [phone]  /  Fax: [phone]  WEIGHT SUMMARY AND BIOMETRICS  Starting Date: 10/12/22  Starting Weight: 222lb   Weight Lost Since Last Visit: 0lb   Vitals Temp: 97.8 F (36.6 C) BP: 109/71 Pulse Rate: 61 SpO2: 95 %   Body Composition  Body Fat %: 46.1 % Fat Mass (lbs): 91.4 lbs Muscle Mass (lbs): 101.6 lbs Total Body Water (lbs): 75 lbs Visceral Fat Rating : 15    HPI  Chief Complaint: OBESITY  Kristine Olson is here to discuss her progress with her obesity treatment plan. She is on the the Category 1 Plan and states she is following her eating plan approximately 25 % of the time. She states she is exercising 30 minutes 1-2 times per week.   Interval History:  Since last office visit she is up 6 lb She admits to getting off track She fell back into bad snacking habits with cookies, white cheddar popcorn She did do some stress eating She goes to [place] next month She has been eating less fruits She is eating more salads She did lose muscle and gained body fat since last visit She has not been going to the gym Her husband has brought in junk food She has a net weight loss of 24 lb in the past 10 mos  Pharmacotherapy: none   PHYSICAL EXAM:  Blood pressure 109/71, pulse 61, temperature 97.8 F (36.6 C), height 5\' 3"  (1.6 m), weight 198 lb (89.8 kg), SpO2 95%. Body mass index is 35.07 kg/m.  General: She is overweight, cooperative, alert, well developed, and in no acute distress. PSYCH: Has normal mood, affect and thought process.   Lungs: Normal breathing effort, no conversational dyspnea.   ASSESSMENT AND PLAN  TREATMENT PLAN FOR OBESITY:  Recommended Dietary Goals  Kristine Olson is currently in the action stage of change. As such, her goal is to continue weight management plan. She has agreed to keeping a food journal and adhering to recommended goals of 1200 calories and 80 g of  protein. - read labels on food and drink for added sugar;  avoid products with over 8 g of added sugar per serving - keep junk food snacks out of the house/ out of sight  Behavioral Intervention  We discussed the following Behavioral Modification Strategies today: increasing lean protein intake to established goals, decreasing simple carbohydrates , increasing vegetables, increasing lower glycemic fruits, increasing water intake , work on meal planning and preparation, keeping healthy foods at home, decreasing eating out or consumption of processed foods, and making healthy choices when eating convenient foods, practice mindfulness eating and understand the difference between hunger signals and cravings, work on managing stress, creating time for self-care and relaxation, avoiding temptations and identifying enticing environmental cues, and continue to work on maintaining a reduced calorie state, getting the recommended amount of protein, incorporating whole foods, making healthy choices, staying well hydrated and practicing mindfulness when eating..  Additional resources provided today: NA  Recommended Physical Activity Goals  Kristine Olson has been advised to work up to 150 minutes of moderate intensity aerobic activity a week and strengthening exercises 2-3 times per week for cardiovascular health, weight loss maintenance and preservation of muscle mass.   She has agreed to Patient will begin to exercise agrees to gym workouts + walking for a total of 3 days/ wk  Pharmacotherapy changes for the treatment of obesity: none  ASSOCIATED CONDITIONS ADDRESSED TODAY  Prediabetes Assessment & Plan: Lab Results  Component Value Date   HGBA1C  5.6 06/05/2023   She did see improvements in prediabetes on last labs but is struggling more recently with weight regain, consuming more snack foods and sweets and has been less physically active.  Recommend reading labels for added sugars and logging daily intake on a calorie tracking ap Consider future use of  metformin if needed   Class 2 severe obesity due to excess calories with serious comorbidity and body mass index (BMI) of 35.0 to 35.9 in adult Kristine Olson)  B12 deficiency Assessment & Plan: Taking vitamin B12 500 mcg daily C/o fatigue  Repeat  B12 level in 2 mos   Vitamin D deficiency Assessment & Plan: She is off on RX vitamin D and has not been taking a daily vitamin D supplement. C/o fatigue Repeat vitamin D level in 2 mos  Last vitamin D Lab Results  Component Value Date   VD25OH 44.3 06/05/2023          She was informed of the importance of frequent follow up visits to maximize her success with intensive lifestyle modifications for her multiple health conditions.   ATTESTASTION STATEMENTS:  Reviewed by clinician on day of visit: allergies, medications, problem list, medical history, surgical history, family history, social history, and previous encounter notes pertinent to obesity diagnosis.   I have personally spent 30 minutes total time today in preparation, patient care, nutritional counseling and documentation for this visit, including the following: review of clinical lab tests; review of medical tests/procedures/services.      Glennis Brink, DO DABFM, DABOM [facility] [address] [place], [place] [address] [phone]

## 2023-08-01 NOTE — Assessment & Plan Note (Signed)
Lab Results  Component Value Date   HGBA1C 5.6 06/05/2023   She did see improvements in prediabetes on last labs but is struggling more recently with weight regain, consuming more snack foods and sweets and has been less physically active.  Recommend reading labels for added sugars and logging daily intake on a calorie tracking ap Consider future use of metformin if needed

## 2023-08-01 NOTE — Patient Instructions (Signed)
Stay on vitamin B12 500 mcg daily Stay on OTC vitamin D3 2,000 international units  daily  Goals: Gym workouts OR outdoor walks for a total of 3 x a week  Try to log daily intake on the Lose It Ap 1200 cal/ day goal Total daily protein goal: 80 g / day Read labels on food and drink for ADDED SUGAR - avoid products with over 8 g of added sugar per serving.  Get in 2 servings of fresh fruit daily Get in plenty of non starchy veggies

## 2023-08-01 NOTE — Assessment & Plan Note (Signed)
Taking vitamin B12 500 mcg daily C/o fatigue  Repeat  B12 level in 2 mos

## 2023-08-01 NOTE — Assessment & Plan Note (Signed)
She is off on RX vitamin D and has not been taking a daily vitamin D supplement. C/o fatigue Repeat vitamin D level in 2 mos  Last vitamin D Lab Results  Component Value Date   VD25OH 44.3 06/05/2023

## 2023-09-11 ENCOUNTER — Ambulatory Visit: Payer: Medicare PPO | Admitting: Family Medicine

## 2023-09-12 ENCOUNTER — Encounter: Payer: Self-pay | Admitting: Family Medicine

## 2023-09-12 ENCOUNTER — Ambulatory Visit: Payer: Medicare PPO | Admitting: Family Medicine

## 2023-09-12 VITALS — BP 118/77 | HR 65 | Temp 98.1°F | Ht 63.0 in | Wt 193.0 lb

## 2023-09-12 DIAGNOSIS — E559 Vitamin D deficiency, unspecified: Secondary | ICD-10-CM

## 2023-09-12 DIAGNOSIS — Z6834 Body mass index (BMI) 34.0-34.9, adult: Secondary | ICD-10-CM

## 2023-09-12 DIAGNOSIS — R7303 Prediabetes: Secondary | ICD-10-CM | POA: Diagnosis not present

## 2023-09-12 DIAGNOSIS — E66811 Obesity, class 1: Secondary | ICD-10-CM | POA: Diagnosis not present

## 2023-09-12 DIAGNOSIS — E538 Deficiency of other specified B group vitamins: Secondary | ICD-10-CM

## 2023-09-12 NOTE — Patient Instructions (Signed)
Log daily intake on the Lose It Ap Aim for 1200 cal /day with an 80+ g of protein daily goal  Limit foods and drinks with > 8 g of sugar daily Limit dairy for the next 3 days and hydrate well with water Try Thin Sliced [employer] Killer Bread (high fiber)  Aim for gym workouts 2 days/ wk + home exercise/ walking 1-2 x a week  Work on positive self talk! You are doing great!

## 2023-09-12 NOTE — Assessment & Plan Note (Signed)
Lab Results  Component Value Date   VITAMINB12 634 01/30/2023   Improved.  She has reduced vitamin B12 to 1,000 mcg 3 days/ wk Energy level stable  Repeat lab next visit

## 2023-09-12 NOTE — Assessment & Plan Note (Signed)
Last vitamin D Lab Results  Component Value Date   VD25OH 44.3 06/05/2023   She is taking OTC vitamin D 2,000 international units  daily for maintenance Her vitamin D level has improved over the past year  Repeat lab next visit

## 2023-09-12 NOTE — Assessment & Plan Note (Signed)
Lab Results  Component Value Date   HGBA1C 5.6 06/05/2023   Improved.  She has seen resolution of prediabetes with healthy dietary changes and weight loss.  Continue a low sugar diet Increase both walking and resistance training to 3 x a week

## 2023-09-12 NOTE — Progress Notes (Signed)
Office: [phone]  /  Fax: [phone]  WEIGHT SUMMARY AND BIOMETRICS  Starting Date: 10/12/22  Starting Weight: 222lb   Weight Lost Since Last Visit: 5lb   Vitals Temp: 98.1 F (36.7 C) BP: 118/77 Pulse Rate: 65 SpO2: 94 %   Body Composition  Body Fat %: 45.6 % Fat Mass (lbs): 88 lbs Muscle Mass (lbs): 99.8 lbs Total Body Water (lbs): 74.2 lbs Visceral Fat Rating : 14   HPI  Chief Complaint: OBESITY  Kristine Olson is here to discuss her progress with her obesity treatment plan. She is on the the Category 1 Plan and states she is following her eating plan approximately 30 % of the time. She states she is walking more daily.    Interval History:  Since last office visit she is down 5 lb She had a 10 day trip to [place] and did a lot of walking She is working out with a Psychologist, educational at [employer]-- just starting 2 x a week She has a net weight loss of 29 lb in 11 mos This is a 13% TBW loss in 11 mos without use of AOMs She hs not been tracking calories She is craving more bread and is still not happy at her current weight  Pharmacotherapy: none  PHYSICAL EXAM:  Blood pressure 118/77, pulse 65, temperature 98.1 F (36.7 C), height 5\' 3"  (1.6 m), weight 193 lb (87.5 kg), SpO2 94%. Body mass index is 34.19 kg/m.  General: She is overweight, cooperative, alert, well developed, and in no acute distress. PSYCH: Has normal mood, affect and thought process.   Lungs: Normal breathing effort, no conversational dyspnea.   ASSESSMENT AND PLAN  TREATMENT PLAN FOR OBESITY:  Recommended Dietary Goals  Kristine Olson is currently in the action stage of change. As such, her goal is to continue weight management plan. She has agreed to keeping a food journal and adhering to recommended goals of 1200 calories and 80 g of  protein.  Behavioral Intervention  We discussed the following Behavioral Modification Strategies today: increasing lean protein intake to established goals,  increasing vegetables, increasing fiber rich foods, increasing water intake , work on meal planning and preparation, keeping healthy foods at home, work on managing stress, creating time for self-care and relaxation, planning for success, and continue to work on maintaining a reduced calorie state, getting the recommended amount of protein, incorporating whole foods, making healthy choices, staying well hydrated and practicing mindfulness when eating..  Additional resources provided today: NA  Recommended Physical Activity Goals  Kristine Olson has been advised to work up to 150 minutes of moderate intensity aerobic activity a week and strengthening exercises 2-3 times per week for cardiovascular health, weight loss maintenance and preservation of muscle mass.   She has agreed to [employer] strengthening exercises with a goal of 2-3 sessions a week  and Increase the intensity, frequency or duration of aerobic exercises    Pharmacotherapy changes for the treatment of obesity: none  ASSOCIATED CONDITIONS ADDRESSED TODAY  Problem List Items Addressed This Visit     Vitamin D deficiency    Last vitamin D Lab Results  Component Value Date   VD25OH 44.3 06/05/2023   She is taking OTC vitamin D 2,000 international units  daily for maintenance Her vitamin D level has improved over the past year  Repeat lab next visit       B12 deficiency    Lab Results  Component Value Date   VITAMINB12 634 01/30/2023   Improved.  She has  reduced vitamin B12 to 1,000 mcg 3 days/ wk Energy level stable  Repeat lab next visit      Prediabetes - Primary    Lab Results  Component Value Date   HGBA1C 5.6 06/05/2023   Improved.  She has seen resolution of prediabetes with healthy dietary changes and weight loss.  Continue a low sugar diet Increase both walking and resistance training to 3 x a week      Other Visit Diagnoses     Class 1 obesity due to excess calories with body mass index (BMI) of 34.0  to 34.9 in adult, unspecified whether serious comorbidity present                She was informed of the importance of frequent follow up visits to maximize her success with intensive lifestyle modifications for her multiple health conditions.   ATTESTASTION STATEMENTS:  Reviewed by clinician on day of visit: allergies, medications, problem list, medical history, surgical history, family history, social history, and previous encounter notes pertinent to obesity diagnosis.   I have personally spent 30 minutes total time today in preparation, patient care, nutritional counseling and documentation for this visit, including the following: review of clinical lab tests; review of medical tests/procedures/services.      Glennis Brink, DO DABFM, DABOM [facility] [address] [place], [place] [address] [phone]

## 2023-10-03 DIAGNOSIS — R7303 Prediabetes: Secondary | ICD-10-CM | POA: Diagnosis not present

## 2023-10-03 DIAGNOSIS — Z6835 Body mass index (BMI) 35.0-35.9, adult: Secondary | ICD-10-CM | POA: Diagnosis not present

## 2023-10-03 DIAGNOSIS — Z8269 Family history of other diseases of the musculoskeletal system and connective tissue: Secondary | ICD-10-CM | POA: Diagnosis not present

## 2023-10-03 DIAGNOSIS — G4733 Obstructive sleep apnea (adult) (pediatric): Secondary | ICD-10-CM | POA: Diagnosis not present

## 2023-10-03 DIAGNOSIS — I10 Essential (primary) hypertension: Secondary | ICD-10-CM | POA: Diagnosis not present

## 2023-10-03 DIAGNOSIS — H269 Unspecified cataract: Secondary | ICD-10-CM | POA: Diagnosis not present

## 2023-10-03 DIAGNOSIS — E88819 Insulin resistance, unspecified: Secondary | ICD-10-CM | POA: Diagnosis not present

## 2023-10-03 DIAGNOSIS — Z818 Family history of other mental and behavioral disorders: Secondary | ICD-10-CM | POA: Diagnosis not present

## 2023-10-03 DIAGNOSIS — M8580 Other specified disorders of bone density and structure, unspecified site: Secondary | ICD-10-CM | POA: Diagnosis not present

## 2023-10-03 DIAGNOSIS — Z820 Family history of epilepsy and other diseases of the nervous system: Secondary | ICD-10-CM | POA: Diagnosis not present

## 2023-10-03 DIAGNOSIS — Z881 Allergy status to other antibiotic agents status: Secondary | ICD-10-CM | POA: Diagnosis not present

## 2023-10-03 DIAGNOSIS — E039 Hypothyroidism, unspecified: Secondary | ICD-10-CM | POA: Diagnosis not present

## 2023-10-03 DIAGNOSIS — Z880 Allergy status to penicillin: Secondary | ICD-10-CM | POA: Diagnosis not present

## 2023-10-03 DIAGNOSIS — K219 Gastro-esophageal reflux disease without esophagitis: Secondary | ICD-10-CM | POA: Diagnosis not present

## 2023-10-03 DIAGNOSIS — Z882 Allergy status to sulfonamides status: Secondary | ICD-10-CM | POA: Diagnosis not present

## 2023-10-03 DIAGNOSIS — R32 Unspecified urinary incontinence: Secondary | ICD-10-CM | POA: Diagnosis not present

## 2023-10-03 DIAGNOSIS — Z79890 Hormone replacement therapy (postmenopausal): Secondary | ICD-10-CM | POA: Diagnosis not present

## 2023-10-10 ENCOUNTER — Encounter: Payer: Self-pay | Admitting: Family Medicine

## 2023-10-10 ENCOUNTER — Ambulatory Visit: Payer: Medicare PPO | Admitting: Family Medicine

## 2023-10-10 VITALS — BP 127/80 | HR 63 | Temp 97.6°F | Ht 63.0 in | Wt 197.0 lb

## 2023-10-10 DIAGNOSIS — E538 Deficiency of other specified B group vitamins: Secondary | ICD-10-CM | POA: Diagnosis not present

## 2023-10-10 DIAGNOSIS — I10 Essential (primary) hypertension: Secondary | ICD-10-CM

## 2023-10-10 DIAGNOSIS — R7303 Prediabetes: Secondary | ICD-10-CM

## 2023-10-10 DIAGNOSIS — E6609 Other obesity due to excess calories: Secondary | ICD-10-CM

## 2023-10-10 DIAGNOSIS — Z6834 Body mass index (BMI) 34.0-34.9, adult: Secondary | ICD-10-CM | POA: Diagnosis not present

## 2023-10-10 DIAGNOSIS — E559 Vitamin D deficiency, unspecified: Secondary | ICD-10-CM | POA: Diagnosis not present

## 2023-10-10 DIAGNOSIS — E66811 Obesity, class 1: Secondary | ICD-10-CM | POA: Diagnosis not present

## 2023-10-10 DIAGNOSIS — E88819 Insulin resistance, unspecified: Secondary | ICD-10-CM

## 2023-10-10 NOTE — Assessment & Plan Note (Signed)
Blood pressure is at goal.  She reports good compliance taking metoprolol 25 mg tab one half tab twice daily and spironolactone 25 mg tab once daily  Chemistry panel is due today will update Continue working on healthy lifestyle changes with a goal BMI  30

## 2023-10-10 NOTE — Assessment & Plan Note (Signed)
Last fasting insulin had improved from 17.9-14.1.  She has actively been working on weight reduction, reducing added sugar and refined carbohydrates but has been more inconsistent over the holidays.  She has been more consistent with regular exercise over the past 6 months.  She has plans to increase her walking time and weight training at the gym after the holidays.  Repeat fasting insulin level today

## 2023-10-10 NOTE — Assessment & Plan Note (Signed)
Last vitamin D Lab Results  Component Value Date   VD25OH 44.3 06/05/2023   Her vitamin D level did improve.  She has switched over to an over-the-counter vitamin D 2000 IU once daily  Recheck level today

## 2023-10-10 NOTE — Assessment & Plan Note (Signed)
A1c was unchanged at 5.8.  She has lost 11% of her total body weight in the last 1 year medically supervised weight management.  She has been inconsistent with regular exercise and reducing starches and sugar intake. She has opted against starting metformin.  Recheck A1c today

## 2023-10-10 NOTE — Progress Notes (Signed)
Office: [phone]  /  Fax: [phone]  WEIGHT SUMMARY AND BIOMETRICS  Starting Date: 10/12/22  Starting Weight: 222lb   Weight Lost Since Last Visit: 0lb   Vitals Temp: 97.6 F (36.4 C) BP: 127/80 Pulse Rate: 63 SpO2: 97 %   Body Composition  Body Fat %: 45.6 % Fat Mass (lbs): 90 lbs Muscle Mass (lbs): 102 lbs Total Body Water (lbs): 74 lbs Visceral Fat Rating : 15   HPI  Chief Complaint: OBESITY  Svea is here to discuss her progress with her obesity treatment plan. She is on the the Category 1 Plan and states she is following her eating plan approximately 20 % of the time. She states she is exercising 30 minutes 1 times per week.  Interval History:  Since last office visit she is up 4 lb She is up 2.2 lb of muscle mass and is up 2 lb of body fat since last month She has indulged more on sweets over the holidays She has had more meals out and celebrations She has a net weight loss of 25 lb in the past year of medically supervised weight management This is an 11.2% total body weight loss She is doing weight training at [employer] 2 days a week but has been less consistent over the holidays  Pharmacotherapy: None  PHYSICAL EXAM:  Blood pressure 127/80, pulse 63, temperature 97.6 F (36.4 C), height 5\' 3"  (1.6 m), weight 197 lb (89.4 kg), SpO2 97%. Body mass index is 34.9 kg/m.  General: She is overweight, cooperative, alert, well developed, and in no acute distress. PSYCH: Has normal mood, affect and thought process.   Lungs: Normal breathing effort, no conversational dyspnea.   ASSESSMENT AND PLAN  TREATMENT PLAN FOR OBESITY:  Recommended Dietary Goals  Roquel is currently in the action stage of change. As such, her goal is to continue weight management plan. She has agreed to keeping a food journal and adhering to recommended goals of 1200 calories and 80 g of protein.  Behavioral Intervention  We discussed the following Behavioral  Modification Strategies today: increasing lean protein intake to established goals, increasing fiber rich foods, increasing water intake , work on meal planning and preparation, work on Counselling psychologist calories using tracking application, keeping healthy foods at home, practice mindfulness eating and understand the difference between hunger signals and cravings, avoiding temptations and identifying enticing environmental cues, planning for success, and continue to work on maintaining a reduced calorie state, getting the recommended amount of protein, incorporating whole foods, making healthy choices, staying well hydrated and practicing mindfulness when eating.. She may enjoy some sweets and treats in moderation, limiting quantity and frequency We discussed focusing on filling up on lean protein and fiber at mealtime to minimize over snacking She may utilize [employer] or premade meals for easy grab and go options Additional resources provided today: NA  Recommended Physical Activity Goals  Saraia has been advised to work up to 150 minutes of moderate intensity aerobic activity a week and strengthening exercises 2-3 times per week for cardiovascular health, weight loss maintenance and preservation of muscle mass.  Aim to resume gym workouts to include cardio and weight training at least 2 days a week, adding in a 30-day week of exercise in January which may include outdoor walking  She has agreed to Work on scheduling and tracking physical activity.   Pharmacotherapy changes for the treatment of obesity: None  ASSOCIATED CONDITIONS ADDRESSED TODAY  B12 deficiency Assessment & Plan:  Her last B12 level had normalized.  She is not complaining of fatigue or paresthesias.  She has been off of prescription vitamin B12 supplement.  Recheck level today  Orders: -     Vitamin B12  Vitamin D deficiency Assessment & Plan: Last vitamin D Lab Results  Component Value Date    VD25OH 44.3 06/05/2023   Her vitamin D level did improve.  She has switched over to an over-the-counter vitamin D 2000 IU once daily  Recheck level today  Orders: -     VITAMIN D 25 Hydroxy (Vit-D Deficiency, Fractures)  Pre-diabetes Assessment & Plan: A1c was unchanged at 5.8.  She has lost 11% of her total body weight in the last 1 year medically supervised weight management.  She has been inconsistent with regular exercise and reducing starches and sugar intake. She has opted against starting metformin.  Recheck A1c today  Orders: -     Insulin, random -     Hemoglobin A1c  Essential hypertension Assessment & Plan: Blood pressure is at goal.  She reports good compliance taking metoprolol 25 mg tab one half tab twice daily and spironolactone 25 mg tab once daily  Chemistry panel is due today will update Continue working on healthy lifestyle changes with a goal BMI  30  Orders: -     Comprehensive metabolic panel  Class 1 obesity due to excess calories with body mass index (BMI) of 34.0 to 34.9 in adult, unspecified whether serious comorbidity present  Insulin resistance Assessment & Plan: Last fasting insulin had improved from 17.9-14.1.  She has actively been working on weight reduction, reducing added sugar and refined carbohydrates but has been more inconsistent over the holidays.  She has been more consistent with regular exercise over the past 6 months.  She has plans to increase her walking time and weight training at the gym after the holidays.  Repeat fasting insulin level today       She was informed of the importance of frequent follow up visits to maximize her success with intensive lifestyle modifications for her multiple health conditions.   ATTESTASTION STATEMENTS:  Reviewed by clinician on day of visit: allergies, medications, problem list, medical history, surgical history, family history, social history, and previous encounter notes pertinent to  obesity diagnosis.   I have personally spent 30 minutes total time today in preparation, patient care, nutritional counseling and documentation for this visit, including the following: review of clinical lab tests; review of medical tests/procedures/services.      Glennis Brink, DO DABFM, DABOM [facility] [address] [place], [place] [address] [phone]

## 2023-10-10 NOTE — Assessment & Plan Note (Signed)
Her last B12 level had normalized.  She is not complaining of fatigue or paresthesias.  She has been off of prescription vitamin B12 supplement.  Recheck level today

## 2023-10-11 LAB — COMPREHENSIVE METABOLIC PANEL
ALT: 13 [IU]/L (ref 0–32)
AST: 20 [IU]/L (ref 0–40)
Albumin: 4.3 g/dL (ref 3.8–4.8)
Alkaline Phosphatase: 97 [IU]/L (ref 44–121)
BUN/Creatinine Ratio: 14 (ref 12–28)
BUN: 15 mg/dL (ref 8–27)
Bilirubin Total: 0.2 mg/dL (ref 0.0–1.2)
CO2: 23 mmol/L (ref 20–29)
Calcium: 9.7 mg/dL (ref 8.7–10.3)
Chloride: 104 mmol/L (ref 96–106)
Creatinine, Ser: 1.1 mg/dL — ABNORMAL HIGH (ref 0.57–1.00)
Globulin, Total: 2.7 g/dL (ref 1.5–4.5)
Glucose: 100 mg/dL — ABNORMAL HIGH (ref 70–99)
Potassium: 5 mmol/L (ref 3.5–5.2)
Sodium: 142 mmol/L (ref 134–144)
Total Protein: 7 g/dL (ref 6.0–8.5)
eGFR: 52 mL/min/{1.73_m2} — ABNORMAL LOW (ref >59)

## 2023-10-11 LAB — HEMOGLOBIN A1C
Est. average glucose Bld gHb Est-mCnc: 120 mg/dL
Hgb A1c MFr Bld: 5.8 % — ABNORMAL HIGH (ref 4.8–5.6)

## 2023-10-11 LAB — VITAMIN B12: Vitamin B-12: 911 pg/mL (ref 232–1245)

## 2023-10-11 LAB — VITAMIN D 25 HYDROXY (VIT D DEFICIENCY, FRACTURES): Vit D, 25-Hydroxy: 35.2 ng/mL (ref 30.0–100.0)

## 2023-10-11 LAB — INSULIN, RANDOM: INSULIN: 18.5 u[IU]/mL (ref 2.6–24.9)

## 2023-11-10 ENCOUNTER — Other Ambulatory Visit: Payer: Self-pay | Admitting: Family Medicine

## 2023-11-13 ENCOUNTER — Other Ambulatory Visit: Payer: Self-pay | Admitting: Family Medicine

## 2023-11-13 DIAGNOSIS — H353131 Nonexudative age-related macular degeneration, bilateral, early dry stage: Secondary | ICD-10-CM | POA: Diagnosis not present

## 2023-11-13 DIAGNOSIS — H25813 Combined forms of age-related cataract, bilateral: Secondary | ICD-10-CM | POA: Diagnosis not present

## 2023-11-19 ENCOUNTER — Ambulatory Visit: Payer: Medicare PPO | Admitting: Family Medicine

## 2023-11-19 ENCOUNTER — Encounter: Payer: Self-pay | Admitting: Family Medicine

## 2023-11-19 VITALS — BP 124/77 | HR 68 | Temp 98.1°F | Ht 63.0 in | Wt 199.0 lb

## 2023-11-19 DIAGNOSIS — E559 Vitamin D deficiency, unspecified: Secondary | ICD-10-CM

## 2023-11-19 DIAGNOSIS — Z6835 Body mass index (BMI) 35.0-35.9, adult: Secondary | ICD-10-CM | POA: Diagnosis not present

## 2023-11-19 DIAGNOSIS — R7303 Prediabetes: Secondary | ICD-10-CM | POA: Diagnosis not present

## 2023-11-19 DIAGNOSIS — E66812 Obesity, class 2: Secondary | ICD-10-CM | POA: Diagnosis not present

## 2023-11-19 DIAGNOSIS — E6609 Other obesity due to excess calories: Secondary | ICD-10-CM

## 2023-11-19 NOTE — Assessment & Plan Note (Signed)
Lab Results  Component Value Date   HGBA1C 5.8 (H) 10/10/2023   Stable.  Patient has had a stable A1c.  She has associated insulin resistance which has slightly improved.  She has been struggling more recently with comfort foods and sugar intake.  Her exercise has reduced over the winter months.  Her motivation has been waning as she has been part of our program for over 1 year.  Continue to focus on balancing intake of lean protein and fiber with meals while minimizing intake of added sugar and refined carbohydrates. Recommend both cardio and resistance training exercise 2 to 3 days a week on a more consistent basis

## 2023-11-19 NOTE — Assessment & Plan Note (Signed)
Last vitamin D Lab Results  Component Value Date   VD25OH 35.2 10/10/2023   Her last vitamin D level and did reduce.  She is taking over-the-counter vitamin D.  Aim for vitamin D at 2000 IU once daily in addition to a multivitamin daily.  Bone density testing due every 2 years.

## 2023-11-19 NOTE — Progress Notes (Signed)
Office: [phone]  /  Fax: [phone]  WEIGHT SUMMARY AND BIOMETRICS  Starting Date: 10/12/22  Starting Weight: 222lb   Weight Lost Since Last Visit: 0lb   Vitals Temp: 98.1 F (36.7 C) BP: 124/77 Pulse Rate: 68 SpO2: 97 %   Body Composition  Body Fat %: 46 % Fat Mass (lbs): 92 lbs Muscle Mass (lbs): 102.4 lbs Total Body Water (lbs): 74.8 lbs Visceral Fat Rating : 15    HPI  Chief Complaint: OBESITY  Kristine Olson is here to discuss her progress with her obesity treatment plan. She is on the the Category 1 Plan and states she is following her eating plan approximately 50 % of the time. She states she is exercising 30 minutes 1-2 times per week.  Interval History:  Since last office visit she is up 2 lb Muscle mass is up 0.4 lb and body fat is up 2 lb She admits to getting off track over the holidays and has not been exercising quite as much She was previously doing [facility] program She is not tracking kcal intake  This gives her a net weight loss of 23 lb in the past 13 mos of medically supervised weight management She plans to have her husband go to the gym with her She was drinking more sweet tea and soda lately She is struggling to get in water  Pharmacotherapy: None  PHYSICAL EXAM:  Blood pressure 124/77, pulse 68, temperature 98.1 F (36.7 C), height 5\' 3"  (1.6 m), weight 199 lb (90.3 kg), SpO2 97%. Body mass index is 35.25 kg/m.  General: She is overweight, cooperative, alert, well developed, and in no acute distress. PSYCH: Has normal mood, affect and thought process.   Lungs: Normal breathing effort, no conversational dyspnea.   ASSESSMENT AND PLAN  TREATMENT PLAN FOR OBESITY:  Recommended Dietary Goals  Kristine Olson is currently in the action stage of change. As such, her goal is to continue weight management plan. She has agreed to keeping a food journal and adhering to recommended goals of 1200 calories and 80 g of  protein and practicing portion control and making smarter food choices, such as increasing vegetables and decreasing simple carbohydrates.  Behavioral Intervention  We discussed the following Behavioral Modification Strategies today: continue to work on maintaining a reduced calorie state, getting the recommended amount of protein, incorporating whole foods, making healthy choices, staying well hydrated and practicing mindfulness when eating..  Additional resources provided today: NA  Recommended Physical Activity Goals  Kristine Olson has been advised to work up to 150 minutes of moderate intensity aerobic activity a week and strengthening exercises 2-3 times per week for cardiovascular health, weight loss maintenance and preservation of muscle mass.   She has agreed to Think about enjoyable ways to increase daily physical activity and overcoming barriers to exercise and Increase physical activity in their day and reduce sedentary time (increase NEAT).  Pharmacotherapy changes for the treatment of obesity: None  ASSOCIATED CONDITIONS ADDRESSED TODAY  Pre-diabetes Assessment & Plan: Lab Results  Component Value Date   HGBA1C 5.8 (H) 10/10/2023   Stable.  Patient has had a stable A1c.  She has associated insulin resistance which has slightly improved.  She has been struggling more recently with comfort foods and sugar intake.  Her exercise has reduced over the winter months.  Her motivation has been waning as she has been part of our program for over 1 year.  Continue to focus on balancing intake of lean protein and  fiber with meals while minimizing intake of added sugar and refined carbohydrates. Recommend both cardio and resistance training exercise 2 to 3 days a week on a more consistent basis   Class 2 obesity due to excess calories with body mass index (BMI) of 35.0 to 35.9 in adult, unspecified whether serious comorbidity present  Vitamin D deficiency Assessment & Plan: Last vitamin  D Lab Results  Component Value Date   VD25OH 35.2 10/10/2023   Her last vitamin D level and did reduce.  She is taking over-the-counter vitamin D.  Aim for vitamin D at 2000 IU once daily in addition to a multivitamin daily.  Bone density testing due every 2 years.         She was informed of the importance of frequent follow up visits to maximize her success with intensive lifestyle modifications for her multiple health conditions.   ATTESTASTION STATEMENTS:  Reviewed by clinician on day of visit: allergies, medications, problem list, medical history, surgical history, family history, social history, and previous encounter notes pertinent to obesity diagnosis.   I have personally spent 30 minutes total time today in preparation, patient care, nutritional counseling and documentation for this visit, including the following: review of clinical lab tests; review of medical tests/procedures/services.      Glennis Brink, DO DABFM, DABOM [facility] [address] [place], [place] [address] [phone]

## 2023-11-22 ENCOUNTER — Encounter: Payer: Self-pay | Admitting: Family Medicine

## 2023-11-22 ENCOUNTER — Ambulatory Visit: Payer: Medicare PPO | Admitting: Family Medicine

## 2023-11-22 VITALS — BP 104/64 | HR 78 | Temp 98.8°F | Ht 63.0 in | Wt 205.1 lb

## 2023-11-22 DIAGNOSIS — K130 Diseases of lips: Secondary | ICD-10-CM

## 2023-11-22 MED ORDER — MUPIROCIN 2 % EX OINT: 1.0000 | TOPICAL_OINTMENT | Freq: Two times a day (BID) | CUTANEOUS | 0 refills | Status: DC

## 2023-11-22 NOTE — Progress Notes (Signed)
   Subjective:    Patient ID: Marja Kays, female    DOB: 05-28-1946, 78 y.o.   MRN: [id number]  HPI Mouth sore- L side of mouth in the corner.  First appeared a few weeks ago.  At times she reports white blister like areas.  Mild tight feeling if attempting to open mouth wide.  No drainage.  There was initially pain at onset but pain has resolved.   Review of Systems For ROS see HPI     Objective:   Physical Exam Vitals reviewed.  Constitutional:      General: She is not in acute distress.    Appearance: Normal appearance. She is not ill-appearing.  HENT:     Head: Normocephalic and atraumatic.     Mouth/Throat:     Mouth: Mucous membranes are moist.     Pharynx: No oropharyngeal exudate or posterior oropharyngeal erythema.  Skin:    General: Skin is warm and dry.     Findings: Lesion (lesion just lateral to L angle of the mouth, firm, nontender, w/ visable white core and mild honey crust) present.  Neurological:     General: No focal deficit present.     Mental Status: She is alert and oriented to person, place, and time.  Psychiatric:        Mood and Affect: Mood normal.        Behavior: Behavior normal.           Assessment & Plan:  Cyst of lip- new.  Pt is upset about this cosmetically but it doesn't cause any pain.  Discussed that this seems to be some kind of cyst rather than a pimple or HSV lesion b/c it is painless.  She consented to unroof the lesion.  Area was prepped w/ alcohol pad, 23 gauge needle used to slide under the top of lesion, core was expressed w/ gentle pressure.  Pt tolerated w/o difficulty.  Will treat w/ Mupirocin.  Instructed to use hot compresses.  Placed derm referral in case area recurs.  Pt expressed understanding and is in agreement w/ plan.

## 2023-11-22 NOTE — Patient Instructions (Signed)
Follow up as needed or as scheduled Apply the Mupirocin ointment twice daily until the area heals USE warm compresses to bring any remaining fluid to a head If the area resolves, you can cancel the derm appt Call with any questions or concerns Happy New Year!

## 2023-11-28 DIAGNOSIS — F411 Generalized anxiety disorder: Secondary | ICD-10-CM | POA: Diagnosis not present

## 2023-11-29 ENCOUNTER — Ambulatory Visit: Payer: Medicare PPO | Admitting: Dermatology

## 2023-11-29 ENCOUNTER — Encounter: Payer: Self-pay | Admitting: Dermatology

## 2023-11-29 VITALS — BP 111/73

## 2023-11-29 DIAGNOSIS — L720 Epidermal cyst: Secondary | ICD-10-CM | POA: Diagnosis not present

## 2023-11-29 DIAGNOSIS — L723 Sebaceous cyst: Secondary | ICD-10-CM

## 2023-11-29 MED ORDER — DOXYCYCLINE HYCLATE 100 MG PO TABS: 100.0000 mg | ORAL_TABLET | Freq: Two times a day (BID) | ORAL | 0 refills | Status: AC

## 2023-11-29 MED ORDER — TRIAMCINOLONE ACETONIDE 10 MG/ML IJ SUSP
10.0000 mg | Freq: Once | INTRAMUSCULAR | Status: AC
  Administered 2023-11-29: 10 mg

## 2023-11-29 NOTE — Patient Instructions (Addendum)
 Hi Lib[place],  Thank you for visiting our clinic today.    Here is a summary of the key instructions and recommendations from your visit with Dr. Delon Lenis:  Kenalog  Injection: You received an injection of Kenalog  to help reduce the size of the cyst. This should take effect within 48 hours. Please avoid touching the treated area to prevent irritation.  Oral Doxycycline : A 5-day course of oral doxycycline  has been prescribed to combat bacteria and reduce inflammation. Remember to take this medication with a substantial meal (breakfast or dinner) to minimize stomach upset.  Follow-Up: We will monitor the progress of the treatment, and a follow-up appointment may be necessary if the cyst recurs. Surgery might be considered as a further step.  Skin Check: Given your family history of melanoma, it is advised to schedule a routine skin check. This can be arranged at your convenience with either Dr. Lenis or Dr. Corey.  General Advice: Continue using the new [employer] product as previously discussed, unless you experience any issues.  Please feel free to reach out via MyChart if you have any questions or concerns regarding your treatment or follow-up care.  Best regards,  Dr. Delon Lenis Dermatology Important Information  Due to recent changes in healthcare laws, you may see results of your pathology and/or laboratory studies on MyChart before the doctors have had a chance to review them. We understand that in some cases there may be results that are confusing or concerning to you. Please understand that not all results are received at the same time and often the doctors may need to interpret multiple results in order to provide you with the best plan of care or course of treatment. Therefore, we ask that you please give us  2 business days to thoroughly review all your results before contacting the office for clarification. Should we see a critical lab result, you will be contacted sooner.   If  You Need Anything After Your Visit  If you have any questions or concerns for your doctor, please call our main line at [phone] If no one answers, please leave a voicemail as directed and we will return your call as soon as possible. Messages left after 4 pm will be answered the following business day.   You may also send us  a message via MyChart. We typically respond to MyChart messages within 1-2 business days.  For prescription refills, please ask your pharmacy to contact our office. Our fax number is [phone].  If you have an urgent issue when the clinic is closed that cannot wait until the next business day, you can page your doctor at the number below.    Please note that while we do our best to be available for urgent issues outside of office hours, we are not available 24/7.   If you have an urgent issue and are unable to reach us , you may choose to seek medical care at your doctor's office, retail clinic, urgent care center, or emergency room.  If you have a medical emergency, please immediately call 911 or go to the emergency department. In the event of inclement weather, please call our main line at [phone] for an update on the status of any delays or closures.  Dermatology Medication Tips: Please keep the boxes that topical medications come in in order to help keep track of the instructions about where and how to use these. Pharmacies typically print the medication instructions only on the boxes and not directly on the medication tubes.  If your medication is too expensive, please contact our office at [phone] or send us  a message through MyChart.   We are unable to tell what your co-pay for medications will be in advance as this is different depending on your insurance coverage. However, we may be able to find a substitute medication at lower cost or fill out paperwork to get insurance to cover a needed medication.   If a prior authorization is required to get  your medication covered by your insurance company, please allow us  1-2 business days to complete this process.  Drug prices often vary depending on where the prescription is filled and some pharmacies may offer cheaper prices.  The website www.goodrx.com contains coupons for medications through different pharmacies. The prices here do not account for what the cost may be with help from insurance (it may be cheaper with your insurance), but the website can give you the price if you did not use any insurance.  - You can print the associated coupon and take it with your prescription to the pharmacy.  - You may also stop by our office during regular business hours and pick up a GoodRx coupon card.  - If you need your prescription sent electronically to a different pharmacy, notify our office through [facility] or by phone at [phone]

## 2023-11-29 NOTE — Progress Notes (Signed)
   New Patient Visit   Subjective  RYLAH FUKUDA is a 78 y.o. female who presents for the following: New Pt - Lip Cyst  Patient states she has cyst located at the mouth that she would like to have examined. Patient reports the areas have been there for 2 weeks. She reports the areas are not bothersome.Patient rates irritation 0 out of 10. She states that the areas have not spread. Patient reports she has previously been treated for these areas by PCP. Rx Mupirocin  ointment that she has been using about a week. Patient reports Hx of bx. Patient reports family history of skin cancer(s) (brother - melanoma, paternal uncle - unsure of type)   The following portions of the chart were reviewed this encounter and updated as appropriate: medications, allergies, medical history  Review of Systems:  No other skin or systemic complaints except as noted in HPI or Assessment and Plan.  Objective  Well appearing patient in no apparent distress; mood and affect are within normal limits.   A focused examination was performed of the following areas: lip   Relevant exam findings are noted in the Assessment and Plan.          Left Oral Commissure Procedure Note Intralesional Injection  Location: left oral commisure  Informed Consent: Discussed risks (infection, pain, bleeding, bruising, thinning of the skin, loss of skin pigment, lack of resolution, and recurrence of lesion) and benefits of the procedure, as well as the alternatives. Informed consent was obtained. Preparation: The area was prepared a standard fashion.  Anesthesia: n/a  Procedure Details: An intralesional injection was performed with Kenalog  10 mg/cc. 0.05 cc in total were injected. NDC #: 9996-9505-79 Exp: 11/2024  Total number of injections: 1  Plan: The patient was instructed on post-op care. Recommend OTC analgesia as needed for pain.   Assessment & Plan   EPIDERMAL INCLUSION CYST Exam: 6mm subcutaneous cystic nodule  at left oral commissure   Benign-appearing. Exam most consistent with an epidermal inclusion cyst. Discussed that a cyst is a benign growth that can grow over time and sometimes get irritated or inflamed. Recommend observation if it is not bothersome. Discussed option of surgical excision to remove it if it is growing, symptomatic, or other changes noted. Please call for new or changing lesions so they can be evaluated.   -Rx Doxycycline  100mg  - take BID for 5 days. Take with food.  INFLAMED EPIDERMOID CYST OF SKIN Left Oral Commissure Related Medications triamcinolone  acetonide (KENALOG ) 10 MG/ML injection 10 mg   Return for TBSE.    Documentation: I have reviewed the above documentation for accuracy and completeness, and I agree with the above.   I, Shirron Maranda, CMA, am acting as scribe for [employer], DO.   Delon Lenis, DO

## 2023-12-27 DIAGNOSIS — E039 Hypothyroidism, unspecified: Secondary | ICD-10-CM | POA: Diagnosis not present

## 2023-12-27 DIAGNOSIS — G4733 Obstructive sleep apnea (adult) (pediatric): Secondary | ICD-10-CM | POA: Diagnosis not present

## 2023-12-27 DIAGNOSIS — I10 Essential (primary) hypertension: Secondary | ICD-10-CM | POA: Diagnosis not present

## 2023-12-27 DIAGNOSIS — H25811 Combined forms of age-related cataract, right eye: Secondary | ICD-10-CM | POA: Diagnosis not present

## 2024-01-07 ENCOUNTER — Encounter: Payer: Self-pay | Admitting: Family Medicine

## 2024-01-07 ENCOUNTER — Ambulatory Visit: Payer: Medicare PPO | Admitting: Family Medicine

## 2024-01-07 ENCOUNTER — Telehealth: Payer: Self-pay

## 2024-01-07 ENCOUNTER — Ambulatory Visit: Admitting: Family Medicine

## 2024-01-07 VITALS — BP 110/70 | HR 78 | Temp 98.7°F | Wt 205.5 lb

## 2024-01-07 DIAGNOSIS — H66003 Acute suppurative otitis media without spontaneous rupture of ear drum, bilateral: Secondary | ICD-10-CM

## 2024-01-07 DIAGNOSIS — M791 Myalgia, unspecified site: Secondary | ICD-10-CM | POA: Diagnosis not present

## 2024-01-07 LAB — CBC WITH DIFFERENTIAL/PLATELET
Basophils Absolute: 0.1 10*3/uL (ref 0.0–0.1)
Basophils Relative: 0.9 % (ref 0.0–3.0)
Eosinophils Absolute: 0.4 10*3/uL (ref 0.0–0.7)
Eosinophils Relative: 4.5 % (ref 0.0–5.0)
HCT: 37.1 % (ref 36.0–46.0)
Hemoglobin: 12.1 g/dL (ref 12.0–15.0)
Lymphocytes Relative: 11.1 % — ABNORMAL LOW (ref 12.0–46.0)
Lymphs Abs: 0.9 10*3/uL (ref 0.7–4.0)
MCHC: 32.7 g/dL (ref 30.0–36.0)
MCV: 86 fl (ref 78.0–100.0)
Monocytes Absolute: 0.6 10*3/uL (ref 0.1–1.0)
Monocytes Relative: 7.7 % (ref 3.0–12.0)
Neutro Abs: 6.1 10*3/uL (ref 1.4–7.7)
Neutrophils Relative %: 75.8 % (ref 43.0–77.0)
Platelets: 283 10*3/uL (ref 150.0–400.0)
RBC: 4.31 Mil/uL (ref 3.87–5.11)
RDW: 14.3 % (ref 11.5–15.5)
WBC: 8.1 10*3/uL (ref 4.0–10.5)

## 2024-01-07 LAB — CK: Total CK: 51 U/L (ref 7–177)

## 2024-01-07 MED ORDER — AZITHROMYCIN 250 MG PO TABS: ORAL_TABLET | ORAL | 0 refills | Status: DC

## 2024-01-07 NOTE — Telephone Encounter (Signed)
-----   Message from Neena Rhymes sent at 01/07/2024  2:32 PM EDT ----- Thankfully your labs look good!  No evidence of muscle breakdown.  This is wonderful news!  Feel better soon!!

## 2024-01-07 NOTE — Telephone Encounter (Signed)
 Lab results have been discussed.   Verbalized understanding? Yes  Are there any questions? No

## 2024-01-07 NOTE — Progress Notes (Unsigned)
   Subjective:    Patient ID: Kristine Olson, female    DOB: [birth year], 78 y.o.   MRN: [id number]  HPI URI- pt reports HA, sinus pressure, jaw pain (particularly w/ chewing), tooth pain.  Having muscle pain 'all over'.  No known fever.  + fatigue.  Occasional cough.  Bilateral ear fullness.  Sxs started ~2 weeks ago.     Review of Systems For ROS see HPI     Objective:   Physical Exam Vitals reviewed.  Constitutional:      General: She is not in acute distress.    Appearance: She is well-developed. She is obese. She is not ill-appearing.  HENT:     Head: Normocephalic and atraumatic.     Right Ear: A middle ear effusion is present. Tympanic membrane is erythematous and bulging.     Left Ear: A middle ear effusion is present. Tympanic membrane is erythematous and bulging.     Nose: Congestion present. No rhinorrhea.     Comments: No TTP over frontal or maxillary sinuses    Mouth/Throat:     Mouth: Mucous membranes are moist.  Eyes:     General: No scleral icterus.    Extraocular Movements: Extraocular movements intact.  Cardiovascular:     Rate and Rhythm: Normal rate and regular rhythm.  Pulmonary:     Effort: Pulmonary effort is normal. No respiratory distress.     Breath sounds: No wheezing or rhonchi.  Musculoskeletal:        General: Tenderness (diffuse TTP over muscles- particularly quads and upper arms) present.     Cervical back: Neck supple.  Lymphadenopathy:     Cervical: No cervical adenopathy.  Skin:    General: Skin is warm and dry.     Findings: No rash.  Neurological:     General: No focal deficit present.     Mental Status: She is alert and oriented to person, place, and time.  Psychiatric:        Mood and Affect: Mood normal.        Behavior: Behavior normal.        Thought Content: Thought content normal.           Assessment & Plan:  Bilateral OM- new.  Pt w/ opaque fluid behind TM's bilaterally.  This could account for her jaw pain.  Due  to PCN allergy will start Zpack.  Reviewed supportive care and red flags that should prompt return.  Pt expressed understanding and is in agreement w/ plan.   Myalgia- new.  We have seen increased incidence of post-viral rhabdo.  Since pt has had sxs x2 weeks, unclear if she had preceding viral illness.  Will check CK and CBC.  Encouraged increased water intake.  Pt expressed understanding and is in agreement w/ plan.

## 2024-01-07 NOTE — Patient Instructions (Signed)
 Follow up as needed or as scheduled START the Zpack as directed- 2 today and then 1 daily Drink LOTS of fluids REST!!! Call with any questions or concerns Stay Safe!  Stay Healthy! Hang in there!!

## 2024-01-22 ENCOUNTER — Ambulatory Visit: Payer: Self-pay

## 2024-01-22 NOTE — Telephone Encounter (Signed)
 Chief Complaint: joint pain, body aches Symptoms: joint pain, body aches, ear fullness, nasal congestion Frequency: 3 wks Pertinent Negatives: Patient denies fever, N/V, CP, SOB Disposition: [] ED /[] Urgent Care (no appt availability in office) / [x] Appointment(In office/virtual)/ []  [place] [facility]/ [] Home Care/ [] Refused Recommended Disposition /[] [place] [facility]/ []  Follow-up with PCP Additional Notes: Pt reports joint pain and body aches in her shoulders, arms, hands, and occasionally in her legs. Rates the 4-5/10 and states it is intermittent. Pt was seen 2 wks ago and had the same symptoms. Labs were performed and her CK was normal. At that time pt also had symptoms of a sinus infection and was prescribed a Z pack which she has finished. Pt states she feels mostly better but she still has a feeling or ear fullness and an intermittent sore throat. Pt was supposed to have cataract surgery 2 wks ago but it was rescheduled d/t her illness. Pt is now scheduled to have cataract surgery this Thurdsday. Pt wants her ears looked at to ensure it is OK she has surgery. RN scheduled pt for 0900 tomorrow, pt agreeable to that plan. RN advised pt if she worsens to call us back, but if she develops worsening, severe pain, one-sided weakness, difficulty walking, CP, or SOB that she needs to call 911. Pt verbalized understanding.     Reason for Disposition  [1] MODERATE pain (e.g., interferes with normal activities) AND [2] present > 3 days  Answer Assessment - Initial Assessment Questions 1. ONSET: "When did the muscle aches or body pains start?"      3-4 wks 2. LOCATION: "What part of your body is hurting?" (e.g., entire body, arms, legs)      Arms, shoulders, hands, occasionally legs 3. SEVERITY: "How bad is the pain?" (Scale 1-10; or mild, moderate, severe)   - MILD (1-3): doesn't interfere with normal activities    - MODERATE (4-7): interferes with normal activities or awakens from sleep     - SEVERE (8-10):  excruciating pain, unable to do any normal activities      "When I saw her two weeks ago I could almost cry, it is a 4 or 5 now", "I wish someone was massaging my arms" (pain is intermittent) 4. CAUSE: "What do you think is causing the pains?"     Not sure 5. FEVER: "Have you been having fever?"     99.518F before she saw Tabori in th office, 99.518F a few days ago 6. OTHER SYMPTOMS: "Do you have any other symptoms?" (e.g., chest pain, weakness, rash, cold or flu symptoms, weight loss)     Seen 2 wks ago, prescribed Z pack for sinus infection. Pt reports feeling of fluid in the ears and a sore throat sometimes with nasal congestion. Pt endorses "a little hearing" loss in the ears but also states she wears hearing aids - more muffled in the R ear. No rash. Denies weakness/numbness, maybe "tingling a couple times." No drainage from her ears.  "Last 2-3 days cough is not severe by any means, nasal drainage down my throat." Pt states she was supposed to have cataract surgery 2 wks ago, rescheduled for Thursday.  Protocols used: Muscle Aches and Body Pain-A-AH

## 2024-01-23 ENCOUNTER — Encounter: Payer: Self-pay | Admitting: Family Medicine

## 2024-01-23 ENCOUNTER — Ambulatory Visit: Admitting: Family Medicine

## 2024-01-23 VITALS — BP 126/70 | HR 82 | Temp 97.9°F | Ht 63.0 in | Wt 205.2 lb

## 2024-01-23 DIAGNOSIS — M791 Myalgia, unspecified site: Secondary | ICD-10-CM

## 2024-01-23 DIAGNOSIS — M26629 Arthralgia of temporomandibular joint, unspecified side: Secondary | ICD-10-CM | POA: Diagnosis not present

## 2024-01-23 DIAGNOSIS — J309 Allergic rhinitis, unspecified: Secondary | ICD-10-CM

## 2024-01-23 DIAGNOSIS — H6593 Unspecified nonsuppurative otitis media, bilateral: Secondary | ICD-10-CM

## 2024-01-23 MED ORDER — MOMETASONE FUROATE 50 MCG/ACT NA SUSP: 2.0000 | Freq: Every day | NASAL | 2 refills | Status: DC

## 2024-01-23 NOTE — Progress Notes (Signed)
 Subjective:  Patient ID: Kristine Olson, female    DOB: [birth year]  Age: 78 y.o. MRN: [id number]  CC:  Chief Complaint  Patient presents with   Generalized Body Aches    Pt notes some body aches, fluid feeling in her ears started with sxs two weeks ago, jaw hurts when chewing,     HPI Kristine Olson presents for  Acute visit for above, PCP Dr. Beverely Low.  Body aches, fluid feeling in ears, jaw pain:  On chart review, was seen by her primary care provider on March 17.  At that time was seen for upper respiratory infection symptoms with headache, sinus pressure, jaw pain and tooth pain, along with muscle pain all over.  Occasional cough with bilateral ear fullness.  She was noted to have a pink fluid behind her TMs bilaterally which possibly could be attributing to her jaw pain.  She was started on Z-Pak due to penicillin allergy.  CBC was reassuring CPK was normal, checked for possible postviral rhabdo.  Increased water intake was recommended.  Today, reports similar symptoms. Better since taking antibiotic. Sinus HA resolved, pressure in sinuses better. Still some congestion in - occasionally pops open and clears - right with usual symptoms, sometimes on left.  Jaw pain with chewing has improved, just not resolved.  Still having some body aches - upper arm and forearm both sides, both legs on occasion.   Planned cataract surgery tomorrow. No recent measured fever - 99.5 about 4 days ago.  Night sweats prior to last visit with Dr. Beverely Low, only 1 mild episode since last visit with Dr. Beverely Low.  Does know she could drink more water. Has been followed by weight mgt - on caltrate D past several weeks instead of gel caps for vit D and now otc Vit B few times per week.  Less exercise past few months - prior gym 2x/week, none since holidays.   Hx of allergies. Chronic nasal congestion, no recent nasal sprays. Only on zyrtec every day.  Mom with Rheumatoid arthritis - no joint swelling.   Dtr with MS - diagnosed in late 30's. Denies weakness, numbness.  Aunt with ALS.   Hx of GERd with remote hx of bleeding ulcer years ago - no nsaids. Takes tylenol at times. 2 per day now - prior 6 per day.   On celexa for depression - followed by psychiatry.  Felt weird on advair, but has done ok with nasal spray in past.    History Patient Active Problem List   Diagnosis Date Noted   Morbid obesity (HCC) with starting BMI 39 07/03/2023   B12 deficiency 10/26/2022   Insulin resistance 10/26/2022   Pre-diabetes 10/26/2022   Other fatigue 10/12/2022   SOBOE (shortness of breath on exertion) 10/12/2022   Depression screen 10/12/2022   Essential hypertension 09/11/2022   OSA on CPAP 09/11/2022   Anemia, iron deficiency 05/29/2016   OA (osteoarthritis) of knee 05/22/2016   Physical exam 04/17/2016   Hemorrhoids 12/03/2015   Elevated serum creatinine 11/27/2014   Positional vertigo 10/18/2012   Fasting hyperglycemia 08/23/2012   Vitamin D deficiency 08/23/2012   Osteopenia 07/06/2011   Menopausal and postmenopausal disorder 03/01/2010   GERD (gastroesophageal reflux disease) 03/05/2009   Hypothyroidism 11/20/2007   IBS 11/20/2007   History of cardiovascular disorder 11/20/2007   History of colonic polyps 11/20/2007   Past Medical History:  Diagnosis Date   Acute pyloric channel ulcer with hemorrhage on Xarelto    Anemia  history of anemia after bleeding ulcer   Arthritis    ostearthritis - right  medial knee   Back pain    Cataract    beginning cataracts   Complication of anesthesia    was given a medication while waiting to go to surgery that caused burning sensation on scalp was given another medication and the burning went away   COVID-19 2022   Depression    Edema    GERD (gastroesophageal reflux disease)    "heartburn daily"-TUMS helpful   Hemorrhoid    History of blood transfusion    2 units PRBC   History of stomach ulcers    Hx of sinus tachycardia     rarely any problems since metoprolol use   Hyperlipidemia    patient unaware, has never taking medication   Hypertension    Hypothyroidism    Joint pain    Osteoporosis    osteopenia   Sleep apnea    uses c pap   SOB (shortness of breath)    Varicose vein of leg    Vitamin D deficiency    Past Surgical History:  Procedure Laterality Date   BIOPSY BREAST     2 biopsies & 2 aspirations   CESAREAN SECTION     CLOSED REDUCTION TOE FRACTURE     COLONOSCOPY  2015   COLONOSCOPY W/ POLYPECTOMY  multiple since 2007   adenomas, diverticulosis;last 2015   ESOPHAGOGASTRODUODENOSCOPY (EGD) WITH PROPOFOL N/A 05/29/2016   Procedure: ESOPHAGOGASTRODUODENOSCOPY (EGD) WITH PROPOFOL;  Surgeon: Iva Boop, MD;  Location: [facility];  Service: Endoscopy;  Laterality: N/A;   HAMMER TOE SURGERY     PARTIAL KNEE ARTHROPLASTY Right 05/22/2016   Procedure: RIGHT KNEE MEDIAL UNICOMPARTMENTAL ARTHROPLASTY;  Surgeon: Ollen Gross, MD;  Location: [facility];  Service: Orthopedics;  Laterality: Right;   TONSILLECTOMY AND ADENOIDECTOMY     TOTAL KNEE ARTHROPLASTY Left 08/27/2017   Procedure: LEFT TOTAL KNEE ARTHROPLASTY;  Surgeon: Ollen Gross, MD;  Location: [facility];  Service: Orthopedics;  Laterality: Left;   WISDOM TOOTH EXTRACTION     Allergies  Allergen Reactions   Cefaclor Rash    Because of a history of documented adverse serious drug reaction;Medi Alert bracelet  is recommended   Penicillins     REACTION: rash Because of a history of documented adverse serious drug reaction;Medi Alert bracelet  is recommended Has patient had a PCN reaction causing immediate rash, facial/tongue/throat swelling, SOB or lightheadedness with hypotension: no Has patient had a PCN reaction causing severe rash involving mucus membranes or skin necrosis: {no Has patient had a PCN reaction that required hospitalization no Has patient had a PCN reaction occurring within the last 10 years: no If all of the above ans    Sulfa Antibiotics Rash    01/22/13 after 9 days of Sulfa Because of a history of documented adverse serious drug reaction;Medi Alert bracelet  is recommended   Advair Diskus [Fluticasone-Salmeterol]     "Felt weird" and flushing.   Prior to Admission medications   Medication Sig Start Date End Date Taking? Authorizing Provider  azithromycin (ZITHROMAX) 250 MG tablet 2 tabs on day 1, 1 tab on day 2-5 01/07/24  Yes Sheliah Hatch, MD  cetirizine (ZYRTEC) 10 MG tablet Take 10 mg by mouth daily as needed for allergies.   Yes [provider]  citalopram (CELEXA) 20 MG tablet Take 20 mg by mouth daily.   Yes [provider]  FLUAD 0.5 ML injection  08/22/23  Yes [provider]  levothyroxine (SYNTHROID) 112 MCG tablet TAKE 1 TABLET DAILY EXCEPT FOR 1 AND 1/2 TABLETS ON [facility] 04/12/23  Yes Sheliah Hatch, MD  metoprolol tartrate (LOPRESSOR) 25 MG tablet TAKE 1/2 TABLET TWICE A DAY BY MOUTH 11/12/23  Yes Sheliah Hatch, MD  Multiple Vitamins-Minerals (PRESERVISION AREDS 2+MULTI VIT PO) Take by mouth.   Yes [provider]  mupirocin ointment (BACTROBAN) 2 % Apply 1 Application topically 2 (two) times daily. 11/22/23  Yes Sheliah Hatch, MD  pantoprazole (PROTONIX) 20 MG tablet TAKE 1 TABLET BY MOUTH EVERY DAY 11/13/23  Yes Sheliah Hatch, MD  spironolactone (ALDACTONE) 25 MG tablet TAKE 1 TABLET BY MOUTH EVERY DAY 11/12/23  Yes Sheliah Hatch, MD   Social History   Socioeconomic History   Marital status: Married    Spouse name: Not on file   Number of children: 3   Years of education: Not on file   Highest education level: Master's degree (e.g., MA, MS, MEng, MEd, MSW, MBA)  Occupational History   Occupation: Retired  Tobacco Use   Smoking status: Never   Smokeless tobacco: Never  Vaping Use   Vaping status: Never Used  Substance and Sexual Activity   Alcohol use: Yes    Alcohol/week: 0.0 standard drinks of alcohol     Comment:  very rarely   Drug use: No   Sexual activity: Yes    Birth control/protection: Post-menopausal  Other Topics Concern   Not on file  Social History Narrative   3 biological children (Set of Identical twins)    3 Step children    Social Drivers of Health   Financial Resource Strain: Low Risk  (01/22/2024)   Overall Financial Resource Strain (CARDIA)    Difficulty of Paying Living Expenses: Not hard at all  Food Insecurity: No Food Insecurity (01/22/2024)   Hunger Vital Sign    Worried About Running Out of Food in the Last Year: Never true    Ran Out of Food in the Last Year: Never true  Transportation Needs: No Transportation Needs (01/22/2024)   PRAPARE - Administrator, Civil Service (Medical): No    Lack of Transportation (Non-Medical): No  Physical Activity: Insufficiently Active (01/22/2024)   Exercise Vital Sign    Days of Exercise per Week: 2 days    Minutes of Exercise per Session: 30 min  Stress: No Stress Concern Present (01/22/2024)   [employer] of Occupational Health - Occupational Stress Questionnaire    Feeling of Stress : Not at all  Social Connections: Socially Integrated (01/22/2024)   Social Connection and Isolation Panel [NHANES]    Frequency of Communication with Friends and Family: Three times a week    Frequency of Social Gatherings with Friends and Family: Twice a week    Attends Religious Services: More than 4 times per year    Active Member of [employer] or Organizations: Yes    Attends Engineer, structural: More than 4 times per year    Marital Status: Married  Catering manager Violence: Not At Risk (01/25/2023)   Humiliation, Afraid, Rape, and Kick questionnaire    Fear of Current or Ex-Partner: No    Emotionally Abused: No    Physically Abused: No    Sexually Abused: No    Review of Systems Per HPI  Objective:   Vitals:   01/23/24 0902  BP: 126/70  Pulse: 82  Temp: 97.9 F (36.6 C)  TempSrc: Temporal  SpO2: 96%   Weight: 205 lb 3.2 oz (93.1 kg)  Height: 5\' 3"  (1.6 m)     Physical Exam Vitals reviewed.  Constitutional:      General: She is not in acute distress.    Appearance: She is well-developed.  HENT:     Head: Normocephalic and atraumatic.     Right Ear: Hearing, ear canal and external ear normal.     Left Ear: Hearing, ear canal and external ear normal.     Ears:     Comments: Effusion with clear fluid behind right greater than left TM. No erythema, rupture, or canal edema/erythema.   Popping, slick with slight discomfort at TMJ R greater than left - reproduces symptoms.     Nose: Nose normal.     Comments: Sinuses nontender.    Mouth/Throat:     Pharynx: No posterior oropharyngeal erythema.  Eyes:     Conjunctiva/sclera: Conjunctivae normal.     Pupils: Pupils are equal, round, and reactive to light.  Cardiovascular:     Rate and Rhythm: Normal rate and regular rhythm.     Heart sounds: Normal heart sounds. No murmur heard. Pulmonary:     Effort: Pulmonary effort is normal. No respiratory distress.     Breath sounds: Normal breath sounds. No wheezing or rhonchi.  Musculoskeletal:     Comments: No focal tenderness of upper extremities, lower extremities, and reports proximal muscles actually feels better.  Ambulating without difficulty, equal use of upper extremity and lower extremities without deficit appreciated.  Skin:    General: Skin is warm and dry.     Findings: No rash.  Neurological:     Mental Status: She is alert and oriented to person, place, and time.  Psychiatric:        Mood and Affect: Mood normal.        Behavior: Behavior normal.       Assessment & Plan:  CHARLEEN MADERA is a 78 y.o. female . Bilateral otitis media with effusion - Plan: mometasone (NASONEX) 50 MCG/ACT nasal spray Allergic rhinitis, unspecified seasonality, unspecified trigger - Plan: mometasone (NASONEX) 50 MCG/ACT nasal spray  -Otitis media with effusion based on exam,  improvement in symptoms since antibiotic treatment, prior eval with PCP.  Does not appear to be infected at this time.  May have component of allergic rhinitis that is also contributing and discussed trial of Nasonex for allergies, eustachian tube dysfunction contributor.  Consider ENT eval if persistent, RTC precautions, continue monitoring this time with follow-up with PCP in the next few weeks.  TMJ syndrome  -Based on location, symptoms appears to be TMJ syndrome, bilateral, but overall symptoms have improved.  Hold on NSAIDs given her GI history.  Handout given, RTC precautions.  Myalgia  -Question previous viral illness with secondary myalgias plus or minus component of deconditioning with decreased exercise.  Recent CPK reassuring.  Reassuring exam.  As above we will have her follow-up with PCP to decide if further testing needed at that time but will continue to monitor for the next few weeks.  RTC precautions if new or worsening symptoms.  Meds ordered this encounter  Medications   mometasone (NASONEX) 50 MCG/ACT nasal spray    Sig: Place 2 sprays into the nose daily.    Dispense:  17 g    Refill:  2   Patient Instructions  See information below on fluid behind the ears.  Continue Zyrtec, the addition of steroid nasal spray may help allergies as  well as some of the congestion behind the ears.  That can take some time but if not improving can meet with ENT, let us know.  I will have you follow-up with your primary care provider in the next few weeks to follow-up on your ears as well as other concerns from today.  Pain in the jaw appears to be at the TMJ or joint of the jaw.  See information below.  With your gastrointestinal history, I do not recommend NSAIDs at this time but okay to continue Tylenol.  Muscle aches could be related to previous viral infection, or some deconditioning with less exercise past few months.  Start low intensity exercise like walking, gentle range of motion and  muscle movements.  As we discussed some of those can be done with home exercises if you are concerned about going into the gym.  Test from Dr. Beverely Low of the muscles was reassuring as well as her exam today.  Tylenol is fine if needed for now but if not improving I do recommend discussing further with Dr. Beverely Low.  If persistent aches in the muscles, you could discuss other medications such as Cymbalta with your psychiatrist, but I would recommend giving this some more time for now.  Hope you feel better soon.  Let us know if anything changes and hang in there!  Temporomandibular Joint Syndrome  Temporomandibular joint syndrome (TMJ syndrome) is a condition that causes pain in the temporomandibular joints. These joints are located near your ears and allow your jaw to open and close. For people with TMJ syndrome, chewing, biting, or other movements of the jaw can be difficult or painful. TMJ syndrome is often mild and goes away within a few weeks. However, sometimes the condition becomes a long-term (chronic) problem. What are the causes? This condition may be caused by: Grinding your teeth or clenching your jaw. Some people do this when they are stressed. Arthritis. An injury to the jaw. A head or neck injury. Teeth or dentures that are not aligned well. In some cases, the cause of TMJ syndrome may not be known. What are the signs or symptoms? The most common symptom of this condition is aching pain on the side of the head in the area of the TMJ. Other symptoms may include: Pain when moving your jaw, such as when chewing or biting. Not being able to open your jaw all the way. Making a clicking sound when you open your mouth. Headache. Earache. Neck or shoulder pain. How is this diagnosed? This condition may be diagnosed based on: Your symptoms and medical history. A physical exam. Your health care provider may check the range of motion of your jaw. Imaging tests, such as X-rays or an  MRI. You may also need to see your dentist, who will check if your teeth and jaw are lined up correctly. How is this treated? TMJ syndrome often goes away on its own. If treatment is needed, it may include: Eating soft foods and applying ice or heat. Medicines to relieve pain or inflammation. Medicines or massage to relax the muscles. A splint, bite plate, or mouthpiece to prevent teeth grinding or jaw clenching. Relaxation techniques or counseling to help reduce stress. A therapy for pain in which an electrical current is applied to the nerves through the skin (transcutaneous electrical nerve stimulation). Acupuncture. This may help to relieve pain. Jaw surgery. This is rarely needed. Follow these instructions at home:  Eating and drinking Eat a soft diet if you are  having trouble chewing. Avoid foods that require a lot of chewing. Do not chew gum. General instructions Take over-the-counter and prescription medicines only as told by your health care provider. If directed, put ice on the painful area. To do this: Put ice in a plastic bag. Place a towel between your skin and the bag. Leave the ice on for 20 minutes, 2-3 times a day. Remove the ice if your skin turns bright red. This is very important. If you cannot feel pain, heat, or cold, you have a greater risk of damage to the area. Apply a warm, wet cloth (warm compress) to the painful area as told. Massage your jaw area and do any jaw stretching exercises as told by your health care provider. If you were given a splint, bite plate, or mouthpiece, wear it as told by your health care provider. Keep all follow-up visits. This is important. Where to find more information [employer] of Dental and Craniofacial Research: [employer] Contact a health care provider if: You have trouble eating. You have new or worsening symptoms. Get help right away if: Your jaw locks. Summary Temporomandibular joint syndrome (TMJ  syndrome) is a condition that causes pain in the temporomandibular joints. These joints are located near your ears and allow your jaw to open and close. TMJ syndrome is often mild and goes away within a few weeks. However, sometimes the condition becomes a long-term (chronic) problem. Symptoms include an aching pain on the side of the head in the area of the TMJ, pain when chewing or biting, and being unable to open your jaw all the way. You may also make a clicking sound when you open your mouth. TMJ syndrome often goes away on its own. If treatment is needed, it may include medicines to relieve pain, reduce inflammation, or relax the muscles. A splint, bite plate, or mouthpiece may also be used to prevent teeth grinding or jaw clenching. This information is not intended to replace advice given to you by your health care provider. Make sure you discuss any questions you have with your health care provider. Document Revised: 05/22/2021 Document Reviewed: 05/22/2021 Elsevier Patient Education  2024 Elsevier Inc.  Otitis Media With Effusion, Adult  Otitis media with effusion (OME) is inflammation and fluid (effusion) in the middle ear without having an ear infection. The middle ear is the space behind the eardrum. The middle ear is connected to the back of the throat by a narrow tube (eustachian tube). Normally the eustachian tube drains fluid out of the middle ear. A swollen eustachian tube can become blocked and cause fluid to collect in the middle ear. OME often goes away without treatment. Sometimes OME can lead to hearing problems and recurrent acute ear infections (acute otitis media). These conditions may require treatment. What are the causes? OME is caused by a blocked eustachian tube. This can result from: Allergies. Upper respiratory infections. Enlarged adenoids. The adenoids are areas of soft tissue located high in the back of the throat, behind the nose and the roof of the mouth. They  are part of the body's natural defense system (immune system). Rapid changes in pressure, like when an airplane is descending or during scuba diving. In some cases, the cause of this condition is not known. What are the signs or symptoms? Common symptoms of this condition include: A feeling of fullness in your ear. Decreased hearing in the affected ear. Fluid draining into the ear canal. Pain in the ear. In some cases, there are  no symptoms. How is this diagnosed?  A health care provider can diagnose OME based on signs and symptoms of the condition. Your provider will also do a physical exam to check for fluid behind the eardrum. During the exam, your health care provider will use an instrument called an otoscope to look in your ear. Your health care provider may do other tests, such as: A hearing test. A tympanogram. This is a test that shows how well the eardrum moves in response to air pressure in the ear canal. It provides a graph for your health care provider to review. A pneumatic otoscopy. This is a test to check how your eardrum moves in response to changes in pressure. It is done by squeezing a small amount of air into the ear. How is this treated? Treatment for OME depends on the cause of the condition and the severity of symptoms. The first step is often waiting to see if the fluid drains on its own in a few weeks. Home care treatment may include: Over-the-counter pain relievers. A warm, moist cloth placed over the ear. Severe cases may require a procedure to insert tubes in the ears (tympanostomy tubes) to drain the fluid. Follow these instructions at home: Take over-the-counter and prescription medicines only as told by your health care provider. Keep all follow-up visits. Contact a health care provider if: You have pain that gets worse. Hearing in your affected ear gets worse. You have fluid draining from your ear canal. You have dizziness. You develop a fever. Get help  right away if: You develop a severe headache. You completely lose hearing in the affected ear. You have bleeding from your ear canal. You have sudden and severe pain in your ear. These symptoms may represent a serious problem that is an emergency. Do not wait to see if the symptoms will go away. Get medical help right away. Call your local emergency services (911 in the [place] Do not drive yourself to the hospital. Summary Otitis media with effusion (OME) is inflammation and fluid (effusion) in the middle ear without having an ear infection. A swollen eustachian tube can become blocked and cause fluid to collect in the middle ear. Treatment for OME depends on the cause of the condition and the severity of symptoms. Many times, treatment is not needed because the fluid drains on its own in a few weeks. Sometimes OME can lead to hearing problems and recurrent acute ear infections (acute otitis media), which may require treatment. This information is not intended to replace advice given to you by your health care provider. Make sure you discuss any questions you have with your health care provider. Document Revised: 02/03/2021 Document Reviewed: 02/03/2021 Elsevier Patient Education  2024 Elsevier Inc.    Signed,   Meredith Staggers, MD [facility], [facility] 01/23/24 9:20 AM

## 2024-01-23 NOTE — Patient Instructions (Addendum)
 See information below on fluid behind the ears.  Continue Zyrtec, the addition of steroid nasal spray may help allergies as well as some of the congestion behind the ears.  That can take some time but if not improving can meet with ENT, let us know.  I will have you follow-up with your primary care provider in the next few weeks to follow-up on your ears as well as other concerns from today.  Pain in the jaw appears to be at the TMJ or joint of the jaw.  See information below.  With your gastrointestinal history, I do not recommend NSAIDs at this time but okay to continue Tylenol.  Muscle aches could be related to previous viral infection, or some deconditioning with less exercise past few months.  Start low intensity exercise like walking, gentle range of motion and muscle movements.  As we discussed some of those can be done with home exercises if you are concerned about going into the gym.  Test from Dr. Beverely Low of the muscles was reassuring as well as her exam today.  Tylenol is fine if needed for now but if not improving I do recommend discussing further with Dr. Beverely Low.  If persistent aches in the muscles, you could discuss other medications such as Cymbalta with your psychiatrist, but I would recommend giving this some more time for now.  Hope you feel better soon.  Let us know if anything changes and hang in there!  Temporomandibular Joint Syndrome  Temporomandibular joint syndrome (TMJ syndrome) is a condition that causes pain in the temporomandibular joints. These joints are located near your ears and allow your jaw to open and close. For people with TMJ syndrome, chewing, biting, or other movements of the jaw can be difficult or painful. TMJ syndrome is often mild and goes away within a few weeks. However, sometimes the condition becomes a long-term (chronic) problem. What are the causes? This condition may be caused by: Grinding your teeth or clenching your jaw. Some people do this when they  are stressed. Arthritis. An injury to the jaw. A head or neck injury. Teeth or dentures that are not aligned well. In some cases, the cause of TMJ syndrome may not be known. What are the signs or symptoms? The most common symptom of this condition is aching pain on the side of the head in the area of the TMJ. Other symptoms may include: Pain when moving your jaw, such as when chewing or biting. Not being able to open your jaw all the way. Making a clicking sound when you open your mouth. Headache. Earache. Neck or shoulder pain. How is this diagnosed? This condition may be diagnosed based on: Your symptoms and medical history. A physical exam. Your health care provider may check the range of motion of your jaw. Imaging tests, such as X-rays or an MRI. You may also need to see your dentist, who will check if your teeth and jaw are lined up correctly. How is this treated? TMJ syndrome often goes away on its own. If treatment is needed, it may include: Eating soft foods and applying ice or heat. Medicines to relieve pain or inflammation. Medicines or massage to relax the muscles. A splint, bite plate, or mouthpiece to prevent teeth grinding or jaw clenching. Relaxation techniques or counseling to help reduce stress. A therapy for pain in which an electrical current is applied to the nerves through the skin (transcutaneous electrical nerve stimulation). Acupuncture. This may help to relieve pain. Jaw surgery.  This is rarely needed. Follow these instructions at home:  Eating and drinking Eat a soft diet if you are having trouble chewing. Avoid foods that require a lot of chewing. Do not chew gum. General instructions Take over-the-counter and prescription medicines only as told by your health care provider. If directed, put ice on the painful area. To do this: Put ice in a plastic bag. Place a towel between your skin and the bag. Leave the ice on for 20 minutes, 2-3 times a  day. Remove the ice if your skin turns bright red. This is very important. If you cannot feel pain, heat, or cold, you have a greater risk of damage to the area. Apply a warm, wet cloth (warm compress) to the painful area as told. Massage your jaw area and do any jaw stretching exercises as told by your health care provider. If you were given a splint, bite plate, or mouthpiece, wear it as told by your health care provider. Keep all follow-up visits. This is important. Where to find more information [employer]: [employer] Contact a health care provider if: You have trouble eating. You have new or worsening symptoms. Get help right away if: Your jaw locks. Summary Temporomandibular joint syndrome (TMJ syndrome) is a condition that causes pain in the temporomandibular joints. These joints are located near your ears and allow your jaw to open and close. TMJ syndrome is often mild and goes away within a few weeks. However, sometimes the condition becomes a long-term (chronic) problem. Symptoms include an aching pain on the side of the head in the area of the TMJ, pain when chewing or biting, and being unable to open your jaw all the way. You may also make a clicking sound when you open your mouth. TMJ syndrome often goes away on its own. If treatment is needed, it may include medicines to relieve pain, reduce inflammation, or relax the muscles. A splint, bite plate, or mouthpiece may also be used to prevent teeth grinding or jaw clenching. This information is not intended to replace advice given to you by your health care provider. Make sure you discuss any questions you have with your health care provider. Document Revised: 05/22/2021 Document Reviewed: 05/22/2021 Elsevier Patient Education  2024 Else[employer] Inc.  Otitis Media With Effusion, Adult  Otitis media with effusion (OME) is inflammation and fluid (effusion) in the middle ear without  having an ear infection. The middle ear is the space behind the eardrum. The middle ear is connected to the back of the throat by a narrow tube (eustachian tube). Normally the eustachian tube drains fluid out of the middle ear. A swollen eustachian tube can become blocked and cause fluid to collect in the middle ear. OME often goes away without treatment. Sometimes OME can lead to hearing problems and recurrent acute ear infections (acute otitis media). These conditions may require treatment. What are the causes? OME is caused by a blocked eustachian tube. This can result from: Allergies. Upper respiratory infections. Enlarged adenoids. The adenoids are areas of soft tissue located high in the back of the throat, behind the nose and the roof of the mouth. They are part of the body's natural defense system (immune system). Rapid changes in pressure, like when an airplane is descending or during scuba diving. In some cases, the cause of this condition is not known. What are the signs or symptoms? Common symptoms of this condition include: A feeling of fullness in your ear. Decreased  hearing in the affected ear. Fluid draining into the ear canal. Pain in the ear. In some cases, there are no symptoms. How is this diagnosed?  A health care provider can diagnose OME based on signs and symptoms of the condition. Your provider will also do a physical exam to check for fluid behind the eardrum. During the exam, your health care provider will use an instrument called an otoscope to look in your ear. Your health care provider may do other tests, such as: A hearing test. A tympanogram. This is a test that shows how well the eardrum moves in response to air pressure in the ear canal. It provides a graph for your health care provider to review. A pneumatic otoscopy. This is a test to check how your eardrum moves in response to changes in pressure. It is done by squeezing a small amount of air into the  ear. How is this treated? Treatment for OME depends on the cause of the condition and the severity of symptoms. The first step is often waiting to see if the fluid drains on its own in a few weeks. Home care treatment may include: Over-the-counter pain relievers. A warm, moist cloth placed over the ear. Severe cases may require a procedure to insert tubes in the ears (tympanostomy tubes) to drain the fluid. Follow these instructions at home: Take over-the-counter and prescription medicines only as told by your health care provider. Keep all follow-up visits. Contact a health care provider if: You have pain that gets worse. Hearing in your affected ear gets worse. You have fluid draining from your ear canal. You have dizziness. You develop a fever. Get help right away if: You develop a severe headache. You completely lose hearing in the affected ear. You have bleeding from your ear canal. You have sudden and severe pain in your ear. These symptoms may represent a serious problem that is an emergency. Do not wait to see if the symptoms will go away. Get medical help right away. Call your local emergency services (911 in the [place] Do not drive yourself to the hospital. Summary Otitis media with effusion (OME) is inflammation and fluid (effusion) in the middle ear without having an ear infection. A swollen eustachian tube can become blocked and cause fluid to collect in the middle ear. Treatment for OME depends on the cause of the condition and the severity of symptoms. Many times, treatment is not needed because the fluid drains on its own in a few weeks. Sometimes OME can lead to hearing problems and recurrent acute ear infections (acute otitis media), which may require treatment. This information is not intended to replace advice given to you by your health care provider. Make sure you discuss any questions you have with your health care provider. Document Revised: 02/03/2021 Document  Reviewed: 02/03/2021 Elsevier Patient Education  2024 [employer].

## 2024-01-24 DIAGNOSIS — E039 Hypothyroidism, unspecified: Secondary | ICD-10-CM | POA: Diagnosis not present

## 2024-01-24 DIAGNOSIS — I10 Essential (primary) hypertension: Secondary | ICD-10-CM | POA: Diagnosis not present

## 2024-01-24 DIAGNOSIS — G4733 Obstructive sleep apnea (adult) (pediatric): Secondary | ICD-10-CM | POA: Diagnosis not present

## 2024-01-24 DIAGNOSIS — H25812 Combined forms of age-related cataract, left eye: Secondary | ICD-10-CM | POA: Diagnosis not present

## 2024-01-29 ENCOUNTER — Ambulatory Visit: Admitting: Family Medicine

## 2024-02-10 ENCOUNTER — Other Ambulatory Visit: Payer: Self-pay | Admitting: Family Medicine

## 2024-02-11 ENCOUNTER — Ambulatory Visit: Admitting: Family Medicine

## 2024-02-11 ENCOUNTER — Other Ambulatory Visit: Payer: Self-pay | Admitting: Family Medicine

## 2024-02-11 ENCOUNTER — Encounter: Payer: Self-pay | Admitting: Family Medicine

## 2024-02-11 VITALS — BP 110/64 | HR 68 | Temp 98.8°F | Ht 63.0 in | Wt 204.5 lb

## 2024-02-11 DIAGNOSIS — M26629 Arthralgia of temporomandibular joint, unspecified side: Secondary | ICD-10-CM

## 2024-02-11 DIAGNOSIS — H6991 Unspecified Eustachian tube disorder, right ear: Secondary | ICD-10-CM | POA: Diagnosis not present

## 2024-02-11 DIAGNOSIS — J309 Allergic rhinitis, unspecified: Secondary | ICD-10-CM | POA: Diagnosis not present

## 2024-02-11 DIAGNOSIS — R5383 Other fatigue: Secondary | ICD-10-CM | POA: Diagnosis not present

## 2024-02-11 DIAGNOSIS — M791 Myalgia, unspecified site: Secondary | ICD-10-CM

## 2024-02-11 NOTE — Patient Instructions (Signed)
 Follow up as needed or as scheduled We'll notify you of your lab results and make any changes if needed CONTINUE the daily allergy medication and nasal spray Use Tylenol  or ibuprofen as needed for jaw pain RESTART your CPAP nightly Try and get back into regular physical activity Call with any questions or concerns Hang in there!

## 2024-02-11 NOTE — Progress Notes (Signed)
   Subjective:    Patient ID: Kristine Olson, female    DOB: 01-16-1946, 78 y.o.   MRN: [id number]  HPI Fatigue- continues to have muscle aches in arms.  It is better than it was but not gone.  Will rarely occur in legs.  Energy level is better but has not returned to baseline.  Has not been using CPAP since her sinus infxn last month.  Pt admits to poor water  intake.  Has not been exercising since the holidays.  'ears are definitely better'- will continue to pop on R side.  Just started using nasal steroid in the last week.  Sinus HA has resolved.    TMJ- continues to have jaw pain w/ chewing.  Will pop.  Sxs are better than they were   Review of Systems For ROS see HPI     Objective:   Physical Exam Vitals reviewed.  Constitutional:      General: She is not in acute distress.    Appearance: Normal appearance. She is well-developed. She is obese.  HENT:     Head: Normocephalic and atraumatic.     Right Ear: Tympanic membrane and ear canal normal.     Left Ear: Tympanic membrane and ear canal normal.  Eyes:     Conjunctiva/sclera: Conjunctivae normal.     Pupils: Pupils are equal, round, and reactive to light.  Neck:     Thyroid : No thyromegaly.  Cardiovascular:     Rate and Rhythm: Normal rate and regular rhythm.     Heart sounds: Normal heart sounds. No murmur heard. Pulmonary:     Effort: Pulmonary effort is normal. No respiratory distress.     Breath sounds: Normal breath sounds.  Abdominal:     General: There is no distension.     Palpations: Abdomen is soft.     Tenderness: There is no abdominal tenderness.  Musculoskeletal:     Cervical back: Normal range of motion and neck supple.  Lymphadenopathy:     Cervical: No cervical adenopathy.  Skin:    General: Skin is warm and dry.  Neurological:     General: No focal deficit present.     Mental Status: She is alert and oriented to person, place, and time.  Psychiatric:        Mood and Affect: Mood normal.         Behavior: Behavior normal.           Assessment & Plan:  R eustachian tube dysfxn- ongoing issue.  Improved.  Continue daily antihistamine and nasal spray.  TMJ- continue tylenol  or ibuprofen prn.  Discussed adjusting diet to limit amount of chewing.

## 2024-02-12 ENCOUNTER — Encounter: Payer: Self-pay | Admitting: Family Medicine

## 2024-02-12 ENCOUNTER — Other Ambulatory Visit: Payer: Self-pay | Admitting: Family Medicine

## 2024-02-12 ENCOUNTER — Ambulatory Visit: Admitting: Family Medicine

## 2024-02-12 VITALS — BP 122/76 | HR 81 | Temp 98.1°F | Ht 63.0 in | Wt 201.0 lb

## 2024-02-12 DIAGNOSIS — E66812 Obesity, class 2: Secondary | ICD-10-CM | POA: Diagnosis not present

## 2024-02-12 DIAGNOSIS — Z6835 Body mass index (BMI) 35.0-35.9, adult: Secondary | ICD-10-CM

## 2024-02-12 DIAGNOSIS — M791 Myalgia, unspecified site: Secondary | ICD-10-CM | POA: Diagnosis not present

## 2024-02-12 DIAGNOSIS — R7303 Prediabetes: Secondary | ICD-10-CM | POA: Diagnosis not present

## 2024-02-12 DIAGNOSIS — E559 Vitamin D deficiency, unspecified: Secondary | ICD-10-CM | POA: Diagnosis not present

## 2024-02-12 LAB — BASIC METABOLIC PANEL WITH GFR
BUN: 15 mg/dL (ref 6–23)
CO2: 29 meq/L (ref 19–32)
Calcium: 9.2 mg/dL (ref 8.4–10.5)
Chloride: 100 meq/L (ref 96–112)
Creatinine, Ser: 1.1 mg/dL (ref 0.40–1.20)
GFR: 49.1 mL/min — ABNORMAL LOW (ref >60.00)
Glucose, Bld: 78 mg/dL (ref 70–99)
Potassium: 4.7 meq/L (ref 3.5–5.1)
Sodium: 138 meq/L (ref 135–145)

## 2024-02-12 LAB — CBC WITH DIFFERENTIAL/PLATELET
Basophils Absolute: 0.1 10*3/uL (ref 0.0–0.1)
Basophils Relative: 0.8 % (ref 0.0–3.0)
Eosinophils Absolute: 0.5 10*3/uL (ref 0.0–0.7)
Eosinophils Relative: 4.5 % (ref 0.0–5.0)
HCT: 35.8 % — ABNORMAL LOW (ref 36.0–46.0)
Hemoglobin: 11.6 g/dL — ABNORMAL LOW (ref 12.0–15.0)
Lymphocytes Relative: 13 % (ref 12.0–46.0)
Lymphs Abs: 1.3 10*3/uL (ref 0.7–4.0)
MCHC: 32.4 g/dL (ref 30.0–36.0)
MCV: 84 fl (ref 78.0–100.0)
Monocytes Absolute: 0.6 10*3/uL (ref 0.1–1.0)
Monocytes Relative: 5.4 % (ref 3.0–12.0)
Neutro Abs: 7.8 10*3/uL — ABNORMAL HIGH (ref 1.4–7.7)
Neutrophils Relative %: 76.3 % (ref 43.0–77.0)
Platelets: 346 10*3/uL (ref 150.0–400.0)
RBC: 4.27 Mil/uL (ref 3.87–5.11)
RDW: 15 % (ref 11.5–15.5)
WBC: 10.3 10*3/uL (ref 4.0–10.5)

## 2024-02-12 LAB — TSH: TSH: 1.89 u[IU]/mL (ref 0.35–5.50)

## 2024-02-12 LAB — HEPATIC FUNCTION PANEL
ALT: 10 U/L (ref 0–35)
AST: 17 U/L (ref 0–37)
Albumin: 3.7 g/dL (ref 3.5–5.2)
Alkaline Phosphatase: 62 U/L (ref 39–117)
Bilirubin, Direct: 0.1 mg/dL (ref 0.0–0.3)
Total Bilirubin: 0.5 mg/dL (ref 0.2–1.2)
Total Protein: 7.2 g/dL (ref 6.0–8.3)

## 2024-02-12 LAB — HEMOGLOBIN A1C: Hgb A1c MFr Bld: 6.2 % (ref 4.6–6.5)

## 2024-02-12 LAB — B12 AND FOLATE PANEL
Folate: 7.1 ng/mL (ref >5.9)
Vitamin B-12: 594 pg/mL (ref 211–911)

## 2024-02-12 LAB — VITAMIN D 25 HYDROXY (VIT D DEFICIENCY, FRACTURES): VITD: 23.64 ng/mL — ABNORMAL LOW (ref 30.00–100.00)

## 2024-02-12 NOTE — Patient Instructions (Addendum)
 Kodiak Cake Waffles + peanut butter and sliced berries Premier protein cereal + [employer] milk  Reduce the sugar and snacks

## 2024-02-12 NOTE — Progress Notes (Signed)
 Office: [phone]  /  Fax: [phone]  WEIGHT SUMMARY AND BIOMETRICS  Starting Date: 10/12/22  Starting Weight: 222lb   Weight Lost Since Last Visit: 0   Vitals Temp: 98.1 F (36.7 C) BP: 122/76 Pulse Rate: 81 SpO2: 98 %    HPI  Chief Complaint: OBESITY  Kristine Olson is here to discuss her progress with her obesity treatment plan. She is on the the Category 1 Plan and states she is following her eating plan approximately 25 % of the time. She states she is exercising very little 0 minutes 0 times per week.  Interval History:  Since last office visit she is up 2 lb (last seen 3 mos ago) This gives her a net weight loss of 21 pounds in the past 1.5 years of medically supervised weight management She has had cataract surgery since last visit She has been very tired lately undergoing a workup by her PCP for diffuse myalgias She doesn't eat much fruit and dislikes greek yogurt She has been off of a low calorie healthy meal plan She denies excessive appetite or cravings She complains of an increase of fatigue with less regular exercise  Pharmacotherapy: None  PHYSICAL EXAM:  Blood pressure 122/76, pulse 81, temperature 98.1 F (36.7 C), height 5\' 3"  (1.6 m), weight 201 lb (91.2 kg), SpO2 98%. Body mass index is 35.61 kg/m.  General: She is overweight, cooperative, alert, well developed, and in no acute distress. PSYCH: Has normal mood, affect and thought process.   Lungs: Normal breathing effort, no conversational dyspnea.   ASSESSMENT AND PLAN  TREATMENT PLAN FOR OBESITY:  Recommended Dietary Goals  Kristine Olson is currently in the action stage of change. As such, her goal is to continue weight management plan. She has agreed to practicing portion control and making smarter food choices, such as increasing vegetables and decreasing simple carbohydrates. Reviewed dietary change goal to get around after visit summary Plan to update a fasting IC in 2  months  Behavioral Intervention  We discussed the following Behavioral Modification Strategies today: increasing lean protein intake to established goals, increasing fiber rich foods, avoiding skipping meals, increasing water  intake , work on meal planning and preparation, keeping healthy foods at home, practice mindfulness eating and understand the difference between hunger signals and cravings, work on managing stress, creating time for self-care and relaxation, avoiding temptations and identifying enticing environmental cues, and continue to work on maintaining a reduced calorie state, getting the recommended amount of protein, incorporating whole foods, making healthy choices, staying well hydrated and practicing mindfulness when eating..  Additional resources provided today: NA  Recommended Physical Activity Goals  Kristine Olson has been advised to work up to 150 minutes of moderate intensity aerobic activity a week and strengthening exercises 2-3 times per week for cardiovascular health, weight loss maintenance and preservation of muscle mass.   She has agreed to Think about enjoyable ways to increase daily physical activity and overcoming barriers to exercise and Increase physical activity in their day and reduce sedentary time (increase NEAT). Begin gentle chair exercises or supine exercises at home  Pharmacotherapy changes for the treatment of obesity: None  ASSOCIATED CONDITIONS ADDRESSED TODAY  Pre-diabetes Lab Results  Component Value Date   HGBA1C 6.2 02/11/2024  A1c repeated by PCP yesterday.  She remains in the prediabetic range with a slight increase in A1c.  She is currently not on any medication for prediabetes.  She has been on her low calorie/low sugar diet, consuming more starches and sugar and  has done less physical activity over the past few months for not feeling well.  Begin working on reducing added sugar and refined carbohydrates focusing on lean protein and fiber with  meals.  Begin gentle exercises at home.  Consider referral to the diabetes prevention program  Class 2 severe obesity due to excess calories with serious comorbidity and body mass index (BMI) of 35.0 to 35.9 in adult [facility])  Myalgia An ANA was drawn by her PCP yesterday, awaiting results.  A CK was drawn and reviewed from 01/07/2024 at 5 1.  She does complain of diffuse bilateral upper extremity myalgias without joint pain.  This did start after a viral infection.  She has associated fatigue.  Her fatigue and muscle aches have limited her ability to exercise over the past several months.  This has affected her weight slightly.  Keep upcoming visit with PCP following lab results.  Begin gentle slow exercise.  Focus on good nutrition and adequate sleep at night.  Vitamin D  deficiency Last vitamin D  Lab Results  Component Value Date   VD25OH 23.64 (L) 02/11/2024  Reviewed labs obtained by PCP yesterday.  Her vitamin D  is low at 23.64.  She has been taking Caltrate with D twice daily.  She is off of a separate vitamin D  supplement.  She does complain of increased fatigue.  She has myalgias.  Follow-up with PCP.  She will likely need additional vitamin D .      She was informed of the importance of frequent follow up visits to maximize her success with intensive lifestyle modifications for her multiple health conditions.   ATTESTASTION STATEMENTS:  Reviewed by clinician on day of visit: allergies, medications, problem list, medical history, surgical history, family history, social history, and previous encounter notes pertinent to obesity diagnosis.   I have personally spent 30 minutes total time today in preparation, patient care, nutritional counseling and education,  and documentation for this visit, including the following: review of most recent clinical lab tests, prescribing medications/ refilling medications, reviewing medical assistant documentation, review and interpretation of  bioimpedence results.     Kristine Olson, D.O. DABFM, DABOM [facility] [address] [place], [place] [address] [phone]

## 2024-02-13 ENCOUNTER — Other Ambulatory Visit: Payer: Self-pay

## 2024-02-13 ENCOUNTER — Telehealth: Payer: Self-pay

## 2024-02-13 LAB — ANTI-NUCLEAR AB-TITER (ANA TITER): ANA Titer 1: 1:80 {titer} — ABNORMAL HIGH

## 2024-02-13 LAB — ANA: Anti Nuclear Antibody (ANA): POSITIVE — AB

## 2024-02-13 MED ORDER — VITAMIN D (ERGOCALCIFEROL) 1.25 MG (50000 UNIT) PO CAPS: 50000.0000 [IU] | ORAL_CAPSULE | ORAL | 0 refills | Status: DC

## 2024-02-13 NOTE — Telephone Encounter (Signed)
 Pt has reviewed labs via MyChart Vitamin D  has been sent in to pharmacy on file

## 2024-02-13 NOTE — Telephone Encounter (Signed)
-----   Message from Laymon Priest sent at 02/12/2024  5:13 PM EDT ----- Labs are stable and look good w/ exception of low Vit D.  Based on this, we need to start 50,000 units weekly x12 weeks in addition to daily OTC supplement of at least 2000 units. This can definitely contribute to fatigue.  Your A1C is in the pre-diabetes range.  Please be mindful of your carb and sugar intake and try and get regular physical activity.  Still waiting on the autoimmune lab but remainder of labs look good

## 2024-02-14 ENCOUNTER — Other Ambulatory Visit: Payer: Self-pay

## 2024-02-14 ENCOUNTER — Telehealth: Payer: Self-pay

## 2024-02-14 DIAGNOSIS — R768 Other specified abnormal immunological findings in serum: Secondary | ICD-10-CM

## 2024-02-14 NOTE — Telephone Encounter (Signed)
-----   Message from Laymon Priest sent at 02/14/2024  7:32 AM EDT ----- Your ANA is slightly positive.  This does not mean you have an autoimmune condition but it means it is worth a full investigation by rheumatology.  We will place the referral and someone will call you regarding scheduling.  (Refer rheum- dx + ANA)

## 2024-02-14 NOTE — Telephone Encounter (Signed)
 Pt has reviewed via MyChart Referral has been placed

## 2024-02-18 ENCOUNTER — Encounter: Payer: Self-pay | Admitting: Family Medicine

## 2024-02-18 DIAGNOSIS — M791 Myalgia, unspecified site: Secondary | ICD-10-CM

## 2024-02-18 DIAGNOSIS — R768 Other specified abnormal immunological findings in serum: Secondary | ICD-10-CM

## 2024-02-18 DIAGNOSIS — R5383 Other fatigue: Secondary | ICD-10-CM

## 2024-02-18 NOTE — Telephone Encounter (Signed)
 Patient concerned waiting 5 more months to see someone for rheumatology due to pain daily. Patient is questioning if you think waiting is fine or if she should see someone sooner?   Patient is okay with going to [place] if needed for rheumatology.

## 2024-02-19 NOTE — Assessment & Plan Note (Signed)
 Ongoing issue for pt.  Accompanied by myalgias.  Stressed need to restart CPAP to improve sleep/energy.  Encouraged her to increase water  and restart exercise.  Discussed that energy is a pay it forward kind of thing- you need to put effort in to have more energy.  Will check labs to r/o possible metabolic causes of fatigue or rheumatologic causes (which is pt's main concern).  Will determine next steps based on lab results.

## 2024-02-26 ENCOUNTER — Telehealth: Payer: Self-pay

## 2024-02-26 NOTE — Telephone Encounter (Signed)
 Copied from CRM [id number]. Topic: Referral - Status >> Feb 26, 2024  2:20 PM Orien Bird wrote: Reason for CRM: Patient called in regards to her referral she stated she called the office and they stated they have not received anything, please assist.

## 2024-02-27 ENCOUNTER — Other Ambulatory Visit: Payer: Self-pay

## 2024-02-27 DIAGNOSIS — R768 Other specified abnormal immunological findings in serum: Secondary | ICD-10-CM

## 2024-02-27 NOTE — Telephone Encounter (Signed)
 Referrals have been placed to both [place] and [facility].  These offices are able to request records if they are not able to see the information in Epic.  No one has reached out indicating that more information is needed.  If they are telling her more info is needed, she may need to sign a release of records at their office to allow them to request it

## 2024-02-27 NOTE — Telephone Encounter (Signed)
 Spoke with pt and she states referral was placed and Rheumatology states they need more information from chart. Please advise

## 2024-02-28 NOTE — Telephone Encounter (Signed)
 Spoke with pt and gave pt our office fax number to have the other office send information to us .

## 2024-02-28 NOTE — Telephone Encounter (Signed)
Called pt and unable to leave vm

## 2024-03-05 ENCOUNTER — Telehealth: Payer: Self-pay

## 2024-03-05 NOTE — Telephone Encounter (Signed)
 Spoke with [employer] request labs [employer] notes from last visit. Called pt to make her aware we are waiting on 1 last form

## 2024-03-05 NOTE — Telephone Encounter (Signed)
 Copied from CRM [id number]. Topic: Referral - Question >> Mar 05, 2024  3:24 PM Abigail D wrote: Reason for CRM: Patient calling to notify that the rheumatology clinic has still not recieved the requested chart information from Dr. Darilyn Edin office, patient is frustrated as it has been 2 weeks and she hasn't been able to be seen yet.

## 2024-03-05 NOTE — Telephone Encounter (Signed)
 We have not received any information from [facility] nor have any messages where they are asking for information.  Please call [phone] and see what their office needs

## 2024-03-06 NOTE — Telephone Encounter (Signed)
 Notified pt we have not received the last form. Advised pt to call this office and ask if everything they requested and faxed was enough to make her appt.

## 2024-03-12 ENCOUNTER — Ambulatory Visit: Admitting: Family Medicine

## 2024-03-24 DIAGNOSIS — M7732 Calcaneal spur, left foot: Secondary | ICD-10-CM | POA: Diagnosis not present

## 2024-03-24 DIAGNOSIS — M19072 Primary osteoarthritis, left ankle and foot: Secondary | ICD-10-CM | POA: Diagnosis not present

## 2024-03-24 DIAGNOSIS — M7731 Calcaneal spur, right foot: Secondary | ICD-10-CM | POA: Diagnosis not present

## 2024-03-24 DIAGNOSIS — M19071 Primary osteoarthritis, right ankle and foot: Secondary | ICD-10-CM | POA: Diagnosis not present

## 2024-03-24 DIAGNOSIS — M19041 Primary osteoarthritis, right hand: Secondary | ICD-10-CM | POA: Diagnosis not present

## 2024-03-24 DIAGNOSIS — Z981 Arthrodesis status: Secondary | ICD-10-CM | POA: Diagnosis not present

## 2024-03-24 DIAGNOSIS — M180 Bilateral primary osteoarthritis of first carpometacarpal joints: Secondary | ICD-10-CM | POA: Diagnosis not present

## 2024-03-24 DIAGNOSIS — R768 Other specified abnormal immunological findings in serum: Secondary | ICD-10-CM | POA: Diagnosis not present

## 2024-03-24 DIAGNOSIS — M2550 Pain in unspecified joint: Secondary | ICD-10-CM | POA: Diagnosis not present

## 2024-03-26 DIAGNOSIS — M79641 Pain in right hand: Secondary | ICD-10-CM | POA: Insufficient documentation

## 2024-03-26 DIAGNOSIS — F411 Generalized anxiety disorder: Secondary | ICD-10-CM | POA: Diagnosis not present

## 2024-03-26 DIAGNOSIS — M79642 Pain in left hand: Secondary | ICD-10-CM | POA: Diagnosis not present

## 2024-03-27 ENCOUNTER — Encounter: Payer: Self-pay | Admitting: Family Medicine

## 2024-03-27 ENCOUNTER — Ambulatory Visit: Admitting: Family Medicine

## 2024-03-27 VITALS — BP 126/80 | HR 82 | Temp 97.9°F | Ht 63.0 in | Wt 203.0 lb

## 2024-03-27 DIAGNOSIS — R7303 Prediabetes: Secondary | ICD-10-CM | POA: Diagnosis not present

## 2024-03-27 DIAGNOSIS — M79661 Pain in right lower leg: Secondary | ICD-10-CM | POA: Diagnosis not present

## 2024-03-27 DIAGNOSIS — E559 Vitamin D deficiency, unspecified: Secondary | ICD-10-CM | POA: Diagnosis not present

## 2024-03-27 DIAGNOSIS — M79662 Pain in left lower leg: Secondary | ICD-10-CM

## 2024-03-27 DIAGNOSIS — I10 Essential (primary) hypertension: Secondary | ICD-10-CM

## 2024-03-27 DIAGNOSIS — E66812 Obesity, class 2: Secondary | ICD-10-CM | POA: Diagnosis not present

## 2024-03-27 DIAGNOSIS — Z6835 Body mass index (BMI) 35.0-35.9, adult: Secondary | ICD-10-CM

## 2024-03-27 NOTE — Progress Notes (Unsigned)
 Office: [phone]  /  Fax: [phone]  WEIGHT SUMMARY AND BIOMETRICS  Starting Date: 10/12/22  Starting Weight: 222lb   Weight Lost Since Last Visit: 0lb   Vitals Temp: 97.9 F (36.6 C) BP: 126/80 Pulse Rate: 82 SpO2: 98 %   Body Composition  Body Fat %: 48.1 % Fat Mass (lbs): 97.6 lbs Muscle Mass (lbs): 100 lbs Total Body Water  (lbs): 76.6 lbs Visceral Fat Rating : 16     HPI  Chief Complaint: OBESITY  Kristine Olson is here to discuss her progress with her obesity treatment plan. She is on the the Category 1 Plan and states she is following her eating plan approximately 10 % of the time. She states she is exercising 0 minutes 0 times per week.  Interval History:  Since last office visit she is up 2 lb She continue to have fatigue and achieness in her legs that has limited her walking time She did see rheumatology, had labs done This gives her a net weight loss of 19 lb in 1.5 years of medically supervised weight management She admits to not being mentally ready to commit to healthy eating or exercise due to not feeling great She has been doing more emotional eating  She was previously going to [facility]  Pharmacotherapy: none  PHYSICAL EXAM:  Blood pressure 126/80, pulse 82, temperature 97.9 F (36.6 C), height 5\' 3"  (1.6 m), weight 203 lb (92.1 kg), SpO2 98%. Body mass index is 35.96 kg/m.  General: She is overweight, cooperative, alert, well developed, and in no acute distress. PSYCH: Has normal mood, affect and thought process.   Lungs: Normal breathing effort, no conversational dyspnea. Able to walk independently  ASSESSMENT AND PLAN  TREATMENT PLAN FOR OBESITY:  Recommended Dietary Goals  Loriel is currently in the action stage of change. As such, her goal is to continue weight management plan. She has agreed to practicing portion control and making smarter food choices, such as increasing vegetables and decreasing simple  carbohydrates.  Behavioral Intervention  We discussed the following Behavioral Modification Strategies today: increasing lean protein intake to established goals, increasing fiber rich foods, increasing water  intake , keeping healthy foods at home, work on managing stress, creating time for self-care and relaxation, avoiding temptations and identifying enticing environmental cues, and continue to work on maintaining a reduced calorie state, getting the recommended amount of protein, incorporating whole foods, making healthy choices, staying well hydrated and practicing mindfulness when eating..  Additional resources provided today: NA  Recommended Physical Activity Goals  Satina has been advised to work up to 150 minutes of moderate intensity aerobic activity a week and strengthening exercises 2-3 times per week for cardiovascular health, weight loss maintenance and preservation of muscle mass.   She has agreed to Think about enjoyable ways to increase daily physical activity and overcoming barriers to exercise and Increase physical activity in their day and reduce sedentary time (increase NEAT).  Pharmacotherapy changes for the treatment of obesity: none  ASSOCIATED CONDITIONS ADDRESSED TODAY  Bilateral calf pain New problem C/o bilateral calf pain with walking that has limited her ability to exercise for weight reduction with associated 'heaviness' feeling in her legs and fatigue.  Denies leg edema or hx of varicose veins or lower back pain.  She has not had a vascular workup and this has a high likelihood of PAD with claudication symptoms.   She has been seen by Dr Lavonne Prairie in the past, referral placed. -     Ambulatory  referral to Cardiology  Essential hypertension BP is well controlled on metoprolol  tartrate 12.5 mg daily, spironolactone  25 mg daily by PCP.  Continue current medications and work on a mediterranean diet -     Ambulatory referral to Cardiology  Class 2 obesity due  to excess calories with body mass index (BMI) of 35.0 to 35.9 in adult, unspecified whether serious comorbidity present  Pre-diabetes Lab Results  Component Value Date   HGBA1C 6.2 02/11/2024  Her last A1c did rise with failure to comply with a lower sugar diet and more inactivity.  She is not on any medication for prediabetes Encouraged light exercise/ short walks and limiting access to sugar at home  Vitamin D  deficiency Last vitamin D  Lab Results  Component Value Date   VD25OH 23.64 (L) 02/11/2024   Vitamin D  was low on last check in April and she remains on RX vitamin D  50,000 international units  weekly.  Energy level remains poor     She was informed of the importance of frequent follow up visits to maximize her success with intensive lifestyle modifications for her multiple health conditions.   ATTESTASTION STATEMENTS:  Reviewed by clinician on day of visit: allergies, medications, problem list, medical history, surgical history, family history, social history, and previous encounter notes pertinent to obesity diagnosis.   I have personally spent 30 minutes total time today in preparation, patient care, nutritional counseling and education,  and documentation for this visit, including the following: review of most recent clinical lab tests, prescribing medications/ refilling medications, reviewing medical assistant documentation, review and interpretation of bioimpedence results.     Micky Albee, D.O. DABFM, DABOM [facility] [address] [place], [place] [address] [phone]

## 2024-04-02 DIAGNOSIS — M79641 Pain in right hand: Secondary | ICD-10-CM | POA: Diagnosis not present

## 2024-04-02 DIAGNOSIS — M79642 Pain in left hand: Secondary | ICD-10-CM | POA: Diagnosis not present

## 2024-04-11 ENCOUNTER — Other Ambulatory Visit: Payer: Self-pay | Admitting: Family Medicine

## 2024-04-11 DIAGNOSIS — J309 Allergic rhinitis, unspecified: Secondary | ICD-10-CM

## 2024-04-11 DIAGNOSIS — H6593 Unspecified nonsuppurative otitis media, bilateral: Secondary | ICD-10-CM

## 2024-04-30 ENCOUNTER — Encounter: Payer: Self-pay | Admitting: Family Medicine

## 2024-04-30 ENCOUNTER — Ambulatory Visit: Admitting: Family Medicine

## 2024-04-30 VITALS — BP 112/62 | HR 80 | Temp 98.3°F | Ht 63.0 in | Wt 204.5 lb

## 2024-04-30 DIAGNOSIS — M79662 Pain in left lower leg: Secondary | ICD-10-CM

## 2024-04-30 DIAGNOSIS — M545 Low back pain, unspecified: Secondary | ICD-10-CM

## 2024-04-30 DIAGNOSIS — M79661 Pain in right lower leg: Secondary | ICD-10-CM

## 2024-04-30 MED ORDER — MELOXICAM 7.5 MG PO TABS: 7.5000 mg | ORAL_TABLET | Freq: Every day | ORAL | 0 refills | Status: DC

## 2024-04-30 NOTE — Patient Instructions (Signed)
 Follow up as needed or as scheduled We'll call you to schedule your vascular appt to check your legs START the Meloxicam  once daily- take w/ food- for 7 days and then as needed for back pain If no improvement in pain, please let me know so we can refer to Physical Therapy Call with any questions or concerns Hang in there!

## 2024-04-30 NOTE — Progress Notes (Signed)
   Subjective:    Patient ID: Kristine Olson, female    DOB: [birth year], 78 y.o.   MRN: [id number]  HPI Leg pain- bilateral.  Pt is having a hard time walking around the house or the grocery store.  Sxs started 6 weeks ago.  Bariatric doctor thought the pain was related to varicose veins.  Pain is described as an aching pain.  In November she was able to walk around [place] and 'keep up just fine'.    Back pain- sxs started 2 weeks ago.  L sided, low back.  Triggered by bending forward or getting up off the toilet seat.  No radiation of pain.  Pain eases w/ direct pressure over SI joint or lying flat.  Pt has hx of disc herniation.  Denies muscle spasm.  Pain is not constant.   Review of Systems For ROS see HPI     Objective:   Physical Exam Vitals reviewed.  Constitutional:      General: She is not in acute distress.    Appearance: Normal appearance. She is obese. She is not ill-appearing.  Cardiovascular:     Pulses: Normal pulses.  Pulmonary:     Effort: No respiratory distress.  Musculoskeletal:        General: Tenderness (TTP over L PSIS) present. No swelling or deformity.  Skin:    General: Skin is warm and dry.     Findings: No bruising or erythema.  Neurological:     General: No focal deficit present.     Mental Status: She is alert and oriented to person, place, and time.     Comments: (-) SLR bilaterally  Psychiatric:        Mood and Affect: Mood normal.        Behavior: Behavior normal.        Thought Content: Thought content normal.           Assessment & Plan:  Bilateral calf pain- new.  Concern is for claudication.  Sxs started ~6 weeks ago.  Last year pt was able to walk around [place] and keep up w/ the tour group.  Now, she has a hard time walking around the grocery store or her home.  Pain improves w/ resting.  Will refer to Vascular for complete evaluation.  Pt expressed understanding and is in agreement w/ plan.   L low back pain- new.  Sxs started  ~2 weeks ago.  Triggered by bending or trying to rise from a seated position.  Pain is not constant, does not radiate.  (-) SLR bilaterally.  Seems to be a muscle strain.  Improves w/ ibuprofen.  Will start low dose Meloxicam  and monitor for improvement.  Pt expressed understanding and is in agreement w/ plan.

## 2024-05-04 ENCOUNTER — Other Ambulatory Visit: Payer: Self-pay | Admitting: Family Medicine

## 2024-05-07 DIAGNOSIS — R92323 Mammographic fibroglandular density, bilateral breasts: Secondary | ICD-10-CM | POA: Diagnosis not present

## 2024-05-07 DIAGNOSIS — Z1231 Encounter for screening mammogram for malignant neoplasm of breast: Secondary | ICD-10-CM | POA: Diagnosis not present

## 2024-05-07 LAB — HM MAMMOGRAPHY

## 2024-05-08 ENCOUNTER — Encounter: Payer: Self-pay | Admitting: Family Medicine

## 2024-05-22 ENCOUNTER — Other Ambulatory Visit: Payer: Self-pay | Admitting: Vascular Surgery

## 2024-05-22 DIAGNOSIS — M79606 Pain in leg, unspecified: Secondary | ICD-10-CM

## 2024-05-30 ENCOUNTER — Ambulatory Visit: Admitting: Family Medicine

## 2024-06-02 DIAGNOSIS — F411 Generalized anxiety disorder: Secondary | ICD-10-CM | POA: Diagnosis not present

## 2024-06-03 ENCOUNTER — Ambulatory Visit: Admitting: Family Medicine

## 2024-06-17 ENCOUNTER — Telehealth: Payer: Self-pay

## 2024-06-17 NOTE — Telephone Encounter (Signed)
 The patient has an appointment 06/18/24 and she called to report that she tested positive for Covid. She states she does not have a fever nor has she taken any fever reducing medication since 06/16/24. The patient was instructed to take her temp before coming to her appointment and if she has a fever she will need to reschedule. Also she was instructed to wear a well fitting mask for the entirety of her visit including in the office lobby areas.   The patient verbalized understanding of these directions.

## 2024-06-18 ENCOUNTER — Ambulatory Visit: Admitting: Vascular Surgery

## 2024-06-18 ENCOUNTER — Ambulatory Visit
Admission: RE | Admit: 2024-06-18 | Discharge: 2024-06-18 | Disposition: A | Discharge status: Home / Self Care | Source: Ambulatory Visit | Attending: Vascular Surgery | Admitting: Vascular Surgery

## 2024-06-18 ENCOUNTER — Encounter: Payer: Self-pay | Admitting: Vascular Surgery

## 2024-06-18 VITALS — BP 114/80 | HR 83 | Temp 97.9°F | Ht 63.0 in | Wt 205.0 lb

## 2024-06-18 DIAGNOSIS — M25562 Pain in left knee: Secondary | ICD-10-CM | POA: Diagnosis not present

## 2024-06-18 DIAGNOSIS — M25561 Pain in right knee: Secondary | ICD-10-CM

## 2024-06-18 DIAGNOSIS — M79606 Pain in leg, unspecified: Secondary | ICD-10-CM | POA: Diagnosis not present

## 2024-06-18 LAB — VAS US ABI WITH/WO TBI
Left ABI: 0.92
Right ABI: 1.01

## 2024-06-18 NOTE — Progress Notes (Signed)
 Patient ID: Kristine Olson, female   DOB: [birth year], 78 y.o.   MRN: [id number]  Reason for Consult: New Patient (Initial Visit)   Referred by Mahlon Comer BRAVO, MD  Subjective:     HPI:  Kristine Olson is a 78 y.o. female currently with bilateral lower extremity pain below the knees particularly with walking.  She also has generalized fatigue and knee pain with history of total knee replacement on the left and partial on the right.  She was having pain in her forearms but this has resolved.  She states that the pain is crampy and occasionally severe in nature.  It does not occur at rest.  She has no history of vascular invention.  She does not have any tissue loss or ulceration.  She states that occasionally she is completely without pain but sometimes she is debilitated with very short distance walking.  It has been improving of late.  Past Medical History:  Diagnosis Date   Acute pyloric channel ulcer with hemorrhage on Xarelto     Anemia    history of anemia after bleeding ulcer   Arthritis    ostearthritis - right  medial knee   Back pain    Cataract    beginning cataracts   Complication of anesthesia    was given a medication while waiting to go to surgery that caused burning sensation on scalp was given another medication and the burning went away   COVID-19 2022   Depression    Edema    GERD (gastroesophageal reflux disease)    heartburn daily-TUMS helpful   Hemorrhoid    History of blood transfusion    2 units PRBC   History of stomach ulcers    Hx of sinus tachycardia    rarely any problems since metoprolol  use   Hyperlipidemia    patient unaware, has never taking medication   Hypertension    Hypothyroidism    Joint pain    Osteoporosis    osteopenia   Sleep apnea    uses c pap   SOB (shortness of breath)    Varicose vein of leg    Vitamin D  deficiency    Family History  Problem Relation Age of Onset   Hypertension Mother    Osteoporosis  Mother    Thyroid  disease Mother    Rheum arthritis Mother    Hypertension Father    Alcohol abuse Father    Liver disease Father    Obesity Father    Alcoholism Father    Depression Father    Melanoma Brother    Cancer Maternal Grandmother    Heart attack Maternal Grandfather        > 75   Multiple sclerosis Daughter 48   Depression Daughter    Bipolar disorder Daughter    Stroke Maternal Aunt         X 3    Diabetes Maternal Aunt    Deep vein thrombosis Paternal Aunt    Other Paternal Aunt        Phlebitis without deep venous thrombosis   Colon cancer Neg Hx    Colon polyps Neg Hx    Esophageal cancer Neg Hx    Rectal cancer Neg Hx    Stomach cancer Neg Hx    Past Surgical History:  Procedure Laterality Date   BIOPSY BREAST     2 biopsies & 2 aspirations   CESAREAN SECTION     CLOSED REDUCTION TOE FRACTURE  COLONOSCOPY  2015   COLONOSCOPY W/ POLYPECTOMY  multiple since 2007   adenomas, diverticulosis;last 2015   ESOPHAGOGASTRODUODENOSCOPY (EGD) WITH PROPOFOL  N/A 05/29/2016   Procedure: ESOPHAGOGASTRODUODENOSCOPY (EGD) WITH PROPOFOL ;  Surgeon: Lupita FORBES Commander, MD;  Location: [facility];  Service: Endoscopy;  Laterality: N/A;   HAMMER TOE SURGERY     PARTIAL KNEE ARTHROPLASTY Right 05/22/2016   Procedure: RIGHT KNEE MEDIAL UNICOMPARTMENTAL ARTHROPLASTY;  Surgeon: Dempsey Moan, MD;  Location: [facility];  Service: Orthopedics;  Laterality: Right;   TONSILLECTOMY AND ADENOIDECTOMY     TOTAL KNEE ARTHROPLASTY Left 08/27/2017   Procedure: LEFT TOTAL KNEE ARTHROPLASTY;  Surgeon: Moan Dempsey, MD;  Location: [facility];  Service: Orthopedics;  Laterality: Left;   WISDOM TOOTH EXTRACTION      Short Social History:  Social History   Tobacco Use   Smoking status: Never   Smokeless tobacco: Never  Substance Use Topics   Alcohol use: Yes    Alcohol/week: 0.0 standard drinks of alcohol    Comment:  very rarely    Allergies  Allergen Reactions   Cefaclor Rash    Because  of a history of documented adverse serious drug reaction;Medi Alert bracelet  is recommended   Penicillins     REACTION: rash Because of a history of documented adverse serious drug reaction;Medi Alert bracelet  is recommended Has patient had a PCN reaction causing immediate rash, facial/tongue/throat swelling, SOB or lightheadedness with hypotension: no Has patient had a PCN reaction causing severe rash involving mucus membranes or skin necrosis: {no Has patient had a PCN reaction that required hospitalization no Has patient had a PCN reaction occurring within the last 10 years: no If all of the above ans   Sulfa  Antibiotics Rash    01/22/13 after 9 days of Sulfa  Because of a history of documented adverse serious drug reaction;Medi Alert bracelet  is recommended   Advair Diskus [Fluticasone -Salmeterol]     Felt weird and flushing.    Current Outpatient Medications  Medication Sig Dispense Refill   acetaminophen  (TYLENOL ) 650 MG CR tablet Take 650 mg by mouth every 8 (eight) hours as needed for pain.     cetirizine (ZYRTEC) 10 MG tablet Take 10 mg by mouth daily as needed for allergies.     citalopram  (CELEXA ) 20 MG tablet Take 20 mg by mouth daily.     FLUAD 0.5 ML injection      levothyroxine  (SYNTHROID ) 112 MCG tablet TAKE 1 TABLET DAILY EXCEPT FOR 1 AND 1/2 TABLETS ON WEDNESDAY 90 tablet 2   metoprolol  tartrate (LOPRESSOR ) 25 MG tablet TAKE 1/2 TABLET TWICE A DAY BY MOUTH 90 tablet 1   mometasone  (NASONEX ) 50 MCG/ACT nasal spray PLACE 2 SPRAYS INTO THE NOSE DAILY. 51 each 1   Multiple Vitamins-Minerals (PRESERVISION AREDS 2+MULTI VIT PO) Take by mouth.     pantoprazole  (PROTONIX ) 20 MG tablet TAKE 1 TABLET BY MOUTH EVERY DAY 90 tablet 1   spironolactone  (ALDACTONE ) 25 MG tablet TAKE 1 TABLET BY MOUTH EVERY DAY 90 tablet 1   meloxicam  (MOBIC ) 7.5 MG tablet Take 1 tablet (7.5 mg total) by mouth daily. 30 tablet 0   mupirocin  ointment (BACTROBAN ) 2 % Apply 1 Application topically 2  (two) times daily. 30 g 0   Vitamin D , Ergocalciferol , (DRISDOL ) 1.25 MG (50000 UNIT) CAPS capsule Take 1 capsule (50,000 Units total) by mouth every 7 (seven) days. 12 capsule 0   No current facility-administered medications for this visit.    Review of Systems  Constitutional:  Constitutional negative. HENT: HENT negative.  Eyes: Eyes negative.  Respiratory: Respiratory negative.  Cardiovascular: Positive for claudication.  GI: Gastrointestinal negative.  Musculoskeletal: Positive for gait problem, leg pain and joint pain.  Hematologic: Hematologic/lymphatic negative.  Psychiatric: Psychiatric negative.        Objective:  Objective   Vitals:   06/18/24 1045  BP: 114/80  Pulse: 83  Temp: 97.9 F (36.6 C)  SpO2: 95%  Weight: 205 lb (93 kg)  Height: 5' 3 (1.6 m)   Body mass index is 36.31 kg/m.  Physical Exam HENT:     Head: Normocephalic.     Nose: Nose normal.  Eyes:     Pupils: Pupils are equal, round, and reactive to light.  Cardiovascular:     Rate and Rhythm: Normal rate.     Pulses: Normal pulses.  Pulmonary:     Effort: Pulmonary effort is normal.  Abdominal:     General: Abdomen is flat.  Musculoskeletal:     Cervical back: Normal range of motion.  Skin:    Capillary Refill: Capillary refill takes less than 2 seconds.  Neurological:     General: No focal deficit present.     Mental Status: She is alert.  Psychiatric:        Mood and Affect: Mood normal.        Thought Content: Thought content normal.        Judgment: Judgment normal.     Data: ABI Findings:  +---------+------------------+-----+---------+--------+  Right   Rt Pressure (mmHg)IndexWaveform Comment   +---------+------------------+-----+---------+--------+  Brachial 147                                       +---------+------------------+-----+---------+--------+  PTA     149               1.01 triphasic           +---------+------------------+-----+---------+--------+  DP      122               0.83 triphasic          +---------+------------------+-----+---------+--------+  Great Toe90                0.61 Normal             +---------+------------------+-----+---------+--------+   +---------+------------------+-----+---------+-------+  Left    Lt Pressure (mmHg)IndexWaveform Comment  +---------+------------------+-----+---------+-------+  Brachial 139                                      +---------+------------------+-----+---------+-------+  PTA     131               0.89 triphasic         +---------+------------------+-----+---------+-------+  DP      135               0.92 triphasic         +---------+------------------+-----+---------+-------+  Great Toe121               0.82 Normal            +---------+------------------+-----+---------+-------+   +-------+-----------+-----------+------------+------------+  ABI/TBIToday's ABIToday's TBIPrevious ABIPrevious TBI  +-------+-----------+-----------+------------+------------+  Right 1.01       0.61                                 +-------+-----------+-----------+------------+------------+  Left  0.92       0.82                                 +-------+-----------+-----------+------------+------------+       Summary:  Right: Resting right ankle-brachial index is within normal range.  Right toe pressure is >60 mmHg which suggests adequate perfusion for  healing.  Left: Resting left ankle-brachial index indicates mild left lower  extremity arterial disease. The left toe-brachial index is normal.      Assessment/Plan:     78 year old female with symptoms that are consistent with arterial insufficiency and claudication however her pulses are bounding the bilateral PTs today and her ABIs are only mildly decreased on the left with preserved toe pressure and triphasic.  As such I do  not think this is arterial etiology possibly neurogenic.  If she has continued symptoms that are unexplained we could consider more rare causes of intermittent arterial insufficiency but this would be exceedingly unlikely to be bilateral and occur concomitantly.  If issues persist would consider bilateral lower extremity duplex versus CT angio abdomen pelvis with bilateral lower extremity runoff for further evaluation.  Hopefully her symptoms resolve and she can then see me on an as-needed basis.     Penne Lonni [place] MD [facility]

## 2024-06-24 ENCOUNTER — Encounter: Payer: Self-pay | Admitting: Family Medicine

## 2024-06-24 ENCOUNTER — Ambulatory Visit: Admitting: Family Medicine

## 2024-06-24 VITALS — BP 112/74 | HR 77 | Temp 98.0°F | Ht 63.0 in | Wt 203.0 lb

## 2024-06-24 DIAGNOSIS — M79604 Pain in right leg: Secondary | ICD-10-CM

## 2024-06-24 DIAGNOSIS — G4733 Obstructive sleep apnea (adult) (pediatric): Secondary | ICD-10-CM

## 2024-06-24 DIAGNOSIS — E66812 Obesity, class 2: Secondary | ICD-10-CM | POA: Diagnosis not present

## 2024-06-24 DIAGNOSIS — M79605 Pain in left leg: Secondary | ICD-10-CM | POA: Diagnosis not present

## 2024-06-24 DIAGNOSIS — Z6835 Body mass index (BMI) 35.0-35.9, adult: Secondary | ICD-10-CM | POA: Diagnosis not present

## 2024-06-24 DIAGNOSIS — E559 Vitamin D deficiency, unspecified: Secondary | ICD-10-CM

## 2024-06-24 DIAGNOSIS — R7303 Prediabetes: Secondary | ICD-10-CM

## 2024-06-24 NOTE — Progress Notes (Signed)
 Office: [phone]  /  Fax: [phone]  WEIGHT SUMMARY AND BIOMETRICS  Weight Lost Since Last Visit: 0lb  Weight Gained Since Last Visit: 0lb   Vitals Temp: 98 F (36.7 C) BP: 112/74 Pulse Rate: 77 SpO2: 95 %   Anthropometric Measurements Height: 5' 3 (1.6 m) Weight: 203 lb (92.1 kg) BMI (Calculated): 35.97 Weight at Last Visit: 203lb Weight Lost Since Last Visit: 0lb Weight Gained Since Last Visit: 0lb Starting Weight: 222lb Total Weight Loss (lbs): 19 lb (8.618 kg)   Body Composition  Body Fat %: 47.1 % Fat Mass (lbs): 95.8 lbs Muscle Mass (lbs): 102 lbs Total Body Water  (lbs): 76.6 lbs Visceral Fat Rating : 18   Other Clinical Data Fasting: Yes Labs: No    HPI  Chief Complaint: OBESITY  Kristine Olson is here to discuss her progress with her obesity treatment plan. She is on the the Category 1 Plan and states she is following her eating plan approximately 0 % of the time. She states she is exercising 0 minutes 0 days per week.  Interval History:  Since last office visit she is down 0 lb This gives her a net weight loss of 19 lb in 1.5 years This is an 8.5% TBW loss She was seen by Dr Sheree 8/27 for bilateral leg pain and aching She had a negative arterial workup She has had to limit walking and has not been going to [employer] She is not following cat 1 meal plan and has had vacation this summer admitting to getting 'off track' She had COVID this summer  PHYSICAL EXAM:  Blood pressure 112/74, pulse 77, temperature 98 F (36.7 C), height 5' 3 (1.6 m), weight 203 lb (92.1 kg), SpO2 95%. Body mass index is 35.96 kg/m.  General: She is overweight, cooperative, alert, well developed, and in no acute distress. PSYCH: Has normal mood, affect and thought process.   Extremities: No edema.  Neurologic: No gross sensory or motor deficits. No tremors or fasciculations noted.    DIAGNOSTIC DATA REVIEWED:  BMET    Component Value Date/Time   NA  138 02/11/2024 1417   NA 142 10/10/2023 1010   K 4.7 02/11/2024 1417   CL 100 02/11/2024 1417   CO2 29 02/11/2024 1417   GLUCOSE 78 02/11/2024 1417   BUN 15 02/11/2024 1417   BUN 15 10/10/2023 1010   CREATININE 1.10 02/11/2024 1417   CREATININE 1.36 (H) 02/03/2021 1507   CALCIUM 9.2 02/11/2024 1417   GFRNONAA 43 (L) 08/29/2017 0554   GFRAA 50 (L) 08/29/2017 0554   Lab Results  Component Value Date   HGBA1C 6.2 02/11/2024   HGBA1C 5.6 08/23/2012   Lab Results  Component Value Date   INSULIN  18.5 10/10/2023   INSULIN  17.9 10/12/2022   Lab Results  Component Value Date   TSH 1.89 02/11/2024   CBC    Component Value Date/Time   WBC 10.3 02/11/2024 1417   RBC 4.27 02/11/2024 1417   HGB 11.6 (L) 02/11/2024 1417   HCT 35.8 (L) 02/11/2024 1417   PLT 346.0 02/11/2024 1417   MCV 84.0 02/11/2024 1417   MCH 25.5 (L) 02/03/2021 1507   MCHC 32.4 02/11/2024 1417   RDW 15.0 02/11/2024 1417   Iron Studies    Component Value Date/Time   IRON 26 (L) 11/14/2021 1405   TIBC 475 (H) 11/14/2021 1405   FERRITIN 5 (L) 11/14/2021 1405   IRONPCTSAT 5 (L) 11/14/2021 1405   Lipid Panel  Component Value Date/Time   CHOL 179 02/07/2023 1031   TRIG 85.0 02/07/2023 1031   HDL 58.10 02/07/2023 1031   CHOLHDL 3 02/07/2023 1031   VLDL 17.0 02/07/2023 1031   LDLCALC 104 (H) 02/07/2023 1031   LDLCALC 86 02/03/2021 1507   Hepatic Function Panel     Component Value Date/Time   PROT 7.2 02/11/2024 1417   PROT 7.0 10/10/2023 1010   ALBUMIN 3.7 02/11/2024 1417   ALBUMIN 4.3 10/10/2023 1010   AST 17 02/11/2024 1417   ALT 10 02/11/2024 1417   ALKPHOS 62 02/11/2024 1417   BILITOT 0.5 02/11/2024 1417   BILITOT 0.2 10/10/2023 1010   BILIDIR 0.1 02/11/2024 1417   IBILI 0.3 02/03/2021 1507      Component Value Date/Time   TSH 1.89 02/11/2024 1417   Nutritional Lab Results  Component Value Date   VD25OH 23.64 (L) 02/11/2024   VD25OH 35.2 10/10/2023   VD25OH 44.3 06/05/2023      ASSESSMENT AND PLAN  TREATMENT PLAN FOR OBESITY:  Recommended Dietary Goals  Kristine Olson is currently in the action stage of change. As such, her goal is to continue weight management plan. She has agreed to the Category 1 Plan. Copy of cat 1 meal plan and snack list given  Behavioral Intervention  We discussed the following Behavioral Modification Strategies today: increasing lean protein intake to established goals, increasing fiber rich foods, increasing water  intake , work on meal planning and preparation, keeping healthy foods at home, decreasing eating out or consumption of processed foods, and making healthy choices when eating convenient foods, practice mindfulness eating and understand the difference between hunger signals and cravings, work on managing stress, creating time for self-care and relaxation, continue to work on maintaining a reduced calorie state, getting the recommended amount of protein, incorporating whole foods, making healthy choices, staying well hydrated and practicing mindfulness when eating., and increase protein intake, fibrous foods (25 grams per day for women, 30 grams for men) and water  to improve satiety and decrease hunger signals. .  Additional resources provided today: NA  Recommended Physical Activity Goals  Kristine Olson has been advised to work up to 150 minutes of moderate intensity aerobic activity a week and strengthening exercises 2-3 times per week for cardiovascular health, weight loss maintenance and preservation of muscle mass.   She has agreed to Think about enjoyable ways to increase daily physical activity and overcoming barriers to exercise and Increase physical activity in their day and reduce sedentary time (increase NEAT).  Pharmacotherapy  We discussed possibly adding Zepbound for moderate OSA with AHI 38 (on CPAP)  ASSOCIATED CONDITIONS ADDRESSED TODAY  Action/Plan  Pre-diabetes Lab Results  Component Value Date   HGBA1C 6.2  02/11/2024  A1c has been rising and she has not been mindful of carb/ sugar intake and has been less physically active the past 6 mos.  She has not been taking metformin.  We discussed getting back on track with avoiding high sugar food and drinks.  Repeat lab today. -     Hemoglobin A1c  Vitamin D  deficiency Last vitamin D  Lab Results  Component Value Date   VD25OH 23.64 (L) 02/11/2024  She has been taking RX vitamin D  weekly with her PCP over the past 4 mos -     VITAMIN D  25 Hydroxy (Vit-D Deficiency, Fractures)  Pain in both lower extremities Vascular workup by Dr Sheree was negative for arterial cause of bilateral leg pain  A previous MRI L spine from 2003 does  reveal disc disease and now her bilateral leg pain and heaviness is limiting her ability to walk for exercise. She is concerned about leg length discrepancy Referral made to: -     Ambulatory referral to Physical Medicine Rehab  Class 2 obesity due to excess calories with body mass index (BMI) of 35.0 to 35.9 in adult, unspecified whether serious comorbidity present  OSA on CPAP She reports consistent CPAP use We reviewed the options of Zepbound use of moderate OSA including MOA, potential adverse SE and website information.  Continue to work on sleep hygiene and CPAP use nightly while actively working on weight loss.      No follow-ups on file.Kristine Olson She was informed of the importance of frequent follow up visits to maximize her success with intensive lifestyle modifications for her multiple health conditions.   ATTESTASTION STATEMENTS:  Reviewed by clinician on day of visit: allergies, medications, problem list, medical history, surgical history, family history, social history, and previous encounter notes.   I personally spent a total of 30 minutes in the care of the patient today including preparing to see the patient, counseling and educating, placing orders, documenting clinical information in the EHR, and coordinating  care.    Kristine Haddock, DO

## 2024-06-25 LAB — HEMOGLOBIN A1C
Est. average glucose Bld gHb Est-mCnc: 117 mg/dL
Hgb A1c MFr Bld: 5.7 % — ABNORMAL HIGH (ref 4.8–5.6)

## 2024-06-25 LAB — VITAMIN D 25 HYDROXY (VIT D DEFICIENCY, FRACTURES): Vit D, 25-Hydroxy: 53.1 ng/mL (ref 30.0–100.0)

## 2024-06-26 ENCOUNTER — Ambulatory Visit: Payer: Self-pay | Admitting: Family Medicine

## 2024-06-26 ENCOUNTER — Telehealth: Payer: Self-pay | Admitting: *Deleted

## 2024-06-26 NOTE — Telephone Encounter (Signed)
 Prior authorization done via cover my meds for patients zepbound. Waiting on determination.

## 2024-06-26 NOTE — Telephone Encounter (Signed)
 PA for Zepbound 2.5 has been approved. PA is now complete.      Message from Plan PA Case: [id number], Status: Approved, Coverage Starts on: 10/24/2023 12:00:00 AM, Coverage Ends on: 10/22/2024 12:00:00 AM. Questions? Contact [phone].SABRA Authorization Expiration Date: October 22, 2024.

## 2024-07-01 ENCOUNTER — Ambulatory Visit (INDEPENDENT_AMBULATORY_CARE_PROVIDER_SITE_OTHER): Admitting: *Deleted

## 2024-07-01 VITALS — Ht 63.0 in | Wt 203.0 lb

## 2024-07-01 DIAGNOSIS — Z0000 Encounter for general adult medical examination without abnormal findings: Secondary | ICD-10-CM | POA: Diagnosis not present

## 2024-07-01 NOTE — Patient Instructions (Signed)
 Ms. Kristine Olson , Thank you for taking time to come for your Medicare Wellness Visit. I appreciate your ongoing commitment to your health goals. Please review the following plan we discussed and let me know if I can assist you in the future.   Screening recommendations/referrals: Colonoscopy: no longer required Mammogram: up to date Bone Density: up to date Recommended yearly ophthalmology/optometry visit for glaucoma screening and checkup Recommended yearly dental visit for hygiene and checkup  Vaccinations: Influenza vaccine: Education provided Pneumococcal vaccine: up to date Tdap vaccine: Education provided Shingles vaccine: Education provided      Preventive Care 65 Years and Older, Female Preventive care refers to lifestyle choices and visits with your health care provider that can promote health and wellness. What does preventive care include? A yearly physical exam. This is also called an annual well check. Dental exams once or twice a year. Routine eye exams. Ask your health care provider how often you should have your eyes checked. Personal lifestyle choices, including: Daily care of your teeth and gums. Regular physical activity. Eating a healthy diet. Avoiding tobacco and drug use. Limiting alcohol use. Practicing safe sex. Taking low-dose aspirin  every day. Taking vitamin and mineral supplements as recommended by your health care provider. What happens during an annual well check? The services and screenings done by your health care provider during your annual well check will depend on your age, overall health, lifestyle risk factors, and family history of disease. Counseling  Your health care provider may ask you questions about your: Alcohol use. Tobacco use. Drug use. Emotional well-being. Home and relationship well-being. Sexual activity. Eating habits. History of falls. Memory and ability to understand (cognition). Work and work Astronomer. Reproductive  health. Screening  You may have the following tests or measurements: Height, weight, and BMI. Blood pressure. Lipid and cholesterol levels. These may be checked every 5 years, or more frequently if you are over 56 years old. Skin check. Lung cancer screening. You may have this screening every year starting at age 47 if you have a 30-pack-year history of smoking and currently smoke or have quit within the past 15 years. Fecal occult blood test (FOBT) of the stool. You may have this test every year starting at age 42. Flexible sigmoidoscopy or colonoscopy. You may have a sigmoidoscopy every 5 years or a colonoscopy every 10 years starting at age 14. Hepatitis C blood test. Hepatitis B blood test. Sexually transmitted disease (STD) testing. Diabetes screening. This is done by checking your blood sugar (glucose) after you have not eaten for a while (fasting). You may have this done every 1-3 years. Bone density scan. This is done to screen for osteoporosis. You may have this done starting at age 52. Mammogram. This may be done every 1-2 years. Talk to your health care provider about how often you should have regular mammograms. Talk with your health care provider about your test results, treatment options, and if necessary, the need for more tests. Vaccines  Your health care provider may recommend certain vaccines, such as: Influenza vaccine. This is recommended every year. Tetanus, diphtheria, and acellular pertussis (Tdap, Td) vaccine. You may need a Td booster every 10 years. Zoster vaccine. You may need this after age 54. Pneumococcal 13-valent conjugate (PCV13) vaccine. One dose is recommended after age 31. Pneumococcal polysaccharide (PPSV23) vaccine. One dose is recommended after age 47. Talk to your health care provider about which screenings and vaccines you need and how often you need them. This information is not  intended to replace advice given to you by your health care provider.  Make sure you discuss any questions you have with your health care provider. Document Released: 11/05/2015 Document Revised: 06/28/2016 Document Reviewed: 08/10/2015 Elsevier Interactive Patient Education  2017 [employer].  Fall Prevention in the Home Falls can cause injuries. They can happen to people of all ages. There are many things you can do to make your home safe and to help prevent falls. What can I do on the outside of my home? Regularly fix the edges of walkways and driveways and fix any cracks. Remove anything that might make you trip as you walk through a door, such as a raised step or threshold. Trim any bushes or trees on the path to your home. Use bright outdoor lighting. Clear any walking paths of anything that might make someone trip, such as rocks or tools. Regularly check to see if handrails are loose or broken. Make sure that both sides of any steps have handrails. Any raised decks and porches should have guardrails on the edges. Have any leaves, snow, or ice cleared regularly. Use sand or salt on walking paths during winter. Clean up any spills in your garage right away. This includes oil or grease spills. What can I do in the bathroom? Use night lights. Install grab bars by the toilet and in the tub and shower. Do not use towel bars as grab bars. Use non-skid mats or decals in the tub or shower. If you need to sit down in the shower, use a plastic, non-slip stool. Keep the floor dry. Clean up any water  that spills on the floor as soon as it happens. Remove soap buildup in the tub or shower regularly. Attach bath mats securely with double-sided non-slip rug tape. Do not have throw rugs and other things on the floor that can make you trip. What can I do in the bedroom? Use night lights. Make sure that you have a light by your bed that is easy to reach. Do not use any sheets or blankets that are too big for your bed. They should not hang down onto the floor. Have a  firm chair that has side arms. You can use this for support while you get dressed. Do not have throw rugs and other things on the floor that can make you trip. What can I do in the kitchen? Clean up any spills right away. Avoid walking on wet floors. Keep items that you use a lot in easy-to-reach places. If you need to reach something above you, use a strong step stool that has a grab bar. Keep electrical cords out of the way. Do not use floor polish or wax that makes floors slippery. If you must use wax, use non-skid floor wax. Do not have throw rugs and other things on the floor that can make you trip. What can I do with my stairs? Do not leave any items on the stairs. Make sure that there are handrails on both sides of the stairs and use them. Fix handrails that are broken or loose. Make sure that handrails are as long as the stairways. Check any carpeting to make sure that it is firmly attached to the stairs. Fix any carpet that is loose or worn. Avoid having throw rugs at the top or bottom of the stairs. If you do have throw rugs, attach them to the floor with carpet tape. Make sure that you have a light switch at the top of the stairs  and the bottom of the stairs. If you do not have them, ask someone to add them for you. What else can I do to help prevent falls? Wear shoes that: Do not have high heels. Have rubber bottoms. Are comfortable and fit you well. Are closed at the toe. Do not wear sandals. If you use a stepladder: Make sure that it is fully opened. Do not climb a closed stepladder. Make sure that both sides of the stepladder are locked into place. Ask someone to hold it for you, if possible. Clearly mark and make sure that you can see: Any grab bars or handrails. First and last steps. Where the edge of each step is. Use tools that help you move around (mobility aids) if they are needed. These include: Canes. Walkers. Scooters. Crutches. Turn on the lights when you  go into a dark area. Replace any light bulbs as soon as they burn out. Set up your furniture so you have a clear path. Avoid moving your furniture around. If any of your floors are uneven, fix them. If there are any pets around you, be aware of where they are. Review your medicines with your doctor. Some medicines can make you feel dizzy. This can increase your chance of falling. Ask your doctor what other things that you can do to help prevent falls. This information is not intended to replace advice given to you by your health care provider. Make sure you discuss any questions you have with your health care provider. Document Released: 08/05/2009 Document Revised: 03/16/2016 Document Reviewed: 11/13/2014 Elsevier Interactive Patient Education  2017 [employer].

## 2024-07-01 NOTE — Progress Notes (Signed)
 Subjective:   Kristine Olson is a 78 y.o. female who presents for Medicare Annual (Subsequent) preventive examination.  Visit Complete: Virtual I connected with  Kristine Olson on 07/01/24 by a audio enabled telemedicine application and verified that I am speaking with the correct person using two identifiers.  Patient Location: Home  Provider Location: Home Office  I discussed the limitations of evaluation and management by telemedicine. The patient expressed understanding and agreed to proceed.  Vital Signs: Because this visit was a virtual/telehealth visit, some criteria may be missing or patient reported. Any vitals not documented were not able to be obtained and vitals that have been documented are patient reported.  Cardiac Risk Factors include: advanced age (>30men, >34 women);obesity (BMI >30kg/m2)     Objective:    Today's Vitals   07/01/24 1558  Weight: 203 lb (92.1 kg)  Height: 5' 3 (1.6 m)   Body mass index is 35.96 kg/m.     07/01/2024    3:56 PM 01/25/2023    2:03 PM 10/06/2021    2:39 PM 02/15/2021    8:52 AM 06/01/2020    1:38 PM 04/24/2018    9:33 AM 08/27/2017    4:00 PM  Advanced Directives  Does Patient Have a Medical Advance Directive? Yes Yes Yes Yes Yes Yes  Yes   Type of Sales promotion account executive of [employer] Power of [place];Living will Healthcare Power of [place];Living will;Out of facility DNR (pink MOST or yellow form) Healthcare Power of [place];Living will Healthcare Power of [place];Living will Healthcare Power of [place];Living will  Does patient want to make changes to medical advance directive?       No - Patient declined   Copy of Healthcare Power of Attorney in Chart? No - copy requested No - copy requested No - copy requested  No - copy requested  No - copy requested      Data saved with a previous flowsheet row definition    Current Medications (verified) Outpatient Encounter  Medications as of 07/01/2024  Medication Sig   acetaminophen  (TYLENOL ) 650 MG CR tablet Take 650 mg by mouth every 8 (eight) hours as needed for pain.   cetirizine (ZYRTEC) 10 MG tablet Take 10 mg by mouth daily as needed for allergies.   citalopram  (CELEXA ) 20 MG tablet Take 20 mg by mouth daily.   FLUAD 0.5 ML injection    levothyroxine  (SYNTHROID ) 112 MCG tablet TAKE 1 TABLET DAILY EXCEPT FOR 1 AND 1/2 TABLETS ON WEDNESDAY   metoprolol  tartrate (LOPRESSOR ) 25 MG tablet TAKE 1/2 TABLET TWICE A DAY BY MOUTH   mometasone  (NASONEX ) 50 MCG/ACT nasal spray PLACE 2 SPRAYS INTO THE NOSE DAILY.   Multiple Vitamins-Minerals (PRESERVISION AREDS 2+MULTI VIT PO) Take by mouth.   pantoprazole  (PROTONIX ) 20 MG tablet TAKE 1 TABLET BY MOUTH EVERY DAY   spironolactone  (ALDACTONE ) 25 MG tablet TAKE 1 TABLET BY MOUTH EVERY DAY   No facility-administered encounter medications on file as of 07/01/2024.    Allergies (verified) Cefaclor, Penicillins, Sulfa  antibiotics, and Advair diskus [fluticasone -salmeterol]   History: Past Medical History:  Diagnosis Date   Acute pyloric channel ulcer with hemorrhage on Xarelto     Anemia    history of anemia after bleeding ulcer   Arthritis    ostearthritis - right  medial knee   Back pain    Cataract    beginning cataracts   Complication of anesthesia    was given a medication while waiting  to go to surgery that caused burning sensation on scalp was given another medication and the burning went away   COVID-19 2022   Depression    Edema    GERD (gastroesophageal reflux disease)    heartburn daily-TUMS helpful   Hemorrhoid    History of blood transfusion    2 units PRBC   History of stomach ulcers    Hx of sinus tachycardia    rarely any problems since metoprolol  use   Hyperlipidemia    patient unaware, has never taking medication   Hypertension    Hypothyroidism    Joint pain    Osteoporosis    osteopenia   Sleep apnea    uses c pap   SOB  (shortness of breath)    Varicose vein of leg    Vitamin D  deficiency    Past Surgical History:  Procedure Laterality Date   BIOPSY BREAST     2 biopsies & 2 aspirations   CESAREAN SECTION     CLOSED REDUCTION TOE FRACTURE     COLONOSCOPY  2015   COLONOSCOPY W/ POLYPECTOMY  multiple since 2007   adenomas, diverticulosis;last 2015   ESOPHAGOGASTRODUODENOSCOPY (EGD) WITH PROPOFOL  N/A 05/29/2016   Procedure: ESOPHAGOGASTRODUODENOSCOPY (EGD) WITH PROPOFOL ;  Surgeon: Lupita FORBES Commander, MD;  Location: [facility];  Service: Endoscopy;  Laterality: N/A;   HAMMER TOE SURGERY     PARTIAL KNEE ARTHROPLASTY Right 05/22/2016   Procedure: RIGHT KNEE MEDIAL UNICOMPARTMENTAL ARTHROPLASTY;  Surgeon: Dempsey Moan, MD;  Location: [facility];  Service: Orthopedics;  Laterality: Right;   TONSILLECTOMY AND ADENOIDECTOMY     TOTAL KNEE ARTHROPLASTY Left 08/27/2017   Procedure: LEFT TOTAL KNEE ARTHROPLASTY;  Surgeon: Moan Dempsey, MD;  Location: [facility];  Service: Orthopedics;  Laterality: Left;   WISDOM TOOTH EXTRACTION     Family History  Problem Relation Age of Onset   Hypertension Mother    Osteoporosis Mother    Thyroid  disease Mother    Rheum arthritis Mother    Hypertension Father    Alcohol abuse Father    Liver disease Father    Obesity Father    Alcoholism Father    Depression Father    Melanoma Brother    Cancer Maternal Grandmother    Heart attack Maternal Grandfather        > 48   Multiple sclerosis Daughter 35   Depression Daughter    Bipolar disorder Daughter    Stroke Maternal Aunt         X 3    Diabetes Maternal Aunt    Deep vein thrombosis Paternal Aunt    Other Paternal Aunt        Phlebitis without deep venous thrombosis   Colon cancer Neg Hx    Colon polyps Neg Hx    Esophageal cancer Neg Hx    Rectal cancer Neg Hx    Stomach cancer Neg Hx    Social History   Socioeconomic History   Marital status: Married    Spouse name: Not on file   Number of children: 3   Years  of education: Not on file   Highest education level: Master's degree (e.g., MA, MS, MEng, MEd, MSW, MBA)  Occupational History   Occupation: Retired  Tobacco Use   Smoking status: Never   Smokeless tobacco: Never  Vaping Use   Vaping status: Never Used  Substance and Sexual Activity   Alcohol use: Yes    Alcohol/week: 0.0 standard drinks of alcohol  Comment:  very rarely   Drug use: No   Sexual activity: Yes    Birth control/protection: Post-menopausal  Other Topics Concern   Not on file  Social History Narrative   3 biological children (Set of Identical twins)    3 Step children    Social Drivers of Corporate investment banker Strain: Low Risk  (07/01/2024)   Overall Financial Resource Strain (CARDIA)    Difficulty of Paying Living Expenses: Not hard at all  Food Insecurity: No Food Insecurity (07/01/2024)   Hunger Vital Sign    Worried About Running Out of Food in the Last Year: Never true    Ran Out of Food in the Last Year: Never true  Transportation Needs: Unknown (07/01/2024)   PRAPARE - Administrator, Civil Service (Medical): Not on file    Lack of Transportation (Non-Medical): No  Physical Activity: Inactive (07/01/2024)   Exercise Vital Sign    Days of Exercise per Week: 0 days    Minutes of Exercise per Session: 0 min  Stress: No Stress Concern Present (07/01/2024)   [employer] of Occupational Health - Occupational Stress Questionnaire    Feeling of Stress: Not at all  Social Connections: Socially Integrated (07/01/2024)   Social Connection and Isolation Panel    Frequency of Communication with Friends and Family: More than three times a week    Frequency of Social Gatherings with Friends and Family: Twice a week    Attends Religious Services: More than 4 times per year    Active Member of [employer] or Organizations: Yes    Attends Engineer, structural: More than 4 times per year    Marital Status: Married    Tobacco Counseling Counseling  given: Not Answered   Clinical Intake:  Pre-visit preparation completed: Yes  Pain : No/denies pain     Diabetes: No  How often do you need to have someone help you when you read instructions, pamphlets, or other written materials from your doctor or pharmacy?: 1 - Never  Interpreter Needed?: No  Information entered by :: Mliss Graff LPN   Activities of Daily Living    07/01/2024    3:58 PM  In your present state of health, do you have any difficulty performing the following activities:  Hearing? 1  Vision? 0  Difficulty concentrating or making decisions? 0  Walking or climbing stairs? 0  Dressing or bathing? 0  Doing errands, shopping? 0  Preparing Food and eating ? N  Using the Toilet? N  In the past six months, have you accidently leaked urine? N  Do you have problems with loss of bowel control? N  Managing your Medications? N  Managing your Finances? N  Housekeeping or managing your Housekeeping? N    Patient Care Team: Mahlon Comer BRAVO, MD as PCP - General (Family Medicine) Sammye Hamilton, MD as Referring Physician (Obstetrics and Gynecology) Avram Lupita BRAVO, MD as Consulting Physician (Gastroenterology) Kelly Stabs (Psychiatry) Melodi Lerner, MD as Consulting Physician (Orthopedic Surgery) Neysa Reggy BIRCH, MD as Consulting Physician (Pulmonary Disease) Lavona Agent, MD as Consulting Physician (Cardiology) Tat, Stabs RAMAN, DO as Consulting Physician (Neurology)  Indicate any recent Medical Services you may have received from other than [facility] providers in the past year (date may be approximate).     Assessment:   This is a routine wellness examination for [place].  Hearing/Vision screen Hearing Screening - Comments:: Bilateral hearing aids Vision Screening - Comments:: [facility]  Up to date  Goals Addressed             This Visit's Progress    Increase physical activity   Not on track    Patient Stated   Not on track    Lose  weight with NOOM     Weight (lb) < 200 lb (90.7 kg)   203 lb (92.1 kg)      Depression Screen    07/01/2024    4:00 PM 02/11/2024    1:44 PM 02/07/2023    9:53 AM 01/29/2023    9:49 AM 01/25/2023    2:07 PM 08/07/2022   10:06 AM 02/08/2022   10:16 AM  PHQ 2/9 Scores  PHQ - 2 Score 0 0 0 0 0 0 0  PHQ- 9 Score 0 3 0 1 0 2 1    Fall Risk    07/01/2024    3:58 PM 04/30/2024    9:37 AM 02/11/2024    1:44 PM 01/07/2024    9:11 AM 11/22/2023    1:36 PM  Fall Risk   Falls in the past year? 0 0 0 0 0  Number falls in past yr: 0 0 0 0 0  Injury with Fall? 0 0 0 0 0  Risk for fall due to :    No Fall Risks   Follow up Falls evaluation completed;Education provided;Falls prevention discussed        MEDICARE RISK AT HOME: Medicare Risk at Home Any stairs in or around the home?: Yes If so, are there any without handrails?: No Home free of loose throw rugs in walkways, pet beds, electrical cords, etc?: Yes Adequate lighting in your home to reduce risk of falls?: Yes Life alert?: No Use of a cane, walker or w/c?: No Grab bars in the bathroom?: No Shower chair or bench in shower?: Yes Elevated toilet seat or a handicapped toilet?: No  TIMED UP AND GO:  Was the test performed?  No    Cognitive Function:    04/24/2018    9:36 AM  MMSE - Mini Mental State Exam  Orientation to time 5  Orientation to Place 5  Registration 3  Attention/ Calculation 5  Recall 3  Language- name 2 objects 2  Language- repeat 1  Language- follow 3 step command 3  Language- read & follow direction 1  Write a sentence 1  Copy design 1  Total score 30        07/01/2024    3:57 PM 01/25/2023    2:05 PM 02/08/2022   10:18 AM  6CIT Screen  What Year? 0 points 0 points 0 points  What month? 0 points 0 points 0 points  What time? 0 points  0 points  Count back from 20 0 points 0 points 0 points  Months in reverse 0 points 0 points 0 points  Repeat phrase 0 points 0 points 0 points  Total Score 0 points  0  points    Immunizations Immunization History  Administered Date(s) Administered   Fluad Quad(high Dose 65+) 07/07/2019, 10/29/2020, 09/05/2021   Hepatitis A, Adult 10/04/2012   Hepatitis B, ADULT 10/04/2012   INFLUENZA, HIGH DOSE SEASONAL PF 09/06/2018   IPV 10/04/2012   Influenza,inj,Quad PF,6+ Mos 10/09/2016, 07/24/2017, 09/06/2018   Influenza-Unspecified 06/23/2012, 06/27/2017   Meningococcal Conjugate 10/04/2012   PFIZER(Purple Top)SARS-COV-2 Vaccination 11/12/2019, 12/03/2019, 09/06/2020   Pneumococcal Conjugate-13 04/17/2016   Pneumococcal Polysaccharide-23 04/20/2017   Td 10/24/2003   Tdap 10/04/2012    TDAP status: Due, Education  has been provided regarding the importance of this vaccine. Advised may receive this vaccine at local pharmacy or Health Dept. Aware to provide a copy of the vaccination record if obtained from local pharmacy or Health Dept. Verbalized acceptance and understanding.  Flu Vaccine status: Due, Education has been provided regarding the importance of this vaccine. Advised may receive this vaccine at local pharmacy or Health Dept. Aware to provide a copy of the vaccination record if obtained from local pharmacy or Health Dept. Verbalized acceptance and understanding.  Pneumococcal vaccine status: Up to date  Covid-19 vaccine status: Declined, Education has been provided regarding the importance of this vaccine but patient still declined. Advised may receive this vaccine at local pharmacy or Health Dept.or vaccine clinic. Aware to provide a copy of the vaccination record if obtained from local pharmacy or Health Dept. Verbalized acceptance and understanding.  Qualifies for Shingles Vaccine? Yes   Zostavax completed No   Shingrix Completed?: No.    Education has been provided regarding the importance of this vaccine. Patient has been advised to call insurance company to determine out of pocket expense if they have not yet received this vaccine. Advised may  also receive vaccine at local pharmacy or Health Dept. Verbalized acceptance and understanding.  Screening Tests Health Maintenance  Topic Date Due   Zoster Vaccines- Shingrix (1 of 2) Never done   DTaP/Tdap/Td (3 - Td or Tdap) 10/04/2022   Influenza Vaccine  05/23/2024   DEXA SCAN  02/12/2025   Colonoscopy  03/05/2025   MAMMOGRAM  05/07/2025   Medicare Annual Wellness (AWV)  07/01/2025   Pneumococcal Vaccine: 50+ Years  Completed   Hepatitis C Screening  Completed   HPV VACCINES  Aged Out   Meningococcal B Vaccine  Aged Out   Hepatitis B Vaccines 19-59 Average Risk  Discontinued   COVID-19 Vaccine  Discontinued    Health Maintenance  Health Maintenance Due  Topic Date Due   Zoster Vaccines- Shingrix (1 of 2) Never done   DTaP/Tdap/Td (3 - Td or Tdap) 10/04/2022   Influenza Vaccine  05/23/2024    Colorectal cancer screening: No longer required.   Mammogram status: Completed  . Repeat every year  Bone Density status: Completed 2024. Results reflect: Bone density results: OSTEOPENIA. Repeat every 2 years.  Lung Cancer Screening: (Low Dose CT Chest recommended if Age 14-80 years, 20 pack-year currently smoking OR have quit w/in 15years.) does not qualify.   Lung Cancer Screening Referral:   Additional Screening:  Hepatitis C Screening: does not qualify; Completed 2023  Vision Screening: Recommended annual ophthalmology exams for early detection of glaucoma and other disorders of the eye. Is the patient up to date with their annual eye exam?  Yes  Who is the provider or what is the name of the office in which the patient attends annual eye exams? [facility] If pt is not established with a provider, would they like to be referred to a provider to establish care? No .   Dental Screening: Recommended annual dental exams for proper oral hygiene    Community Resource Referral / Chronic Care Management: CRR required this visit?  No   CCM required this visit?  No      Plan:     I have personally reviewed and noted the following in the patient's chart:   Medical and social history Use of alcohol, tobacco or illicit drugs  Current medications and supplements including opioid prescriptions. Patient is not currently taking opioid prescriptions. Functional ability and  status Nutritional status Physical activity Advanced directives List of other physicians Hospitalizations, surgeries, and ER visits in previous 12 months Vitals Screenings to include cognitive, depression, and falls Referrals and appointments  In addition, I have reviewed and discussed with patient certain preventive protocols, quality metrics, and best practice recommendations. A written personalized care plan for preventive services as well as general preventive health recommendations were provided to patient.     Mliss Graff, LPN   [id number]   After Visit Summary: (MyChart) Due to this being a telephonic visit, the after visit summary with patients personalized plan was offered to patient via MyChart   Nurse Notes:

## 2024-07-08 ENCOUNTER — Encounter: Payer: Self-pay | Admitting: Physical Medicine & Rehabilitation

## 2024-07-14 DIAGNOSIS — F411 Generalized anxiety disorder: Secondary | ICD-10-CM | POA: Diagnosis not present

## 2024-07-16 ENCOUNTER — Ambulatory Visit: Attending: Internal Medicine | Admitting: Internal Medicine

## 2024-07-16 ENCOUNTER — Encounter: Payer: Self-pay | Admitting: Internal Medicine

## 2024-07-16 VITALS — BP 121/63 | HR 80 | Temp 98.2°F | Resp 16 | Ht 63.0 in | Wt 206.6 lb

## 2024-07-16 DIAGNOSIS — M79642 Pain in left hand: Secondary | ICD-10-CM

## 2024-07-16 DIAGNOSIS — R768 Other specified abnormal immunological findings in serum: Secondary | ICD-10-CM | POA: Diagnosis not present

## 2024-07-16 DIAGNOSIS — E88819 Insulin resistance, unspecified: Secondary | ICD-10-CM

## 2024-07-16 DIAGNOSIS — M79641 Pain in right hand: Secondary | ICD-10-CM

## 2024-07-16 DIAGNOSIS — M170 Bilateral primary osteoarthritis of knee: Secondary | ICD-10-CM | POA: Diagnosis not present

## 2024-07-16 NOTE — Patient Instructions (Addendum)
 Your skin rashes are suggestive for livedo reticularis, which can be inflammatory but for some people is benign and related to constriction and relaxation of the blood vessels under the skin. Overall your reported symptoms may characterize a reactive arthritis but is hard to say for sure with less symptoms present at the time of our visit. I agree wiith as needed use of oral or topical NSAIDs such as meloxicam  or voltaren for your osteoarthritis symptoms.  For osteoarthritis several treatments may be beneficial:  - Topical antiinflammatory medicine such as diclofenac or Voltaren can be applied to affected area as needed. Topical analgesics containing menthol , or lidocaine  can be tried. - Oral nonsteroidal antiinflammatory drugs (NSAIDs) such as ibuprofen, aleve, celebrex, or mobic  are usually helpful for osteoarthritis. - Turmeric has some antiinflammatory effect similar to NSAIDs and may help, if taken as a supplement should not be taken above recommended doses.  - Compressive gloves or sleeve can be helpful to support the joint especially if hurting or swelling with certain activities. - Physical therapy referral can discuss exercises or activity modification to improve symptoms or strength if needed. - Local steroid injection is an option if symptoms become worse and not controlled by the above options.

## 2024-07-16 NOTE — Progress Notes (Signed)
 Office Visit Note  Patient: Kristine Olson             Date of Birth: [birth year]           MRN: [id number]             PCP: Mahlon Comer BRAVO, MD Referring: Mahlon Comer BRAVO, MD Visit Date: 07/16/2024 Occupation: Data Unavailable  Subjective:  New Patient (Initial Visit) (Patient has some of an only discoloration of her arms. Has pictures to show.), Pain (In the calfs ), Joint Pain (Hands and arms ), and Abnormal Lab   Discussed the use of AI scribe software for clinical note transcription with the patient, who gave verbal consent to proceed.  History of Present Illness   Kristine Olson is a 78 year old female who presents with joint pain and skin changes and abnormal labs with positive ANA.  She has been experiencing joint pain and skin changes since February. Initially, she had severe hand pain that resolved in a few days, followed by lower arm pain exacerbated by cold exposure. This pain was intermittent and lasted for a few months.  She describes severe calf pain that limits her ability to walk long distances, such as around a grocery store or a small boutique. The pain improves quickly with rest, typically within five minutes, and is not associated with swelling. This pain is distinct from her chronic knee pain; she has had both knees replaced, one partially. She has a history of knee pain for years, with both knees replaced, one partially. The knee pain does not limit her walking as much as the calf pain does. She has tried meloxicam  7.5 mg for joint pain, which she took for about a week to ten days, but found it hard to assess its effectiveness due to interactions with other medications.  She mentions a history of skin changes, described as a mottled or web-like rash, which occurs occasionally and is provoked by cold exposure. She has not seen a dermatologist yet due to long wait times. She also reports a history of a positive ANA test, but no specific diagnosis  was made.  She had COVID about a month ago and experienced low-grade fevers of 27F at night for about five days, without other symptoms. No discoloration of fingers or toes, and no significant swelling in the neck or armpits.  Her family history includes a daughter with multiple sclerosis and a mother with rheumatoid arthritis. She expresses concern about these conditions in relation to her symptoms.     Labs reviewed 01/2024 ANA 1:80 cytoplasmic  Activities of Daily Living:  Patient reports morning stiffness for 5-10 minutes.   Patient Denies nocturnal pain.  Difficulty dressing/grooming: Denies Difficulty climbing stairs: Reports Difficulty getting out of chair: Denies Difficulty using hands for taps, buttons, cutlery, and/or writing: Denies  Review of Systems  Constitutional:  Positive for fatigue.  HENT:  Positive for mouth dryness. Negative for mouth sores.   Eyes:  Negative for dryness.  Respiratory:  Negative for shortness of breath.   Cardiovascular:  Negative for chest pain and palpitations.  Gastrointestinal:  Positive for constipation and diarrhea. Negative for blood in stool.  Endocrine: Negative for increased urination.  Genitourinary:  Positive for involuntary urination.  Musculoskeletal:  Positive for joint pain, joint pain, myalgias, morning stiffness, muscle tenderness and myalgias. Negative for gait problem, joint swelling and muscle weakness.  Skin:  Positive for color change and sensitivity to sunlight. Negative for rash and hair  loss.  Allergic/Immunologic: Negative for susceptible to infections.  Neurological:  Positive for light-headedness. Negative for dizziness and headaches.  Hematological:  Negative for swollen glands.  Psychiatric/Behavioral:  Positive for depressed mood. Negative for sleep disturbance. The patient is not nervous/anxious.     PMFS History:  Patient Active Problem List   Diagnosis Date Noted   Positive ANA (antinuclear antibody)  07/16/2024   Bilateral hand pain 03/26/2024   Morbid obesity (HCC) with starting BMI 39 07/03/2023   B12 deficiency 10/26/2022   Insulin  resistance 10/26/2022   Pre-diabetes 10/26/2022   Other fatigue 10/12/2022   SOBOE (shortness of breath on exertion) 10/12/2022   Depression screen 10/12/2022   Essential hypertension 09/11/2022   OSA on CPAP 09/11/2022   Anemia, iron deficiency 05/29/2016   OA (osteoarthritis) of knee 05/22/2016   Physical exam 04/17/2016   Hemorrhoids 12/03/2015   Elevated serum creatinine 11/27/2014   Positional vertigo 10/18/2012   Fasting hyperglycemia 08/23/2012   Vitamin D  deficiency 08/23/2012   Osteopenia 07/06/2011   Menopausal and postmenopausal disorder 03/01/2010   GERD (gastroesophageal reflux disease) 03/05/2009   Hypothyroidism 11/20/2007   IBS 11/20/2007   History of cardiovascular disorder 11/20/2007   History of colonic polyps 11/20/2007    Past Medical History:  Diagnosis Date   Acute pyloric channel ulcer with hemorrhage on Xarelto     Anemia    history of anemia after bleeding ulcer   Arthritis    ostearthritis - right  medial knee   Back pain    Cataract    beginning cataracts   Complication of anesthesia    was given a medication while waiting to go to surgery that caused burning sensation on scalp was given another medication and the burning went away   COVID-19 2022   Depression    Edema    GERD (gastroesophageal reflux disease)    heartburn daily-TUMS helpful   Hemorrhoid    History of blood transfusion    2 units PRBC   History of stomach ulcers    Hx of sinus tachycardia    rarely any problems since metoprolol  use   Hyperlipidemia    patient unaware, has never taking medication   Hypertension    Hypothyroidism    Joint pain    Osteoporosis    osteopenia   Sleep apnea    uses c pap   SOB (shortness of breath)    Varicose vein of leg    Vitamin D  deficiency     Family History  Problem Relation Age of Onset    Hypertension Mother    Osteoporosis Mother    Thyroid  disease Mother    Rheum arthritis Mother    Arthritis Mother    Hypertension Father    Alcohol abuse Father    Liver disease Father    Obesity Father    Alcoholism Father    Depression Father    Melanoma Brother    Cancer Maternal Grandmother    Heart attack Maternal Grandfather        > 35   Multiple sclerosis Daughter 70   Depression Daughter    Bipolar disorder Daughter    Stroke Maternal Aunt         X 3    Diabetes Maternal Aunt    Deep vein thrombosis Paternal Aunt    Other Paternal Aunt        Phlebitis without deep venous thrombosis   Colon cancer Neg Hx    Colon polyps Neg Hx  Esophageal cancer Neg Hx    Rectal cancer Neg Hx    Stomach cancer Neg Hx    Past Surgical History:  Procedure Laterality Date   BIOPSY BREAST     2 biopsies & 2 aspirations   CESAREAN SECTION     CLOSED REDUCTION TOE FRACTURE     COLONOSCOPY  2015   COLONOSCOPY W/ POLYPECTOMY  multiple since 2007   adenomas, diverticulosis;last 2015   ESOPHAGOGASTRODUODENOSCOPY (EGD) WITH PROPOFOL  N/A 05/29/2016   Procedure: ESOPHAGOGASTRODUODENOSCOPY (EGD) WITH PROPOFOL ;  Surgeon: Lupita FORBES Commander, MD;  Location: [facility];  Service: Endoscopy;  Laterality: N/A;   HAMMER TOE SURGERY     JOINT REPLACEMENT     PARTIAL KNEE ARTHROPLASTY Right 05/22/2016   Procedure: RIGHT KNEE MEDIAL UNICOMPARTMENTAL ARTHROPLASTY;  Surgeon: Dempsey Moan, MD;  Location: [facility];  Service: Orthopedics;  Laterality: Right;   TONSILLECTOMY AND ADENOIDECTOMY     TOTAL KNEE ARTHROPLASTY Left 08/27/2017   Procedure: LEFT TOTAL KNEE ARTHROPLASTY;  Surgeon: Moan Dempsey, MD;  Location: [facility];  Service: Orthopedics;  Laterality: Left;   WISDOM TOOTH EXTRACTION     Social History   Tobacco Use   Smoking status: Never    Passive exposure: Past   Smokeless tobacco: Never  Vaping Use   Vaping status: Never Used  Substance Use Topics   Alcohol use: Yes     Alcohol/week: 0.0 standard drinks of alcohol    Comment:  very rarely   Drug use: No   Social History   Social History Narrative   3 biological children (Set of Identical twins)    3 Step children      Immunization History  Administered Date(s) Administered   Fluad Quad(high Dose 65+) 07/07/2019, 10/29/2020, 09/05/2021   Hepatitis A, Adult 10/04/2012   Hepatitis B, ADULT 10/04/2012   INFLUENZA, HIGH DOSE SEASONAL PF 09/06/2018   IPV 10/04/2012   Influenza,inj,Quad PF,6+ Mos 10/09/2016, 07/24/2017, 09/06/2018   Influenza-Unspecified 06/23/2012, 06/27/2017   Meningococcal Conjugate 10/04/2012   PFIZER(Purple Top)SARS-COV-2 Vaccination 11/12/2019, 12/03/2019, 09/06/2020   Pneumococcal Conjugate-13 04/17/2016   Pneumococcal Polysaccharide-23 04/20/2017   Td 10/24/2003   Tdap 10/04/2012     Objective: Vital Signs: BP 121/63 (BP Location: Right Arm, Patient Position: Sitting, Cuff Size: Normal)   Pulse 80   Temp 98.2 F (36.8 C)   Resp 16   Ht 5' 3 (1.6 m)   Wt 206 lb 9.6 oz (93.7 kg)   BMI 36.60 kg/m    Physical Exam Eyes:     Conjunctiva/sclera: Conjunctivae normal.  Cardiovascular:     Rate and Rhythm: Normal rate and regular rhythm.  Pulmonary:     Effort: Pulmonary effort is normal.     Breath sounds: Normal breath sounds.  Musculoskeletal:     Right lower leg: No edema.     Left lower leg: No edema.  Lymphadenopathy:     Cervical: No cervical adenopathy.  Skin:    General: Skin is warm and dry.     Findings: Rash present.     Comments: Patchy erythema on skin without papules or scaling Normal appearing nailfold capillaroscopy  Neurological:     Mental Status: She is alert.  Psychiatric:        Mood and Affect: Mood normal.      Musculoskeletal Exam:  Shoulders full ROM no tenderness or swelling Elbows full ROM no tenderness or swelling Wrists full ROM no tenderness or swelling Fingers full ROM no tenderness or swelling Knees full ROM no  tenderness or swelling, postsurgical changes, some crepitus on right knee Ankles full ROM no tenderness or swelling   Investigation: No additional findings.  Imaging: VAS US  ABI WITH/WO TBI Result Date: 06/18/2024  LOWER EXTREMITY DOPPLER STUDY Patient Name:  JESSCIA IMM  Date of Exam:   06/18/2024 Medical Rec #: [id number]           Accession #:    [id number] Date of Birth: 09-May-1946           Patient Gender: F Patient Age:   61 years Exam Location:  [address] Procedure:      VAS US  ABI WITH/WO TBI Referring Phys: PENNE COLORADO --------------------------------------------------------------------------------  Indications: Claudication. High Risk Factors: Hypertension, hyperlipidemia.  Comparison Study: None. Performing Technologist: Garnette Rockers  Examination Guidelines: A complete evaluation includes at minimum, Doppler waveform signals and systolic blood pressure reading at the level of bilateral brachial, anterior tibial, and posterior tibial arteries, when vessel segments are accessible. Bilateral testing is considered an integral part of a complete examination. Photoelectric Plethysmograph (PPG) waveforms and toe systolic pressure readings are included as required and additional duplex testing as needed. Limited examinations for reoccurring indications may be performed as noted.  ABI Findings: +---------+------------------+-----+---------+--------+ Right    Rt Pressure (mmHg)IndexWaveform Comment  +---------+------------------+-----+---------+--------+ Brachial 147                                      +---------+------------------+-----+---------+--------+ PTA      149               1.01 triphasic         +---------+------------------+-----+---------+--------+ DP       122               0.83 triphasic         +---------+------------------+-----+---------+--------+ Great Toe90                0.61 Normal             +---------+------------------+-----+---------+--------+ +---------+------------------+-----+---------+-------+ Left     Lt Pressure (mmHg)IndexWaveform Comment +---------+------------------+-----+---------+-------+ Brachial 139                                     +---------+------------------+-----+---------+-------+ PTA      131               0.89 triphasic        +---------+------------------+-----+---------+-------+ DP       135               0.92 triphasic        +---------+------------------+-----+---------+-------+ Great Toe121               0.82 Normal           +---------+------------------+-----+---------+-------+ +-------+-----------+-----------+------------+------------+ ABI/TBIToday's ABIToday's TBIPrevious ABIPrevious TBI +-------+-----------+-----------+------------+------------+ Right  1.01       0.61                                +-------+-----------+-----------+------------+------------+ Left   0.92       0.82                                +-------+-----------+-----------+------------+------------+  Summary: Right: Resting right ankle-brachial index is within normal  range. Right toe pressure is >60 mmHg which suggests adequate perfusion for healing. Left: Resting left ankle-brachial index indicates mild left lower extremity arterial disease. The left toe-brachial index is normal.  *See table(s) above for measurements and observations.  Electronically signed by Penne Colorado MD on 06/18/2024 at 11:56:01 AM.    Final     Recent Labs: Lab Results  Component Value Date   WBC 10.3 02/11/2024   HGB 11.6 (L) 02/11/2024   PLT 346.0 02/11/2024   NA 138 02/11/2024   K 4.7 02/11/2024   CL 100 02/11/2024   CO2 29 02/11/2024   GLUCOSE 78 02/11/2024   BUN 15 02/11/2024   CREATININE 1.10 02/11/2024   BILITOT 0.5 02/11/2024   ALKPHOS 62 02/11/2024   AST 17 02/11/2024   ALT 10 02/11/2024   PROT 7.2 02/11/2024   ALBUMIN 3.7 02/11/2024   CALCIUM 9.2  02/11/2024   GFRAA 50 (L) 08/29/2017    Speciality Comments: No specialty comments available.  Procedures:  No procedures performed Allergies: Cefaclor, Penicillins, Sulfa  antibiotics, and Advair diskus [fluticasone -salmeterol]   Assessment / Plan:     Visit Diagnoses: Positive ANA (antinuclear antibody) - Plan: Sedimentation rate, C-reactive protein, C3 and C4, RNP Antibody, Anti-DNA antibody, double-stranded, Anti-Smith antibody Livedo reticularis Intermittent livedo reticularis possibly related to post-infectious inflammatory response. Positive ANA test not indicative of specific autoimmune disorder. If inflammatory serology high without disease specific antibodies may be a prolonged inflammatory response but usually also ultimately self limited. - Order blood tests including dsDNA, Smith, RNP antibody and inflammatory markers (sedimentation rate, C-reactive protein, complements).    Chronic knee pain post-bilateral knee replacement Chronic knee pain persists, particularly in the right knee with partial replacement. Discussed possibility of inflammatory arthritis, synovitis, or maybe functional pain with tendinopathy or instability - Consider topical Voltaren for knee pain. - Encourage low-impact exercises such as biking, water  exercises, or Tai Chi.  Osteoarthritis of hands and neck Intermittent hand pain and neck discomfort likely due to osteoarthritis. Low-dose meloxicam  considered safe despite previous concerns.  Calf pain with exertion (possible claudication) Intermittent calf pain with exertion, suggestive of claudication. Vascular claudication less likely due to good circulation and palpable pulses; possible neurogenic claudication due to herniated disc history. - Referral for PM&R for further evaluation of possible neurogenic claudication.  Obesity Obesity with weight fluctuations. Discussed Zepbound for weight management, considering arthritis and sleep apnea. Explained  benefits and risks, including muscle mass loss. - Consider starting Zepbound for weight management. - Encourage protein intake and resistance exercises to prevent muscle loss.  Splitting fingernails (onychoschizia) Splitting fingernails with longitudinal ridges likely age-related.  Recommended trying addition of biotin or collagen supplements for nail health.        Orders: Orders Placed This Encounter  Procedures   Sedimentation rate   C-reactive protein   C3 and C4   RNP Antibody   Anti-DNA antibody, double-stranded   Anti-Smith antibody   No orders of the defined types were placed in this encounter.   Follow-Up Instructions: No follow-ups on file.   Lonni LELON Ester, MD  Note - This record has been created using [employer].  Chart creation errors have been sought, but may not always  have been located. Such creation errors do not reflect on  the standard of medical care.

## 2024-07-17 LAB — C3 AND C4
C3 Complement: 145 mg/dL (ref 83–193)
C4 Complement: 27 mg/dL (ref 15–57)

## 2024-07-17 LAB — RNP ANTIBODY: Ribonucleic Protein(ENA) Antibody, IgG: <1 AI

## 2024-07-17 LAB — ANTI-DNA ANTIBODY, DOUBLE-STRANDED: ds DNA Ab: 1 [IU]/mL

## 2024-07-17 LAB — SEDIMENTATION RATE: Sed Rate: 87 mm/h — ABNORMAL HIGH (ref 0–30)

## 2024-07-17 LAB — C-REACTIVE PROTEIN: CRP: 22.6 mg/L — ABNORMAL HIGH (ref <8.0)

## 2024-07-17 LAB — ANTI-SMITH ANTIBODY: ENA SM Ab Ser-aCnc: <1 AI

## 2024-07-25 ENCOUNTER — Telehealth: Payer: Self-pay

## 2024-07-25 NOTE — Telephone Encounter (Signed)
 Patient contacted the office and inquires about her lab results. Patient states a couple of them were abnormal and she did not know what next steps were or if she needs to schedule a follow up. Please advise.

## 2024-07-30 ENCOUNTER — Ambulatory Visit: Admitting: Family Medicine

## 2024-08-06 ENCOUNTER — Encounter: Payer: Self-pay | Admitting: Internal Medicine

## 2024-08-14 ENCOUNTER — Encounter: Attending: Physical Medicine & Rehabilitation | Admitting: Physical Medicine & Rehabilitation

## 2024-08-14 ENCOUNTER — Encounter: Payer: Self-pay | Admitting: Physical Medicine & Rehabilitation

## 2024-08-14 VITALS — BP 103/69 | HR 71 | Ht 63.0 in | Wt 212.0 lb

## 2024-08-14 DIAGNOSIS — G8928 Other chronic postprocedural pain: Secondary | ICD-10-CM | POA: Diagnosis not present

## 2024-08-14 NOTE — Progress Notes (Signed)
 Subjective:    Patient ID: Kristine Olson, female    DOB: 04-19-1946, 78 y.o.   MRN: [id number]  HPI Discussed the use of AI scribe software for clinical note transcription with the patient, who gave verbal consent to proceed.  History of Present Illness Kristine Olson is a 78 year old female who presents with chronic pain in her legs and back.  She experiences chronic pain that initially began in her arms and later moved to her calves. The arm pain lasted for about three weeks and was accompanied by a 'motley look' on her skin, which has since resolved. The calf pain was severe enough to limit her ability to walk around the grocery store or at church, but it has since subsided significantly.  She describes a sensation of inflammation moving throughout her body and is uncertain about the cause. A previous rheumatologist attributed her symptoms to 'old age wear and tear arthritis.' She underwent six blood tests, two of which returned abnormal results, including a positive ANA test that was only slightly elevated, while other tests for lupus were negative.  She experiences pain in her back, particularly when walking, and attributes some of this to her weight. She also reports significant knee pain, with one knee having undergone a full replacement and the other a partial replacement. The knee pain is described as the worst of her symptoms.  She reports that she has to hem one pant leg about an inch and a half shorter than the other and has noticed that one hip appears higher than the other. She suspects scoliosis, as one hip appears higher than the other. She has a history of herniated discs, which were previously managed with physical therapy.  She has a history of low vitamin D  and occasional iron supplementation. Her hemoglobin A1c is mildly elevated.  She has a family history of severe rheumatoid arthritis in her mother.   Pain Inventory Average Pain 4 Pain Right Now 3 My  pain is burning, tingling, and aching  In the last 24 hours, has pain interfered with the following? General activity 2 Relation with others 1 Enjoyment of life 4 What TIME of day is your pain at its worst? varies Sleep (in general) Good  Pain is worse with: walking and standing Pain improves with: rest Relief from Meds: no answer  walk without assistance how many minutes can you walk? 30 or less ability to climb steps?  yes do you drive?  yes  retired  No problems in this area dizziness  Any changes since last visit?  yes  Primary care Comer Greet, MD Rheumatologist Lonni Ester, MD    Family History  Problem Relation Age of Onset   Hypertension Mother    Osteoporosis Mother    Thyroid  disease Mother    Rheum arthritis Mother    Arthritis Mother    Hypertension Father    Alcohol abuse Father    Liver disease Father    Obesity Father    Alcoholism Father    Depression Father    Melanoma Brother    Cancer Maternal Grandmother    Heart attack Maternal Grandfather        > 19   Multiple sclerosis Daughter 7   Depression Daughter    Bipolar disorder Daughter    Stroke Maternal Aunt         X 3    Diabetes Maternal Aunt    Deep vein thrombosis Paternal Aunt    Other  Paternal Aunt        Phlebitis without deep venous thrombosis   Colon cancer Neg Hx    Colon polyps Neg Hx    Esophageal cancer Neg Hx    Rectal cancer Neg Hx    Stomach cancer Neg Hx    Social History   Socioeconomic History   Marital status: Married    Spouse name: Not on file   Number of children: 3   Years of education: Not on file   Highest education level: Master's degree (e.g., MA, MS, MEng, MEd, MSW, MBA)  Occupational History   Occupation: Retired  Tobacco Use   Smoking status: Never    Passive exposure: Past   Smokeless tobacco: Never  Vaping Use   Vaping status: Never Used  Substance and Sexual Activity   Alcohol use: Yes    Alcohol/week: 0.0 standard drinks  of alcohol    Comment:  very rarely   Drug use: No   Sexual activity: Yes    Birth control/protection: Post-menopausal  Other Topics Concern   Not on file  Social History Narrative   3 biological children (Set of Identical twins)    3 Step children    Social Drivers of Corporate investment banker Strain: Low Risk  (07/01/2024)   Overall Financial Resource Strain (CARDIA)    Difficulty of Paying Living Expenses: Not hard at all  Food Insecurity: No Food Insecurity (07/01/2024)   Hunger Vital Sign    Worried About Running Out of Food in the Last Year: Never true    Ran Out of Food in the Last Year: Never true  Transportation Needs: Unknown (07/01/2024)   PRAPARE - Administrator, Civil Service (Medical): Not on file    Lack of Transportation (Non-Medical): No  Physical Activity: Inactive (07/01/2024)   Exercise Vital Sign    Days of Exercise per Week: 0 days    Minutes of Exercise per Session: 0 min  Stress: No Stress Concern Present (07/01/2024)   [employer] of Occupational Health - Occupational Stress Questionnaire    Feeling of Stress: Not at all  Social Connections: Socially Integrated (07/01/2024)   Social Connection and Isolation Panel    Frequency of Communication with Friends and Family: More than three times a week    Frequency of Social Gatherings with Friends and Family: Twice a week    Attends Religious Services: More than 4 times per year    Active Member of Clubs or Organizations: Yes    Attends Engineer, structural: More than 4 times per year    Marital Status: Married   Past Surgical History:  Procedure Laterality Date   BIOPSY BREAST     2 biopsies & 2 aspirations   CESAREAN SECTION     CLOSED REDUCTION TOE FRACTURE     COLONOSCOPY  2015   COLONOSCOPY W/ POLYPECTOMY  multiple since 2007   adenomas, diverticulosis;last 2015   ESOPHAGOGASTRODUODENOSCOPY (EGD) WITH PROPOFOL  N/A 05/29/2016   Procedure: ESOPHAGOGASTRODUODENOSCOPY (EGD) WITH  PROPOFOL ;  Surgeon: Lupita FORBES Commander, MD;  Location: [facility];  Service: Endoscopy;  Laterality: N/A;   HAMMER TOE SURGERY     JOINT REPLACEMENT     PARTIAL KNEE ARTHROPLASTY Right 05/22/2016   Procedure: RIGHT KNEE MEDIAL UNICOMPARTMENTAL ARTHROPLASTY;  Surgeon: Dempsey Moan, MD;  Location: [facility];  Service: Orthopedics;  Laterality: Right;   TONSILLECTOMY AND ADENOIDECTOMY     TOTAL KNEE ARTHROPLASTY Left 08/27/2017   Procedure: LEFT TOTAL KNEE ARTHROPLASTY;  Surgeon: Melodi Lerner, MD;  Location: [facility];  Service: Orthopedics;  Laterality: Left;   WISDOM TOOTH EXTRACTION     Past Medical History:  Diagnosis Date   Acute pyloric channel ulcer with hemorrhage on Xarelto     Anemia    history of anemia after bleeding ulcer   Arthritis    ostearthritis - right  medial knee   Back pain    Cataract    beginning cataracts   Complication of anesthesia    was given a medication while waiting to go to surgery that caused burning sensation on scalp was given another medication and the burning went away   COVID-19 2022   Depression    Edema    GERD (gastroesophageal reflux disease)    heartburn daily-TUMS helpful   Hemorrhoid    History of blood transfusion    2 units PRBC   History of stomach ulcers    Hx of sinus tachycardia    rarely any problems since metoprolol  use   Hyperlipidemia    patient unaware, has never taking medication   Hypertension    Hypothyroidism    Joint pain    Osteoporosis    osteopenia   Sleep apnea    uses c pap   SOB (shortness of breath)    Varicose vein of leg    Vitamin D  deficiency    BP 103/69 (BP Location: Left Arm, Patient Position: Sitting, Cuff Size: Large)   Pulse 71   Ht 5' 3 (1.6 m)   Wt 212 lb (96.2 kg)   SpO2 95%   BMI 37.55 kg/m   Opioid Risk Score:   Fall Risk Score:  `1  Depression screen PHQ 2/9     08/14/2024    2:07 PM 07/01/2024    4:00 PM 02/11/2024    1:44 PM 02/07/2023    9:53 AM 01/29/2023    9:49 AM 01/25/2023     2:07 PM 08/07/2022   10:06 AM  Depression screen PHQ 2/9  Decreased Interest 0 0 0 0 0 0 0  Down, Depressed, Hopeless 0 0 0 0 0 0 0  PHQ - 2 Score 0 0 0 0 0 0 0  Altered sleeping 0 0 0 0 0 0 0  Tired, decreased energy 1 0 3 0 1 0 1  Change in appetite 1 0 0 0  0 1  Feeling bad or failure about yourself  0 0 0 0 0 0 0  Trouble concentrating 0 0 0 0 0 0 0  Moving slowly or fidgety/restless 0 0 0 0 0 0 0  Suicidal thoughts 0 0 0 0 0 0 0  PHQ-9 Score 2 0 3 0 1 0 2  Difficult doing work/chores Somewhat difficult Not difficult at all Not difficult at all Not difficult at all Not difficult at all Not difficult at all Not difficult at all     Review of Systems  Gastrointestinal:  Positive for constipation.  Musculoskeletal:  Positive for back pain and myalgias.       Low back radiating aroun, bilateral knee and lower leg pain  All other systems reviewed and are negative.      Objective:   Physical Exam Vitals and nursing note reviewed.  Constitutional:      Appearance: She is obese.  Musculoskeletal:     Comments: No joint swelling noted in the wrists MCPs PIPs and DIPs. No swelling noted in the knees or ankles. Well-healed surgical incisions of bilateral knees no  evidence of knee deformity Lumbar spine has mild tenderness palpation around L4 and L5 paraspinal region.   Mild dextroconvex scoliosis there is also tenderness that is lateral to the erector spinae muscle group. Mild tenderness over the gluteus medius bilaterally. Mild tenderness over the right TFL muscle belly No pain with hip range of motion internal right external rotation no pain with knee range of motion.  Sacral thrust (prone) : Negative  FABER's: Negative Distraction (supine): Negative Thigh thrust test: Negative Left ASIS to medial malleolus 89 cm Right ASIS to medial malleolus 88 cm  Skin:    Comments: Mild libido reticularis bilateral forearms and shins. No evidence of malar rash No petechiae   Neurological:     Mental Status: She is alert and oriented to person, place, and time.     Sensory: No sensory deficit.     Gait: Gait normal.  Psychiatric:        Mood and Affect: Mood normal.        Behavior: Behavior normal.   Motor strength is 5/5 bilateral deltoid bicep tricep grip as well as hip flexor knee extensor ankle dorsiflexor plantar flexor         Assessment & Plan:  Assessment and Plan Assessment & Plan Chronic postoperative knee pain (right and left) Chronic pain in both knees post-surgery, with numbness around surgical sites. Pain limits activity and affects weight management. - Recommend physical therapy focusing on aquatic therapy. - Consider procedures for knee pain if physical therapy fails.  This would include genicular nerve blocks and RF if there is temporary relief with nerve blocks - Provide diet sheet for inflammation management.  Leg length discrepancy Left leg is one centimeter longer, likely due to left knee replacement and scoliosis, causing functional limitations and pain. - Address discrepancy through physical therapy for alignment and pain reduction.  Thoracolumbar scoliosis with associated low back pain Thoracolumbar scoliosis with right dextroscoliosis and pelvic obliquity contributes to low back pain and muscular tightness. - Recommend physical therapy focusing on aquatic therapy. - Consider lumbar spine MRI if symptoms persist post-therapy.  Generalized unexplained inflammation Elevated inflammation markers with negative specific lupus tests.  - Discuss inflammation markers and causes with Doctor Bowen. - Consider dietary modifications for inflammation management.

## 2024-08-14 NOTE — Patient Instructions (Signed)
  VISIT SUMMARY: Today, we discussed your chronic pain in your legs and back, including knee pain, leg length discrepancy, and scoliosis. We also reviewed your history of inflammation and discussed potential causes and management strategies.  YOUR PLAN: CHRONIC POSTOPERATIVE KNEE PAIN (RIGHT AND LEFT): You have chronic pain in both knees following surgery, with numbness around the surgical sites. This pain limits your activity and affects your weight management. -Start physical therapy focusing on aquatic therapy. -Consider procedures for knee pain if physical therapy does not help. -Follow the provided diet sheet for managing inflammation.  LEG LENGTH DISCREPANCY: Your left leg is one centimeter longer, likely due to your left knee replacement and scoliosis, causing functional limitations and pain. -Address the leg length discrepancy through physical therapy to improve alignment and reduce pain.  THORACOLUMBAR SCOLIOSIS WITH ASSOCIATED LOW BACK PAIN: You have thoracolumbar scoliosis with right dextroscoliosis and pelvic obliquity, which contributes to low back pain and muscular tightness. -Start physical therapy focusing on aquatic therapy. -Consider a lumbar spine MRI if symptoms persist after physical therapy.  GENERALIZED UNEXPLAINED INFLAMMATION: You have elevated inflammation markers, but specific tests for lupus were negative. -Discuss inflammation markers and potential causes with Doctor Bowen. -Consider dietary modifications to help manage inflammation.                      Contains text generated by Abridge.                                 Contains text generated by Abridge.

## 2024-08-19 ENCOUNTER — Encounter: Payer: Self-pay | Admitting: Family Medicine

## 2024-08-19 ENCOUNTER — Ambulatory Visit: Admitting: Family Medicine

## 2024-08-19 VITALS — BP 115/74 | HR 74 | Temp 98.6°F | Ht 63.0 in | Wt 208.0 lb

## 2024-08-19 DIAGNOSIS — R700 Elevated erythrocyte sedimentation rate: Secondary | ICD-10-CM

## 2024-08-19 DIAGNOSIS — M79604 Pain in right leg: Secondary | ICD-10-CM | POA: Diagnosis not present

## 2024-08-19 DIAGNOSIS — G4733 Obstructive sleep apnea (adult) (pediatric): Secondary | ICD-10-CM | POA: Diagnosis not present

## 2024-08-19 DIAGNOSIS — R7303 Prediabetes: Secondary | ICD-10-CM | POA: Diagnosis not present

## 2024-08-19 DIAGNOSIS — E66812 Obesity, class 2: Secondary | ICD-10-CM | POA: Diagnosis not present

## 2024-08-19 DIAGNOSIS — Z6836 Body mass index (BMI) 36.0-36.9, adult: Secondary | ICD-10-CM

## 2024-08-19 DIAGNOSIS — M79605 Pain in left leg: Secondary | ICD-10-CM

## 2024-08-19 MED ORDER — ZEPBOUND 2.5 MG/0.5ML ~~LOC~~ SOAJ: 2.5000 mg | SUBCUTANEOUS | 0 refills | Status: DC

## 2024-08-19 NOTE — Progress Notes (Signed)
 Office: [phone]  /  Fax: [phone]  WEIGHT SUMMARY AND BIOMETRICS  Starting Date: 10/12/22  Starting Weight: 222lb   Weight Lost Since Last Visit: 0lb   Vitals Temp: 98.6 F (37 C) BP: 115/74 Pulse Rate: 74 SpO2: 97 %   Body Composition  Body Fat %: 47.1 % Fat Mass (lbs): 98 lbs Muscle Mass (lbs): 104.6 lbs Total Body Water  (lbs): 79 lbs Visceral Fat Rating : 16    HPI  Chief Complaint: OBESITY  Kristine Olson is here to discuss her progress with her obesity treatment plan. She is on the the Category 1 Plan and states she is following her eating plan approximately 15 % of the time. She states she is exercising 0 minutes 0 times per week.  Interval History:  Since last office visit she is up 5 lb She is up 2.6 pounds of muscle mass and up 2.2 pounds of body fat since last visit She admits to doing poorly with overeating, over snacking with lots of food noise She has not yet added in regular exercise, still struggling with fatigue and leg pain She was seen by rheumatology and PM&R since her last visit She really actually has considered Zepbound after her visit last time This gives her net weight loss of 14 pounds and 1.5 years of medically supervised weight management  Pharmacotherapy: None  PHYSICAL EXAM:  Blood pressure 115/74, pulse 74, temperature 98.6 F (37 C), height 5' 3 (1.6 m), weight 208 lb (94.3 kg), SpO2 97%. Body mass index is 36.85 kg/m.  General: She is overweight, cooperative, alert, well developed, and in no acute distress. PSYCH: Has normal mood, affect and thought process.   Lungs: Normal breathing effort, no conversational dyspnea.   ASSESSMENT AND PLAN  TREATMENT PLAN FOR OBESITY:  Recommended Dietary Goals  Chiyeko is currently in the action stage of change. As such, her goal is to continue weight management plan. She has agreed to the Category 1 Plan.  Behavioral Intervention  We discussed the following Behavioral  Modification Strategies today: increasing lean protein intake to established goals, increasing fiber rich foods, increasing water  intake , work on meal planning and preparation, keeping healthy foods at home, avoiding temptations and identifying enticing environmental cues, continue to practice mindfulness when eating, and planning for success.  Additional resources provided today: NA  Recommended Physical Activity Goals  Donita has been advised to work up to 150 minutes of moderate intensity aerobic activity a week and strengthening exercises 2-3 times per week for cardiovascular health, weight loss maintenance and preservation of muscle mass.   She has agreed to Think about enjoyable ways to increase daily physical activity and overcoming barriers to exercise and Increase physical activity in their day and reduce sedentary time (increase NEAT). She is scheduled for water  PT in December and has joined a total gym She may start water  exercise classes or water  walking in the pool while she awaits aqua PT  Pharmacotherapy changes for the treatment of obesity: She agrees to starting Zepbound 2.5 mg once weekly injection Patient denies a personal or family history of pancreatitis, medullary thyroid  carcinoma or multiple endocrine neoplasia type II. Recommend reviewing pen training video online.  ASSOCIATED CONDITIONS ADDRESSED TODAY  OSA on CPAP Continue use of CPAP nightly with an nightly sleeping goal of 7 to 8 hours.  Her last AHI on polysomnogram was 38.  We discussed extensively Zepbound use last time is indication for moderate to severe OSA.  She has decided to go ahead  and start.  Reviewed zepbound.com website for pen training video and further information. -     Zepbound; Inject 2.5 mg into the skin once a week.  Dispense: 2 mL; Refill: 0  Pain in both lower extremities Reviewed notes from rheumatology and PMNR in relation to her bilateral leg pain.  She is scheduled to start aqua PT in  December.  Notably, her sed rate and CRP levels were elevated and she has not yet heard back from Dr. Jeannetta.  Obesity, Class II, BMI 35-39.9 Worsening She remains in the contemplative phase of behavior change for both diet and exercise changes  BMI 36.0-36.9,adult  Prediabetes Lab Results  Component Value Date   HGBA1C 5.7 (H) 06/24/2024  Look for improvements in prediabetes by reducing added sugar and refined carbohydrates.  At this point in time, she is having a hard time giving up sweet tea and Coca-Cola.  We discussed the importance of limiting high fat/high sugar foods while on Zepbound due to risk for GI side effects.  Elevated sed rate Noted labs obtained by rheumatology including an elevated sed rate and elevated CRP which are nonspecific inflammatory markers.  This was reviewed with patient today.     She was informed of the importance of frequent follow up visits to maximize her success with intensive lifestyle modifications for her multiple health conditions.   ATTESTASTION STATEMENTS:  Reviewed by clinician on day of visit: allergies, medications, problem list, medical history, surgical history, family history, social history, and previous encounter notes pertinent to obesity diagnosis.   I have personally spent 34 minutes total time today in preparation, patient care, nutritional counseling and education,  and documentation for this visit, including the following: review of most recent clinical lab tests, prescribing medications/ refilling medications, reviewing medical assistant documentation, review and interpretation of bioimpedence results.     Darice Haddock, D.O. DABFM, DABOM [facility] [address] [place], [place] [address] [phone]

## 2024-08-29 DIAGNOSIS — L729 Follicular cyst of the skin and subcutaneous tissue, unspecified: Secondary | ICD-10-CM | POA: Diagnosis not present

## 2024-08-29 DIAGNOSIS — D229 Melanocytic nevi, unspecified: Secondary | ICD-10-CM | POA: Diagnosis not present

## 2024-08-29 DIAGNOSIS — R231 Pallor: Secondary | ICD-10-CM | POA: Diagnosis not present

## 2024-08-29 DIAGNOSIS — L814 Other melanin hyperpigmentation: Secondary | ICD-10-CM | POA: Diagnosis not present

## 2024-08-29 DIAGNOSIS — R700 Elevated erythrocyte sedimentation rate: Secondary | ICD-10-CM | POA: Diagnosis not present

## 2024-08-29 DIAGNOSIS — R7982 Elevated C-reactive protein (CRP): Secondary | ICD-10-CM | POA: Diagnosis not present

## 2024-08-29 DIAGNOSIS — L821 Other seborrheic keratosis: Secondary | ICD-10-CM | POA: Diagnosis not present

## 2024-09-02 DIAGNOSIS — H35363 Drusen (degenerative) of macula, bilateral: Secondary | ICD-10-CM | POA: Diagnosis not present

## 2024-09-02 DIAGNOSIS — H524 Presbyopia: Secondary | ICD-10-CM | POA: Diagnosis not present

## 2024-09-02 DIAGNOSIS — H52221 Regular astigmatism, right eye: Secondary | ICD-10-CM | POA: Diagnosis not present

## 2024-09-02 DIAGNOSIS — H04123 Dry eye syndrome of bilateral lacrimal glands: Secondary | ICD-10-CM | POA: Diagnosis not present

## 2024-09-02 DIAGNOSIS — H5212 Myopia, left eye: Secondary | ICD-10-CM | POA: Diagnosis not present

## 2024-09-02 DIAGNOSIS — H1132 Conjunctival hemorrhage, left eye: Secondary | ICD-10-CM | POA: Diagnosis not present

## 2024-09-02 DIAGNOSIS — Z961 Presence of intraocular lens: Secondary | ICD-10-CM | POA: Diagnosis not present

## 2024-09-02 DIAGNOSIS — H5201 Hypermetropia, right eye: Secondary | ICD-10-CM | POA: Diagnosis not present

## 2024-09-08 DIAGNOSIS — F411 Generalized anxiety disorder: Secondary | ICD-10-CM | POA: Diagnosis not present

## 2024-09-15 ENCOUNTER — Ambulatory Visit: Admitting: Family Medicine

## 2024-09-16 ENCOUNTER — Encounter: Admitting: Physical Medicine & Rehabilitation

## 2024-10-06 ENCOUNTER — Encounter: Payer: Self-pay | Admitting: Family Medicine

## 2024-10-06 ENCOUNTER — Ambulatory Visit: Admitting: Family Medicine

## 2024-10-06 VITALS — BP 125/75 | HR 79 | Temp 98.0°F | Ht 63.0 in | Wt 206.0 lb

## 2024-10-06 DIAGNOSIS — E66812 Obesity, class 2: Secondary | ICD-10-CM | POA: Diagnosis not present

## 2024-10-06 DIAGNOSIS — G4733 Obstructive sleep apnea (adult) (pediatric): Secondary | ICD-10-CM | POA: Diagnosis not present

## 2024-10-06 DIAGNOSIS — Z6836 Body mass index (BMI) 36.0-36.9, adult: Secondary | ICD-10-CM

## 2024-10-06 DIAGNOSIS — R7303 Prediabetes: Secondary | ICD-10-CM

## 2024-10-06 DIAGNOSIS — M130 Polyarthritis, unspecified: Secondary | ICD-10-CM

## 2024-10-06 MED ORDER — ZEPBOUND 5 MG/0.5ML ~~LOC~~ SOAJ: 5.0000 mg | SUBCUTANEOUS | 0 refills | Status: DC

## 2024-10-06 NOTE — Progress Notes (Signed)
 Office: [phone]  /  Fax: [phone]  WEIGHT SUMMARY AND BIOMETRICS  Starting Date: 10/12/22  Starting Weight: 222lb   Weight Lost Since Last Visit: 2lb   Vitals Temp: 98 F (36.7 C) BP: 125/75 Pulse Rate: 79 SpO2: 98 %   Body Composition  Body Fat %: 46.9 % Fat Mass (lbs): 97 lbs Muscle Mass (lbs): 104.2 lbs Total Body Water  (lbs): 77.4 lbs Visceral Fat Rating : 16    HPI  Chief Complaint: OBESITY  [name] is here to discuss her progress with her obesity treatment plan. She is on the the Category 1 Plan and states she is following her eating plan approximately 80 % of the time. She states she is exercising 0 minutes 0 times per week.  Interval History:  Since last office visit she is down 2 lb She did well with new start of Zepbound  2.5 mg, has completed 2 injections She is mindful of food choices and volumes She is seeing a little improvement in food volumes She did cut back on intake of soda She will be starting water  therapy at [facility] 1 x a week with Dr Carilyn She has not started exercise on her own, limited by arthritis pain in lower back and both knees (s/p replacement) She has a net weight loss of 16 lb in 2 years This is a 7.2% TBW loss  Pharmacotherapy: Zepbound  2.5 mg weekly for OSA indication  PHYSICAL EXAM:  Blood pressure 125/75, pulse 79, temperature 98 F (36.7 C), height 5' 3 (1.6 m), weight 206 lb (93.4 kg), SpO2 98%. Body mass index is 36.49 kg/m.  General: She is overweight, cooperative, alert, well developed, and in no acute distress. PSYCH: Has normal mood, affect and thought process.   Lungs: Normal breathing effort, no conversational dyspnea.  ASSESSMENT AND PLAN  TREATMENT PLAN FOR OBESITY:  Recommended Dietary Goals  Leni is currently in the action stage of change. As such, her goal is to continue weight management plan. She has agreed to the Category 1 Plan.  Behavioral Intervention  We discussed the  following Behavioral Modification Strategies today: increasing lean protein intake to established goals, increasing fiber rich foods, increasing water  intake , work on meal planning and preparation, keeping healthy foods at home, identifying sources and decreasing liquid calories, work on managing stress, creating time for self-care and relaxation, avoiding temptations and identifying enticing environmental cues, continue to practice mindfulness when eating, and planning for success.  Additional resources provided today: NA  Recommended Physical Activity Goals  Arika has been advised to work up to 150 minutes of moderate intensity aerobic activity a week and strengthening exercises 2-3 times per week for cardiovascular health, weight loss maintenance and preservation of muscle mass.   She has agreed to Think about enjoyable ways to increase daily physical activity and overcoming barriers to exercise and Increase physical activity in their day and reduce sedentary time (increase NEAT). Begin water  therapy 1 x a week Recommend water  exercise on her own 1-2 days/ wk at [facility] (water  walking, water  classes)  Pharmacotherapy changes for the treatment of obesity: increase next Zepbound  RX to 5 mg weekly  ASSOCIATED CONDITIONS ADDRESSED TODAY  OSA on CPAP Wearing CPAP nightly and has done well with new start of Zebpound at 2.5 mg x 2 weeks without adverse SE.  Will increase next dose to 5 mg weekly.  Aim for 7-8 hrs of sleep at night and continue to work on weight reduction with a BMI goal of 30. -  Zepbound ; Inject 5 mg into the skin once a week.  Dispense: 2 mL; Refill: 0  Obesity, Class II, BMI 35-39.9 She has a 7.2% TBW loss in 2 years of medically supervised weight management Her motivation to exercise has been low, limited by chronic arthritis pain and lack of support to eat healthy at home.  Long term plan given her age and co morbidities is more of a healthy mediterranean diet.   Will transition to this in the next 2 mos. Consider ref to RD  BMI 36.0-36.9,adult  Prediabetes With associated IR.  Working on reducing intake of SSBs and sugar intake in general.  She is doing well with the addition of Zepbound  and plans to start water  exercise Lab Results  Component Value Date   HGBA1C 5.7 (H) 06/24/2024    Polyarthritis Keep upcoming water  PT as ordered by Dr Carilyn with follow up scheduled for 1/27 Recommend water  walking/ water  aerobics for an additional 1-2 x a week Motivation to start has been low     She was informed of the importance of frequent follow up visits to maximize her success with intensive lifestyle modifications for her multiple health conditions.   ATTESTASTION STATEMENTS:  Reviewed by clinician on day of visit: allergies, medications, problem list, medical history, surgical history, family history, social history, and previous encounter notes pertinent to obesity diagnosis.   I have personally spent 30 minutes total time today in preparation, patient care, nutritional counseling and education,  and documentation for this visit, including the following: review of most recent clinical lab tests, prescribing medications/ refilling medications, reviewing medical assistant documentation, review and interpretation of bioimpedence results.     Darice Haddock, D.O. DABFM, DABOM [facility] [address] [place], [place] [address] [phone]

## 2024-10-19 NOTE — Therapy (Signed)
 " OUTPATIENT PHYSICAL THERAPY THORACOLUMBAR EVALUATION   Patient Name: Kristine Olson MRN: [id number] DOB:[birth year], 78 y.o., female Today's Date: 10/20/2024  END OF SESSION:  PT End of Session - 10/20/24 1139     Visit Number 1    Date for Recertification  12/19/24    Authorization Type [employer]    Progress Note Due on Visit 10    PT Start Time 1016    PT Stop Time 1055    PT Time Calculation (min) 39 min    Activity Tolerance Patient tolerated treatment well    Behavior During Therapy WFL for tasks assessed/performed          Past Medical History:  Diagnosis Date   Acute pyloric channel ulcer with hemorrhage on Xarelto     Anemia    history of anemia after bleeding ulcer   Arthritis    ostearthritis - right  medial knee   Back pain    Cataract    beginning cataracts   Complication of anesthesia    was given a medication while waiting to go to surgery that caused burning sensation on scalp was given another medication and the burning went away   COVID-19 2022   Depression    Edema    GERD (gastroesophageal reflux disease)    heartburn daily-TUMS helpful   Hemorrhoid    History of blood transfusion    2 units PRBC   History of stomach ulcers    Hx of sinus tachycardia    rarely any problems since metoprolol  use   Hyperlipidemia    patient unaware, has never taking medication   Hypertension    Hypothyroidism    Joint pain    Osteoporosis    osteopenia   Sleep apnea    uses c pap   SOB (shortness of breath)    Varicose vein of leg    Vitamin D  deficiency    Past Surgical History:  Procedure Laterality Date   BIOPSY BREAST     2 biopsies & 2 aspirations   CESAREAN SECTION     CLOSED REDUCTION TOE FRACTURE     COLONOSCOPY  2015   COLONOSCOPY W/ POLYPECTOMY  multiple since 2007   adenomas, diverticulosis;last 2015   ESOPHAGOGASTRODUODENOSCOPY (EGD) WITH PROPOFOL  N/A 05/29/2016   Procedure: ESOPHAGOGASTRODUODENOSCOPY (EGD) WITH PROPOFOL ;   Surgeon: Lupita FORBES Commander, MD;  Location: [facility];  Service: Endoscopy;  Laterality: N/A;   HAMMER TOE SURGERY     JOINT REPLACEMENT     PARTIAL KNEE ARTHROPLASTY Right 05/22/2016   Procedure: RIGHT KNEE MEDIAL UNICOMPARTMENTAL ARTHROPLASTY;  Surgeon: Dempsey Moan, MD;  Location: [facility];  Service: Orthopedics;  Laterality: Right;   TONSILLECTOMY AND ADENOIDECTOMY     TOTAL KNEE ARTHROPLASTY Left 08/27/2017   Procedure: LEFT TOTAL KNEE ARTHROPLASTY;  Surgeon: Moan Dempsey, MD;  Location: [facility];  Service: Orthopedics;  Laterality: Left;   WISDOM TOOTH EXTRACTION     Patient Active Problem List   Diagnosis Date Noted   Positive ANA (antinuclear antibody) 07/16/2024   Bilateral hand pain 03/26/2024   Morbid obesity (HCC) with starting BMI 39 07/03/2023   B12 deficiency 10/26/2022   Insulin  resistance 10/26/2022   Pre-diabetes 10/26/2022   Other fatigue 10/12/2022   SOBOE (shortness of breath on exertion) 10/12/2022   Depression screen 10/12/2022   Essential hypertension 09/11/2022   OSA on CPAP 09/11/2022   Anemia, iron deficiency 05/29/2016   OA (osteoarthritis) of knee 05/22/2016   Physical exam 04/17/2016   Hemorrhoids 12/03/2015  Elevated serum creatinine 11/27/2014   Positional vertigo 10/18/2012   Fasting hyperglycemia 08/23/2012   Vitamin D  deficiency 08/23/2012   Osteopenia 07/06/2011   Menopausal and postmenopausal disorder 03/01/2010   GERD (gastroesophageal reflux disease) 03/05/2009   Hypothyroidism 11/20/2007   IBS 11/20/2007   History of cardiovascular disorder 11/20/2007   History of colonic polyps 11/20/2007    PCP: Comer Greet MD  REFERRING PROVIDER: Carilyn Prentice BRAVO, MD   REFERRING DIAG: G89.28 (ICD-10-CM) - Chronic post-operative pain   Rationale for Evaluation and Treatment: Rehabilitation  THERAPY DIAG:  Other low back pain  Abnormal posture  Bilateral chronic knee pain  Unsteadiness on feet  ONSET DATE: 5 yrs  SUBJECTIVE:                                                                                                                                                                                            SUBJECTIVE STATEMENT: I can't do a full round through grocery store without really needing to stop. Knee and back pain but knee pain worse.  Back pain she attributes to her weight. Pain in left buttock occasional radiating pain in rle (very infrequent). She reports a leg lenth difference as she has noted with the hem she needs on her pants. I have a membership here at [facility] but haven't come in a year. Don't want to put my head under.  PERTINENT HISTORY:  L TKR 2018 R partial 2017  PAIN:  Are you having pain? Yes: NPRS scale: 0/10 current; 3/10 Knees; Back  0/10 current; 6/10 worst Pain location: LB mostly left hip, bilat knees Pain description: ache Aggravating factors: walking, grocery shopping Relieving factors: sitting; occasionally Tylenol ; rest  PRECAUTIONS: None  RED FLAGS: None   WEIGHT BEARING RESTRICTIONS: No  FALLS:  Has patient fallen in last 6 months? No   OCCUPATION: retired  PLOF: Independent  PATIENT GOALS: walk through grocery store; travel  NEXT MD VISIT: as needed  OBJECTIVE:  Note: Objective measures were completed at Evaluation unless otherwise noted.  DIAGNOSTIC FINDINGS:  None recent  PATIENT SURVEYS:  LEFs: 39/80 ODI: 15/50=30%  COGNITION: Overall cognitive status: Within functional limits for tasks assessed     SENSATION: WFL  MUSCLE LENGTH: Hamstrings: WFL tested in sitting   POSTURE:  -Thoracolumbar scoliosis with right dextroscoliosis and pelvic obliquity  -Left leg is one centimeter longer, likely due to left knee replacement and scoliosis    PALPATION: Mild TTP lumbar paraspinals  LUMBAR ROM:   wfl  LOWER EXTREMITY ROM:     WFL  LOWER EXTREMITY MMT:    MMT Right eval Left eval  Hip  flexion 3 3+  Hip extension    Hip abduction 5 5   Hip adduction 5 5  Hip internal rotation    Hip external rotation    Knee flexion 5 5  Knee extension 4(HS cramping) 5  Ankle dorsiflexion 5 5  Ankle plantarflexion 5 5  Ankle inversion    Ankle eversion     (Blank rows = not tested)  LUMBAR SPECIAL TESTS:  FABER's: Negative Distraction (supine): Negative Thigh thrust test: Negative  FUNCTIONAL TESTS:  5 times sit to stand: 12.35 Timed up and go (TUG): 10.14 4 stage balance: Passed 1&2. Tandem assistance needed to gain position hold x 5s                             SLS: unable  GAIT: Distance walked: 500 ft Assistive device utilized: None Level of assistance: Complete Independence Comments: Normal. Initial gait pt reports is antalgic in am or after sitting for long periods of time  TREATMENT Eval Self care:Posture and body mechanic instruction; use of AD; Safety with traveling                                                                                                                                PATIENT EDUCATION:  Education details: Discussed eval findings, rehab rationale, aquatic program progression/POC and pools in area. Patient is in agreement  Person educated: Patient Education method: Explanation Education comprehension: verbalized understanding  HOME EXERCISE PROGRAM: Aquatics TBA  ASSESSMENT:  CLINICAL IMPRESSION: Patient is a 78 y.o. f who was seen today for physical therapy evaluation and treatment for post operative bilateral knee pain of which have both had surgical intervention in past 10 years. She also complains of LBP which radiates into her left hip. There are no recent xrays in chart but has been dx with a mild thoracolumbar right dextroscoliosis and pelvic obliquity in 2023 which appears to have progressed causing increased pain sensitivity and reduction in toleration to activity. Her objective testing as completed today are falling [facility] other than some minor balance difficulties. She reports  decreased ability to walk through grocery stores and airports. She has become mostly sedentary as time has progressed as she has no pain in sitting and reports embarrassment for needing AD's or extra assistance. Pt will benefit from skilled PT intervention in both aquatics and land to improve core strength, improve posture, balance, manage pain and return pt to a routine exercises regimen.  Pt scheduled to be seen on land next session. Please assess need/benefit for lift in shoe and initiate HEP addressing pelvic obliquity and back strengthening/stretching.  OBJECTIVE IMPAIRMENTS: decreased activity tolerance, decreased balance, decreased knowledge of use of DME, difficulty walking, postural dysfunction, obesity, and pain.   ACTIVITY LIMITATIONS: carrying, lifting, squatting, and locomotion level  PARTICIPATION LIMITATIONS: shopping, community activity, and yard work  PERSONAL FACTORS: Fitness and Time since onset of injury/illness/exacerbation are  also affecting patient's functional outcome.   REHAB POTENTIAL: Good  CLINICAL DECISION MAKING: Stable/uncomplicated  EVALUATION COMPLEXITY: Moderate   GOALS: Goals reviewed with patient? Yes  SHORT TERM GOALS: Target date: 11/21/24  Pt will tolerate full aquatic sessions consistently without increase in pain and with improving function to demonstrate good toleration and effectiveness of intervention.  Baseline: Goal status: INITIAL  2.  Pt to trial shoe lift as deemed approp by land based therapist for reduction in Left hip pain Baseline:  Goal status: INITIAL  3.  Pt will report returning to [facility] and/or return to consistent exercise regimen Baseline: stopped x 1 yr Goal status: INITIAL    LONG TERM GOALS: Target date: 12/19/24  Pt to improve on LEFS by at least 9 point to demonstrate statistically significant Improvement in function. Baseline: 39/80 Goal status: INITIAL  2.  Pt to improve on ODI by  13% to demonstrate  statistically significant Improvement in function. (MCID 13-15%) Baseline:15/50=30% Goal status: INITIAL  3.  Pt will report completing full grocery shop without limitation to pain. Baseline: unable Goal status: INITIAL  4.  Pt will report no hesitation with traveling with or without modifications for toleration Baseline:  Goal status: INITIAL  5.  Pt will perform tandem and SL stance x 10s demonstrating improved balance ability. Baseline:  Goal status: INITIAL  6.  Pt will be indep with final HEP's (land and aquatic as appropriate) for continued management of condition Baseline:  Goal status: INITIAL  PLAN:  PT FREQUENCY: 1-2x/week  PT DURATION: 8 weeks  PLANNED INTERVENTIONS: 97164- PT Re-evaluation, 97750- Physical Performance Testing, 97110-Therapeutic exercises, 97530- Therapeutic activity, 97112- Neuromuscular re-education, 97535- Self Care, [id number]- Manual therapy, [id number]- Gait training, [phone]- Orthotic Initial, [phone]- Aquatic Therapy, [phone]- Electrical stimulation (unattended), [phone]- Electrical stimulation (manual), [id number]- Ionotophoresis 4mg /ml Dexamethasone , 79439 (1-2 muscles), [id number] (3+ muscles)- Dry Needling, Patient/Family education, Balance training, Stair training, Taping, Joint mobilization, DME instructions, Cryotherapy, and Moist heat.  PLAN FOR NEXT SESSION: Land based: assess for shoe lift; Hep for core stretching and strengthening Aquatics: postural stretching and strengthening, toleration to activity, pain management   Ronal Foots) Kayonna Lawniczak MPT 10/20/2024 2:28 PM [facility] [address] [place], [place], [address] Phone: [phone]   Fax:  [phone]   Referring diagnosis? G89.28 (ICD-10-CM) - Chronic post-operative pain  Treatment diagnosis? (if different than referring diagnosis) other LBP; knee pain; muscle weakness What was this (referring dx) caused by? []  Surgery []  Fall [x]  Ongoing  issue []  Arthritis []  Other: ____________  Laterality: []  Rt []  Lt [x]  Both  Check all possible CPT codes:  *CHOOSE 10 OR LESS*    See Planned Interventions listed in the Plan section of the Evaluation.    "

## 2024-10-20 ENCOUNTER — Ambulatory Visit (HOSPITAL_BASED_OUTPATIENT_CLINIC_OR_DEPARTMENT_OTHER): Admitting: Physical Therapy

## 2024-10-20 ENCOUNTER — Encounter (HOSPITAL_BASED_OUTPATIENT_CLINIC_OR_DEPARTMENT_OTHER): Payer: Self-pay | Admitting: Physical Therapy

## 2024-10-20 ENCOUNTER — Other Ambulatory Visit: Payer: Self-pay

## 2024-10-20 DIAGNOSIS — G8928 Other chronic postprocedural pain: Secondary | ICD-10-CM | POA: Diagnosis not present

## 2024-10-20 DIAGNOSIS — M25561 Pain in right knee: Secondary | ICD-10-CM | POA: Diagnosis present

## 2024-10-20 DIAGNOSIS — M5459 Other low back pain: Secondary | ICD-10-CM | POA: Insufficient documentation

## 2024-10-20 DIAGNOSIS — M25562 Pain in left knee: Secondary | ICD-10-CM | POA: Diagnosis present

## 2024-10-20 DIAGNOSIS — R293 Abnormal posture: Secondary | ICD-10-CM | POA: Diagnosis present

## 2024-10-20 DIAGNOSIS — R2681 Unsteadiness on feet: Secondary | ICD-10-CM | POA: Diagnosis present

## 2024-10-20 DIAGNOSIS — G8929 Other chronic pain: Secondary | ICD-10-CM | POA: Diagnosis present

## 2024-10-25 ENCOUNTER — Ambulatory Visit (HOSPITAL_BASED_OUTPATIENT_CLINIC_OR_DEPARTMENT_OTHER): Payer: Self-pay | Attending: Physical Medicine & Rehabilitation | Admitting: Physical Therapy

## 2024-10-25 ENCOUNTER — Encounter (HOSPITAL_BASED_OUTPATIENT_CLINIC_OR_DEPARTMENT_OTHER): Payer: Self-pay | Admitting: Physical Therapy

## 2024-10-25 DIAGNOSIS — M5459 Other low back pain: Secondary | ICD-10-CM | POA: Insufficient documentation

## 2024-10-25 DIAGNOSIS — R2681 Unsteadiness on feet: Secondary | ICD-10-CM | POA: Insufficient documentation

## 2024-10-25 DIAGNOSIS — R293 Abnormal posture: Secondary | ICD-10-CM | POA: Diagnosis present

## 2024-10-25 DIAGNOSIS — G8929 Other chronic pain: Secondary | ICD-10-CM | POA: Diagnosis present

## 2024-10-25 DIAGNOSIS — M25561 Pain in right knee: Secondary | ICD-10-CM | POA: Insufficient documentation

## 2024-10-25 DIAGNOSIS — M25562 Pain in left knee: Secondary | ICD-10-CM | POA: Diagnosis present

## 2024-10-25 NOTE — Therapy (Signed)
 " OUTPATIENT PHYSICAL THERAPY THORACOLUMBAR EVALUATION   Patient Name: Kristine Olson MRN: [id number] DOB:12-02-1945, 79 y.o., female Today's Date: 10/25/2024  END OF SESSION:  PT End of Session - 10/25/24 0919     Visit Number 2    Date for Recertification  12/19/24    Authorization Type [employer]    Progress Note Due on Visit 10    PT Start Time 0918    PT Stop Time 0956    PT Time Calculation (min) 38 min    Activity Tolerance Patient tolerated treatment well    Behavior During Therapy [facility] for tasks assessed/performed           Past Medical History:  Diagnosis Date   Acute pyloric channel ulcer with hemorrhage on Xarelto     Anemia    history of anemia after bleeding ulcer   Arthritis    ostearthritis - right  medial knee   Back pain    Cataract    beginning cataracts   Complication of anesthesia    was given a medication while waiting to go to surgery that caused burning sensation on scalp was given another medication and the burning went away   COVID-19 2022   Depression    Edema    GERD (gastroesophageal reflux disease)    heartburn daily-TUMS helpful   Hemorrhoid    History of blood transfusion    2 units PRBC   History of stomach ulcers    Hx of sinus tachycardia    rarely any problems since metoprolol  use   Hyperlipidemia    patient unaware, has never taking medication   Hypertension    Hypothyroidism    Joint pain    Osteoporosis    osteopenia   Sleep apnea    uses c pap   SOB (shortness of breath)    Varicose vein of leg    Vitamin D  deficiency    Past Surgical History:  Procedure Laterality Date   BIOPSY BREAST     2 biopsies & 2 aspirations   CESAREAN SECTION     CLOSED REDUCTION TOE FRACTURE     COLONOSCOPY  2015   COLONOSCOPY W/ POLYPECTOMY  multiple since 2007   adenomas, diverticulosis;last 2015   ESOPHAGOGASTRODUODENOSCOPY (EGD) WITH PROPOFOL  N/A 05/29/2016   Procedure: ESOPHAGOGASTRODUODENOSCOPY (EGD) WITH PROPOFOL ;   Surgeon: Lupita FORBES Commander, MD;  Location: [facility];  Service: Endoscopy;  Laterality: N/A;   HAMMER TOE SURGERY     JOINT REPLACEMENT     PARTIAL KNEE ARTHROPLASTY Right 05/22/2016   Procedure: RIGHT KNEE MEDIAL UNICOMPARTMENTAL ARTHROPLASTY;  Surgeon: Dempsey Moan, MD;  Location: [facility];  Service: Orthopedics;  Laterality: Right;   TONSILLECTOMY AND ADENOIDECTOMY     TOTAL KNEE ARTHROPLASTY Left 08/27/2017   Procedure: LEFT TOTAL KNEE ARTHROPLASTY;  Surgeon: Moan Dempsey, MD;  Location: [facility];  Service: Orthopedics;  Laterality: Left;   WISDOM TOOTH EXTRACTION     Patient Active Problem List   Diagnosis Date Noted   Positive ANA (antinuclear antibody) 07/16/2024   Bilateral hand pain 03/26/2024   Morbid obesity (HCC) with starting BMI 39 07/03/2023   B12 deficiency 10/26/2022   Insulin  resistance 10/26/2022   Pre-diabetes 10/26/2022   Other fatigue 10/12/2022   SOBOE (shortness of breath on exertion) 10/12/2022   Depression screen 10/12/2022   Essential hypertension 09/11/2022   OSA on CPAP 09/11/2022   Anemia, iron deficiency 05/29/2016   OA (osteoarthritis) of knee 05/22/2016   Physical exam 04/17/2016   Hemorrhoids  12/03/2015   Elevated serum creatinine 11/27/2014   Positional vertigo 10/18/2012   Fasting hyperglycemia 08/23/2012   Vitamin D  deficiency 08/23/2012   Osteopenia 07/06/2011   Menopausal and postmenopausal disorder 03/01/2010   GERD (gastroesophageal reflux disease) 03/05/2009   Hypothyroidism 11/20/2007   IBS 11/20/2007   History of cardiovascular disorder 11/20/2007   History of colonic polyps 11/20/2007    PCP: Comer Greet MD  REFERRING PROVIDER: Carilyn Prentice BRAVO, MD   REFERRING DIAG: G89.28 (ICD-10-CM) - Chronic post-operative pain   Rationale for Evaluation and Treatment: Rehabilitation  THERAPY DIAG:  Other low back pain  Abnormal posture  Bilateral chronic knee pain  Unsteadiness on feet  ONSET DATE: 5 yrs  SUBJECTIVE:                                                                                                                                                                                            SUBJECTIVE STATEMENT: Denies back pain right now.   PERTINENT HISTORY:  L TKR 2018 R partial 2017  PAIN:  Are you having pain? Yes: NPRS scale: 0/10 current; 3/10 Knees; Back  0/10 current; 6/10 worst Pain location: LB mostly left hip, bilat knees Pain description: ache Aggravating factors: walking, grocery shopping Relieving factors: sitting; occasionally Tylenol ; rest  PRECAUTIONS: None  RED FLAGS: None   WEIGHT BEARING RESTRICTIONS: No  FALLS:  Has patient fallen in last 6 months? No   OCCUPATION: retired  PLOF: Independent  PATIENT GOALS: walk through grocery store; travel  NEXT MD VISIT: as needed  OBJECTIVE:  Note: Objective measures were completed at Evaluation unless otherwise noted.  DIAGNOSTIC FINDINGS:  None recent  PATIENT SURVEYS:  LEFs: 39/80 ODI: 15/50=30%  COGNITION: Overall cognitive status: Within functional limits for tasks assessed     SENSATION: WFL  MUSCLE LENGTH: Hamstrings: WFL tested in sitting   POSTURE:  -Thoracolumbar scoliosis with right dextroscoliosis and pelvic obliquity  -Left leg is one centimeter longer, likely due to left knee replacement and scoliosis    PALPATION: Mild TTP lumbar paraspinals  LUMBAR ROM:   wfl  LOWER EXTREMITY ROM:     WFL  LOWER EXTREMITY MMT:    MMT Right eval Left eval  Hip flexion 3 3+  Hip extension    Hip abduction 5 5  Hip adduction 5 5  Hip internal rotation    Hip external rotation    Knee flexion 5 5  Knee extension 4(HS cramping) 5  Ankle dorsiflexion 5 5  Ankle plantarflexion 5 5  Ankle inversion    Ankle eversion     (Blank rows = not tested)  LUMBAR  SPECIAL TESTS:  FABER's: Negative Distraction (supine): Negative Thigh thrust test: Negative  FUNCTIONAL TESTS:  5 times sit to  stand: 12.35 Timed up and go (TUG): 10.14 4 stage balance: Passed 1&2. Tandem assistance needed to gain position hold x 5s                             SLS: unable  GAIT: Distance walked: 500 ft Assistive device utilized: None Level of assistance: Complete Independence Comments: Normal. Initial gait pt reports is antalgic in am or after sitting for long periods of time  TREATMENT   10/25/24 3-layer heel lift to Rt shoe Discussed shoe wear- possible fusion of 2nd & 3rd toes? Will monitor for pain See HEP for exercises  Eval:  Self care:Posture and body mechanic instruction; use of AD; Safety with traveling                                                                                                                                PATIENT EDUCATION:  Education details: Discussed eval findings, rehab rationale, aquatic program progression/POC and pools in area. Patient is in agreement  Person educated: Patient Education method: Explanation Education comprehension: verbalized understanding  HOME EXERCISE PROGRAM: Aquatics TBA 3-layer heel lift in Rt shoe (10/25/24)  Access Code: [email or web address] URL: [email or web address] Date: 10/25/2024 Prepared by: Harlene Cordon  Exercises - Supine Hip Adduction Isometric with Ball  - 1 x daily - 7 x weekly - 1 sets - 10 reps - 3 breaths hold - Supine Bridge with Mini Swiss Ball Between Knees  - 1 x daily - 7 x weekly - 3 sets - 10 reps - Sidelying Hip Abduction  - 1 x daily - 7 x weekly - 3 sets - 10 reps - Sidelying Hip Circles  - 1 x daily - 7 x weekly - 3 sets - 10 reps - supine SLR 3*10  ASSESSMENT:  CLINICAL IMPRESSION:  Notable LLD not fully corrected by insert of heel lift. Will trial this height for a while with stabilization exercises prior to looking into full shoe changes. Will benefit from gross lumbopelvic strength and was encouraged to wear heel lift unless she is laying down for bed but to remove if concordant  pain increases.   EVAL: Patient is a 79 y.o. f who was seen today for physical therapy evaluation and treatment for post operative bilateral knee pain of which have both had surgical intervention in past 10 years. She also complains of LBP which radiates into her left hip. There are no recent xrays in chart but has been dx with a mild thoracolumbar right dextroscoliosis and pelvic obliquity in 2023 which appears to have progressed causing increased pain sensitivity and reduction in toleration to activity. Her objective testing as completed today are falling [facility] other than some minor balance difficulties. She reports decreased ability to walk through grocery stores and airports. She  has become mostly sedentary as time has progressed as she has no pain in sitting and reports embarrassment for needing AD's or extra assistance. Pt will benefit from skilled PT intervention in both aquatics and land to improve core strength, improve posture, balance, manage pain and return pt to a routine exercises regimen.  Pt scheduled to be seen on land next session. Please assess need/benefit for lift in shoe and initiate HEP addressing pelvic obliquity and back strengthening/stretching.  OBJECTIVE IMPAIRMENTS: decreased activity tolerance, decreased balance, decreased knowledge of use of DME, difficulty walking, postural dysfunction, obesity, and pain.   ACTIVITY LIMITATIONS: carrying, lifting, squatting, and locomotion level  PARTICIPATION LIMITATIONS: shopping, community activity, and yard work  PERSONAL FACTORS: Fitness and Time since onset of injury/illness/exacerbation are also affecting patient's functional outcome.   REHAB POTENTIAL: Good  CLINICAL DECISION MAKING: Stable/uncomplicated  EVALUATION COMPLEXITY: Moderate   GOALS: Goals reviewed with patient? Yes  SHORT TERM GOALS: Target date: 11/21/24  Pt will tolerate full aquatic sessions consistently without increase in pain and with improving  function to demonstrate good toleration and effectiveness of intervention.  Baseline: Goal status: INITIAL  2.  Pt to trial shoe lift as deemed approp by land based therapist for reduction in Left hip pain Baseline:  Goal status: INITIAL  3.  Pt will report returning to [facility] and/or return to consistent exercise regimen Baseline: stopped x 1 yr Goal status: INITIAL    LONG TERM GOALS: Target date: 12/19/24  Pt to improve on LEFS by at least 9 point to demonstrate statistically significant Improvement in function. Baseline: 39/80 Goal status: INITIAL  2.  Pt to improve on ODI by  13% to demonstrate statistically significant Improvement in function. (MCID 13-15%) Baseline:15/50=30% Goal status: INITIAL  3.  Pt will report completing full grocery shop without limitation to pain. Baseline: unable Goal status: INITIAL  4.  Pt will report no hesitation with traveling with or without modifications for toleration Baseline:  Goal status: INITIAL  5.  Pt will perform tandem and SL stance x 10s demonstrating improved balance ability. Baseline:  Goal status: INITIAL  6.  Pt will be indep with final HEP's (land and aquatic as appropriate) for continued management of condition Baseline:  Goal status: INITIAL  PLAN:  PT FREQUENCY: 1-2x/week  PT DURATION: 8 weeks  PLANNED INTERVENTIONS: [id number]- PT Re-evaluation, 97750- Physical Performance Testing, 97110-Therapeutic exercises, 97530- Therapeutic activity, [id number]- Neuromuscular re-education, 97535- Self Care, [id number]- Manual therapy, [id number]- Gait training, [phone]- Orthotic Initial, [phone]- Aquatic Therapy, [phone]- Electrical stimulation (unattended), [phone]- Electrical stimulation (manual), [id number]- Ionotophoresis 4mg /ml Dexamethasone , 79439 (1-2 muscles), [id number] (3+ muscles)- Dry Needling, Patient/Family education, Balance training, Stair training, Taping, Joint mobilization, DME instructions, Cryotherapy, and Moist heat.  PLAN FOR NEXT  SESSION: Land based: assess for shoe lift; Hep for core stretching and strengthening Aquatics: postural stretching and strengthening, toleration to activity, pain management- try to work with Rt foot uphill to make up for lack of lift while in pool   [place] C. Merci Walthers PT, DPT 10/25/2024 9:57 AM  [facility] [address] [place], [place], [address] Phone: [phone]   Fax:  [phone]   Referring diagnosis? G89.28 (ICD-10-CM) - Chronic post-operative pain  Treatment diagnosis? (if different than referring diagnosis) other LBP; knee pain; muscle weakness What was this (referring dx) caused by? []  Surgery []  Fall [x]  Ongoing issue []  Arthritis []  Other: ____________  Laterality: []  Rt []  Lt [x]  Both  Check all possible CPT codes:  *CHOOSE 10  OR LESS*    See Planned Interventions listed in the Plan section of the Evaluation.    "

## 2024-10-27 ENCOUNTER — Encounter (HOSPITAL_BASED_OUTPATIENT_CLINIC_OR_DEPARTMENT_OTHER): Payer: Self-pay | Admitting: Physical Therapy

## 2024-10-27 ENCOUNTER — Ambulatory Visit (HOSPITAL_BASED_OUTPATIENT_CLINIC_OR_DEPARTMENT_OTHER): Admitting: Physical Therapy

## 2024-10-27 DIAGNOSIS — M5459 Other low back pain: Secondary | ICD-10-CM | POA: Diagnosis not present

## 2024-10-27 DIAGNOSIS — R293 Abnormal posture: Secondary | ICD-10-CM

## 2024-10-27 DIAGNOSIS — R2681 Unsteadiness on feet: Secondary | ICD-10-CM

## 2024-10-27 DIAGNOSIS — G8929 Other chronic pain: Secondary | ICD-10-CM

## 2024-10-27 NOTE — Therapy (Signed)
 " OUTPATIENT PHYSICAL THERAPY THORACOLUMBAR EVALUATION   Patient Name: Kristine Olson MRN: [id number] DOB:[birth year], 79 y.o., female Today's Date: 10/27/2024  END OF SESSION:  PT End of Session - 10/27/24 1055     Visit Number 3    Date for Recertification  12/19/24    Authorization Type [employer]    Progress Note Due on Visit 10    PT Start Time 1015    PT Stop Time 1057    PT Time Calculation (min) 42 min    Activity Tolerance Patient tolerated treatment well    Behavior During Therapy WFL for tasks assessed/performed            Past Medical History:  Diagnosis Date   Acute pyloric channel ulcer with hemorrhage on Xarelto     Anemia    history of anemia after bleeding ulcer   Arthritis    ostearthritis - right  medial knee   Back pain    Cataract    beginning cataracts   Complication of anesthesia    was given a medication while waiting to go to surgery that caused burning sensation on scalp was given another medication and the burning went away   COVID-19 2022   Depression    Edema    GERD (gastroesophageal reflux disease)    heartburn daily-TUMS helpful   Hemorrhoid    History of blood transfusion    2 units PRBC   History of stomach ulcers    Hx of sinus tachycardia    rarely any problems since metoprolol  use   Hyperlipidemia    patient unaware, has never taking medication   Hypertension    Hypothyroidism    Joint pain    Osteoporosis    osteopenia   Sleep apnea    uses c pap   SOB (shortness of breath)    Varicose vein of leg    Vitamin D  deficiency    Past Surgical History:  Procedure Laterality Date   BIOPSY BREAST     2 biopsies & 2 aspirations   CESAREAN SECTION     CLOSED REDUCTION TOE FRACTURE     COLONOSCOPY  2015   COLONOSCOPY W/ POLYPECTOMY  multiple since 2007   adenomas, diverticulosis;last 2015   ESOPHAGOGASTRODUODENOSCOPY (EGD) WITH PROPOFOL  N/A 05/29/2016   Procedure: ESOPHAGOGASTRODUODENOSCOPY (EGD) WITH PROPOFOL ;   Surgeon: Lupita FORBES Commander, MD;  Location: [facility];  Service: Endoscopy;  Laterality: N/A;   HAMMER TOE SURGERY     JOINT REPLACEMENT     PARTIAL KNEE ARTHROPLASTY Right 05/22/2016   Procedure: RIGHT KNEE MEDIAL UNICOMPARTMENTAL ARTHROPLASTY;  Surgeon: Dempsey Moan, MD;  Location: [facility];  Service: Orthopedics;  Laterality: Right;   TONSILLECTOMY AND ADENOIDECTOMY     TOTAL KNEE ARTHROPLASTY Left 08/27/2017   Procedure: LEFT TOTAL KNEE ARTHROPLASTY;  Surgeon: Moan Dempsey, MD;  Location: [facility];  Service: Orthopedics;  Laterality: Left;   WISDOM TOOTH EXTRACTION     Patient Active Problem List   Diagnosis Date Noted   Positive ANA (antinuclear antibody) 07/16/2024   Bilateral hand pain 03/26/2024   Morbid obesity (HCC) with starting BMI 39 07/03/2023   B12 deficiency 10/26/2022   Insulin  resistance 10/26/2022   Pre-diabetes 10/26/2022   Other fatigue 10/12/2022   SOBOE (shortness of breath on exertion) 10/12/2022   Depression screen 10/12/2022   Essential hypertension 09/11/2022   OSA on CPAP 09/11/2022   Anemia, iron deficiency 05/29/2016   OA (osteoarthritis) of knee 05/22/2016   Physical exam 04/17/2016  Hemorrhoids 12/03/2015   Elevated serum creatinine 11/27/2014   Positional vertigo 10/18/2012   Fasting hyperglycemia 08/23/2012   Vitamin D  deficiency 08/23/2012   Osteopenia 07/06/2011   Menopausal and postmenopausal disorder 03/01/2010   GERD (gastroesophageal reflux disease) 03/05/2009   Hypothyroidism 11/20/2007   IBS 11/20/2007   History of cardiovascular disorder 11/20/2007   History of colonic polyps 11/20/2007    PCP: Comer Greet MD  REFERRING PROVIDER: Carilyn Prentice BRAVO, MD   REFERRING DIAG: G89.28 (ICD-10-CM) - Chronic post-operative pain   Rationale for Evaluation and Treatment: Rehabilitation  THERAPY DIAG:  Other low back pain  Abnormal posture  Bilateral chronic knee pain  Unsteadiness on feet  ONSET DATE: 5 yrs  SUBJECTIVE:                                                                                                                                                                                            SUBJECTIVE STATEMENT: I have been wearing the lift, no pain just knee stiffness  PERTINENT HISTORY:  L TKR 2018 R partial 2017  PAIN:  Are you having pain? Yes: NPRS scale: 0/10 current; 3/10 Knees; Back  0/10 current; 6/10 worst Pain location: LB mostly left hip, bilat knees Pain description: ache Aggravating factors: walking, grocery shopping Relieving factors: sitting; occasionally Tylenol ; rest  PRECAUTIONS: None  RED FLAGS: None   WEIGHT BEARING RESTRICTIONS: No  FALLS:  Has patient fallen in last 6 months? No   OCCUPATION: retired  PLOF: Independent  PATIENT GOALS: walk through grocery store; travel  NEXT MD VISIT: as needed  OBJECTIVE:  Note: Objective measures were completed at Evaluation unless otherwise noted.  DIAGNOSTIC FINDINGS:  None recent  PATIENT SURVEYS:  LEFs: 39/80 ODI: 15/50=30%  COGNITION: Overall cognitive status: Within functional limits for tasks assessed     SENSATION: WFL  MUSCLE LENGTH: Hamstrings: WFL tested in sitting   POSTURE:  -Thoracolumbar scoliosis with right dextroscoliosis and pelvic obliquity  -Left leg is one centimeter longer, likely due to left knee replacement and scoliosis    PALPATION: Mild TTP lumbar paraspinals  LUMBAR ROM:   wfl  LOWER EXTREMITY ROM:     WFL  LOWER EXTREMITY MMT:    MMT Right eval Left eval  Hip flexion 3 3+  Hip extension    Hip abduction 5 5  Hip adduction 5 5  Hip internal rotation    Hip external rotation    Knee flexion 5 5  Knee extension 4(HS cramping) 5  Ankle dorsiflexion 5 5  Ankle plantarflexion 5 5  Ankle inversion    Ankle eversion     (Blank  rows = not tested)  LUMBAR SPECIAL TESTS:  FABER's: Negative Distraction (supine): Negative Thigh thrust test:  Negative  FUNCTIONAL TESTS:  5 times sit to stand: 12.35 Timed up and go (TUG): 10.14 4 stage balance: Passed 1&2. Tandem assistance needed to gain position hold x 5s                             SLS: unable  GAIT: Distance walked: 500 ft Assistive device utilized: None Level of assistance: Complete Independence Comments: Normal. Initial gait pt reports is antalgic in am or after sitting for long periods of time  TREATMENT  [facility] Adult PT Treatment:                                                DATE: 10/27/24 Pt seen for aquatic therapy today.  Treatment took place in water  3.5-4.75 ft in depth at the [facility] pool. Temp of water  was 91.  Pt entered/exited the pool via stairs using step to pattern with hand rail.  *Intro to setting *walking forward, back and side stepping in 3.6-4.0 ft with ue support of barbell then unsupported  *HS and gastroc stretch at step *Ue support on wall: toe raises; heel raises; high knee marching; HS curls; hip add/abd; hip extension x 10 *L stretch.  Minimal benefit *seated on lift: cycling; hip add/abd; LAQ   Pt requires the buoyancy and hydrostatic pressure of water  for support, and to offload joints by unweighting joint load by at least 50 % in navel deep water  and by at least 75-80% in chest to neck deep water .  Viscosity of the water  is needed for resistance of strengthening. Water  current perturbations provides challenge to standing balance requiring increased core activation.     10/25/24 3-layer heel lift to Rt shoe Discussed shoe wear- possible fusion of 2nd & 3rd toes? Will monitor for pain See HEP for exercises  Eval:  Self care:Posture and body mechanic instruction; use of AD; Safety with traveling                                                                                                                                PATIENT EDUCATION:  Education details: Discussed eval findings, rehab rationale, aquatic program  progression/POC and pools in area. Patient is in agreement  Person educated: Patient Education method: Explanation Education comprehension: verbalized understanding  HOME EXERCISE PROGRAM: Aquatics TBA 3-layer heel lift in Rt shoe (10/25/24)  Access Code: [email or web address] URL: [email or web address] Date: 10/25/2024 Prepared by: Harlene Cordon  Exercises - Supine Hip Adduction Isometric with Ball  - 1 x daily - 7 x weekly - 1 sets - 10 reps - 3 breaths hold - Supine Bridge with Mini Swiss Ball Between Knees  - 1 x  daily - 7 x weekly - 3 sets - 10 reps - Sidelying Hip Abduction  - 1 x daily - 7 x weekly - 3 sets - 10 reps - Sidelying Hip Circles  - 1 x daily - 7 x weekly - 3 sets - 10 reps - supine SLR 3*10  ASSESSMENT:  CLINICAL IMPRESSION: Pt demonstrates safety and independence in aquatic setting with therapist instructing from deck. She is confident in setting, moving throughout all depths easily.  Pt is directed through various movement patterns and trials in both sitting and standing positions.  Due to her leg length difference she is most comfortable facing lap pool when amb and exercising. Pt reports compliance with shoe lift as instructed last session.  Reports she has not had any increase in concordant pain. Pt is provided VC and demonstration throughout session for execution of exercises while monitoring toleration. She demonstrates good tolerance of entire session.  No reports or indications of pain nor fatigue.  She is a good candidate for aquatic intervention and will benefit from the properties of water  to progress towards functional goals.      EVAL: Patient is a 79 y.o. f who was seen today for physical therapy evaluation and treatment for post operative bilateral knee pain of which have both had surgical intervention in past 10 years. She also complains of LBP which radiates into her left hip. There are no recent xrays in chart but has been dx with a mild  thoracolumbar right dextroscoliosis and pelvic obliquity in 2023 which appears to have progressed causing increased pain sensitivity and reduction in toleration to activity. Her objective testing as completed today are falling [facility] other than some minor balance difficulties. She reports decreased ability to walk through grocery stores and airports. She has become mostly sedentary as time has progressed as she has no pain in sitting and reports embarrassment for needing AD's or extra assistance. Pt will benefit from skilled PT intervention in both aquatics and land to improve core strength, improve posture, balance, manage pain and return pt to a routine exercises regimen.  Pt scheduled to be seen on land next session. Please assess need/benefit for lift in shoe and initiate HEP addressing pelvic obliquity and back strengthening/stretching.  OBJECTIVE IMPAIRMENTS: decreased activity tolerance, decreased balance, decreased knowledge of use of DME, difficulty walking, postural dysfunction, obesity, and pain.   ACTIVITY LIMITATIONS: carrying, lifting, squatting, and locomotion level  PARTICIPATION LIMITATIONS: shopping, community activity, and yard work  PERSONAL FACTORS: Fitness and Time since onset of injury/illness/exacerbation are also affecting patient's functional outcome.   REHAB POTENTIAL: Good  CLINICAL DECISION MAKING: Stable/uncomplicated  EVALUATION COMPLEXITY: Moderate   GOALS: Goals reviewed with patient? Yes  SHORT TERM GOALS: Target date: 11/21/24  Pt will tolerate full aquatic sessions consistently without increase in pain and with improving function to demonstrate good toleration and effectiveness of intervention.  Baseline: Goal status: INITIAL  2.  Pt to trial shoe lift as deemed approp by land based therapist for reduction in Left hip pain Baseline:  Goal status: INITIAL  3.  Pt will report returning to [place] and/or return to consistent exercise regimen Baseline:  stopped x 1 yr Goal status: INITIAL    LONG TERM GOALS: Target date: 12/19/24  Pt to improve on LEFS by at least 9 point to demonstrate statistically significant Improvement in function. Baseline: 39/80 Goal status: INITIAL  2.  Pt to improve on ODI by  13% to demonstrate statistically significant Improvement in function. (MCID 13-15%) Baseline:15/50=30%  Goal status: INITIAL  3.  Pt will report completing full grocery shop without limitation to pain. Baseline: unable Goal status: INITIAL  4.  Pt will report no hesitation with traveling with or without modifications for toleration Baseline:  Goal status: INITIAL  5.  Pt will perform tandem and SL stance x 10s demonstrating improved balance ability. Baseline:  Goal status: INITIAL  6.  Pt will be indep with final HEP's (land and aquatic as appropriate) for continued management of condition Baseline:  Goal status: INITIAL  PLAN:  PT FREQUENCY: 1-2x/week  PT DURATION: 8 weeks  PLANNED INTERVENTIONS: [id number]- PT Re-evaluation, 97750- Physical Performance Testing, 97110-Therapeutic exercises, 97530- Therapeutic activity, [id number]- Neuromuscular re-education, 97535- Self Care, [id number]- Manual therapy, [id number]- Gait training, [id number]- Orthotic Initial, [phone]- Aquatic Therapy, [phone]- Electrical stimulation (unattended), [phone]- Electrical stimulation (manual), [id number]- Ionotophoresis 4mg /ml Dexamethasone , 79439 (1-2 muscles), [id number] (3+ muscles)- Dry Needling, Patient/Family education, Balance training, Stair training, Taping, Joint mobilization, DME instructions, Cryotherapy, and Moist heat.  PLAN FOR NEXT SESSION: Land based: assess for shoe lift; Hep for core stretching and strengthening Aquatics: postural stretching and strengthening, toleration to activity, pain management- try to work with Rt foot uphill to make up for lack of lift while in pool   [place] Sayla Golonka MPT 10/27/2024 10:58 AM [facility] [address] [place], [place], [address] Phone: [phone]   Fax:  [phone]    Referring diagnosis? G89.28 (ICD-10-CM) - Chronic post-operative pain  Treatment diagnosis? (if different than referring diagnosis) other LBP; knee pain; muscle weakness What was this (referring dx) caused by? []  Surgery []  Fall [x]  Ongoing issue []  Arthritis []  Other: ____________  Laterality: []  Rt []  Lt [x]  Both  Check all possible CPT codes:  *CHOOSE 10 OR LESS*    See Planned Interventions listed in the Plan section of the Evaluation.    "

## 2024-10-30 ENCOUNTER — Other Ambulatory Visit: Payer: Self-pay | Admitting: Family Medicine

## 2024-11-03 ENCOUNTER — Encounter (HOSPITAL_BASED_OUTPATIENT_CLINIC_OR_DEPARTMENT_OTHER): Payer: Self-pay | Admitting: Physical Therapy

## 2024-11-03 ENCOUNTER — Ambulatory Visit (HOSPITAL_BASED_OUTPATIENT_CLINIC_OR_DEPARTMENT_OTHER): Admitting: Physical Therapy

## 2024-11-03 DIAGNOSIS — M5459 Other low back pain: Secondary | ICD-10-CM

## 2024-11-03 DIAGNOSIS — R293 Abnormal posture: Secondary | ICD-10-CM

## 2024-11-03 DIAGNOSIS — G8929 Other chronic pain: Secondary | ICD-10-CM

## 2024-11-03 NOTE — Therapy (Signed)
 " OUTPATIENT PHYSICAL THERAPY THORACOLUMBAR TREATMENT   Patient Name: Kristine Olson MRN: [id number] DOB:December 12, 1945, 79 y.o., female Today's Date: 11/03/2024  END OF SESSION:  PT End of Session - 11/03/24 1022     Visit Number 4    Date for Recertification  12/19/24    Authorization Type [employer]    Progress Note Due on Visit 10    PT Start Time 1015    PT Stop Time 1053    PT Time Calculation (min) 38 min    Activity Tolerance Patient tolerated treatment well    Behavior During Therapy WFL for tasks assessed/performed            Past Medical History:  Diagnosis Date   Acute pyloric channel ulcer with hemorrhage on Xarelto     Anemia    history of anemia after bleeding ulcer   Arthritis    ostearthritis - right  medial knee   Back pain    Cataract    beginning cataracts   Complication of anesthesia    was given a medication while waiting to go to surgery that caused burning sensation on scalp was given another medication and the burning went away   COVID-19 2022   Depression    Edema    GERD (gastroesophageal reflux disease)    heartburn daily-TUMS helpful   Hemorrhoid    History of blood transfusion    2 units PRBC   History of stomach ulcers    Hx of sinus tachycardia    rarely any problems since metoprolol  use   Hyperlipidemia    patient unaware, has never taking medication   Hypertension    Hypothyroidism    Joint pain    Osteoporosis    osteopenia   Sleep apnea    uses c pap   SOB (shortness of breath)    Varicose vein of leg    Vitamin D  deficiency    Past Surgical History:  Procedure Laterality Date   BIOPSY BREAST     2 biopsies & 2 aspirations   CESAREAN SECTION     CLOSED REDUCTION TOE FRACTURE     COLONOSCOPY  2015   COLONOSCOPY W/ POLYPECTOMY  multiple since 2007   adenomas, diverticulosis;last 2015   ESOPHAGOGASTRODUODENOSCOPY (EGD) WITH PROPOFOL  N/A 05/29/2016   Procedure: ESOPHAGOGASTRODUODENOSCOPY (EGD) WITH PROPOFOL ;   Surgeon: Lupita FORBES Commander, MD;  Location: [facility];  Service: Endoscopy;  Laterality: N/A;   HAMMER TOE SURGERY     JOINT REPLACEMENT     PARTIAL KNEE ARTHROPLASTY Right 05/22/2016   Procedure: RIGHT KNEE MEDIAL UNICOMPARTMENTAL ARTHROPLASTY;  Surgeon: Dempsey Moan, MD;  Location: [facility];  Service: Orthopedics;  Laterality: Right;   TONSILLECTOMY AND ADENOIDECTOMY     TOTAL KNEE ARTHROPLASTY Left 08/27/2017   Procedure: LEFT TOTAL KNEE ARTHROPLASTY;  Surgeon: Moan Dempsey, MD;  Location: [facility];  Service: Orthopedics;  Laterality: Left;   WISDOM TOOTH EXTRACTION     Patient Active Problem List   Diagnosis Date Noted   Positive ANA (antinuclear antibody) 07/16/2024   Bilateral hand pain 03/26/2024   Morbid obesity (HCC) with starting BMI 39 07/03/2023   B12 deficiency 10/26/2022   Insulin  resistance 10/26/2022   Pre-diabetes 10/26/2022   Other fatigue 10/12/2022   SOBOE (shortness of breath on exertion) 10/12/2022   Depression screen 10/12/2022   Essential hypertension 09/11/2022   OSA on CPAP 09/11/2022   Anemia, iron deficiency 05/29/2016   OA (osteoarthritis) of knee 05/22/2016   Physical exam 04/17/2016  Hemorrhoids 12/03/2015   Elevated serum creatinine 11/27/2014   Positional vertigo 10/18/2012   Fasting hyperglycemia 08/23/2012   Vitamin D  deficiency 08/23/2012   Osteopenia 07/06/2011   Menopausal and postmenopausal disorder 03/01/2010   GERD (gastroesophageal reflux disease) 03/05/2009   Hypothyroidism 11/20/2007   IBS 11/20/2007   History of cardiovascular disorder 11/20/2007   History of colonic polyps 11/20/2007    PCP: Comer Greet MD  REFERRING PROVIDER: Carilyn Prentice BRAVO, MD   REFERRING DIAG: G89.28 (ICD-10-CM) - Chronic post-operative pain   Rationale for Evaluation and Treatment: Rehabilitation  THERAPY DIAG:  Other low back pain  Abnormal posture  Bilateral chronic knee pain  ONSET DATE: 5 yrs  SUBJECTIVE:                                                                                                                                                                                            SUBJECTIVE STATEMENT: Pt reports she did well after aquatic  no pain just knee stiffness  PERTINENT HISTORY:  L TKR 2018 R partial 2017  PAIN:  Are you having pain? no: NPRS scale: 0/10 current Pain location:  Pain description: tight, stiff Aggravating factors: walking, grocery shopping Relieving factors: sitting; occasionally Tylenol ; rest  PRECAUTIONS: None  RED FLAGS: None   WEIGHT BEARING RESTRICTIONS: No  FALLS:  Has patient fallen in last 6 months? No   OCCUPATION: retired  PLOF: Independent  PATIENT GOALS: walk through grocery store; travel  NEXT MD VISIT: as needed  OBJECTIVE:  Note: Objective measures were completed at Evaluation unless otherwise noted.  DIAGNOSTIC FINDINGS:  None recent  PATIENT SURVEYS:  LEFs: 39/80 ODI: 15/50=30%  COGNITION: Overall cognitive status: Within functional limits for tasks assessed     SENSATION: WFL  MUSCLE LENGTH: Hamstrings: WFL tested in sitting   POSTURE:  -Thoracolumbar scoliosis with right dextroscoliosis and pelvic obliquity  -Left leg is one centimeter longer, likely due to left knee replacement and scoliosis    PALPATION: Mild TTP lumbar paraspinals  LUMBAR ROM:   wfl  LOWER EXTREMITY ROM:     WFL  LOWER EXTREMITY MMT:    MMT Right eval Left eval  Hip flexion 3 3+  Hip extension    Hip abduction 5 5  Hip adduction 5 5  Hip internal rotation    Hip external rotation    Knee flexion 5 5  Knee extension 4(HS cramping) 5  Ankle dorsiflexion 5 5  Ankle plantarflexion 5 5  Ankle inversion    Ankle eversion     (Blank rows = not tested)  LUMBAR SPECIAL TESTS:  FABER's: Negative Distraction (supine): Negative  Thigh thrust test: Negative  FUNCTIONAL TESTS:  5 times sit to stand: 12.35 Timed up and go (TUG): 10.14 4 stage  balance: Passed 1&2. Tandem assistance needed to gain position hold x 5s                             SLS: unable  GAIT: Distance walked: 500 ft Assistive device utilized: None Level of assistance: Complete Independence Comments: Normal. Initial gait pt reports is antalgic in am or after sitting for long periods of time  TREATMENT  [facility] Adult PT Treatment:                                                DATE: 11/03/24 Pt seen for aquatic therapy today.  Treatment took place in water  3.5-4.75 ft in depth at the [facility] pool. Temp of water  was 91.  Pt entered/exited the pool via stairs using step to pattern with hand rail.  (Feels best facing lap pool)  *unsupported: walking forward and backward, multiple laps with cues for even step length  *side stepping with arm add/abdct -> with rainbow hand floats  *UE support on wall: toe/heel raises; hip add/abd x 10 each; leg swings into hip flexion/ extension x 15 * TrA set with 1/2 hollow noodle pull down to thighs in standard stance and staggered stance -> repeated with long hollow noodle  * TrA set with alternating knee taps to rainbow hand floats -> while marching-> to opposite knee taps    Pt requires the buoyancy and hydrostatic pressure of water  for support, and to offload joints by unweighting joint load by at least 50 % in navel deep water  and by at least 75-80% in chest to neck deep water .  Viscosity of the water  is needed for resistance of strengthening. Water  current perturbations provides challenge to standing balance requiring increased core activation.       PATIENT EDUCATION:  Education details: intro to aquatic therapy Person educated: Patient Education method: Explanation Education comprehension: verbalized understanding  HOME EXERCISE PROGRAM: Aquatics TBA  3-layer heel lift in Rt shoe (10/25/24)  Access Code: [email or web address] URL: [email or web address] Date: 10/25/2024 Prepared by: Harlene Cordon  Exercises - Supine Hip Adduction Isometric with Ball  - 1 x daily - 7 x weekly - 1 sets - 10 reps - 3 breaths hold - Supine Bridge with Mini Swiss Ball Between Knees  - 1 x daily - 7 x weekly - 3 sets - 10 reps - Sidelying Hip Abduction  - 1 x daily - 7 x weekly - 3 sets - 10 reps - Sidelying Hip Circles  - 1 x daily - 7 x weekly - 3 sets - 10 reps - supine SLR 3*10  ASSESSMENT:  CLINICAL IMPRESSION: Pt demonstrates good tolerance of progression of aquatic therapy exercise.  No reports or indications of pain nor fatigue.  She remains a good candidate for aquatic intervention and will benefit from the properties of water  to progress towards functional goals.  Will continue to progress as tolerated.        EVAL: Patient is a 79 y.o. f who was seen today for physical therapy evaluation and treatment for post operative bilateral knee pain of which have both had surgical intervention in past 10 years. She also complains of LBP which radiates into  her left hip. There are no recent xrays in chart but has been dx with a mild thoracolumbar right dextroscoliosis and pelvic obliquity in 2023 which appears to have progressed causing increased pain sensitivity and reduction in toleration to activity. Her objective testing as completed today are falling [facility] other than some minor balance difficulties. She reports decreased ability to walk through grocery stores and airports. She has become mostly sedentary as time has progressed as she has no pain in sitting and reports embarrassment for needing AD's or extra assistance. Pt will benefit from skilled PT intervention in both aquatics and land to improve core strength, improve posture, balance, manage pain and return pt to a routine exercises regimen.  Pt scheduled to be seen on land next session. Please assess need/benefit for lift in shoe and initiate HEP addressing pelvic obliquity and back strengthening/stretching.  OBJECTIVE IMPAIRMENTS:  decreased activity tolerance, decreased balance, decreased knowledge of use of DME, difficulty walking, postural dysfunction, obesity, and pain.   ACTIVITY LIMITATIONS: carrying, lifting, squatting, and locomotion level  PARTICIPATION LIMITATIONS: shopping, community activity, and yard work  PERSONAL FACTORS: Fitness and Time since onset of injury/illness/exacerbation are also affecting patient's functional outcome.   REHAB POTENTIAL: Good  CLINICAL DECISION MAKING: Stable/uncomplicated  EVALUATION COMPLEXITY: Moderate   GOALS: Goals reviewed with patient? Yes  SHORT TERM GOALS: Target date: 11/21/24  Pt will tolerate full aquatic sessions consistently without increase in pain and with improving function to demonstrate good toleration and effectiveness of intervention.  Baseline: Goal status: INITIAL  2.  Pt to trial shoe lift as deemed approp by land based therapist for reduction in Left hip pain Baseline:  Goal status: INITIAL  3.  Pt will report returning to [facility] and/or return to consistent exercise regimen Baseline: stopped x 1 yr Goal status: INITIAL    LONG TERM GOALS: Target date: 12/19/24  Pt to improve on LEFS by at least 9 point to demonstrate statistically significant Improvement in function. Baseline: 39/80 Goal status: INITIAL  2.  Pt to improve on ODI by  13% to demonstrate statistically significant Improvement in function. (MCID 13-15%) Baseline:15/50=30% Goal status: INITIAL  3.  Pt will report completing full grocery shop without limitation to pain. Baseline: unable Goal status: INITIAL  4.  Pt will report no hesitation with traveling with or without modifications for toleration Baseline:  Goal status: INITIAL  5.  Pt will perform tandem and SL stance x 10s demonstrating improved balance ability. Baseline:  Goal status: INITIAL  6.  Pt will be indep with final HEP's (land and aquatic as appropriate) for continued management of  condition Baseline:  Goal status: INITIAL  PLAN:  PT FREQUENCY: 1-2x/week  PT DURATION: 8 weeks  PLANNED INTERVENTIONS: [id number]- PT Re-evaluation, 97750- Physical Performance Testing, 97110-Therapeutic exercises, 97530- Therapeutic activity, 97112- Neuromuscular re-education, 97535- Self Care, [id number]- Manual therapy, [id number]- Gait training, [phone]- Orthotic Initial, [phone]- Aquatic Therapy, [phone]- Electrical stimulation (unattended), [phone]- Electrical stimulation (manual), [id number]- Ionotophoresis 4mg /ml Dexamethasone , 79439 (1-2 muscles), 20561 (3+ muscles)- Dry Needling, Patient/Family education, Balance training, Stair training, Taping, Joint mobilization, DME instructions, Cryotherapy, and Moist heat.  PLAN FOR NEXT SESSION: Land based: assess for shoe lift; Hep for core stretching and strengthening Aquatics: postural stretching and strengthening, toleration to activity, pain management- try to work with Rt foot uphill to make up for lack of lift while in pool   [place], [place] 11/03/2024 12:59 PM [facility] [address] [place], [place], [address] Phone: [phone]  Fax:  [phone]     Referring diagnosis? G89.28 (ICD-10-CM) - Chronic post-operative pain  Treatment diagnosis? (if different than referring diagnosis) other LBP; knee pain; muscle weakness What was this (referring dx) caused by? []  Surgery []  Fall [x]  Ongoing issue []  Arthritis []  Other: ____________  Laterality: []  Rt []  Lt [x]  Both  Check all possible CPT codes:  *CHOOSE 10 OR LESS*    See Planned Interventions listed in the Plan section of the Evaluation.    "

## 2024-11-06 ENCOUNTER — Ambulatory Visit (HOSPITAL_BASED_OUTPATIENT_CLINIC_OR_DEPARTMENT_OTHER): Admitting: Physical Therapy

## 2024-11-06 ENCOUNTER — Encounter (HOSPITAL_BASED_OUTPATIENT_CLINIC_OR_DEPARTMENT_OTHER): Payer: Self-pay

## 2024-11-10 ENCOUNTER — Encounter: Payer: Self-pay | Admitting: Family Medicine

## 2024-11-10 ENCOUNTER — Ambulatory Visit (HOSPITAL_BASED_OUTPATIENT_CLINIC_OR_DEPARTMENT_OTHER): Admitting: Physical Therapy

## 2024-11-10 ENCOUNTER — Ambulatory Visit: Admitting: Family Medicine

## 2024-11-10 ENCOUNTER — Encounter (HOSPITAL_BASED_OUTPATIENT_CLINIC_OR_DEPARTMENT_OTHER): Payer: Self-pay | Admitting: Physical Therapy

## 2024-11-10 VITALS — BP 110/73 | HR 75 | Ht 63.0 in | Wt 202.0 lb

## 2024-11-10 DIAGNOSIS — R2681 Unsteadiness on feet: Secondary | ICD-10-CM

## 2024-11-10 DIAGNOSIS — G4733 Obstructive sleep apnea (adult) (pediatric): Secondary | ICD-10-CM | POA: Diagnosis not present

## 2024-11-10 DIAGNOSIS — E6609 Other obesity due to excess calories: Secondary | ICD-10-CM

## 2024-11-10 DIAGNOSIS — M130 Polyarthritis, unspecified: Secondary | ICD-10-CM

## 2024-11-10 DIAGNOSIS — E66812 Obesity, class 2: Secondary | ICD-10-CM

## 2024-11-10 DIAGNOSIS — M5459 Other low back pain: Secondary | ICD-10-CM | POA: Diagnosis not present

## 2024-11-10 DIAGNOSIS — Z6835 Body mass index (BMI) 35.0-35.9, adult: Secondary | ICD-10-CM | POA: Diagnosis not present

## 2024-11-10 DIAGNOSIS — R7303 Prediabetes: Secondary | ICD-10-CM

## 2024-11-10 DIAGNOSIS — G8929 Other chronic pain: Secondary | ICD-10-CM

## 2024-11-10 DIAGNOSIS — R293 Abnormal posture: Secondary | ICD-10-CM

## 2024-11-10 MED ORDER — ZEPBOUND 5 MG/0.5ML ~~LOC~~ SOAJ: 5.0000 mg | SUBCUTANEOUS | 0 refills | Status: AC

## 2024-11-10 NOTE — Therapy (Signed)
 " OUTPATIENT PHYSICAL THERAPY THORACOLUMBAR TREATMENT   Patient Name: Kristine Olson MRN: [id number] DOB:1946-02-04, 79 y.o., female Today's Date: 11/10/2024  END OF SESSION:  PT End of Session - 11/10/24 1029     Visit Number 5    Date for Recertification  12/19/24    Authorization Type [employer]    Progress Note Due on Visit 10    PT Start Time 1025    PT Stop Time 1105    PT Time Calculation (min) 40 min    Activity Tolerance Patient tolerated treatment well    Behavior During Therapy WFL for tasks assessed/performed            Past Medical History:  Diagnosis Date   Acute pyloric channel ulcer with hemorrhage on Xarelto     Anemia    history of anemia after bleeding ulcer   Arthritis    ostearthritis - right  medial knee   Back pain    Cataract    beginning cataracts   Complication of anesthesia    was given a medication while waiting to go to surgery that caused burning sensation on scalp was given another medication and the burning went away   COVID-19 2022   Depression    Edema    GERD (gastroesophageal reflux disease)    heartburn daily-TUMS helpful   Hemorrhoid    History of blood transfusion    2 units PRBC   History of stomach ulcers    Hx of sinus tachycardia    rarely any problems since metoprolol  use   Hyperlipidemia    patient unaware, has never taking medication   Hypertension    Hypothyroidism    Joint pain    Osteoporosis    osteopenia   Sleep apnea    uses c pap   SOB (shortness of breath)    Varicose vein of leg    Vitamin D  deficiency    Past Surgical History:  Procedure Laterality Date   BIOPSY BREAST     2 biopsies & 2 aspirations   CESAREAN SECTION     CLOSED REDUCTION TOE FRACTURE     COLONOSCOPY  2015   COLONOSCOPY W/ POLYPECTOMY  multiple since 2007   adenomas, diverticulosis;last 2015   ESOPHAGOGASTRODUODENOSCOPY (EGD) WITH PROPOFOL  N/A 05/29/2016   Procedure: ESOPHAGOGASTRODUODENOSCOPY (EGD) WITH PROPOFOL ;   Surgeon: Lupita FORBES Commander, MD;  Location: [facility];  Service: Endoscopy;  Laterality: N/A;   HAMMER TOE SURGERY     JOINT REPLACEMENT     PARTIAL KNEE ARTHROPLASTY Right 05/22/2016   Procedure: RIGHT KNEE MEDIAL UNICOMPARTMENTAL ARTHROPLASTY;  Surgeon: Dempsey Moan, MD;  Location: [facility];  Service: Orthopedics;  Laterality: Right;   TONSILLECTOMY AND ADENOIDECTOMY     TOTAL KNEE ARTHROPLASTY Left 08/27/2017   Procedure: LEFT TOTAL KNEE ARTHROPLASTY;  Surgeon: Moan Dempsey, MD;  Location: [facility];  Service: Orthopedics;  Laterality: Left;   WISDOM TOOTH EXTRACTION     Patient Active Problem List   Diagnosis Date Noted   Positive ANA (antinuclear antibody) 07/16/2024   Bilateral hand pain 03/26/2024   Morbid obesity (HCC) with starting BMI 39 07/03/2023   B12 deficiency 10/26/2022   Insulin  resistance 10/26/2022   Pre-diabetes 10/26/2022   Other fatigue 10/12/2022   SOBOE (shortness of breath on exertion) 10/12/2022   Depression screen 10/12/2022   Essential hypertension 09/11/2022   OSA on CPAP 09/11/2022   Anemia, iron deficiency 05/29/2016   OA (osteoarthritis) of knee 05/22/2016   Physical exam 04/17/2016  Hemorrhoids 12/03/2015   Elevated serum creatinine 11/27/2014   Positional vertigo 10/18/2012   Fasting hyperglycemia 08/23/2012   Vitamin D  deficiency 08/23/2012   Osteopenia 07/06/2011   Menopausal and postmenopausal disorder 03/01/2010   GERD (gastroesophageal reflux disease) 03/05/2009   Hypothyroidism 11/20/2007   IBS 11/20/2007   History of cardiovascular disorder 11/20/2007   History of colonic polyps 11/20/2007    PCP: Comer Greet MD  REFERRING PROVIDER: Carilyn Prentice BRAVO, MD   REFERRING DIAG: G89.28 (ICD-10-CM) - Chronic post-operative pain   Rationale for Evaluation and Treatment: Rehabilitation  THERAPY DIAG:  Other low back pain  Abnormal posture  Bilateral chronic knee pain  Unsteadiness on feet  ONSET DATE: 5 yrs  SUBJECTIVE:                                                                                                                                                                                            SUBJECTIVE STATEMENT: Pt reports feeling tightness around knee which is uncomfortable 3/10. Have not been doing the exercise on my side that Harlene gave me because it hurts and I am worried about creating another problem  PERTINENT HISTORY:  L TKR 2018 R partial 2017  PAIN:  Are you having pain? no: NPRS scale: 0/10 current Pain location:  Pain description: tight, stiff Aggravating factors: walking, grocery shopping Relieving factors: sitting; occasionally Tylenol ; rest  PRECAUTIONS: None  RED FLAGS: None   WEIGHT BEARING RESTRICTIONS: No  FALLS:  Has patient fallen in last 6 months? No   OCCUPATION: retired  PLOF: Independent  PATIENT GOALS: walk through grocery store; travel  NEXT MD VISIT: as needed  OBJECTIVE:  Note: Objective measures were completed at Evaluation unless otherwise noted.  DIAGNOSTIC FINDINGS:  None recent  PATIENT SURVEYS:  LEFs: 39/80 ODI: 15/50=30%  COGNITION: Overall cognitive status: Within functional limits for tasks assessed     SENSATION: WFL  MUSCLE LENGTH: Hamstrings: WFL tested in sitting   POSTURE:  -Thoracolumbar scoliosis with right dextroscoliosis and pelvic obliquity  -Left leg is one centimeter longer, likely due to left knee replacement and scoliosis    PALPATION: Mild TTP lumbar paraspinals  LUMBAR ROM:   wfl  LOWER EXTREMITY ROM:     WFL  LOWER EXTREMITY MMT:    MMT Right eval Left eval  Hip flexion 3 3+  Hip extension    Hip abduction 5 5  Hip adduction 5 5  Hip internal rotation    Hip external rotation    Knee flexion 5 5  Knee extension 4(HS cramping) 5  Ankle dorsiflexion 5 5  Ankle plantarflexion 5 5  Ankle  inversion    Ankle eversion     (Blank rows = not tested)  LUMBAR SPECIAL TESTS:  FABER's:  Negative Distraction (supine): Negative Thigh thrust test: Negative  FUNCTIONAL TESTS:  5 times sit to stand: 12.35 Timed up and go (TUG): 10.14 4 stage balance: Passed 1&2. Tandem assistance needed to gain position hold x 5s                             SLS: unable  GAIT: Distance walked: 500 ft Assistive device utilized: None Level of assistance: Complete Independence Comments: Normal. Initial gait pt reports is antalgic in am or after sitting for long periods of time  TREATMENT  [facility] Adult PT Treatment:                                                DATE: 11/10/24 Pt seen for aquatic therapy today.  Treatment took place in water  3.5-4.75 ft in depth at the [facility] pool. Temp of water  was 91.  Pt entered/exited the pool via stairs using step to pattern with hand rail.  (Feels best facing lap pool)  *unsupported: walking/marching forward and backward, multiple laps with cues for even step length  *side stepping with arm add/abdct -> with rainbow hand floats  *stretching: quad/hamstring/gastroc on 2nd step *stair tapping bottom step then 2nd step ue support yellow Hb x 10 each step *UE support yellow HB: toe/heel raises; hip add/abd x 10 each * TrA set with full hollow noodle pull down to thighs in standard stance and staggered stance x 5-10 chest deep * TrA set with alternating knee taps to rainbow hand floats -> while marching-> to opposite knee taps    Pt requires the buoyancy and hydrostatic pressure of water  for support, and to offload joints by unweighting joint load by at least 50 % in navel deep water  and by at least 75-80% in chest to neck deep water .  Viscosity of the water  is needed for resistance of strengthening. Water  current perturbations provides challenge to standing balance requiring increased core activation.       PATIENT EDUCATION:  Education details: intro to aquatic therapy Person educated: Patient Education method: Explanation Education  comprehension: verbalized understanding  HOME EXERCISE PROGRAM: Aquatics TBA  3-layer heel lift in Rt shoe (10/25/24)  Access Code: [email or web address] URL: [email or web address] Date: 10/25/2024 Prepared by: Harlene Cordon  Exercises - Supine Hip Adduction Isometric with Ball  - 1 x daily - 7 x weekly - 1 sets - 10 reps - 3 breaths hold - Supine Bridge with Mini Swiss Ball Between Knees  - 1 x daily - 7 x weekly - 3 sets - 10 reps - Sidelying Hip Abduction  - 1 x daily - 7 x weekly - 3 sets - 10 reps - Sidelying Hip Circles  - 1 x daily - 7 x weekly - 3 sets - 10 reps - supine SLR 3*10  ASSESSMENT:  CLINICAL IMPRESSION: Pt instructed to reduce reps of side lying abd and circles to 5 reps for improve toleration of HEP.  She vu. Added time stretching to reduced perceived tightness in R>L knees today with good response. She reports continued use of lift in shore which she believes is helping her amb. Improved balance and strength today with toleration and completion of standing exercises  with reduced ue support.  Some unsteadiness but no LOB. Cues for core stabilization for improved control.  2 STG met. Goals ongoing       EVAL: Patient is a 79 y.o. f who was seen today for physical therapy evaluation and treatment for post operative bilateral knee pain of which have both had surgical intervention in past 10 years. She also complains of LBP which radiates into her left hip. There are no recent xrays in chart but has been dx with a mild thoracolumbar right dextroscoliosis and pelvic obliquity in 2023 which appears to have progressed causing increased pain sensitivity and reduction in toleration to activity. Her objective testing as completed today are falling [facility] other than some minor balance difficulties. She reports decreased ability to walk through grocery stores and airports. She has become mostly sedentary as time has progressed as she has no pain in sitting and reports  embarrassment for needing AD's or extra assistance. Pt will benefit from skilled PT intervention in both aquatics and land to improve core strength, improve posture, balance, manage pain and return pt to a routine exercises regimen.  Pt scheduled to be seen on land next session. Please assess need/benefit for lift in shoe and initiate HEP addressing pelvic obliquity and back strengthening/stretching.  OBJECTIVE IMPAIRMENTS: decreased activity tolerance, decreased balance, decreased knowledge of use of DME, difficulty walking, postural dysfunction, obesity, and pain.   ACTIVITY LIMITATIONS: carrying, lifting, squatting, and locomotion level  PARTICIPATION LIMITATIONS: shopping, community activity, and yard work  PERSONAL FACTORS: Fitness and Time since onset of injury/illness/exacerbation are also affecting patient's functional outcome.   REHAB POTENTIAL: Good  CLINICAL DECISION MAKING: Stable/uncomplicated  EVALUATION COMPLEXITY: Moderate   GOALS: Goals reviewed with patient? Yes  SHORT TERM GOALS: Target date: 11/21/24  Pt will tolerate full aquatic sessions consistently without increase in pain and with improving function to demonstrate good toleration and effectiveness of intervention.  Baseline: Goal status: Met 11/10/24  2.  Pt to trial shoe lift as deemed approp by land based therapist for reduction in Left hip pain Baseline:  Goal status: Met 11/10/24  3.  Pt will report returning to [place] and/or return to consistent exercise regimen Baseline: stopped x 1 yr Goal status: INITIAL    LONG TERM GOALS: Target date: 12/19/24  Pt to improve on LEFS by at least 9 point to demonstrate statistically significant Improvement in function. Baseline: 39/80 Goal status: INITIAL  2.  Pt to improve on ODI by  13% to demonstrate statistically significant Improvement in function. (MCID 13-15%) Baseline:15/50=30% Goal status: INITIAL  3.  Pt will report completing full grocery shop  without limitation to pain. Baseline: unable Goal status: INITIAL  4.  Pt will report no hesitation with traveling with or without modifications for toleration Baseline:  Goal status: INITIAL  5.  Pt will perform tandem and SL stance x 10s demonstrating improved balance ability. Baseline:  Goal status: INITIAL  6.  Pt will be indep with final HEP's (land and aquatic as appropriate) for continued management of condition Baseline:  Goal status: INITIAL  PLAN:  PT FREQUENCY: 1-2x/week  PT DURATION: 8 weeks  PLANNED INTERVENTIONS: 97164- PT Re-evaluation, 97750- Physical Performance Testing, 97110-Therapeutic exercises, 97530- Therapeutic activity, 97112- Neuromuscular re-education, 97535- Self Care, 02859- Manual therapy, [id number]- Gait training, [phone]- Orthotic Initial, [phone]- Aquatic Therapy, [phone]- Electrical stimulation (unattended), [phone]- Electrical stimulation (manual), [id number]- Ionotophoresis 4mg /ml Dexamethasone , 79439 (1-2 muscles), 20561 (3+ muscles)- Dry Needling, Patient/Family education, Balance training, Stair training, Taping, Joint mobilization, DME  instructions, Cryotherapy, and Moist heat.  PLAN FOR NEXT SESSION: Land based: assess for shoe lift; Hep for core stretching and strengthening Aquatics: postural stretching and strengthening, toleration to activity, pain management- try to work with Rt foot uphill to make up for lack of lift while in pool   [place] ([place]) Margarie Mcguirt MPT 11/10/24 11:22 AM [facility] [address] [place], [place], [address] Phone: [phone]   Fax:  [phone]      Referring diagnosis? G89.28 (ICD-10-CM) - Chronic post-operative pain  Treatment diagnosis? (if different than referring diagnosis) other LBP; knee pain; muscle weakness What was this (referring dx) caused by? []  Surgery []  Fall [x]  Ongoing issue []  Arthritis []  Other: ____________  Laterality: []  Rt []  Lt [x]   Both  Check all possible CPT codes:  *CHOOSE 10 OR LESS*    See Planned Interventions listed in the Plan section of the Evaluation.    "

## 2024-11-10 NOTE — Progress Notes (Signed)
 "  Office: [phone]  /  Fax: [phone]  WEIGHT SUMMARY AND BIOMETRICS  Starting Date: 10/12/22  Starting Weight: 222lb   Weight Lost Since Last Visit: 4lb   Vitals BP: 110/73 Pulse Rate: 75 SpO2: 98 %   Body Composition  Body Fat %: 46.6 % Fat Mass (lbs): 94.2 lbs Muscle Mass (lbs): 102.6 lbs Total Body Water  (lbs): 74.2 lbs Visceral Fat Rating : 15     HPI  Chief Complaint: OBESITY  Kristine Olson is here to discuss her progress with her obesity treatment plan. She is on the the Category 1 Plan and states she is following her eating plan approximately 75-80 % of the time. She states she is exercising 30 minutes 2 times per week.   Interval History:  Since last office visit she is down 4 lb This gives her a net weight loss of 20 lb in the past 2 years Denies meal skipping, hypoglycemia, heartburn or nausea She has been a bit more constipated She celebrated holidays and went out to eat more with her daughters She is working on improving water  intake She is doing water  PT 2 x a week She hasn't been doing any exercise on her own Hunger and cravings have improved  Pharmacotherapy: Zepbound  5 mg weekly  PHYSICAL EXAM:  Blood pressure 110/73, pulse 75, height 5' 3 (1.6 m), weight 202 lb (91.6 kg), SpO2 98%. Body mass index is 35.78 kg/m.  General: She is overweight, cooperative, alert, well developed, and in no acute distress. PSYCH: Has normal mood, affect and thought process.   Lungs: Normal breathing effort, no conversational dyspnea.  ASSESSMENT AND PLAN  TREATMENT PLAN FOR OBESITY:  Recommended Dietary Goals  Kristine Olson is currently in the action stage of change. As such, her goal is to continue weight management plan. She has agreed to keeping a food journal and adhering to recommended goals of 1200 calories and 80 g of  protein and practicing portion control and making smarter food choices, such as increasing vegetables and decreasing simple  carbohydrates.  Behavioral Intervention  We discussed the following Behavioral Modification Strategies today: increasing lean protein intake to established goals, increasing fiber rich foods, increasing water  intake , work on meal planning and preparation, keeping healthy foods at home, identifying sources and decreasing liquid calories, and continue to practice mindfulness when eating.  Additional resources provided today: NA  Recommended Physical Activity Goals  Kristine Olson has been advised to work up to 150 minutes of moderate intensity aerobic activity a week and strengthening exercises 2-3 times per week for cardiovascular health, weight loss maintenance and preservation of muscle mass.   She has agreed to Think about enjoyable ways to increase daily physical activity and overcoming barriers to exercise and Increase physical activity in their day and reduce sedentary time (increase NEAT). Water  exercise goal: 3 days/ wk  Pharmacotherapy changes for the treatment of obesity: none  ASSOCIATED CONDITIONS ADDRESSED TODAY  OSA on CPAP -     Zepbound ; Inject 5 mg into the skin once a week.  Dispense: 2 mL; Refill: 0 Improving weight loss/ appetite control on Zepbound  5 mg weekly Continue use of CPAP nightly and aim for 8 hrs of sleep at night  Class 2 obesity due to excess calories with body mass index (BMI) of 35.0 to 35.9 in adult, unspecified whether serious comorbidity present Improving Patient was counseled on the importance of maintaining healthy lifestyle habits, including balanced nutrition, regular physical activity, and behavioral modifications, while taking antiobesity medication.  Patient  verbalized understanding that medication is an adjunct to, not a replacement for, lifestyle changes and that the long-term success and weight maintenance depend on continued adherence to these strategies.  Polyarthritis Unchanged In water  therapy Lacking motivation to ramp up exercise at the  gym Reviewed labs/ notes from Dr Dollene She has concerns about her high rheumatoid factor  Pre-diabetes Lab Results  Component Value Date   HGBA1C 5.7 (H) 06/24/2024   Plan to repeat in the next 3-4 mos Limit sugar intake     She was informed of the importance of frequent follow up visits to maximize her success with intensive lifestyle modifications for her multiple health conditions.   ATTESTASTION STATEMENTS:  Reviewed by clinician on day of visit: allergies, medications, problem list, medical history, surgical history, family history, social history, and previous encounter notes pertinent to obesity diagnosis.   I have personally spent 30 minutes total time today in preparation, patient care, nutritional counseling and education,  and documentation for this visit, including the following: review of most recent clinical lab tests, prescribing medications/ refilling medications, reviewing medical assistant documentation, review and interpretation of bioimpedence results.     Kristine Olson, D.O. DABFM, DABOM [facility] [address] [place], [place] [address] [phone] "

## 2024-11-12 ENCOUNTER — Encounter (HOSPITAL_BASED_OUTPATIENT_CLINIC_OR_DEPARTMENT_OTHER): Payer: Self-pay | Admitting: Physical Therapy

## 2024-11-12 ENCOUNTER — Ambulatory Visit (HOSPITAL_BASED_OUTPATIENT_CLINIC_OR_DEPARTMENT_OTHER): Payer: Self-pay | Admitting: Physical Therapy

## 2024-11-12 DIAGNOSIS — R293 Abnormal posture: Secondary | ICD-10-CM

## 2024-11-12 DIAGNOSIS — M5459 Other low back pain: Secondary | ICD-10-CM

## 2024-11-12 DIAGNOSIS — G8929 Other chronic pain: Secondary | ICD-10-CM

## 2024-11-12 NOTE — Therapy (Signed)
 " OUTPATIENT PHYSICAL THERAPY THORACOLUMBAR TREATMENT   Patient Name: Kristine Olson MRN: [id number] DOB:10/23/1946, 79 y.o., female Today's Date: 11/12/2024  END OF SESSION:  PT End of Session - 11/12/24 1448     Visit Number 6    Date for Recertification  12/19/24    Authorization Type [employer]    Progress Note Due on Visit 10    PT Start Time 1448    PT Stop Time 1527    PT Time Calculation (min) 39 min    Activity Tolerance Patient tolerated treatment well    Behavior During Therapy WFL for tasks assessed/performed            Past Medical History:  Diagnosis Date   Acute pyloric channel ulcer with hemorrhage on Xarelto     Anemia    history of anemia after bleeding ulcer   Arthritis    ostearthritis - right  medial knee   Back pain    Cataract    beginning cataracts   Complication of anesthesia    was given a medication while waiting to go to surgery that caused burning sensation on scalp was given another medication and the burning went away   COVID-19 2022   Depression    Edema    GERD (gastroesophageal reflux disease)    heartburn daily-TUMS helpful   Hemorrhoid    History of blood transfusion    2 units PRBC   History of stomach ulcers    Hx of sinus tachycardia    rarely any problems since metoprolol  use   Hyperlipidemia    patient unaware, has never taking medication   Hypertension    Hypothyroidism    Joint pain    Osteoporosis    osteopenia   Sleep apnea    uses c pap   SOB (shortness of breath)    Varicose vein of leg    Vitamin D  deficiency    Past Surgical History:  Procedure Laterality Date   BIOPSY BREAST     2 biopsies & 2 aspirations   CESAREAN SECTION     CLOSED REDUCTION TOE FRACTURE     COLONOSCOPY  2015   COLONOSCOPY W/ POLYPECTOMY  multiple since 2007   adenomas, diverticulosis;last 2015   ESOPHAGOGASTRODUODENOSCOPY (EGD) WITH PROPOFOL  N/A 05/29/2016   Procedure: ESOPHAGOGASTRODUODENOSCOPY (EGD) WITH PROPOFOL ;   Surgeon: Lupita FORBES Commander, MD;  Location: [facility];  Service: Endoscopy;  Laterality: N/A;   HAMMER TOE SURGERY     JOINT REPLACEMENT     PARTIAL KNEE ARTHROPLASTY Right 05/22/2016   Procedure: RIGHT KNEE MEDIAL UNICOMPARTMENTAL ARTHROPLASTY;  Surgeon: Dempsey Moan, MD;  Location: [facility];  Service: Orthopedics;  Laterality: Right;   TONSILLECTOMY AND ADENOIDECTOMY     TOTAL KNEE ARTHROPLASTY Left 08/27/2017   Procedure: LEFT TOTAL KNEE ARTHROPLASTY;  Surgeon: Moan Dempsey, MD;  Location: [facility];  Service: Orthopedics;  Laterality: Left;   WISDOM TOOTH EXTRACTION     Patient Active Problem List   Diagnosis Date Noted   Positive ANA (antinuclear antibody) 07/16/2024   Bilateral hand pain 03/26/2024   Morbid obesity (HCC) with starting BMI 39 07/03/2023   B12 deficiency 10/26/2022   Insulin  resistance 10/26/2022   Pre-diabetes 10/26/2022   Other fatigue 10/12/2022   SOBOE (shortness of breath on exertion) 10/12/2022   Depression screen 10/12/2022   Essential hypertension 09/11/2022   OSA on CPAP 09/11/2022   Anemia, iron deficiency 05/29/2016   OA (osteoarthritis) of knee 05/22/2016   Physical exam 04/17/2016  Hemorrhoids 12/03/2015   Elevated serum creatinine 11/27/2014   Positional vertigo 10/18/2012   Fasting hyperglycemia 08/23/2012   Vitamin D  deficiency 08/23/2012   Osteopenia 07/06/2011   Menopausal and postmenopausal disorder 03/01/2010   GERD (gastroesophageal reflux disease) 03/05/2009   Hypothyroidism 11/20/2007   IBS 11/20/2007   History of cardiovascular disorder 11/20/2007   History of colonic polyps 11/20/2007    PCP: Comer Greet MD  REFERRING PROVIDER: Carilyn Prentice BRAVO, MD   REFERRING DIAG: G89.28 (ICD-10-CM) - Chronic post-operative pain   Rationale for Evaluation and Treatment: Rehabilitation  THERAPY DIAG:  Other low back pain  Abnormal posture  Bilateral chronic knee pain  ONSET DATE: 5 yrs  SUBJECTIVE:                                                                                                                                                                                            SUBJECTIVE STATEMENT: Stood for 20 minutes + the other day was hurting but I was able to do it (6/10), sat for 10 minutes then was better.   Today knees are still tight vs painful    PERTINENT HISTORY:  L TKR 2018 R partial 2017  PAIN:  Are you having pain? no: NPRS scale: 0/10 current Pain location:  Pain description: tight, stiff Aggravating factors: walking, grocery shopping Relieving factors: sitting; occasionally Tylenol ; rest  PRECAUTIONS: None  RED FLAGS: None   WEIGHT BEARING RESTRICTIONS: No  FALLS:  Has patient fallen in last 6 months? No   OCCUPATION: retired  PLOF: Independent  PATIENT GOALS: walk through grocery store; travel  NEXT MD VISIT: as needed  OBJECTIVE:  Note: Objective measures were completed at Evaluation unless otherwise noted.  DIAGNOSTIC FINDINGS:  None recent  PATIENT SURVEYS:  LEFs: 39/80 ODI: 15/50=30%  COGNITION: Overall cognitive status: Within functional limits for tasks assessed     SENSATION: WFL  MUSCLE LENGTH: Hamstrings: WFL tested in sitting   POSTURE:  -Thoracolumbar scoliosis with right dextroscoliosis and pelvic obliquity  -Left leg is one centimeter longer, likely due to left knee replacement and scoliosis    PALPATION: Mild TTP lumbar paraspinals  LUMBAR ROM:   wfl  LOWER EXTREMITY ROM:     WFL  LOWER EXTREMITY MMT:    MMT Right eval Left eval  Hip flexion 3 3+  Hip extension    Hip abduction 5 5  Hip adduction 5 5  Hip internal rotation    Hip external rotation    Knee flexion 5 5  Knee extension 4(HS cramping) 5  Ankle dorsiflexion 5 5  Ankle plantarflexion 5 5  Ankle inversion  Ankle eversion     (Blank rows = not tested)  LUMBAR SPECIAL TESTS:  FABER's: Negative Distraction (supine): Negative Thigh thrust test:  Negative  FUNCTIONAL TESTS:  5 times sit to stand: 12.35 Timed up and go (TUG): 10.14 4 stage balance: Passed 1&2. Tandem assistance needed to gain position hold x 5s                             SLS: unable  GAIT: Distance walked: 500 ft Assistive device utilized: None Level of assistance: Complete Independence Comments: Normal. Initial gait pt reports is antalgic in am or after sitting for long periods of time  TREATMENT  [facility] Adult PT Treatment:                                                DATE: 11/12/24 Pt seen for aquatic therapy today.  Treatment took place in water  3.5-4.75 ft in depth at the [facility] pool. Temp of water  was 91.  Pt entered/exited the pool via stairs using step to pattern with hand rail.  (Feels best facing lap pool)  *unsupported: walking/marching forward and backward, multiple laps with cues for elevated knee/increased flex *side stepping with arm add/abdct -> with rainbow hand floats  *stretching: quad/hamstring/gastroc on 2nd step *UE support yellow HB: toe/heel raises; hip add/abd x 10 each; hip flex/ext x10; relaxed squats x 10 *tandem stance ue support yellow HB leading R/L. Pt requires extra time to gain motor plan, once established she is able to hold x 20s->tandem walking forward then back   Pt requires the buoyancy and hydrostatic pressure of water  for support, and to offload joints by unweighting joint load by at least 50 % in navel deep water  and by at least 75-80% in chest to neck deep water .  Viscosity of the water  is needed for resistance of strengthening. Water  current perturbations provides challenge to standing balance requiring increased core activation.       PATIENT EDUCATION:  Education details: intro to aquatic therapy Person educated: Patient Education method: Explanation Education comprehension: verbalized understanding  HOME EXERCISE PROGRAM: Aquatics TBA  3-layer heel lift in Rt shoe (10/25/24)  Access Code:  [email or web address] URL: [email or web address] Date: 10/25/2024 Prepared by: Harlene Cordon  Exercises - Supine Hip Adduction Isometric with Ball  - 1 x daily - 7 x weekly - 1 sets - 10 reps - 3 breaths hold - Supine Bridge with Mini Swiss Ball Between Knees  - 1 x daily - 7 x weekly - 3 sets - 10 reps - Sidelying Hip Abduction  - 1 x daily - 7 x weekly - 3 sets - 10 reps - Sidelying Hip Circles  - 1 x daily - 7 x weekly - 3 sets - 10 reps - supine SLR 3*10  ASSESSMENT:  CLINICAL IMPRESSION: Pt reports she has not yet tried to dial back reps of land exercises instructed prior but will work on going forward.  She is instructed on HS, gastroc and quad stretching on steps at home.  She VU of 2-3 times a day as able holding for ~20s for optimal stretch.  Worked balance and proprioception feed back today with good toleration.  Pt able to gain motor plans and execution of balance after extra time spent working on skill. Pt reports improvement in activity toleration  with extended standing time. Goals ongoing        EVAL: Patient is a 79 y.o. f who was seen today for physical therapy evaluation and treatment for post operative bilateral knee pain of which have both had surgical intervention in past 10 years. She also complains of LBP which radiates into her left hip. There are no recent xrays in chart but has been dx with a mild thoracolumbar right dextroscoliosis and pelvic obliquity in 2023 which appears to have progressed causing increased pain sensitivity and reduction in toleration to activity. Her objective testing as completed today are falling [facility] other than some minor balance difficulties. She reports decreased ability to walk through grocery stores and airports. She has become mostly sedentary as time has progressed as she has no pain in sitting and reports embarrassment for needing AD's or extra assistance. Pt will benefit from skilled PT intervention in both aquatics and land to  improve core strength, improve posture, balance, manage pain and return pt to a routine exercises regimen.  Pt scheduled to be seen on land next session. Please assess need/benefit for lift in shoe and initiate HEP addressing pelvic obliquity and back strengthening/stretching.  OBJECTIVE IMPAIRMENTS: decreased activity tolerance, decreased balance, decreased knowledge of use of DME, difficulty walking, postural dysfunction, obesity, and pain.   ACTIVITY LIMITATIONS: carrying, lifting, squatting, and locomotion level  PARTICIPATION LIMITATIONS: shopping, community activity, and yard work  PERSONAL FACTORS: Fitness and Time since onset of injury/illness/exacerbation are also affecting patient's functional outcome.   REHAB POTENTIAL: Good  CLINICAL DECISION MAKING: Stable/uncomplicated  EVALUATION COMPLEXITY: Moderate   GOALS: Goals reviewed with patient? Yes  SHORT TERM GOALS: Target date: 11/21/24  Pt will tolerate full aquatic sessions consistently without increase in pain and with improving function to demonstrate good toleration and effectiveness of intervention.  Baseline: Goal status: Met 11/10/24  2.  Pt to trial shoe lift as deemed approp by land based therapist for reduction in Left hip pain Baseline:  Goal status: Met 11/10/24  3.  Pt will report returning to [place] and/or return to consistent exercise regimen Baseline: stopped x 1 yr Goal status: INITIAL    LONG TERM GOALS: Target date: 12/19/24  Pt to improve on LEFS by at least 9 point to demonstrate statistically significant Improvement in function. Baseline: 39/80 Goal status: INITIAL  2.  Pt to improve on ODI by  13% to demonstrate statistically significant Improvement in function. (MCID 13-15%) Baseline:15/50=30% Goal status: INITIAL  3.  Pt will report completing full grocery shop without limitation to pain. Baseline: unable Goal status: INITIAL  4.  Pt will report no hesitation with traveling with or  without modifications for toleration Baseline:  Goal status: INITIAL  5.  Pt will perform tandem and SL stance x 10s demonstrating improved balance ability. Baseline:  Goal status: INITIAL  6.  Pt will be indep with final HEP's (land and aquatic as appropriate) for continued management of condition Baseline:  Goal status: INITIAL  PLAN:  PT FREQUENCY: 1-2x/week  PT DURATION: 8 weeks  PLANNED INTERVENTIONS: [id number]- PT Re-evaluation, 97750- Physical Performance Testing, 97110-Therapeutic exercises, 97530- Therapeutic activity, 97112- Neuromuscular re-education, 97535- Self Care, [id number]- Manual therapy, [id number]- Gait training, [phone]- Orthotic Initial, [id number]- Aquatic Therapy, [phone]- Electrical stimulation (unattended), [phone]- Electrical stimulation (manual), [id number]- Ionotophoresis 4mg /ml Dexamethasone , 79439 (1-2 muscles), [id number] (3+ muscles)- Dry Needling, Patient/Family education, Balance training, Stair training, Taping, Joint mobilization, DME instructions, Cryotherapy, and Moist heat.  PLAN FOR NEXT SESSION: Land based: assess for shoe lift;  Hep for core stretching and strengthening Aquatics: postural stretching and strengthening, toleration to activity, pain management- try to work with Rt foot uphill to make up for lack of lift while in pool   [place] ([place]) Jarquis Walker MPT 11/12/24 2:49 PM [facility] [address] [place], [place], [address] Phone: [phone]   Fax:  [phone]      Referring diagnosis? G89.28 (ICD-10-CM) - Chronic post-operative pain  Treatment diagnosis? (if different than referring diagnosis) other LBP; knee pain; muscle weakness What was this (referring dx) caused by? []  Surgery []  Fall [x]  Ongoing issue []  Arthritis []  Other: ____________  Laterality: []  Rt []  Lt [x]  Both  Check all possible CPT codes:  *CHOOSE 10 OR LESS*    See Planned Interventions listed in the Plan section of the Evaluation.     "

## 2024-11-18 ENCOUNTER — Encounter: Admitting: Physical Medicine & Rehabilitation

## 2024-11-21 ENCOUNTER — Ambulatory Visit (HOSPITAL_BASED_OUTPATIENT_CLINIC_OR_DEPARTMENT_OTHER): Admitting: Physical Therapy

## 2024-11-24 ENCOUNTER — Ambulatory Visit (HOSPITAL_BASED_OUTPATIENT_CLINIC_OR_DEPARTMENT_OTHER): Admitting: Physical Therapy

## 2024-11-26 ENCOUNTER — Ambulatory Visit: Admitting: Family Medicine

## 2024-11-26 ENCOUNTER — Encounter (HOSPITAL_BASED_OUTPATIENT_CLINIC_OR_DEPARTMENT_OTHER): Payer: Self-pay | Admitting: Physical Therapy

## 2024-11-26 ENCOUNTER — Ambulatory Visit (HOSPITAL_BASED_OUTPATIENT_CLINIC_OR_DEPARTMENT_OTHER): Admitting: Physical Therapy

## 2024-11-26 ENCOUNTER — Encounter: Payer: Self-pay | Admitting: Family Medicine

## 2024-11-26 VITALS — BP 110/58 | HR 76 | Temp 98.3°F | Ht 63.0 in | Wt 204.8 lb

## 2024-11-26 DIAGNOSIS — R2681 Unsteadiness on feet: Secondary | ICD-10-CM

## 2024-11-26 DIAGNOSIS — J3489 Other specified disorders of nose and nasal sinuses: Secondary | ICD-10-CM

## 2024-11-26 DIAGNOSIS — R293 Abnormal posture: Secondary | ICD-10-CM

## 2024-11-26 DIAGNOSIS — R7689 Other specified abnormal immunological findings in serum: Secondary | ICD-10-CM

## 2024-11-26 DIAGNOSIS — G8929 Other chronic pain: Secondary | ICD-10-CM

## 2024-11-26 DIAGNOSIS — M5459 Other low back pain: Secondary | ICD-10-CM

## 2024-11-26 DIAGNOSIS — H5789 Other specified disorders of eye and adnexa: Secondary | ICD-10-CM

## 2024-11-26 MED ORDER — PREDNISONE 10 MG PO TABS: ORAL_TABLET | ORAL | 0 refills | Status: AC

## 2024-11-26 NOTE — Therapy (Signed)
 " OUTPATIENT PHYSICAL THERAPY THORACOLUMBAR TREATMENT   Patient Name: Kristine Olson MRN: [id number] DOB:[birth year], 79 y.o., female Today's Date: 11/26/2024  END OF SESSION:  PT End of Session - 11/26/24 1021     Visit Number 7    Date for Recertification  12/19/24    Authorization Type [employer]    Progress Note Due on Visit 10    PT Start Time 1015    PT Stop Time 1055    PT Time Calculation (min) 40 min    Activity Tolerance Patient tolerated treatment well    Behavior During Therapy WFL for tasks assessed/performed            Past Medical History:  Diagnosis Date   Acute pyloric channel ulcer with hemorrhage on Xarelto     Anemia    history of anemia after bleeding ulcer   Arthritis    ostearthritis - right  medial knee   Back pain    Cataract    beginning cataracts   Complication of anesthesia    was given a medication while waiting to go to surgery that caused burning sensation on scalp was given another medication and the burning went away   COVID-19 2022   Depression    Edema    GERD (gastroesophageal reflux disease)    heartburn daily-TUMS helpful   Hemorrhoid    History of blood transfusion    2 units PRBC   History of stomach ulcers    Hx of sinus tachycardia    rarely any problems since metoprolol  use   Hyperlipidemia    patient unaware, has never taking medication   Hypertension    Hypothyroidism    Joint pain    Osteoporosis    osteopenia   Sleep apnea    uses c pap   SOB (shortness of breath)    Varicose vein of leg    Vitamin D  deficiency    Past Surgical History:  Procedure Laterality Date   BIOPSY BREAST     2 biopsies & 2 aspirations   CESAREAN SECTION     CLOSED REDUCTION TOE FRACTURE     COLONOSCOPY  2015   COLONOSCOPY W/ POLYPECTOMY  multiple since 2007   adenomas, diverticulosis;last 2015   ESOPHAGOGASTRODUODENOSCOPY (EGD) WITH PROPOFOL  N/A 05/29/2016   Procedure: ESOPHAGOGASTRODUODENOSCOPY (EGD) WITH PROPOFOL ;   Surgeon: Lupita FORBES Commander, MD;  Location: [facility];  Service: Endoscopy;  Laterality: N/A;   HAMMER TOE SURGERY     JOINT REPLACEMENT     PARTIAL KNEE ARTHROPLASTY Right 05/22/2016   Procedure: RIGHT KNEE MEDIAL UNICOMPARTMENTAL ARTHROPLASTY;  Surgeon: Dempsey Moan, MD;  Location: [facility];  Service: Orthopedics;  Laterality: Right;   TONSILLECTOMY AND ADENOIDECTOMY     TOTAL KNEE ARTHROPLASTY Left 08/27/2017   Procedure: LEFT TOTAL KNEE ARTHROPLASTY;  Surgeon: Moan Dempsey, MD;  Location: [facility];  Service: Orthopedics;  Laterality: Left;   WISDOM TOOTH EXTRACTION     Patient Active Problem List   Diagnosis Date Noted   Positive ANA (antinuclear antibody) 07/16/2024   Bilateral hand pain 03/26/2024   Morbid obesity (HCC) with starting BMI 39 07/03/2023   B12 deficiency 10/26/2022   Insulin  resistance 10/26/2022   Pre-diabetes 10/26/2022   Other fatigue 10/12/2022   SOBOE (shortness of breath on exertion) 10/12/2022   Depression screen 10/12/2022   Essential hypertension 09/11/2022   OSA on CPAP 09/11/2022   Anemia, iron deficiency 05/29/2016   OA (osteoarthritis) of knee 05/22/2016   Physical exam 04/17/2016  Hemorrhoids 12/03/2015   Elevated serum creatinine 11/27/2014   Positional vertigo 10/18/2012   Fasting hyperglycemia 08/23/2012   Vitamin D  deficiency 08/23/2012   Osteopenia 07/06/2011   Menopausal and postmenopausal disorder 03/01/2010   GERD (gastroesophageal reflux disease) 03/05/2009   Hypothyroidism 11/20/2007   IBS 11/20/2007   History of cardiovascular disorder 11/20/2007   History of colonic polyps 11/20/2007    PCP: Comer Greet MD  REFERRING PROVIDER: Carilyn Prentice BRAVO, MD   REFERRING DIAG: G89.28 (ICD-10-CM) - Chronic post-operative pain   Rationale for Evaluation and Treatment: Rehabilitation  THERAPY DIAG:  Other low back pain  Abnormal posture  Bilateral chronic knee pain  Unsteadiness on feet  ONSET DATE: 5 yrs  SUBJECTIVE:                                                                                                                                                                                            SUBJECTIVE STATEMENT: I do think this is helping.  I had to miss last week due to the weather.   Pt reports the Rt heel lift is helping.  I just feel like I have a vice on my Rt knee.    PERTINENT HISTORY:  L TKR 2018 R partial 2017  PAIN:  Are you having pain? no: NPRS scale: 0/10 current  - just tightness Pain location:  Pain description: tight, stiff Aggravating factors: walking, grocery shopping Relieving factors: sitting; occasionally Tylenol ; rest  PRECAUTIONS: None  RED FLAGS: None   WEIGHT BEARING RESTRICTIONS: No  FALLS:  Has patient fallen in last 6 months? No   OCCUPATION: retired  PLOF: Independent  PATIENT GOALS: walk through grocery store; travel  NEXT MD VISIT: as needed  OBJECTIVE:  Note: Objective measures were completed at Evaluation unless otherwise noted.  DIAGNOSTIC FINDINGS:  None recent  PATIENT SURVEYS:  LEFs: 39/80 ODI: 15/50=30%  COGNITION: Overall cognitive status: Within functional limits for tasks assessed     SENSATION: WFL  MUSCLE LENGTH: Hamstrings: WFL tested in sitting   POSTURE:  -Thoracolumbar scoliosis with right dextroscoliosis and pelvic obliquity  -Left leg is one centimeter longer, likely due to left knee replacement and scoliosis    PALPATION: Mild TTP lumbar paraspinals  LUMBAR ROM:   wfl  LOWER EXTREMITY ROM:     WFL  LOWER EXTREMITY MMT:    MMT Right eval Left eval  Hip flexion 3 3+  Hip extension    Hip abduction 5 5  Hip adduction 5 5  Hip internal rotation    Hip external rotation    Knee flexion 5 5  Knee extension 4(HS cramping) 5  Ankle dorsiflexion 5 5  Ankle plantarflexion 5 5  Ankle inversion    Ankle eversion     (Blank rows = not tested)  LUMBAR SPECIAL TESTS:  FABER's:  Negative Distraction (supine): Negative Thigh thrust test: Negative  FUNCTIONAL TESTS:  5 times sit to stand: 12.35 Timed up and go (TUG): 10.14 4 stage balance: Passed 1&2. Tandem assistance needed to gain position hold x 5s                             SLS: unable  GAIT: Distance walked: 500 ft Assistive device utilized: None Level of assistance: Complete Independence Comments: Normal. Initial gait pt reports is antalgic in am or after sitting for long periods of time  TREATMENT  [facility] Adult PT Treatment:                                                DATE: 11/26/24 Pt seen for aquatic therapy today.  Treatment took place in water  3.5-4.75 ft in depth at the [facility] pool. Temp of water  was 91.  Pt entered/exited the pool via stairs using step to pattern with hand rail.  (Feels best facing lap pool)  *unsupported: walking/marching forward and backward, multiple laps with cues for elevated knee/increased flex *side stepping with arm add/abdct -> with rainbow hand floats -> yellow hand floats * tandem gait forward/backward with UE On yellow hand floats-> hands out of water   *UE support yellow HB: toe/heel raises; hip add/abd x 10 each; 3 way LE kick 2x 5 each LE * walking forward/ backward for recovery -> forward walking kick * squats pushing single rainbow hand float under water -> yellow hand float * (hands out of water ) SLS x 15sec x 2 reps each LE ; tandem stance x 15 sec x 2 reps each LE forward *stretching: R/Lquad with foot on 2nd step behind her x 20 sec each; R/L hamstring stretch with heel on 2nd step x 20 sec each LE; bil gastroc stretch with heels off of 1st step    Pt requires the buoyancy and hydrostatic pressure of water  for support, and to offload joints by unweighting joint load by at least 50 % in navel deep water  and by at least 75-80% in chest to neck deep water .  Viscosity of the water  is needed for resistance of strengthening. Water  current perturbations  provides challenge to standing balance requiring increased core activation.    PATIENT EDUCATION:  Education details: intro to aquatic therapy, progressions and modifications Person educated: Patient Education method: Explanation Education comprehension: verbalized understanding  HOME EXERCISE PROGRAM: Aquatics TBA  3-layer heel lift in Rt shoe (10/25/24)  Access Code: [email or web address] URL: [email or web address] Date: 10/25/2024 Prepared by: Harlene Cordon  Exercises - Supine Hip Adduction Isometric with Ball  - 1 x daily - 7 x weekly - 1 sets - 10 reps - 3 breaths hold - Supine Bridge with Mini Swiss Ball Between Knees  - 1 x daily - 7 x weekly - 3 sets - 10 reps - Sidelying Hip Abduction  - 1 x daily - 7 x weekly - 3 sets - 10 reps - Sidelying Hip Circles  - 1 x daily - 7 x weekly - 3 sets - 10 reps - supine SLR 3*10  ASSESSMENT:  CLINICAL IMPRESSION:  Pt reporting improvement  in activity toleration with extended standing time. Good tolerance for exercises completed today without increase in symptoms.  She was able to progress balance exercises to completing them with hands out of the water  without much difficulty.  Pt is progressing well towards established goals. Plan to ck SLS and tandem stance time next session (on land) as time allows (LTG 5).         EVAL: Patient is a 79 y.o. f who was seen today for physical therapy evaluation and treatment for post operative bilateral knee pain of which have both had surgical intervention in past 10 years. She also complains of LBP which radiates into her left hip. There are no recent xrays in chart but has been dx with a mild thoracolumbar right dextroscoliosis and pelvic obliquity in 2023 which appears to have progressed causing increased pain sensitivity and reduction in toleration to activity. Her objective testing as completed today are falling [facility] other than some minor balance difficulties. She reports decreased ability to  walk through grocery stores and airports. She has become mostly sedentary as time has progressed as she has no pain in sitting and reports embarrassment for needing AD's or extra assistance. Pt will benefit from skilled PT intervention in both aquatics and land to improve core strength, improve posture, balance, manage pain and return pt to a routine exercises regimen.  Pt scheduled to be seen on land next session. Please assess need/benefit for lift in shoe and initiate HEP addressing pelvic obliquity and back strengthening/stretching.  OBJECTIVE IMPAIRMENTS: decreased activity tolerance, decreased balance, decreased knowledge of use of DME, difficulty walking, postural dysfunction, obesity, and pain.   ACTIVITY LIMITATIONS: carrying, lifting, squatting, and locomotion level  PARTICIPATION LIMITATIONS: shopping, community activity, and yard work  PERSONAL FACTORS: Fitness and Time since onset of injury/illness/exacerbation are also affecting patient's functional outcome.   REHAB POTENTIAL: Good  CLINICAL DECISION MAKING: Stable/uncomplicated  EVALUATION COMPLEXITY: Moderate   GOALS: Goals reviewed with patient? Yes  SHORT TERM GOALS: Target date: 11/21/24  Pt will tolerate full aquatic sessions consistently without increase in pain and with improving function to demonstrate good toleration and effectiveness of intervention.  Baseline: Goal status: Met 11/10/24  2.  Pt to trial shoe lift as deemed approp by land based therapist for reduction in Left hip pain Baseline:  Goal status: Met 11/10/24  3.  Pt will report returning to [facility] and/or return to consistent exercise regimen Baseline: Goal status:Partially met - rejoined but hasn't returned yet. 11/26/24    LONG TERM GOALS: Target date: 12/19/24  Pt to improve on LEFS by at least 9 point to demonstrate statistically significant Improvement in function. Baseline: 39/80 Goal status: INITIAL  2.  Pt to improve on ODI by  13%  to demonstrate statistically significant Improvement in function. (MCID 13-15%) Baseline:15/50=30% Goal status: INITIAL  3.  Pt will report completing full grocery shop without limitation to pain. Baseline: unable Goal status: INITIAL  4.  Pt will report no hesitation with traveling with or without modifications for toleration Baseline:  Goal status: INITIAL  5.  Pt will perform tandem and SL stance x 10s demonstrating improved balance ability. Baseline:  Goal status: INITIAL  6.  Pt will be indep with final HEP's (land and aquatic as appropriate) for continued management of condition Baseline:  Goal status: INITIAL  PLAN:  PT FREQUENCY: 1-2x/week  PT DURATION: 8 weeks  PLANNED INTERVENTIONS: 97164- PT Re-evaluation, 97750- Physical Performance Testing, 97110-Therapeutic exercises, 97530- Therapeutic activity, [id number]- Neuromuscular re-education, 97535-  Self Care, 02859- Manual therapy, [id number]- Gait training, [phone]- Orthotic Initial, [phone]- Aquatic Therapy, [phone]- Electrical stimulation (unattended), [phone]- Electrical stimulation (manual), [id number]- Ionotophoresis 4mg /ml Dexamethasone , 20560 (1-2 muscles), 20561 (3+ muscles)- Dry Needling, Patient/Family education, Balance training, Stair training, Taping, Joint mobilization, DME instructions, Cryotherapy, and Moist heat.  PLAN FOR NEXT SESSION: Land based: assess for shoe lift; Hep for core stretching and strengthening Aquatics: postural stretching and strengthening, toleration to activity, pain management- try to work with Rt foot uphill to make up for lack of lift while in pool   [place], [place] 11/26/24 10:57 AM [facility] [address] [place], [place], [address] Phone: [phone]   Fax:  [phone]     Referring diagnosis? G89.28 (ICD-10-CM) - Chronic post-operative pain  Treatment diagnosis? (if different than referring diagnosis) other LBP; knee pain;  muscle weakness What was this (referring dx) caused by? []  Surgery []  Fall [x]  Ongoing issue []  Arthritis []  Other: ____________  Laterality: []  Rt []  Lt [x]  Both  Check all possible CPT codes:  *CHOOSE 10 OR LESS*    See Planned Interventions listed in the Plan section of the Evaluation.    "

## 2024-11-26 NOTE — Progress Notes (Unsigned)
" ° °  Subjective:    Patient ID: Kristine Olson, female    DOB: 29-Nov-1945, 79 y.o.   MRN: [id number]  HPI Sinus- L sided.  HA, tooth pain.  Has pain w/ looking down or leaning forward.  Denies nasal congestion, drainage.  Red eye- L sided.  Started in October.  Has been to eye doctor multiple times.  Used steroid eye drops w/o relief.  Stopped steroids ~1 week ago and subsequently developed pain.  Eye doctor recommended she come here for systemic steroids.  + RF- saw Rheum on 1/12 and Rheumatoid Factor elevated at 49.1  No meds were started nor plan discussed other than return in 6 months.   Review of Systems For ROS see HPI     Objective:   Physical Exam        Assessment & Plan:    "

## 2024-11-26 NOTE — Patient Instructions (Signed)
 Follow up as needed or as scheduled START the Prednisone  as directed- 3 pills at the same time x3 days, then 2 pills at the same time x3 days, then 1 pill daily.  Take w/ food  Call Dr Dollene and determine next steps regarding that Rheumatoid factor Call with any questions or concerns Hang in there!!!

## 2024-11-27 ENCOUNTER — Telehealth: Payer: Self-pay

## 2024-11-27 NOTE — Telephone Encounter (Signed)
 Called patient back and went over directions.

## 2024-11-27 NOTE — Telephone Encounter (Signed)
 Copied from CRM #[id number]. Topic: Clinical - Medication Question >> Nov 27, 2024 11:46 AM Sophia H wrote: Reason for CRM: Patient is needing clarification on how to take medication - predniSONE  (DELTASONE ) 10 MG tablet  Please reach out # [phone]

## 2024-12-02 ENCOUNTER — Ambulatory Visit (HOSPITAL_BASED_OUTPATIENT_CLINIC_OR_DEPARTMENT_OTHER): Admitting: Physical Therapy

## 2024-12-04 ENCOUNTER — Ambulatory Visit: Admitting: Family Medicine

## 2024-12-05 ENCOUNTER — Ambulatory Visit (HOSPITAL_BASED_OUTPATIENT_CLINIC_OR_DEPARTMENT_OTHER): Admitting: Physical Therapy

## 2024-12-09 ENCOUNTER — Ambulatory Visit: Admitting: Family Medicine

## 2024-12-09 ENCOUNTER — Ambulatory Visit (HOSPITAL_BASED_OUTPATIENT_CLINIC_OR_DEPARTMENT_OTHER): Admitting: Physical Therapy

## 2024-12-12 ENCOUNTER — Ambulatory Visit (HOSPITAL_BASED_OUTPATIENT_CLINIC_OR_DEPARTMENT_OTHER): Admitting: Physical Therapy

## 2024-12-16 ENCOUNTER — Ambulatory Visit (HOSPITAL_BASED_OUTPATIENT_CLINIC_OR_DEPARTMENT_OTHER): Admitting: Physical Therapy

## 2024-12-18 ENCOUNTER — Encounter: Admitting: Physical Medicine & Rehabilitation

## 2024-12-18 ENCOUNTER — Ambulatory Visit: Admitting: Family Medicine

## 2024-12-19 ENCOUNTER — Ambulatory Visit (HOSPITAL_BASED_OUTPATIENT_CLINIC_OR_DEPARTMENT_OTHER): Admitting: Physical Therapy

## 2025-07-14 ENCOUNTER — Encounter
